# Patient Record
Sex: Female | Born: 1965 | Race: White | Hispanic: No | Marital: Married | Smoking: Never smoker
Health system: Southern US, Community
[De-identification: ages and names within clinical notes are randomized; demographics above are authoritative.]

## PROBLEM LIST (undated history)

## (undated) DIAGNOSIS — T7840XA Allergy, unspecified, initial encounter: Secondary | ICD-10-CM

## (undated) DIAGNOSIS — R519 Headache, unspecified: Secondary | ICD-10-CM

## (undated) DIAGNOSIS — K219 Gastro-esophageal reflux disease without esophagitis: Secondary | ICD-10-CM

## (undated) DIAGNOSIS — R51 Headache: Secondary | ICD-10-CM

## (undated) DIAGNOSIS — I10 Essential (primary) hypertension: Secondary | ICD-10-CM

## (undated) DIAGNOSIS — Z87442 Personal history of urinary calculi: Secondary | ICD-10-CM

## (undated) DIAGNOSIS — R7303 Prediabetes: Secondary | ICD-10-CM

## (undated) DIAGNOSIS — F419 Anxiety disorder, unspecified: Secondary | ICD-10-CM

## (undated) DIAGNOSIS — F32A Depression, unspecified: Secondary | ICD-10-CM

## (undated) DIAGNOSIS — Z800 Family history of malignant neoplasm of digestive organs: Secondary | ICD-10-CM

## (undated) DIAGNOSIS — N200 Calculus of kidney: Secondary | ICD-10-CM

## (undated) DIAGNOSIS — F329 Major depressive disorder, single episode, unspecified: Secondary | ICD-10-CM

## (undated) DIAGNOSIS — E669 Obesity, unspecified: Secondary | ICD-10-CM

## (undated) DIAGNOSIS — Z8042 Family history of malignant neoplasm of prostate: Secondary | ICD-10-CM

## (undated) DIAGNOSIS — R635 Abnormal weight gain: Secondary | ICD-10-CM

## (undated) DIAGNOSIS — G43909 Migraine, unspecified, not intractable, without status migrainosus: Secondary | ICD-10-CM

## (undated) DIAGNOSIS — C50919 Malignant neoplasm of unspecified site of unspecified female breast: Secondary | ICD-10-CM

## (undated) DIAGNOSIS — M1990 Unspecified osteoarthritis, unspecified site: Secondary | ICD-10-CM

## (undated) HISTORY — DX: Calculus of kidney: N20.0

## (undated) HISTORY — DX: Major depressive disorder, single episode, unspecified: F32.9

## (undated) HISTORY — DX: Family history of malignant neoplasm of prostate: Z80.42

## (undated) HISTORY — PX: PILONIDAL CYST EXCISION: SHX744

## (undated) HISTORY — PX: INCONTINENCE SURGERY: SHX676

## (undated) HISTORY — DX: Family history of malignant neoplasm of digestive organs: Z80.0

## (undated) HISTORY — PX: UPPER GI ENDOSCOPY: SHX6162

## (undated) HISTORY — DX: Essential (primary) hypertension: I10

## (undated) HISTORY — DX: Depression, unspecified: F32.A

## (undated) HISTORY — DX: Malignant neoplasm of unspecified site of unspecified female breast: C50.919

## (undated) HISTORY — DX: Migraine, unspecified, not intractable, without status migrainosus: G43.909

## (undated) HISTORY — DX: Anxiety disorder, unspecified: F41.9

## (undated) HISTORY — DX: Allergy, unspecified, initial encounter: T78.40XA

## (undated) HISTORY — DX: Unspecified osteoarthritis, unspecified site: M19.90

## (undated) HISTORY — DX: Headache: R51

## (undated) HISTORY — DX: Headache, unspecified: R51.9

## (undated) HISTORY — DX: Abnormal weight gain: R63.5

---

## 2003-08-16 ENCOUNTER — Encounter: Payer: Self-pay | Admitting: Internal Medicine

## 2004-03-13 ENCOUNTER — Ambulatory Visit: Payer: Self-pay | Admitting: Family Medicine

## 2004-04-02 ENCOUNTER — Ambulatory Visit: Payer: Self-pay | Admitting: Family Medicine

## 2004-09-21 ENCOUNTER — Ambulatory Visit: Payer: Self-pay | Admitting: Internal Medicine

## 2005-11-16 ENCOUNTER — Ambulatory Visit: Payer: Self-pay | Admitting: Obstetrics and Gynecology

## 2005-12-29 ENCOUNTER — Ambulatory Visit: Payer: Self-pay | Admitting: Family Medicine

## 2006-01-21 ENCOUNTER — Encounter: Payer: Self-pay | Admitting: Internal Medicine

## 2006-03-28 ENCOUNTER — Ambulatory Visit: Payer: Self-pay | Admitting: Internal Medicine

## 2006-03-28 LAB — CONVERTED CEMR LAB
ALT: 12 units/L (ref 0–40)
AST: 13 units/L (ref 0–37)
Albumin: 3.7 g/dL (ref 3.5–5.2)
Alkaline Phosphatase: 55 units/L (ref 39–117)
Amylase: 66 units/L (ref 27–131)
BUN: 11 mg/dL (ref 6–23)
Basophils Absolute: 0.1 10*3/uL (ref 0.0–0.1)
Basophils Relative: 0.7 % (ref 0.0–1.0)
Bilirubin, Direct: 0.1 mg/dL (ref 0.0–0.3)
CO2: 27 meq/L (ref 19–32)
Calcium: 9.2 mg/dL (ref 8.4–10.5)
Chloride: 105 meq/L (ref 96–112)
Creatinine, Ser: 0.8 mg/dL (ref 0.4–1.2)
Eosinophils Absolute: 0.1 10*3/uL (ref 0.0–0.6)
Eosinophils Relative: 1 % (ref 0.0–5.0)
GFR calc Af Amer: 102 mL/min
GFR calc non Af Amer: 84 mL/min
Glucose, Bld: 117 mg/dL — ABNORMAL HIGH (ref 70–99)
HCT: 37.2 % (ref 36.0–46.0)
Hemoglobin: 13.1 g/dL (ref 12.0–15.0)
Lipase: 29 units/L (ref 11.0–59.0)
Lymphocytes Relative: 18.8 % (ref 12.0–46.0)
MCHC: 35.3 g/dL (ref 30.0–36.0)
MCV: 87.3 fL (ref 78.0–100.0)
Monocytes Absolute: 1.3 10*3/uL — ABNORMAL HIGH (ref 0.2–0.7)
Monocytes Relative: 13 % — ABNORMAL HIGH (ref 3.0–11.0)
Neutro Abs: 6.5 10*3/uL (ref 1.4–7.7)
Neutrophils Relative %: 66.5 % (ref 43.0–77.0)
Platelets: 180 10*3/uL (ref 150–400)
Potassium: 4.3 meq/L (ref 3.5–5.1)
RBC: 4.27 M/uL (ref 3.87–5.11)
RDW: 12.6 % (ref 11.5–14.6)
Sodium: 138 meq/L (ref 135–145)
Total Bilirubin: 0.7 mg/dL (ref 0.3–1.2)
Total Protein: 6.8 g/dL (ref 6.0–8.3)
WBC: 9.8 10*3/uL (ref 4.5–10.5)

## 2006-03-29 ENCOUNTER — Encounter: Payer: Self-pay | Admitting: Internal Medicine

## 2006-03-29 ENCOUNTER — Ambulatory Visit: Payer: Self-pay | Admitting: Cardiology

## 2006-03-31 ENCOUNTER — Ambulatory Visit: Payer: Self-pay | Admitting: Internal Medicine

## 2006-03-31 LAB — CONVERTED CEMR LAB
Amylase: 77 units/L (ref 27–131)
Basophils Absolute: 0.1 10*3/uL (ref 0.0–0.1)
Basophils Relative: 0.7 % (ref 0.0–1.0)
Eosinophils Absolute: 0.2 10*3/uL (ref 0.0–0.6)
Eosinophils Relative: 1.9 % (ref 0.0–5.0)
HCT: 38.6 % (ref 36.0–46.0)
Hemoglobin: 12.9 g/dL (ref 12.0–15.0)
Lipase: 24 units/L (ref 11.0–59.0)
Lymphocytes Relative: 28.3 % (ref 12.0–46.0)
MCHC: 33.5 g/dL (ref 30.0–36.0)
MCV: 88.8 fL (ref 78.0–100.0)
Monocytes Absolute: 0.8 10*3/uL — ABNORMAL HIGH (ref 0.2–0.7)
Monocytes Relative: 9.9 % (ref 3.0–11.0)
Neutro Abs: 4.6 10*3/uL (ref 1.4–7.7)
Neutrophils Relative %: 59.2 % (ref 43.0–77.0)
Platelets: 259 10*3/uL (ref 150–400)
RBC: 4.35 M/uL (ref 3.87–5.11)
RDW: 12.2 % (ref 11.5–14.6)
Sed Rate: 86 mm/hr — ABNORMAL HIGH (ref 0–25)
WBC: 7.9 10*3/uL (ref 4.5–10.5)

## 2006-04-01 ENCOUNTER — Ambulatory Visit: Payer: Self-pay | Admitting: Internal Medicine

## 2006-04-06 ENCOUNTER — Ambulatory Visit: Payer: Self-pay | Admitting: Internal Medicine

## 2006-04-06 LAB — CONVERTED CEMR LAB: Sed Rate: 55 mm/hr — ABNORMAL HIGH (ref 0–25)

## 2006-04-07 ENCOUNTER — Encounter: Payer: Self-pay | Admitting: Internal Medicine

## 2006-04-07 ENCOUNTER — Ambulatory Visit: Payer: Self-pay | Admitting: *Deleted

## 2006-06-07 ENCOUNTER — Ambulatory Visit: Payer: Self-pay | Admitting: Internal Medicine

## 2006-06-07 DIAGNOSIS — J019 Acute sinusitis, unspecified: Secondary | ICD-10-CM | POA: Insufficient documentation

## 2006-06-07 LAB — CONVERTED CEMR LAB
Glucose, Urine, Semiquant: NEGATIVE
Ketones, urine, test strip: NEGATIVE
Nitrite: POSITIVE
Protein, U semiquant: NEGATIVE
Specific Gravity, Urine: <1.005
Urobilinogen, UA: 4
pH: 8

## 2006-12-07 ENCOUNTER — Ambulatory Visit: Payer: Self-pay | Admitting: Obstetrics and Gynecology

## 2007-01-31 ENCOUNTER — Ambulatory Visit: Payer: Self-pay | Admitting: Obstetrics and Gynecology

## 2007-03-07 ENCOUNTER — Encounter: Payer: Self-pay | Admitting: Internal Medicine

## 2007-04-28 ENCOUNTER — Ambulatory Visit: Payer: Self-pay | Admitting: Urology

## 2007-05-24 ENCOUNTER — Ambulatory Visit: Payer: Self-pay | Admitting: Urology

## 2007-07-25 ENCOUNTER — Ambulatory Visit: Payer: Self-pay | Admitting: Gastroenterology

## 2007-07-25 ENCOUNTER — Encounter: Payer: Self-pay | Admitting: Internal Medicine

## 2007-08-07 ENCOUNTER — Encounter: Payer: Self-pay | Admitting: Internal Medicine

## 2007-08-07 ENCOUNTER — Ambulatory Visit: Payer: Self-pay | Admitting: Urology

## 2008-07-23 ENCOUNTER — Ambulatory Visit: Payer: Self-pay | Admitting: Obstetrics and Gynecology

## 2008-07-23 ENCOUNTER — Ambulatory Visit: Payer: Self-pay | Admitting: Urology

## 2008-07-23 ENCOUNTER — Encounter: Payer: Self-pay | Admitting: Internal Medicine

## 2008-08-23 ENCOUNTER — Ambulatory Visit: Payer: Self-pay | Admitting: Obstetrics and Gynecology

## 2008-10-07 ENCOUNTER — Telehealth (INDEPENDENT_AMBULATORY_CARE_PROVIDER_SITE_OTHER): Payer: Self-pay | Admitting: *Deleted

## 2009-01-29 ENCOUNTER — Emergency Department: Payer: Self-pay | Admitting: Emergency Medicine

## 2009-05-23 ENCOUNTER — Encounter: Payer: Self-pay | Admitting: Internal Medicine

## 2009-06-02 ENCOUNTER — Ambulatory Visit: Payer: Self-pay | Admitting: Urology

## 2009-06-17 ENCOUNTER — Ambulatory Visit: Payer: Self-pay | Admitting: Urology

## 2009-06-30 ENCOUNTER — Ambulatory Visit: Payer: Self-pay | Admitting: Urology

## 2009-07-03 ENCOUNTER — Ambulatory Visit: Payer: Self-pay | Admitting: Urology

## 2009-07-18 ENCOUNTER — Ambulatory Visit: Payer: Self-pay | Admitting: Urology

## 2009-07-18 ENCOUNTER — Encounter: Payer: Self-pay | Admitting: Internal Medicine

## 2010-03-01 ENCOUNTER — Encounter: Payer: Self-pay | Admitting: Internal Medicine

## 2010-03-10 NOTE — Letter (Signed)
Summary: [facility]   Imported By: Lanelle Bal 06/03/2009 10:57:22  _____________________________________________________________________  External Attachment:    Type:   Image     Comment:   External Document  Appended Document: [facility] doing KUB to see about stone position still on preventative antibiotic monthly at end of cycle

## 2010-03-10 NOTE — Letter (Signed)
Summary: [facility]   Imported By: Lanelle Bal 07/24/2009 14:01:53  _____________________________________________________________________  External Attachment:    Type:   Image     Comment:   External Document  Appended Document: [facility] follow up from lithotripsy 5/26 stone seems to be gone

## 2010-03-10 NOTE — Letter (Signed)
Summary: [facility]   Imported By: Lanelle Bal 07/26/2008 11:34:34  _____________________________________________________________________  External Attachment:    Type:   Image     Comment:   External Document  Appended Document: [facility] rx for cystitis discussed lithotripsy for enlarging stone--she is considering

## 2010-03-12 ENCOUNTER — Other Ambulatory Visit: Payer: Self-pay | Admitting: Internal Medicine

## 2010-03-12 ENCOUNTER — Encounter: Payer: Self-pay | Admitting: Internal Medicine

## 2010-03-12 ENCOUNTER — Ambulatory Visit (INDEPENDENT_AMBULATORY_CARE_PROVIDER_SITE_OTHER): Payer: BC Managed Care – PPO | Admitting: Internal Medicine

## 2010-03-12 DIAGNOSIS — R030 Elevated blood-pressure reading, without diagnosis of hypertension: Secondary | ICD-10-CM

## 2010-03-12 DIAGNOSIS — I10 Essential (primary) hypertension: Secondary | ICD-10-CM | POA: Insufficient documentation

## 2010-03-13 LAB — HEPATIC FUNCTION PANEL
ALT: 17 U/L (ref 0–35)
AST: 23 U/L (ref 0–37)
Albumin: 3.7 g/dL (ref 3.5–5.2)
Alkaline Phosphatase: 63 U/L (ref 39–117)
Bilirubin, Direct: 0.1 mg/dL (ref 0.0–0.3)
Total Bilirubin: 0.2 mg/dL — ABNORMAL LOW (ref 0.3–1.2)
Total Protein: 6.7 g/dL (ref 6.0–8.3)

## 2010-03-13 LAB — CBC WITH DIFFERENTIAL/PLATELET
Basophils Absolute: 0.1 10*3/uL (ref 0.0–0.1)
Basophils Relative: 0.6 % (ref 0.0–3.0)
Eosinophils Absolute: 0.2 10*3/uL (ref 0.0–0.7)
Eosinophils Relative: 1.8 % (ref 0.0–5.0)
HCT: 35.6 % — ABNORMAL LOW (ref 36.0–46.0)
Hemoglobin: 12.2 g/dL (ref 12.0–15.0)
Lymphocytes Relative: 22.6 % (ref 12.0–46.0)
Lymphs Abs: 2.5 10*3/uL (ref 0.7–4.0)
MCHC: 34.2 g/dL (ref 30.0–36.0)
MCV: 90.8 fl (ref 78.0–100.0)
Monocytes Absolute: 0.7 10*3/uL (ref 0.1–1.0)
Monocytes Relative: 6.7 % (ref 3.0–12.0)
Neutro Abs: 7.5 10*3/uL (ref 1.4–7.7)
Neutrophils Relative %: 68.3 % (ref 43.0–77.0)
Platelets: 201 10*3/uL (ref 150.0–400.0)
RBC: 3.93 Mil/uL (ref 3.87–5.11)
RDW: 13.7 % (ref 11.5–14.6)
WBC: 11 10*3/uL — ABNORMAL HIGH (ref 4.5–10.5)

## 2010-03-13 LAB — RENAL FUNCTION PANEL
Albumin: 3.7 g/dL (ref 3.5–5.2)
BUN: 11 mg/dL (ref 6–23)
CO2: 27 mEq/L (ref 19–32)
Calcium: 9.1 mg/dL (ref 8.4–10.5)
Chloride: 106 mEq/L (ref 96–112)
Creatinine, Ser: 0.7 mg/dL (ref 0.4–1.2)
GFR: 104.62 mL/min (ref >60.00)
Glucose, Bld: 88 mg/dL (ref 70–99)
Phosphorus: 3.3 mg/dL (ref 2.3–4.6)
Potassium: 4.1 mEq/L (ref 3.5–5.1)
Sodium: 140 mEq/L (ref 135–145)

## 2010-03-13 LAB — MICROALBUMIN / CREATININE URINE RATIO
Creatinine,U: 122.2 mg/dL
Microalb Creat Ratio: 2.3 mg/g (ref 0.0–30.0)
Microalb, Ur: 2.8 mg/dL — ABNORMAL HIGH (ref 0.0–1.9)

## 2010-03-13 LAB — TSH: TSH: 0.57 u[IU]/mL (ref 0.35–5.50)

## 2010-03-18 NOTE — Assessment & Plan Note (Signed)
Summary: CHECK BP/CLE  BCBS   Vital Signs:  Patient profile:   45 year old female Height:      67 inches Weight:      147 pounds BMI:     23.11 Temp:     98.9 degrees F oral Pulse rate:   94 / minute Pulse rhythm:   regular BP sitting:   148 / 74  (left arm) Cuff size:   regular  Vitals Entered By: Mervin Hack CMA Duncan Dull) (March 12, 2010 4:29 PM) CC: check blood pressure   History of Present Illness: Here with niece Elease Hashimoto who is a Media planner off imipramine and propranolol (migraine prophylaxis) they weren't working great but kept her from daily headaches had headache for 5 days straight Tried accupuncture at [facility] BP then 153/93---thought it might have been pain related Headache relieved after the treatment and none since that visit over 2 weeks ago Going today for her 4th visit  Has been checking at chiropractor and she has been checking also 138/89--- 153/96 No headaches!! Feels "foggy" but not dizzy Thinks she looks tired and eyes look funny  Not sleeping well Lots of pressure now Husband is 14 years younger Trying to conceive but had tubes tied OB in [place] wasn't excited about reversal Trying in vitro instead has started lupron injections and then last week estrogen injections Hopes to do transfer next week  Feels quivery inside--not sure if it is the BP or nerves  Has had some aching in left forearm--has kept her up some in past 5 nights occ pain in elbow--like tendonitis Noted it during the day but then occ worsened at night  Preventive Screening-Counseling & Management  Alcohol-Tobacco     Smoking Status: never  Allergies: 1)  * Sulfa (Sulfonamides) Group 2)  * Sulfa (Sulfonamides) Group 3)  Topamax Sprinkle (Topiramate)  Past History:  Past Medical History: Migraines  Past Surgical History: Cesearean section x 2 Bladder suspension Kidney stone--basket removal  Family History: Dad has CAD, HTN Mom had stroke,  colon cancer  1 brother 2 sisters--- 1 with CAD DM in mat GM No breast cancer  Social History: Divorced then remarried 3 daughters  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  Occ alcohol  Review of Systems       The patient complains of dyspnea on exertion.  The patient denies chest pain, syncope, peripheral edema, abdominal pain, severe indigestion/heartburn, hematuria, muscle weakness, difficulty walking, and depression.         No sig fitness efforts weight is up 20# Has noted DOE going up 2 flights of steps at school  Physical Exam  General:  alert and normal appearance.   Eyes:  pupils equal, pupils round, pupils reactive to light, and no optic disk abnormalities.   Neck:  supple, no masses, no thyromegaly, no carotid bruits, and no cervical lymphadenopathy.   Lungs:  normal respiratory effort, no intercostal retractions, no accessory muscle use, and normal breath sounds.   Heart:  normal rate, regular rhythm, no murmur, and no gallop.   Abdomen:  soft and non-tender.   No bruits Msk:  no joint tenderness and no joint swelling.   No tenderness or restriction to left arm motion Extremities:  no edema Psych:  normally interactive, good eye contact, not depressed appearing, and slightly anxious.     Impression & Recommendations:  Problem # 1:  ELEVATED BLOOD PRESSURE WITHOUT DIAGNOSIS OF HYPERTENSION (ICD-796.2) Assessment New  Repeat measurement in  right arm is 132/84  Has multiple complicating factors---withdrawl of migraine meds (including inderal), hormonal Rx BP okay by me would be good to check routine labs--- plus urine microal no Rx now if persistent elevations of BP--Rx would be different is she pregnant Not clear that meds are needed at all  Orders: Venipuncture (62229) TLB-Renal Function Panel (80069-RENAL) TLB-CBC Platelet - w/Differential (85025-CBCD) TLB-Hepatic/Liver Function Pnl (80076-HEPATIC) TLB-TSH (Thyroid Stimulating  Hormone) (84443-TSH) TLB-A1C / Hgb A1C (Glycohemoglobin) (83036-A1C)  Patient Instructions: 1)  Please schedule a follow-up appointment as needed .    Orders Added: 1)  New Patient Level IV [99204] 2)  Venipuncture [36415] 3)  TLB-Renal Function Panel [80069-RENAL] 4)  TLB-CBC Platelet - w/Differential [85025-CBCD] 5)  TLB-Hepatic/Liver Function Pnl [80076-HEPATIC] 6)  TLB-TSH (Thyroid Stimulating Hormone) [84443-TSH] 7)  TLB-A1C / Hgb A1C (Glycohemoglobin) [83036-A1C]    Prior Medications: Current Allergies (reviewed today): * SULFA (SULFONAMIDES) GROUP * SULFA (SULFONAMIDES) GROUP TOPAMAX SPRINKLE (TOPIRAMATE)  Colonoscopy  Procedure date:  08/08/2009  Findings:      Results: Normal.    Appended Document: CHECK BP/CLE  BCBS

## 2010-03-26 NOTE — Op Note (Signed)
[facility]   Imported By: Beau Fanny 03/17/2010 09:23:17  _____________________________________________________________________  External Attachment:    Type:   Image     Comment:   External Document

## 2010-03-26 NOTE — Letter (Signed)
Summary: Dr[facility]Marshall Jenita [facility],Note  Dr[facility]Marshall Jenita [facility],Note   Imported By: Beau Fanny 03/17/2010 09:47:36  _____________________________________________________________________  External Attachment:    Type:   Image     Comment:   External Document

## 2011-01-15 ENCOUNTER — Observation Stay: Payer: Self-pay

## 2011-01-16 ENCOUNTER — Observation Stay: Payer: Self-pay

## 2011-01-17 ENCOUNTER — Observation Stay: Payer: Self-pay

## 2011-01-26 ENCOUNTER — Observation Stay: Payer: Self-pay | Admitting: Obstetrics and Gynecology

## 2011-02-08 ENCOUNTER — Observation Stay: Payer: Self-pay

## 2011-02-08 ENCOUNTER — Ambulatory Visit: Payer: Self-pay | Admitting: Obstetrics and Gynecology

## 2011-02-10 ENCOUNTER — Inpatient Hospital Stay: Payer: Self-pay | Admitting: Obstetrics and Gynecology

## 2011-02-11 LAB — HEMATOCRIT: HCT: 28 % — ABNORMAL LOW (ref 35.0–47.0)

## 2011-03-18 ENCOUNTER — Ambulatory Visit: Payer: Self-pay | Admitting: Neurology

## 2011-11-09 ENCOUNTER — Ambulatory Visit: Payer: Self-pay | Admitting: Obstetrics and Gynecology

## 2013-02-08 HISTORY — PX: COLONOSCOPY: SHX174

## 2013-02-23 ENCOUNTER — Ambulatory Visit: Payer: BC Managed Care – PPO | Admitting: Internal Medicine

## 2013-03-09 ENCOUNTER — Encounter: Payer: Self-pay | Admitting: Internal Medicine

## 2013-03-09 ENCOUNTER — Ambulatory Visit (INDEPENDENT_AMBULATORY_CARE_PROVIDER_SITE_OTHER): Payer: BC Managed Care – PPO | Admitting: Internal Medicine

## 2013-03-09 VITALS — BP 112/74 | HR 78 | Temp 98.2°F | Resp 18 | Ht 64.5 in | Wt 152.0 lb

## 2013-03-09 DIAGNOSIS — Z8669 Personal history of other diseases of the nervous system and sense organs: Secondary | ICD-10-CM

## 2013-03-09 DIAGNOSIS — R030 Elevated blood-pressure reading, without diagnosis of hypertension: Secondary | ICD-10-CM

## 2013-03-09 DIAGNOSIS — F411 Generalized anxiety disorder: Secondary | ICD-10-CM

## 2013-03-09 MED ORDER — ESCITALOPRAM OXALATE 20 MG PO TABS: 20.0000 mg | ORAL_TABLET | Freq: Every day | ORAL | Status: DC

## 2013-03-09 NOTE — Progress Notes (Signed)
Patient ID: Tiffany Acevedo, female   DOB: 03-28-1965, 48 y.o.   MRN: [id number]  Patient Active Problem List   Diagnosis Date Noted  . History of migraine headaches 03/11/2013  . Generalized anxiety disorder 03/11/2013  . Hypertension 03/12/2010    Subjective:  CC:   Chief Complaint  Patient presents with  . Establish Care    HPI:   Tiffany Acevedo is a 48 y.o. female who presents as a new patient to establish primary care with the chief complaint of   Hypertension  Since pregnancy with 2nd child in vitro fertilization, 2nd attempt with purchased eggs  after tubal ligation at age 89!!  Had a rough period   Med changed 6 months ago  Dr.  Amalia Hailey moving to [place] and Silvio Pate is at [facility].  Annual exam wants to change to me.,  Last exam 2014 sept. jhad her annual mammogram    Had A UTI last week  Uses imipramine for migraine prevention,    Worries a lot  Mother had colon CA,  Scheduled Feb 12   Anxiety,  Generalized,  And chronic.  NO prior trials of SSRIs.  Constantly worrying that something is wrong with her,  Chronic left sided flank and back pain aggravated by sleeping on left side.   Evans thinks it is scar tissue from c section   Wants to lose 26 lbs  Up at 6,  Out of house  by 7:30 ,  Works till 5:00 picks up baby by 5:15 and dtr,  Home by 6:00.  48 yr old has homework.  dtr in bed by 9:30 grades papers ,  In bed by  10:30 or 11:00   No past medical history on file.  No past surgical history on file.  No family history on file.  History   Social History  . Marital Status: Married    Spouse Name: N/A    Number of Children: N/A  . Years of Education: N/A   Occupational History  . Not on file.   Social History Main Topics  . Smoking status: Never Smoker   . Smokeless tobacco: Never Used  . Alcohol Use: 0.0 oz/week     Comment: glass of wine occassionally  . Drug Use: No  . Sexual Activity: Yes    Partners: Male    Birth Control/ Protection: Surgical    Other Topics Concern  . Not on file   Social History Narrative  . No narrative on file   Allergies  Allergen Reactions  . Sulfonamide Derivatives     REACTION: unspecified      Review of Systems:   The remainder of the review of systems was negative except those addressed in the HPI.       Objective:  BP 112/74  Pulse 78  Temp(Src) 98.2 F (36.8 C) (Oral)  Resp 18  Ht 5' 4.5" (1.638 m)  Wt 152 lb (68.947 kg)  BMI 25.70 kg/m2  SpO2 99%  LMP 02/12/2013  General appearance: alert, cooperative and appears stated age Ears: normal TM's and external ear canals both ears Throat: lips, mucosa, and tongue normal; teeth and gums normal Neck: no adenopathy, no carotid bruit, supple, symmetrical, trachea midline and thyroid not enlarged, symmetric, no tenderness/mass/nodules Back: symmetric, no curvature. ROM normal. No CVA tenderness. Lungs: clear to auscultation bilaterally Heart: regular rate and rhythm, S1, S2 normal, no murmur, click, rub or gallop Abdomen: soft, non-tender; bowel sounds normal; no masses,  no organomegaly Pulses: 2+  and symmetric Skin: Skin color, texture, turgor normal. No rashes or lesions Lymph nodes: Cervical, supraclavicular, and axillary nodes normal.  Assessment and Plan:  Hypertension Well-controlled on current Medications. Return for fasting lipids and renal function.  Lab Results  Component Value Date   CREATININE 0.7 03/12/2010      History of migraine headaches Managed prophylactically with imipramine at bedtime. We discussed the possible interaction of tricyclic antidepressants with SSRIs  Which we are starting today for generalized anxiety. We started her on a very low dose of Lexapro and follow her symptoms.  Generalized anxiety disorder No prior trial of SSRIs. Discussed trial of Lexapro today; we will start with 5 mg daily. He is no history of manic behavior or depression.   Updated Medication List Outpatient Encounter  Prescriptions as of 03/09/2013  Medication Sig  . bisoprolol-hydrochlorothiazide (ZIAC) 5-6.25 MG per tablet Take 1 tablet by mouth daily.   Marland Kitchen etodolac (LODINE) 500 MG tablet Take 500 mg by mouth 2 (two) times daily as needed.  Marland Kitchen imipramine (TOFRANIL) 50 MG tablet Take 50 mg by mouth at bedtime.   . polyethylene glycol powder (GLYCOLAX/MIRALAX) powder   . rizatriptan (MAXALT) 10 MG tablet Take 10 mg by mouth as needed for migraine.   Marland Kitchen escitalopram (LEXAPRO) 20 MG tablet Take 1 tablet (20 mg total) by mouth daily.     No orders of the defined types were placed in this encounter.    No Follow-up on file.

## 2013-03-09 NOTE — Patient Instructions (Addendum)
Trial of lexapro(daily) for generalized anxiety. Start with 1/2 tablet daily for the first week, then increase to whole tablet    This is  One version of a  "Low GI"  Diet:  It will still lower your blood sugars and allow you to lose 4 to 8  lbs  per month if you follow it carefully.  Your goal with exercise is a minimum of 30 minutes of aerobic exercise 5 days per week (Walking does not count once it becomes easy!)    All of the foods can be found at grocery stores and in bulk at [employer].  The Atkins protein bars and shakes are available in more varieties at Target, [employer] and [place].     7 AM Breakfast:  Choose from the following:  Low carbohydrate Protein  Shakes (I recommend the EAS AdvantEdge "Carb Control" shakes  Or the low carb shakes by Atkins.    2.5 carbs   Arnold's "Sandwhich Thin"toasted  w/ peanut butter (no jelly: about 20 net carbs  "Bagel Thin" with cream cheese and salmon: about 20 carbs   a scrambled egg/bacon/cheese burrito made with Mission's "carb balance" whole wheat tortilla  (about 10 net carbs )   Avoid cereal and bananas, oatmeal and cream of wheat and grits. They are loaded with carbohydrates!   10 AM: high protein snack  Protein bar by Atkins (the snack size, under 200 cal, usually < 6 net carbs).    A stick of cheese:  Around 1 carb,  100 cal     Dannon Light n Fit [place] Yogurt  (80 cal, 8 carbs)  Other so called "protein bars" and Greek yogurts tend to be loaded with carbohydrates.  Remember, in food advertising, the word "energy" is synonymous for " carbohydrate."  Lunch:   A Sandwich using the bread choices listed, Can use any  Eggs,  lunchmeat, grilled meat or canned tuna), avocado, regular mayo/mustard  and cheese.  A Salad using blue cheese, ranch,  Goddess or vinagrette,  No croutons or "confetti" and no "candied nuts" but regular nuts OK.   No pretzels or chips.  Pickles and miniature sweet peppers are a good low carb alternative that provide a  "crunch"  The bread is the only source of carbohydrate in a sandwich and  can be decreased by trying some of these alternatives to traditional loaf bread  Joseph's makes a pita bread and a flat bread that are 50 cal and 4 net carbs available at [place] and [place].  This can be toasted to use with hummous as well  Toufayan makes a low carb flatbread that's 100 cal and 9 net carbs available at [employer] and [employer]'s makes 2 sizes of  Low carb whole wheat tortilla  (The large one is 210 cal and 6 net carbs) Avoid "Low fat dressings, as well as Barry Brunner and Forada dressings They are loaded with sugar!   3 PM/ Mid day  Snack:  Consider  1 ounce of  almonds, walnuts, pistachios, pecans, peanuts,  Macadamia nuts or a nut medley.  Avoid "granola"; the dried cranberries and raisins are loaded with carbohydrates. Mixed nuts as long as there are no raisins,  cranberries or dried fruit.     6 PM  Dinner:     Meat/fowl/fish with a green salad, and either broccoli, cauliflower, green beans, spinach, brussel sprouts or  Lima beans. DO NOT BREAD THE PROTEIN!!      There is  a low carb pasta by Dreamfield's that is acceptable and tastes great: only 5 digestible carbs/serving.( All grocery stores but BJs carry it )  Try Hurley Cisco Angelo's chicken piccata or chicken or eggplant parm over low carb pasta.(Lowes and BJs)   Marjory Lies Sanchez's "Carnitas" (pulled pork, no sauce,  0 carbs) or his beef pot roast to make a dinner burrito (at [employer]J's)  Pesto over low carb pasta (bj's sells a good quality pesto in the center refrigerated section of the deli   Whole wheat pasta is still full of digestible carbs and  Not as low in glycemic index as Dreamfield's.   Brown rice is still rice,  So skip the rice and noodles if you eat [place] or [place] (or at least limit to 1/2 cup)  9 PM snack :   Breyer's "low carb" fudgsicle or  ice cream bar (Carb Smart line), or  Weight Watcher's ice cream bar , or another "no sugar added"  ice cream;  a serving of fresh berries/cherries with whipped cream   Cheese or DANNON'S LlGHT N FIT G[place] YOGURT  Avoid bananas, pineapple, grapes  and watermelon on a regular basis because they are high in sugar.  THINK OF THEM AS DESSERT  Remember that snack Substitutions should be less than 10 NET carbs per serving and meals < 20 carbs. Remember to subtract fiber grams to get the "net carbs."

## 2013-03-09 NOTE — Progress Notes (Signed)
Pre-visit discussion using our clinic review tool. No additional management support is needed unless otherwise documented below in the visit note.  

## 2013-03-11 DIAGNOSIS — F411 Generalized anxiety disorder: Secondary | ICD-10-CM | POA: Insufficient documentation

## 2013-03-11 DIAGNOSIS — G43909 Migraine, unspecified, not intractable, without status migrainosus: Secondary | ICD-10-CM | POA: Insufficient documentation

## 2013-03-11 NOTE — Assessment & Plan Note (Signed)
Managed prophylactically with imipramine at bedtime. We discussed the possible interaction of tricyclic antidepressants with SSRIs  Which we are starting today for generalized anxiety. We started her on a very low dose of Lexapro and follow her symptoms.

## 2013-03-11 NOTE — Assessment & Plan Note (Signed)
No prior trial of SSRIs. Discussed trial of Lexapro today; we will start with 5 mg daily. He is no history of manic behavior or depression.

## 2013-03-11 NOTE — Assessment & Plan Note (Signed)
Well-controlled on current Medications. Return for fasting lipids and renal function.  Lab Results  Component Value Date   CREATININE 0.7 03/12/2010

## 2013-03-22 LAB — HM COLONOSCOPY

## 2013-06-08 ENCOUNTER — Ambulatory Visit: Payer: BC Managed Care – PPO | Admitting: Internal Medicine

## 2013-06-22 ENCOUNTER — Ambulatory Visit (INDEPENDENT_AMBULATORY_CARE_PROVIDER_SITE_OTHER): Payer: BC Managed Care – PPO | Admitting: Internal Medicine

## 2013-06-22 ENCOUNTER — Encounter: Payer: Self-pay | Admitting: Internal Medicine

## 2013-06-22 VITALS — BP 118/78 | HR 62 | Temp 98.9°F | Resp 16 | Wt 145.5 lb

## 2013-06-22 DIAGNOSIS — R6882 Decreased libido: Secondary | ICD-10-CM

## 2013-06-22 DIAGNOSIS — F411 Generalized anxiety disorder: Secondary | ICD-10-CM

## 2013-06-22 DIAGNOSIS — R5383 Other fatigue: Secondary | ICD-10-CM

## 2013-06-22 DIAGNOSIS — D235 Other benign neoplasm of skin of trunk: Secondary | ICD-10-CM

## 2013-06-22 DIAGNOSIS — I10 Essential (primary) hypertension: Secondary | ICD-10-CM

## 2013-06-22 DIAGNOSIS — D225 Melanocytic nevi of trunk: Secondary | ICD-10-CM

## 2013-06-22 DIAGNOSIS — E785 Hyperlipidemia, unspecified: Secondary | ICD-10-CM

## 2013-06-22 DIAGNOSIS — D72829 Elevated white blood cell count, unspecified: Secondary | ICD-10-CM

## 2013-06-22 DIAGNOSIS — R5381 Other malaise: Secondary | ICD-10-CM

## 2013-06-22 DIAGNOSIS — F520 Hypoactive sexual desire disorder: Secondary | ICD-10-CM

## 2013-06-22 LAB — CBC WITH DIFFERENTIAL/PLATELET
Basophils Absolute: 0 10*3/uL (ref 0.0–0.1)
Basophils Relative: 0 % (ref 0–1)
Eosinophils Absolute: 0.2 10*3/uL (ref 0.0–0.7)
Eosinophils Relative: 2 % (ref 0–5)
HCT: 35.4 % — ABNORMAL LOW (ref 36.0–46.0)
Hemoglobin: 12.2 g/dL (ref 12.0–15.0)
Lymphocytes Relative: 28 % (ref 12–46)
Lymphs Abs: 3.5 10*3/uL (ref 0.7–4.0)
MCH: 28.4 pg (ref 26.0–34.0)
MCHC: 34.5 g/dL (ref 30.0–36.0)
MCV: 82.3 fL (ref 78.0–100.0)
Monocytes Absolute: 1 10*3/uL (ref 0.1–1.0)
Monocytes Relative: 8 % (ref 3–12)
Neutro Abs: 7.7 10*3/uL (ref 1.7–7.7)
Neutrophils Relative %: 62 % (ref 43–77)
Platelets: 252 10*3/uL (ref 150–400)
RBC: 4.3 MIL/uL (ref 3.87–5.11)
RDW: 13.6 % (ref 11.5–15.5)
WBC: 12.4 10*3/uL — ABNORMAL HIGH (ref 4.0–10.5)

## 2013-06-22 NOTE — Progress Notes (Signed)
Patient ID: Tiffany Acevedo, female   DOB: 18-Nov-1965, 48 y.o.   MRN: [id number]  Patient Active Problem List   Diagnosis Date Noted  . Lack of libido 06/24/2013  . History of migraine headaches 03/11/2013  . Generalized anxiety disorder 03/11/2013  . Hypertension 03/12/2010    Subjective:  CC:   Chief Complaint  Patient presents with  . Follow-up    3 month    HPI:   Tiffany Acevedo is a 48 y.o. female who presents for Follow up on chronic condition including hypertension and GAD.  She has not been taking lexapro on a regular basis because she forgot about it.  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  No interest in sex ,  Reports persistent vaginal dryness.  Not menopausal yet     No past medical history on file.  No past surgical history on file.     The following portions of the patient's history were reviewed and updated as appropriate: Allergies, current medications, and problem list.    Review of Systems:   Patient denies headache, fevers, malaise, unintentional weight loss, skin rash, eye pain, sinus congestion and sinus pain, sore throat, dysphagia,  hemoptysis , cough, dyspnea, wheezing, chest pain, palpitations, orthopnea, edema, abdominal pain, nausea, melena, diarrhea, constipation, flank pain, dysuria, hematuria, urinary  Frequency, nocturia, numbness, tingling, seizures,  Focal weakness, Loss of consciousness,  Tremor, insomnia, depression, anxiety, and suicidal ideation.     History   Social History  . Marital Status: Married    Spouse Name: N/A    Number of Children: N/A  . Years of Education: N/A   Occupational History  . Not on file.   Social History Main Topics  . Smoking status: Never Smoker   . Smokeless  tobacco: Never Used  . Alcohol Use: 0.0 oz/week     Comment: glass of wine occassionally  . Drug Use: No  . Sexual Activity: Yes    Partners: Male    Birth Control/ Protection: Surgical   Other Topics Concern  . Not on file   Social History Narrative  . No narrative on file    Objective:  Filed Vitals:   06/22/13 1611  BP: 118/78  Pulse: 62  Temp: 98.9 F (37.2 C)  Resp: 16     General appearance: alert, cooperative and appears stated age Ears: normal TM's and external ear canals both ears Throat: lips, mucosa, and tongue normal; teeth and gums normal Neck: no adenopathy, no carotid bruit, supple, symmetrical, trachea midline and thyroid not enlarged, symmetric, no tenderness/mass/nodules Back: symmetric, no curvature. ROM normal. No CVA tenderness. Lungs: clear to auscultation bilaterally Heart: regular rate and rhythm, S1, S2 normal, no murmur, click, rub or gallop Abdomen: soft, non-tender; bowel sounds normal; no masses,  no organomegaly Pulses: 2+ and symmetric Skin: Skin color, texture, turgor normal. No rashes or lesions Lymph nodes: Cervical, supraclavicular, and axillary nodes normal.  Assessment and Plan:  Generalized anxiety disorder Managed without lexapro due to patient nonadherence.  Since the aggravating factor is about to be removed, (school year is ending), discussed forgoing medication at this time and consider resuming it a few weeks before school starts  Hypertension Well controlled on current regimen. Renal function stable,  no changes today.  Lab Results  Component Value Date   CREATININE 0.88 06/22/2013   Lab Results  Component Value Date   NA 138 06/22/2013   K 3.7 06/22/2013   CL 100 06/22/2013   CO2 23 06/22/2013     Lack of libido Long discussion about physical and emotional contributors.  Recommended trial of non hormonal lubricants.  Thyroid function normal.  Patient currently mesntruating regularly so not perimenopausal.  Lab  Results  Component Value Date   TSH 0.547 06/22/2013      Updated Medication List Outpatient Encounter Prescriptions as of 06/22/2013  Medication Sig  . bisoprolol-hydrochlorothiazide (ZIAC) 5-6.25 MG per tablet Take 1 tablet by mouth daily.   Marland Kitchen etodolac (LODINE) 500 MG tablet Take 500 mg by mouth 2 (two) times daily as needed.  Marland Kitchen imipramine (TOFRANIL) 50 MG tablet Take 50 mg by mouth at bedtime.   . rizatriptan (MAXALT) 10 MG tablet Take 10 mg by mouth as needed for migraine.   . polyethylene glycol powder (GLYCOLAX/MIRALAX) powder   . [DISCONTINUED] escitalopram (LEXAPRO) 20 MG tablet Take 1 tablet (20 mg total) by mouth daily.     Orders Placed This Encounter  Procedures  . Comprehensive metabolic panel  . TSH  . CBC with Differential  . Ambulatory referral to Dermatology    No Follow-up on file.

## 2013-06-22 NOTE — Progress Notes (Signed)
Pre-visit discussion using our clinic review tool. No additional management support is needed unless otherwise documented below in the visit note.  

## 2013-06-22 NOTE — Patient Instructions (Addendum)
I recommend trying non hormonal vaginal lubricants to help with your vaginal dryness  Astroglide KY vaginal suppository (lasts 3 days)  Premarin cream can be used if the non hormonal lubricants are not helpful  Call me if you decide to start it

## 2013-06-23 LAB — COMPREHENSIVE METABOLIC PANEL
ALT: 11 U/L (ref 0–35)
AST: 12 U/L (ref 0–37)
Albumin: 4.2 g/dL (ref 3.5–5.2)
Alkaline Phosphatase: 68 U/L (ref 39–117)
BUN: 17 mg/dL (ref 6–23)
CO2: 23 mEq/L (ref 19–32)
Calcium: 9.7 mg/dL (ref 8.4–10.5)
Chloride: 100 mEq/L (ref 96–112)
Creat: 0.88 mg/dL (ref 0.50–1.10)
Glucose, Bld: 71 mg/dL (ref 70–99)
Potassium: 3.7 mEq/L (ref 3.5–5.3)
Sodium: 138 mEq/L (ref 135–145)
Total Bilirubin: 0.3 mg/dL (ref 0.2–1.2)
Total Protein: 6.9 g/dL (ref 6.0–8.3)

## 2013-06-23 LAB — TSH: TSH: 0.547 u[IU]/mL (ref 0.350–4.500)

## 2013-06-24 DIAGNOSIS — F520 Hypoactive sexual desire disorder: Secondary | ICD-10-CM | POA: Insufficient documentation

## 2013-06-24 NOTE — Addendum Note (Signed)
Addended by: Crecencio Mc on: 06/24/2013 06:54 PM   Modules accepted: Orders

## 2013-06-24 NOTE — Assessment & Plan Note (Signed)
Managed without lexapro due to patient nonadherence.  Since the aggravating factor is about to be removed, (school year is ending), discussed forgoing medication at this time and consider resuming it a few weeks before school starts

## 2013-06-24 NOTE — Assessment & Plan Note (Signed)
Well controlled on current regimen. Renal function stable, no changes today.  Lab Results  Component Value Date   CREATININE 0.88 06/22/2013   Lab Results  Component Value Date   NA 138 06/22/2013   K 3.7 06/22/2013   CL 100 06/22/2013   CO2 23 06/22/2013

## 2013-06-24 NOTE — Assessment & Plan Note (Signed)
Long discussion about physical and emotional contributors.  Recommended trial of non hormonal lubricants.  Thyroid function normal.  Patient currently mesntruating regularly so not perimenopausal.  Lab Results  Component Value Date   TSH 0.547 06/22/2013

## 2013-06-25 ENCOUNTER — Encounter: Payer: Self-pay | Admitting: *Deleted

## 2013-10-29 DIAGNOSIS — G43119 Migraine with aura, intractable, without status migrainosus: Secondary | ICD-10-CM | POA: Insufficient documentation

## 2013-11-01 ENCOUNTER — Ambulatory Visit (INDEPENDENT_AMBULATORY_CARE_PROVIDER_SITE_OTHER): Payer: BC Managed Care – PPO | Admitting: Internal Medicine

## 2013-11-01 ENCOUNTER — Encounter: Payer: Self-pay | Admitting: Internal Medicine

## 2013-11-01 VITALS — BP 110/70 | HR 102 | Temp 98.9°F | Wt 148.0 lb

## 2013-11-01 DIAGNOSIS — J029 Acute pharyngitis, unspecified: Secondary | ICD-10-CM

## 2013-11-01 DIAGNOSIS — T3695XA Adverse effect of unspecified systemic antibiotic, initial encounter: Secondary | ICD-10-CM

## 2013-11-01 DIAGNOSIS — J020 Streptococcal pharyngitis: Secondary | ICD-10-CM

## 2013-11-01 DIAGNOSIS — B379 Candidiasis, unspecified: Secondary | ICD-10-CM

## 2013-11-01 LAB — POCT RAPID STREP A (OFFICE): Rapid Strep A Screen: NEGATIVE

## 2013-11-01 MED ORDER — FLUCONAZOLE 150 MG PO TABS: ORAL_TABLET | ORAL | Status: DC

## 2013-11-01 MED ORDER — AMOXICILLIN-POT CLAVULANATE 875-125 MG PO TABS: 1.0000 | ORAL_TABLET | Freq: Two times a day (BID) | ORAL | Status: DC

## 2013-11-01 NOTE — Patient Instructions (Signed)

## 2013-11-01 NOTE — Progress Notes (Signed)
Pre visit review using our clinic review tool, if applicable. No additional management support is needed unless otherwise documented below in the visit note. 

## 2013-11-01 NOTE — Progress Notes (Signed)
Subjective:    Patient ID: Tiffany Acevedo, female    DOB: [birth year], 48 y.o.   MRN: [id number]  HPI  Pt presents to the clinic today with c/o sore throat. She reports this started 2 days ago. She has noticed that her glands are swollen. She has had chills and sweats. 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 She has tried taking Tylenol with some relief.  Review of Systems      No past medical history on file.  Current Outpatient Prescriptions  Medication Sig Dispense Refill  . bisoprolol-hydrochlorothiazide (ZIAC) 5-6.25 MG per tablet Take 1 tablet by mouth daily.       Marland Kitchen ibuprofen (ADVIL,MOTRIN) 800 MG tablet Take 800 mg by mouth as needed.      Marland Kitchen imipramine (TOFRANIL) 50 MG tablet Take 50 mg by mouth at bedtime.       . rizatriptan (MAXALT) 10 MG tablet Take 10 mg by mouth as needed for migraine.        No current facility-administered medications for this visit.    Allergies  Allergen Reactions  . Sulfonamide Derivatives     REACTION: unspecified    No family history on file.  History   Social History  . Marital Status: Married    Spouse Name: N/A    Number of Children: N/A  . Years of Education: N/A   Occupational History  . Not on file.   Social History Main Topics  . Smoking status: Never Smoker   . Smokeless tobacco: Never Used  . Alcohol Use: 0.0 oz/week     Comment: glass of wine occassionally  . Drug Use: No  . Sexual Activity: Yes    Partners: Male    Birth Control/ Protection: Surgical   Other Topics Concern  . Not on file   Social History Narrative  . No narrative on file     Constitutional: Denies fever, malaise, fatigue, headache or abrupt weight changes.  HEENT: Pt reports sore throat. Denies eye pain, eye redness, ear pain, ringing in the ears, wax buildup, runny nose, nasal congestion, bloody nose. Respiratory: Denies difficulty breathing, shortness of breath, cough or sputum production.      No  other specific complaints in a complete review of systems (except as listed in HPI above).  Objective:   Physical Exam  BP 110/70  Pulse 102  Temp(Src) 98.9 F (37.2 C) (Oral)  Wt 148 lb (67.132 kg)  SpO2 99% Wt Readings from Last 3 Encounters:  11/01/13 148 lb (67.132 kg)  06/22/13 145 lb 8 oz (65.998 kg)  03/09/13 152 lb (68.947 kg)    General: Appears her stated age, well developed, well nourished in NAD. HEENT:Ears: Tm's gray and intact, normal light reflex; Nose: mucosa pink and moist, septum midline; Throat/Mouth: Teeth present, mucosa erythematous and moist, exudate noted on left tonsillar pillar, no lesions or ulcerations noted. Cervical adenopathy noted. Cardiovascular: Tachycardic with normal rhythm. S1,S2 noted.  No murmur, rubs or gallops noted.  Pulmonary/Chest: Normal effort and positive vesicular breath sounds. No respiratory distress. No wheezes, rales or ronchi noted.    BMET    Component Value Date/Time   NA 138 06/22/2013 1656   K 3.7 06/22/2013 1656   CL 100 06/22/2013 1656   CO2 23 06/22/2013 1656   GLUCOSE 71 06/22/2013 1656   BUN 17 06/22/2013 1656   CREATININE 0.88 06/22/2013 1656   CREATININE 0.7 03/12/2010 1709   CALCIUM 9.7 06/22/2013  [place] 03/28/2006 1249   GFRAA 102 03/28/2006 1249    Lipid Panel  No results found for this basename: chol, trig, hdl, cholhdl, vldl, ldlcalc    CBC    Component Value Date/Time   WBC 12.4* 06/22/2013 1656   RBC 4.30 06/22/2013 1656   HGB 12.2 06/22/2013 1656   HCT 35.4* 06/22/2013 1656   PLT 252 06/22/2013 1656   MCV 82.3 06/22/2013 1656   MCH 28.4 06/22/2013 1656   MCHC 34.5 06/22/2013 1656   RDW 13.6 06/22/2013 1656   LYMPHSABS 3.5 06/22/2013 1656   MONOABS 1.0 06/22/2013 1656   EOSABS 0.2 06/22/2013 1656   BASOSABS 0.0 06/22/2013 1656    Hgb A1C No results found for this basename: HGBA1C         Assessment & Plan:   Viral sore throat:  RST: negative Will send throat culture Fever, exudate,  enlarged tonsils- will treat using Centor criteria Amoxil BID x 10 days Diflucan for antibiotic induced yeast infection Rest, push fluids, ibuprofen and salt water gargles  RTC as needed or if symptoms persist or worsen

## 2013-11-01 NOTE — Addendum Note (Signed)
Addended by: Lurlean Nanny on: 11/01/2013 09:36 AM   Modules accepted: Orders

## 2013-11-01 NOTE — Progress Notes (Signed)
   Subjective:    Patient ID: Tiffany Acevedo, female    DOB: May 24, 1965, 48 y.o.   MRN: [id number]  HPI Patient presents with sore throat, swollen glands and difficulty swallowing for the past two days.  She has taken ibuprofen with little relief. 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  Current Outpatient Prescriptions  Medication Sig Dispense Refill  . bisoprolol-hydrochlorothiazide (ZIAC) 5-6.25 MG per tablet Take 1 tablet by mouth daily.       Marland Kitchen ibuprofen (ADVIL,MOTRIN) 800 MG tablet Take 800 mg by mouth as needed.      Marland Kitchen imipramine (TOFRANIL) 50 MG tablet Take 50 mg by mouth at bedtime.       . rizatriptan (MAXALT) 10 MG tablet Take 10 mg by mouth as needed for migraine.        No current facility-administered medications for this visit.    Allergies  Allergen Reactions  . Sulfonamide Derivatives     REACTION: unspecified    No family history on file.  History   Social History  . Marital Status: Married    Spouse Name: N/A    Number of Children: N/A  . Years of Education: N/A   Occupational History  . Not on file.   Social History Main Topics  . Smoking status: Never Smoker   . Smokeless tobacco: Never Used  . Alcohol Use: 0.0 oz/week     Comment: glass of wine occassionally  . Drug Use: No  . Sexual Activity: Yes    Partners: Male    Birth Control/ Protection: Surgical   Other Topics Concern  . Not on file   Social History Narrative  . No narrative on file     Constitutional: Denies fever, malaise, fatigue, headache or abrupt weight changes.  HEENT: Denies eye pain, eye redness, ear pain, ringing in the ears, wax buildup, runny nose, nasal congestion, bloody nose, or sore throat. Respiratory: Denies difficulty breathing, shortness of breath, cough or sputum production.   Cardiovascular: Denies chest  pain, chest tightness, palpitations or swelling in the hands or feet.  Gastrointestinal: Denies abdominal pain, bloating, constipation, diarrhea or blood in the stool.  GU: Denies urgency, frequency, pain with urination, burning sensation, blood in urine, odor or discharge. Musculoskeletal: Denies decrease in range of motion, difficulty with gait, muscle pain or joint pain and swelling.  Skin: Denies redness, rashes, lesions or ulcercations.  Neurological: Denies dizziness, difficulty with memory, difficulty with speech or problems with balance and coordination.   No other specific complaints in a complete review of systems (except as listed in HPI above).     Objective:   Physical Exam        Assessment & Plan:

## 2013-11-03 LAB — CULTURE, GROUP A STREP: Organism ID, Bacteria: NORMAL

## 2013-11-26 ENCOUNTER — Telehealth: Payer: Self-pay | Admitting: Internal Medicine

## 2013-11-26 ENCOUNTER — Ambulatory Visit (INDEPENDENT_AMBULATORY_CARE_PROVIDER_SITE_OTHER): Payer: BC Managed Care – PPO | Admitting: Internal Medicine

## 2013-11-26 ENCOUNTER — Encounter: Payer: Self-pay | Admitting: Internal Medicine

## 2013-11-26 VITALS — BP 126/78 | HR 89 | Temp 98.9°F | Resp 16 | Ht 64.5 in | Wt 149.5 lb

## 2013-11-26 DIAGNOSIS — R300 Dysuria: Secondary | ICD-10-CM

## 2013-11-26 DIAGNOSIS — T3695XA Adverse effect of unspecified systemic antibiotic, initial encounter: Secondary | ICD-10-CM

## 2013-11-26 DIAGNOSIS — N3090 Cystitis, unspecified without hematuria: Secondary | ICD-10-CM

## 2013-11-26 DIAGNOSIS — B379 Candidiasis, unspecified: Secondary | ICD-10-CM

## 2013-11-26 LAB — POCT URINALYSIS DIPSTICK
Bilirubin, UA: NEGATIVE
Glucose, UA: NEGATIVE
Ketones, UA: NEGATIVE
Nitrite, UA: NEGATIVE
Protein, UA: NEGATIVE
Spec Grav, UA: 1.02
Urobilinogen, UA: 0.2
pH, UA: 5.5

## 2013-11-26 MED ORDER — CIPROFLOXACIN HCL 250 MG PO TABS: 250.0000 mg | ORAL_TABLET | Freq: Two times a day (BID) | ORAL | Status: DC

## 2013-11-26 MED ORDER — FLUCONAZOLE 150 MG PO TABS: 150.0000 mg | ORAL_TABLET | Freq: Every day | ORAL | Status: DC

## 2013-11-26 NOTE — Telephone Encounter (Signed)
Tiffany Acevedo called saying she thinks she has a UTI. She's wondering if she can stop by to give a Urine Analysis without having an appt. She's been to [facility] before due to not being able to get in here. She's experiencing pain. Please call the patient.  Pt ph# [phone] Thank you.

## 2013-11-26 NOTE — Progress Notes (Signed)
Pre-visit discussion using our clinic review tool. No additional management support is needed unless otherwise documented below in the visit note.  

## 2013-11-26 NOTE — Patient Instructions (Signed)
.  I am treating your with Ciprofloxacin for 5 days for your UTI  We will call you with he results of the urine culture  Your "yeast infection" may be recurrent due to inadequate treatment,  So I have called in fluconazole to take for 2 days  If the symptoms persist, we should do a pelvic exam   Please take a probiotic ( Align, Floraque or Culturelle) for 2 weeks if you start the antibiotic to prevent a serious antibiotic associated diarrhea  Called" clostridium dificile colitis" ( should also help prevent   vaginal yeast infection)

## 2013-11-26 NOTE — Telephone Encounter (Signed)
Pt referred to scheduling for appoint

## 2013-11-26 NOTE — Progress Notes (Signed)
Patient ID: Tiffany Acevedo, female   DOB: 1965-08-05, 48 y.o.   MRN: [id number]   Patient Active Problem List   Diagnosis Date Noted  . Cystitis 11/27/2013  . Lack of libido 06/24/2013  . History of migraine headaches 03/11/2013  . Generalized anxiety disorder 03/11/2013  . Hypertension 03/12/2010    Subjective:  CC:   Chief Complaint  Patient presents with  . Acute Visit  . Urinary Tract Infection    HPI:   Tiffany Acevedo is a 48 y.o. female who presents for  Dysuria started yesterday ,  Intermittent,  More severe today,  Had episode of left inguinal  crampy pain, earlier today, followed by passage of a  large clot of blood.  No history of renal calculi,  Not pregnant,  Periods have been irregular.    No back pain or nausea.    No past medical history on file.  No past surgical history on file.     The following portions of the patient's history were reviewed and updated as appropriate: Allergies, current medications, and problem list.    Review of Systems:   Patient denies headache, fevers, malaise, unintentional weight loss, skin rash, eye pain, sinus congestion and sinus pain, sore throat, dysphagia,  hemoptysis , cough, dyspnea, wheezing, chest pain, palpitations, orthopnea, edema, abdominal pain, nausea, melena, diarrhea, constipation, flank pain, dysuria, hematuria, urinary  Frequency, nocturia, numbness, tingling, seizures,  Focal weakness, Loss of consciousness,  Tremor, insomnia, depression, anxiety, and suicidal ideation.     History   Social History  . Marital Status: Married    Spouse Name: N/A    Number of Children: N/A  . Years of Education: N/A   Occupational History  . Not on file.   Social History Main Topics  . Smoking status: Never Smoker   . Smokeless tobacco: Never Used  . Alcohol Use: 0.0 oz/week     Comment: glass of wine occassionally  . Drug Use: No  . Sexual Activity: Yes    Partners: Male    Birth Control/ Protection: Surgical    Other Topics Concern  . Not on file   Social History Narrative  . No narrative on file    Objective:  Filed Vitals:   11/26/13 1615  BP: 126/78  Pulse: 89  Temp: 98.9 F (37.2 C)  Resp: 16     General appearance: alert, cooperative and appears stated age Back: symmetric, no curvature. ROM normal. No CVA tenderness. Lungs: clear to auscultation bilaterally Heart: regular rate and rhythm, S1, S2 normal, no murmur, click, rub or gallop Abdomen: soft, non-tender; bowel sounds normal; no masses,  no organomegaly Pulses: 2+ and symmetric Skin: Skin color, texture, turgor normal. No rashes or lesions Lymph nodes: Cervical, supraclavicular, and axillary nodes normal.  Assessment and Plan:  Cystitis Empiric antibioitics prescribed,  Culture is pending. Antibiotics will be reviewed once sensitivities of bacterial organism are finalized and patient will be updated.     Updated Medication List Outpatient Encounter Prescriptions as of 11/26/2013  Medication Sig  . bisoprolol-hydrochlorothiazide (ZIAC) 5-6.25 MG per tablet Take 1 tablet by mouth daily.   Marland Kitchen ibuprofen (ADVIL,MOTRIN) 800 MG tablet Take 800 mg by mouth as needed.  Marland Kitchen imipramine (TOFRANIL) 50 MG tablet Take 50 mg by mouth at bedtime.   . rizatriptan (MAXALT) 10 MG tablet Take 10 mg by mouth as needed for migraine.   Marland Kitchen amoxicillin-clavulanate (AUGMENTIN) 875-125 MG per tablet Take 1 tablet by mouth 2 (two) times daily.  Marland Kitchen  ciprofloxacin (CIPRO) 250 MG tablet Take 1 tablet (250 mg total) by mouth 2 (two) times daily.  . fluconazole (DIFLUCAN) 150 MG tablet Take 1 tablet (150 mg total) by mouth daily. FOR 2 DAYS  . [DISCONTINUED] fluconazole (DIFLUCAN) 150 MG tablet May repeat treatment in 3 days if needed     Orders Placed This Encounter  Procedures  . Urine Culture  . Urinalysis, Routine w reflex microscopic  . POCT Urinalysis Dipstick    No Follow-up on file.

## 2013-11-27 DIAGNOSIS — N3090 Cystitis, unspecified without hematuria: Secondary | ICD-10-CM | POA: Insufficient documentation

## 2013-11-27 LAB — URINALYSIS, ROUTINE W REFLEX MICROSCOPIC
Bilirubin Urine: NEGATIVE
Ketones, ur: NEGATIVE
Nitrite: POSITIVE — AB
Specific Gravity, Urine: 1.025 (ref 1.000–1.030)
Total Protein, Urine: NEGATIVE
Urine Glucose: NEGATIVE
Urobilinogen, UA: 0.2 (ref 0.0–1.0)
pH: 6 (ref 5.0–8.0)

## 2013-11-27 NOTE — Assessment & Plan Note (Signed)
Empiric antibioitics prescribed,  Culture is pending. Antibiotics will be reviewed once sensitivities of bacterial organism are finalized and patient will be updated.

## 2013-11-29 LAB — URINE CULTURE: Colony Count: >=100000

## 2014-01-26 ENCOUNTER — Other Ambulatory Visit: Payer: Self-pay | Admitting: Internal Medicine

## 2014-04-27 ENCOUNTER — Other Ambulatory Visit: Payer: Self-pay | Admitting: Internal Medicine

## 2014-06-02 NOTE — H&P (Signed)
PATIENT NAME:  Tiffany, Acevedo MR#:  [id number] DATE OF BIRTH:  Jun 19, 1965  DATE OF ADMISSION:  02/10/2011  ADMISSION DIAGNOSES:  1. Term. 2. Advanced maternal age. 3. History of Cesarean section. 4. Chronic hypertension exacerbated by elevated late third trimester pressures.  MEDICATIONS: Aldomet 250 t.i.d.   ILLNESSES: None.   PAST SURGICAL HISTORY: Cesarean section x2.   REVIEW OF SYSTEMS: Normal except for migraine headaches.   PHYSICAL EXAMINATION:   VITAL SIGNS: Afebrile. Vitals stable. Nonstress test reactive.   LUNGS: Clear.   HEART: Regular.   ABDOMEN: Gravid, nontender, appropriate gestational age.   LOWER EXTREMITIES: Nontender.   PELVIC: Cervix is closed.   IMPRESSION: Term gestation for repeat Cesarean section. Gestation was complicated by AMA and hypertension.     PLAN: Repeat low transverse Cesarean section.   ____________________________ Rockey Situ. Amalia Hailey, MD rle:drc D: 02/20/2011 15:53:52 ET T: 02/20/2011 16:11:24 ET JOB#: [id number]  cc: Audry Pili L. Amalia Hailey, MD, <Dictator> Selmer Dominion MD ELECTRONICALLY SIGNED 02/20/2011 16:36

## 2014-06-02 NOTE — Op Note (Signed)
PATIENT NAME:  Tiffany Acevedo, Tiffany Acevedo MR#:  [id number] DATE OF BIRTH:  Jul 19, 1965  DATE OF PROCEDURE:  02/10/2011  PREOPERATIVE DIAGNOSES: Term gestation, chronic hypertension, and most likely exacerbated pregnancy induced hypertension with previous cesarean section.   POSTOPERATIVE DIAGNOSES: Term gestation, chronic hypertension, and most likely exacerbated pregnancy induced hypertension with previous cesarean section.   PROCEDURE: Repeat low transverse cesarean section.   SURGEON: Ricky L. Amalia Hailey, MD  ASSISTANT: Flo Shanks, CNM  ANESTHESIA: Spinal - Lucilla Edin, MD  FINDINGS: Postsurgical abdomen. Vigorous female infant. Apgars 9 and 9. Weight 7 pounds, 5 ounces.   ESTIMATED BLOOD LOSS: 700 mL.   COMPLICATIONS: None.   SPECIMENS: None.   DRAINS: Foley catheter.   POSTOPERATIVE PAIN CONTROL: On-Q pain pump was placed.   PROCEDURE IN DETAIL: The patient is 37 weeks and 2 days and has been followed for history of chronic hypertension with recent exacerbation while on Aldomet 250 mg twice a day and increased to three times daily last week and elevated uric acid, otherwise normal preeclampsia labs. Fluid level at last check was 9.5. Platelets have been in the 1-teens. We elected to proceed with delivery. Consent was signed.   The patient was taken to the operating room. She was placed in the sitting position where a spinal was placed and then placed in the supine position and prepped and draped in the usual sterile fashion. A Foley catheter was inserted. The abdomen was prepped and draped in the usual sterile fashion. After assuring adequate anesthesia, a #10 blade was used to create a Pfannenstiel incision between two previous scars and repeat low transverse cesarean section was carried out in the usual fashion with chromic interlocking on the uterus and Interceed was placed in the inverted T fashion. An On-Q pain pump was placed subfascially and the fascia was closed left to right with 0  Vicryl. The subcutaneous was made hemostatic with cautery. There was some pinching at the lower incision and this area was undermined at the left and right apices and observed for hemostasis. It seemed to be good. The incision was closed with clips. A pressure dressing was applied. An On-Q pump was flushed and affixed in the usual fashion. The patient tolerated the procedure well. Urine was clear throughout. It was noted that she had had a tubal ligation previously (this was an IVF pregnancy). I anticipate a routine postoperative course.  ____________________________ Rockey Situ. Amalia Hailey, MD rle:slb D: 02/10/2011 08:29:01 ET T: 02/10/2011 11:43:26 ET JOB#: [id number]  cc: Ricky L. Amalia Hailey, MD, <Dictator> Selmer Dominion MD ELECTRONICALLY SIGNED 02/11/2011 9:24

## 2014-07-24 ENCOUNTER — Other Ambulatory Visit: Payer: Self-pay | Admitting: Obstetrics and Gynecology

## 2014-07-24 DIAGNOSIS — N63 Unspecified lump in unspecified breast: Secondary | ICD-10-CM

## 2014-07-31 ENCOUNTER — Ambulatory Visit
Admission: RE | Admit: 2014-07-31 | Discharge: 2014-07-31 | Disposition: A | Discharge status: Home / Self Care | Payer: BC Managed Care – PPO | Source: Ambulatory Visit | Attending: Obstetrics and Gynecology | Admitting: Obstetrics and Gynecology

## 2014-07-31 DIAGNOSIS — N63 Unspecified lump in unspecified breast: Secondary | ICD-10-CM

## 2014-11-07 ENCOUNTER — Ambulatory Visit: Payer: BC Managed Care – PPO | Admitting: Family Medicine

## 2015-03-04 DIAGNOSIS — N200 Calculus of kidney: Secondary | ICD-10-CM | POA: Insufficient documentation

## 2015-04-07 ENCOUNTER — Encounter: Payer: Self-pay | Admitting: *Deleted

## 2015-04-07 DIAGNOSIS — I10 Essential (primary) hypertension: Secondary | ICD-10-CM | POA: Insufficient documentation

## 2015-04-07 DIAGNOSIS — Z3202 Encounter for pregnancy test, result negative: Secondary | ICD-10-CM | POA: Insufficient documentation

## 2015-04-07 DIAGNOSIS — N200 Calculus of kidney: Secondary | ICD-10-CM | POA: Diagnosis not present

## 2015-04-07 DIAGNOSIS — Z792 Long term (current) use of antibiotics: Secondary | ICD-10-CM | POA: Insufficient documentation

## 2015-04-07 DIAGNOSIS — R109 Unspecified abdominal pain: Secondary | ICD-10-CM | POA: Diagnosis present

## 2015-04-07 DIAGNOSIS — Z79899 Other long term (current) drug therapy: Secondary | ICD-10-CM | POA: Diagnosis not present

## 2015-04-07 LAB — BASIC METABOLIC PANEL
Anion gap: 9 (ref 5–15)
BUN: 20 mg/dL (ref 6–20)
CO2: 24 mmol/L (ref 22–32)
Calcium: 9.2 mg/dL (ref 8.9–10.3)
Chloride: 105 mmol/L (ref 101–111)
Creatinine, Ser: 0.97 mg/dL (ref 0.44–1.00)
GFR calc Af Amer: >60 mL/min (ref >60)
GFR calc non Af Amer: >60 mL/min (ref >60)
Glucose, Bld: 118 mg/dL — ABNORMAL HIGH (ref 65–99)
Potassium: 3.6 mmol/L (ref 3.5–5.1)
Sodium: 138 mmol/L (ref 135–145)

## 2015-04-07 LAB — CBC
HCT: 39.1 % (ref 35.0–47.0)
Hemoglobin: 13 g/dL (ref 12.0–16.0)
MCH: 28.2 pg (ref 26.0–34.0)
MCHC: 33.4 g/dL (ref 32.0–36.0)
MCV: 84.5 fL (ref 80.0–100.0)
Platelets: 233 10*3/uL (ref 150–440)
RBC: 4.62 MIL/uL (ref 3.80–5.20)
RDW: 14.3 % (ref 11.5–14.5)
WBC: 13.8 10*3/uL — ABNORMAL HIGH (ref 3.6–11.0)

## 2015-04-07 LAB — URINALYSIS COMPLETE WITH MICROSCOPIC (ARMC ONLY)
Bilirubin Urine: NEGATIVE
Glucose, UA: NEGATIVE mg/dL
Ketones, ur: NEGATIVE mg/dL
Nitrite: NEGATIVE
Protein, ur: 30 mg/dL — AB
Specific Gravity, Urine: 1.012 (ref 1.005–1.030)
pH: 6 (ref 5.0–8.0)

## 2015-04-07 LAB — POCT PREGNANCY, URINE: Preg Test, Ur: NEGATIVE

## 2015-04-07 NOTE — ED Notes (Signed)
Pt has right flank pain.  Hx of kidney stones.  Pt has nausea.

## 2015-04-08 ENCOUNTER — Emergency Department
Admission: EM | Admit: 2015-04-08 | Discharge: 2015-04-08 | Disposition: A | Discharge status: Home / Self Care | Payer: BC Managed Care – PPO | Attending: Emergency Medicine | Admitting: Emergency Medicine

## 2015-04-08 DIAGNOSIS — N200 Calculus of kidney: Secondary | ICD-10-CM

## 2015-04-08 MED ORDER — TAMSULOSIN HCL 0.4 MG PO CAPS: 0.4000 mg | ORAL_CAPSULE | Freq: Every day | ORAL | Status: DC

## 2015-04-08 MED ORDER — SODIUM CHLORIDE 0.9 % IV BOLUS (SEPSIS)
1000.0000 mL | Freq: Once | INTRAVENOUS | Status: AC
  Administered 2015-04-08: 1000 mL via INTRAVENOUS

## 2015-04-08 MED ORDER — ONDANSETRON 4 MG PO TBDP: 4.0000 mg | ORAL_TABLET | Freq: Three times a day (TID) | ORAL | Status: DC | PRN

## 2015-04-08 MED ORDER — KETOROLAC TROMETHAMINE 30 MG/ML IJ SOLN
30.0000 mg | Freq: Once | INTRAMUSCULAR | Status: AC
  Administered 2015-04-08: 30 mg via INTRAVENOUS
  Filled 2015-04-08: qty 1

## 2015-04-08 NOTE — ED Provider Notes (Addendum)
[facility] Emergency Department Provider Note  ____________________________________________  Time seen: Approximately 124 AM  I have reviewed the triage vital signs and the nursing notes.   HISTORY  Chief Complaint Flank Pain    HPI Tiffany Acevedo is a 50 y.o. female who comes into the hospital today with a kidney stone attack. The patient reports that she has a history of kidney stones and started having some right flank pain around 7 PM. She reports that the pain became extremely around 8 and she took 2 hydrocodone at 9. The patient has this medication prescribed by Dr. Noland Fordyce off. She came and 11 PM because the pain was excruciating. The patient also reports that she was vomiting with some nausea. While waiting in the waiting room she reports that the symptoms improved and her pain is currently a 4 out of 10 in intensity. She reports that the pain was wrapping around her abdomen and going into her hip. The patient reports that she saw Dr. Noland Fordyce off 3 weeks ago and has been told she has multiple stones in her bilateral kidneys. The patient is here for treatment of her symptoms. She said she plan to follow-up with Dr. Priscella Mann off in the morning but could not tolerate the pain tonight.   Past Medical History  Diagnosis Date  . Breast mass     Physician found RT Breast Lump 10:00 & LT Breast Lump 7:00    Patient Active Problem List   Diagnosis Date Noted  . Cystitis 11/27/2013  . Lack of libido 06/24/2013  . History of migraine headaches 03/11/2013  . Generalized anxiety disorder 03/11/2013  . Hypertension 03/12/2010    No past surgical history on file.  Current Outpatient Rx  Name  Route  Sig  Dispense  Refill  . amoxicillin-clavulanate (AUGMENTIN) 875-125 MG per tablet   Oral   Take 1 tablet by mouth 2 (two) times daily.   20 tablet   0   . bisoprolol-hydrochlorothiazide (ZIAC) 5-6.25 MG per tablet      TAKE ONE TABLET BY MOUTH EVERY DAY   90  tablet   1     PROFILE FOR NEXT MONTH   . ciprofloxacin (CIPRO) 250 MG tablet   Oral   Take 1 tablet (250 mg total) by mouth 2 (two) times daily.   10 tablet   0   . fluconazole (DIFLUCAN) 150 MG tablet   Oral   Take 1 tablet (150 mg total) by mouth daily. FOR 2 DAYS   2 tablet   0     Dispense as written.   Marland Kitchen ibuprofen (ADVIL,MOTRIN) 800 MG tablet   Oral   Take 800 mg by mouth as needed.         Marland Kitchen imipramine (TOFRANIL) 50 MG tablet   Oral   Take 50 mg by mouth at bedtime.          . rizatriptan (MAXALT) 10 MG tablet   Oral   Take 10 mg by mouth as needed for migraine.            Allergies Percocet and Sulfonamide derivatives  No family history on file.  Social History Social History  Substance Use Topics  . Smoking status: Never Smoker   . Smokeless tobacco: Never Used  . Alcohol Use: 0.0 oz/week     Comment: glass of wine occassionally    Review of Systems Constitutional: No fever/chills Eyes: No visual changes. ENT: No sore throat. Cardiovascular: Denies chest pain.  Respiratory: Denies shortness of breath. Gastrointestinal: Right-sided abdominal pain, nausea and vomiting No diarrhea.  No constipation. Genitourinary: Negative for dysuria. Musculoskeletal: Right flank pain Skin: Negative for rash. Neurological: Negative for headaches, focal weakness or numbness.  10-point ROS otherwise negative.  ____________________________________________   PHYSICAL EXAM:  VITAL SIGNS: ED Triage Vitals  Enc Vitals Group     BP 04/07/15 2312 155/86 mmHg     Pulse Rate 04/07/15 2312 87     Resp 04/07/15 2312 20     Temp 04/07/15 2312 98.5 F (36.9 C)     Temp Source 04/07/15 2312 Oral     SpO2 04/07/15 2312 100 %     Weight 04/07/15 2312 140 lb (63.504 kg)     Height 04/07/15 2312 5\' 4"  (1.626 m)     Head Cir --      Peak Flow --      Pain Score 04/07/15 2313 8     Pain Loc --      Pain Edu? --      Excl. in [place]? --     Constitutional: Alert  and oriented. Well appearing and in mild distress. Eyes: Conjunctivae are normal. PERRL. EOMI. Head: Atraumatic. Nose: No congestion/rhinnorhea. Mouth/Throat: Mucous membranes are moist.  Oropharynx non-erythematous. Cardiovascular: Normal rate, regular rhythm. Grossly normal heart sounds.  Good peripheral circulation. Respiratory: Normal respiratory effort.  No retractions. Lungs CTAB. Gastrointestinal: Soft with right sided abdominal pain to palpation. No distention. Positive bowel sounds with right sided CVA tenderness to palpation Musculoskeletal: No lower extremity tenderness nor edema.  Neurologic:  Normal speech and language.  Skin:  Skin is warm, dry and intact.  Psychiatric: Mood and affect are normal.   ____________________________________________   LABS (all labs ordered are listed, but only abnormal results are displayed)  Labs Reviewed  BASIC METABOLIC PANEL - Abnormal; Notable for the following:    Glucose, Bld 118 (*)    All other components within normal limits  CBC - Abnormal; Notable for the following:    WBC 13.8 (*)    All other components within normal limits  URINALYSIS COMPLETEWITH MICROSCOPIC ([facility] ONLY) - Abnormal; Notable for the following:    Color, Urine YELLOW (*)    APPearance CLEAR (*)    Hgb urine dipstick 3+ (*)    Protein, ur 30 (*)    Leukocytes, UA TRACE (*)    Bacteria, UA RARE (*)    Squamous Epithelial / LPF 0-5 (*)    All other components within normal limits  POC URINE PREG, ED  POCT PREGNANCY, URINE   ____________________________________________  EKG  None ____________________________________________  RADIOLOGY  None ____________________________________________   PROCEDURES  Procedure(s) performed: None  Critical Care performed: No  ____________________________________________   INITIAL IMPRESSION / ASSESSMENT AND PLAN / ED COURSE  Pertinent labs & imaging results that were available during my care of the patient  were reviewed by me and considered in my medical decision making (see chart for details).  This is a 50 year old female who comes into the hospital with some right-sided flank pain and a history of kidney stones. I will give the patient dose of Toradol as well as a liter of normal saline to see if I can eradicate the remainder of her pain. I will reassess the patient once she's receive her medication. At this time the patient reports she's had recent imaging's I will hold off on repeating the imaging on the patient. She does not have a history of lithotripsy.  The  patient's pain is improved after the Toradol as well as normal saline. Patient does follow with Dr. Bernardo Heater. I will discharge the patient have her follow back up with Dr. Bernardo Heater. I did review Dr. Dene Gentry notes from when the patient saw him 3 weeks ago and there were reports that the patient had nephrolithiasis bilaterally with an 8 mm and a 9 mm stone. The patient will be discharged to follow-up with Dr. Bernardo Heater. ____________________________________________   FINAL CLINICAL IMPRESSION(S) / ED DIAGNOSES  Final diagnoses:  Kidney stone      Loney Hering, MD 04/08/15 0404  Loney Hering, MD 04/08/15 [id number]

## 2015-04-08 NOTE — ED Notes (Signed)
Pt reports that she currently pain free after medication received; Dr Dahlia Client notified

## 2015-04-08 NOTE — Discharge Instructions (Signed)
Kidney Stones °Kidney stones (urolithiasis) are deposits that form inside your kidneys. The intense pain is caused by the stone moving through the urinary tract. When the stone moves, the ureter goes into spasm around the stone. The stone is usually passed in the urine.  °CAUSES  °· A disorder that makes certain neck glands produce too much parathyroid hormone (primary hyperparathyroidism). °· A buildup of uric acid crystals, similar to gout in your joints. °· Narrowing (stricture) of the ureter. °· A kidney obstruction present at birth (congenital obstruction). °· Previous surgery on the kidney or ureters. °· Numerous kidney infections. °SYMPTOMS  °· Feeling sick to your stomach (nauseous). °· Throwing up (vomiting). °· Blood in the urine (hematuria). °· Pain that usually spreads (radiates) to the groin. °· Frequency or urgency of urination. °DIAGNOSIS  °· Taking a history and physical exam. °· Blood or urine tests. °· CT scan. °· Occasionally, an examination of the inside of the urinary bladder (cystoscopy) is performed. °TREATMENT  °· Observation. °· Increasing your fluid intake. °· Extracorporeal shock wave lithotripsy--This is a noninvasive procedure that uses shock waves to break up kidney stones. °· Surgery may be needed if you have severe pain or persistent obstruction. There are various surgical procedures. Most of the procedures are performed with the use of small instruments. Only small incisions are needed to accommodate these instruments, so recovery time is minimized. °The size, location, and chemical composition are all important variables that will determine the proper choice of action for you. Talk to your health care provider to better understand your situation so that you will minimize the risk of injury to yourself and your kidney.  °HOME CARE INSTRUCTIONS  °· Drink enough water and fluids to keep your urine clear or pale yellow. This will help you to pass the stone or stone fragments. °· Strain  all urine through the provided strainer. Keep all particulate matter and stones for your health care provider to see. The stone causing the pain may be as small as a grain of salt. It is very important to use the strainer each and every time you pass your urine. The collection of your stone will allow your health care provider to analyze it and verify that a stone has actually passed. The stone analysis will often identify what you can do to reduce the incidence of recurrences. °· Only take over-the-counter or prescription medicines for pain, discomfort, or fever as directed by your health care provider. °· Keep all follow-up visits as told by your health care provider. This is important. °· Get follow-up X-rays if required. The absence of pain does not always mean that the stone has passed. It may have only stopped moving. If the urine remains completely obstructed, it can cause loss of kidney function or even complete destruction of the kidney. It is your responsibility to make sure X-rays and follow-ups are completed. Ultrasounds of the kidney can show blockages and the status of the kidney. Ultrasounds are not associated with any radiation and can be performed easily in a matter of minutes. °· Make changes to your daily diet as told by your health care provider. You may be told to: °¨ Limit the amount of salt that you eat. °¨ Eat 5 or more servings of fruits and vegetables each day. °¨ Limit the amount of meat, poultry, fish, and eggs that you eat. °· Collect a 24-hour urine sample as told by your health care provider. You may need to collect another urine sample every 6-12   months. SEEK MEDICAL CARE IF:  You experience pain that is progressive and unresponsive to any pain medicine you have been prescribed. SEEK IMMEDIATE MEDICAL CARE IF:   Pain cannot be controlled with the prescribed medicine.  You have a fever or shaking chills.  The severity or intensity of pain increases over 18 hours and is not  relieved by pain medicine.  You develop a new onset of abdominal pain.  You feel faint or pass out.  You are unable to urinate.   This information is not intended to replace advice given to you by your health care provider. Make sure you discuss any questions you have with your health care provider.   Document Released: 01/25/2005 Document Revised: 10/16/2014 Document Reviewed: 06/28/2012 Elsevier Interactive Patient Education 2016 [employer].  Renal Colic Renal colic is pain that is caused by passing a kidney stone. The pain can be sharp and severe. It may be felt in the back, abdomen, side (flank), or groin. It can cause nausea. Renal colic can come and go. HOME CARE INSTRUCTIONS Watch your condition for any changes. The following actions may help to lessen any discomfort that you are feeling:  Take medicines only as directed by your health care provider.  Ask your health care provider if it is okay to take over-the-counter pain medicine.  Drink enough fluid to keep your urine clear or pale yellow. Drink 6-8 glasses of water each day.  Limit the amount of salt that you eat to less than 2 grams per day.  Reduce the amount of protein in your diet. Eat less meat, fish, nuts, and dairy.  Avoid foods such as spinach, rhubarb, nuts, or bran. These may make kidney stones more likely to form. SEEK MEDICAL CARE IF:  You have a fever or chills.  Your urine smells bad or looks cloudy.  You have pain or burning when you pass urine. SEEK IMMEDIATE MEDICAL CARE IF:  Your flank pain or groin pain suddenly worsens.  You become confused or disoriented or you lose consciousness.   This information is not intended to replace advice given to you by your health care provider. Make sure you discuss any questions you have with your health care provider.   Document Released: 11/04/2004 Document Revised: 02/15/2014 Document Reviewed: 12/05/2013 Elsevier Interactive Patient Education [employer]

## 2015-04-08 NOTE — ED Notes (Signed)
Pt uprite on stretcher in exam room with no distress noted; reports hx kidney stones and tonight began having right flank pain that radiating around into right hip/groin accomp by nausea; st pain has resolved somewhat since waiting in lobby and denies any nausea at present

## 2015-04-12 DIAGNOSIS — N201 Calculus of ureter: Secondary | ICD-10-CM | POA: Insufficient documentation

## 2015-04-12 DIAGNOSIS — N23 Unspecified renal colic: Secondary | ICD-10-CM | POA: Insufficient documentation

## 2015-09-08 ENCOUNTER — Other Ambulatory Visit: Payer: Self-pay | Admitting: Obstetrics and Gynecology

## 2015-09-23 ENCOUNTER — Other Ambulatory Visit: Payer: Self-pay | Admitting: Obstetrics and Gynecology

## 2015-09-23 DIAGNOSIS — Z1231 Encounter for screening mammogram for malignant neoplasm of breast: Secondary | ICD-10-CM

## 2015-10-02 ENCOUNTER — Ambulatory Visit: Payer: BC Managed Care – PPO

## 2015-10-20 ENCOUNTER — Other Ambulatory Visit: Payer: Self-pay | Admitting: Obstetrics and Gynecology

## 2015-10-20 ENCOUNTER — Ambulatory Visit
Admission: RE | Admit: 2015-10-20 | Discharge: 2015-10-20 | Disposition: A | Discharge status: Home / Self Care | Payer: BC Managed Care – PPO | Source: Ambulatory Visit | Attending: Obstetrics and Gynecology | Admitting: Obstetrics and Gynecology

## 2015-10-20 DIAGNOSIS — Z1231 Encounter for screening mammogram for malignant neoplasm of breast: Secondary | ICD-10-CM | POA: Insufficient documentation

## 2015-10-24 ENCOUNTER — Other Ambulatory Visit: Payer: Self-pay | Admitting: Obstetrics and Gynecology

## 2015-10-24 DIAGNOSIS — N63 Unspecified lump in unspecified breast: Secondary | ICD-10-CM

## 2015-11-06 ENCOUNTER — Ambulatory Visit
Admission: RE | Admit: 2015-11-06 | Discharge: 2015-11-06 | Disposition: A | Discharge status: Home / Self Care | Payer: BC Managed Care – PPO | Source: Ambulatory Visit | Attending: Obstetrics and Gynecology | Admitting: Obstetrics and Gynecology

## 2015-11-06 DIAGNOSIS — N63 Unspecified lump in unspecified breast: Secondary | ICD-10-CM

## 2015-11-06 DIAGNOSIS — N6002 Solitary cyst of left breast: Secondary | ICD-10-CM | POA: Diagnosis not present

## 2015-11-06 DIAGNOSIS — N6001 Solitary cyst of right breast: Secondary | ICD-10-CM | POA: Insufficient documentation

## 2015-12-23 ENCOUNTER — Encounter: Payer: Self-pay | Admitting: Family

## 2015-12-23 ENCOUNTER — Ambulatory Visit (INDEPENDENT_AMBULATORY_CARE_PROVIDER_SITE_OTHER): Payer: BC Managed Care – PPO | Admitting: Family

## 2015-12-23 VITALS — BP 126/70 | HR 82 | Temp 98.4°F | Ht 64.0 in | Wt 159.2 lb

## 2015-12-23 DIAGNOSIS — N3001 Acute cystitis with hematuria: Secondary | ICD-10-CM

## 2015-12-23 LAB — POCT URINALYSIS DIPSTICK
Bilirubin, UA: NEGATIVE
Glucose, UA: NEGATIVE
Ketones, UA: NEGATIVE
Nitrite, UA: NEGATIVE
Protein, UA: 30
Spec Grav, UA: 1.015
Urobilinogen, UA: 0.2
pH, UA: 6.5

## 2015-12-23 MED ORDER — CEFDINIR 300 MG PO CAPS: 300.0000 mg | ORAL_CAPSULE | Freq: Two times a day (BID) | ORAL | 0 refills | Status: AC

## 2015-12-23 NOTE — Progress Notes (Signed)
Pre visit review using our clinic review tool, if applicable. No additional management support is needed unless otherwise documented below in the visit note. 

## 2015-12-23 NOTE — Patient Instructions (Addendum)
Drink plenty of water and take antibiotic as prescribed.   Follow up with urologist.   We are pending the urine culture to know the organism causing the infection and our office will call you with results. If the particular organism requires a different antibiotic than the on prescribed, we will place an order for a new prescription at that time.   If you symptoms worsen or you have new symptoms, please contact our office, or return to clinic for re evaluation.  Urinary Tract Infection Urinary tract infections (UTIs) can develop anywhere along your urinary tract. Your urinary tract is your body's drainage system for removing wastes and extra water. Your urinary tract includes two kidneys, two ureters, a bladder, and a urethra. Your kidneys are a pair of bean-shaped organs. Each kidney is about the size of your fist. They are located below your ribs, one on each side of your spine. CAUSES Infections are caused by microbes, which are microscopic organisms, including fungi, viruses, and bacteria. These organisms are so small that they can only be seen through a microscope. Bacteria are the microbes that most commonly cause UTIs. SYMPTOMS  Symptoms of UTIs may vary by age and gender of the patient and by the location of the infection. Symptoms in young women typically include a frequent and intense urge to urinate and a painful, burning feeling in the bladder or urethra during urination. Older women and men are more likely to be tired, shaky, and weak and have muscle aches and abdominal pain. A fever may mean the infection is in your kidneys. Other symptoms of a kidney infection include pain in your back or sides below the ribs, nausea, and vomiting. DIAGNOSIS To diagnose a UTI, your caregiver will ask you about your symptoms. Your caregiver will also ask you to provide a urine sample. The urine sample will be tested for bacteria and white blood cells. White blood cells are made by your body to help fight  infection. TREATMENT  Typically, UTIs can be treated with medication. Because most UTIs are caused by a bacterial infection, they usually can be treated with the use of antibiotics. The choice of antibiotic and length of treatment depend on your symptoms and the type of bacteria causing your infection. HOME CARE INSTRUCTIONS  If you were prescribed antibiotics, take them exactly as your caregiver instructs you. Finish the medication even if you feel better after you have only taken some of the medication.  Drink enough water and fluids to keep your urine clear or pale yellow.  Avoid caffeine, tea, and carbonated beverages. They tend to irritate your bladder.  Empty your bladder often. Avoid holding urine for long periods of time.  Empty your bladder before and after sexual intercourse.  After a bowel movement, women should cleanse from front to back. Use each tissue only once. SEEK MEDICAL CARE IF:   You have back pain.  You develop a fever.  Your symptoms do not begin to resolve within 3 days. SEEK IMMEDIATE MEDICAL CARE IF:   You have severe back pain or lower abdominal pain.  You develop chills.  You have nausea or vomiting.  You have continued burning or discomfort with urination. MAKE SURE YOU:   Understand these instructions.  Will watch your condition.  Will get help right away if you are not doing well or get worse.   This information is not intended to replace advice given to you by your health care provider. Make sure you discuss any questions you  have with your health care provider.   Document Released: 11/04/2004 Document Revised: 10/16/2014 Document Reviewed: 03/05/2011 Elsevier Interactive Patient Education [employer].

## 2015-12-23 NOTE — Progress Notes (Signed)
Subjective:    Patient ID: Tiffany Acevedo, female    DOB: Jan 12, 1966, 50 y.o.   MRN: [id number]  CC: Tiffany Acevedo is a 50 y.o. female who presents today for an acute visit.    HPI: Patient here for acute visit with chief complaint of dysuria which started 5 days ago, slightly improved. Endores urinary frequency and urine odor. No hematuria, flank pain, fever, changes in vaginal discharge. H/o of recurrent UTIs. Many years ago had to be on prophylactic macrobid. Had 3 UTI 3 weeks ago. Reports 4 UTIs in 2017.     HISTORY:  History reviewed. No pertinent past medical history. History reviewed. No pertinent surgical history. Family History  Problem Relation Age of Onset  . Breast cancer Neg Hx     Allergies: Percocet [oxycodone-acetaminophen] and Sulfonamide derivatives Current Outpatient Prescriptions on File Prior to Visit  Medication Sig Dispense Refill  . bisoprolol-hydrochlorothiazide (ZIAC) 5-6.25 MG per tablet TAKE ONE TABLET BY MOUTH EVERY DAY 90 tablet 1  . fluconazole (DIFLUCAN) 150 MG tablet Take 1 tablet (150 mg total) by mouth daily. FOR 2 DAYS 2 tablet 0  . ibuprofen (ADVIL,MOTRIN) 800 MG tablet Take 800 mg by mouth as needed.    Marland Kitchen imipramine (TOFRANIL) 50 MG tablet Take 50 mg by mouth at bedtime.     . ondansetron (ZOFRAN ODT) 4 MG disintegrating tablet Take 1 tablet (4 mg total) by mouth every 8 (eight) hours as needed for nausea or vomiting. 20 tablet 0  . rizatriptan (MAXALT) 10 MG tablet Take 10 mg by mouth as needed for migraine.     . tamsulosin (FLOMAX) 0.4 MG CAPS capsule Take 1 capsule (0.4 mg total) by mouth daily. 7 capsule 0   No current facility-administered medications on file prior to visit.     Social History  Substance Use Topics  . Smoking status: Never Smoker  . Smokeless tobacco: Never Used  . Alcohol use 0.0 oz/week     Comment: glass of wine occassionally    Review of Systems  Constitutional: Negative for chills and fever.  Respiratory:  Negative for cough.   Cardiovascular: Negative for chest pain and palpitations.  Gastrointestinal: Negative for nausea and vomiting.  Genitourinary: Positive for dysuria and frequency. Negative for flank pain and hematuria.      Objective:    BP 126/70   Pulse 82   Temp 98.4 F (36.9 C) (Oral)   Ht 5\' 4"  (1.626 m)   Wt 159 lb 3.2 oz (72.2 kg)   LMP 12/13/2015 (Exact Date)   SpO2 98%   BMI 27.33 kg/m    Physical Exam  Constitutional: She appears well-developed and well-nourished.  Cardiovascular: Normal rate, regular rhythm, normal heart sounds and normal pulses.   Pulmonary/Chest: Effort normal and breath sounds normal. She has no wheezes. She has no rhonchi. She has no rales.  Abdominal: There is no CVA tenderness.  Neurological: She is alert.  Skin: Skin is warm and dry.  Psychiatric: She has a normal mood and affect. Her speech is normal and behavior is normal. Thought content normal.  Vitals reviewed.      Assessment & Plan:   1. Acute cystitis with hematuria UA shows leukocytes and blood. Patient and I decided to treat empirically. Pending urine culture. Patient will follow-up and discuss prophylaxis for frequent UTIs with urologist or PCP.  - cefdinir (OMNICEF) 300 MG capsule; Take 1 capsule (300 mg total) by mouth 2 (two) times daily.  Dispense: 14 capsule;  Refill: 0 - POCT Urinalysis Dipstick - Urine Culture    I have discontinued Ms. Obara's amoxicillin-clavulanate and ciprofloxacin. I am also having her start on cefdinir. Additionally, I am having her maintain her imipramine, rizatriptan, ibuprofen, fluconazole, bisoprolol-hydrochlorothiazide, ondansetron, and tamsulosin.   Meds ordered this encounter  Medications  . cefdinir (OMNICEF) 300 MG capsule    Sig: Take 1 capsule (300 mg total) by mouth 2 (two) times daily.    Dispense:  14 capsule    Refill:  0    Order Specific Question:   Supervising Provider    Answer:   Crecencio Mc [[id number]]    Return  precautions given.   Risks, benefits, and alternatives of the medications and treatment plan prescribed today were discussed, and patient expressed understanding.   Education regarding symptom management and diagnosis given to patient on AVS.  Continue to follow with TULLO, Aris Everts, MD for routine health maintenance.   Lafe Garin and I agreed with plan.   Mable Paris, FNP

## 2015-12-25 LAB — URINE CULTURE

## 2015-12-26 ENCOUNTER — Telehealth: Payer: Self-pay | Admitting: Internal Medicine

## 2015-12-26 NOTE — Telephone Encounter (Signed)
Pt called and stated that she saw M.Arnett on Tuesday and stated that she has been taking the antibiotic as directed. Pt states that it seems to have gotten worse and also developed a yeast infection. Please advise, thank you!  Call pt @ [phone]

## 2015-12-26 NOTE — Telephone Encounter (Signed)
Called patient back states she is still having dysuria and pressure as well as she thinks she is developing yeast infection.  I advised patient we were closed and she should go be evaluated at urgent care clinic.

## 2015-12-28 NOTE — Telephone Encounter (Signed)
Thank you for that advice to patient  Noted.

## 2016-05-11 ENCOUNTER — Ambulatory Visit (INDEPENDENT_AMBULATORY_CARE_PROVIDER_SITE_OTHER): Payer: BC Managed Care – PPO | Admitting: Family

## 2016-05-11 ENCOUNTER — Encounter: Payer: Self-pay | Admitting: Family

## 2016-05-11 VITALS — BP 122/78 | HR 78 | Temp 98.7°F | Ht 64.0 in | Wt 163.0 lb

## 2016-05-11 DIAGNOSIS — R300 Dysuria: Secondary | ICD-10-CM

## 2016-05-11 LAB — POCT URINALYSIS DIPSTICK
Bilirubin, UA: NEGATIVE
Glucose, UA: NEGATIVE
Ketones, UA: NEGATIVE
Nitrite, UA: NEGATIVE
Protein, UA: NEGATIVE
Spec Grav, UA: 1.01 (ref 1.030–1.035)
Urobilinogen, UA: 0.2 (ref ?–2.0)
pH, UA: 5 (ref 5.0–8.0)

## 2016-05-11 NOTE — Progress Notes (Signed)
Pre visit review using our clinic review tool, if applicable. No additional management support is needed unless otherwise documented below in the visit note. 

## 2016-05-11 NOTE — Patient Instructions (Signed)
Drink plenty of water   We are pending the urine culture to know the organism causing the infection and our office will call you with results.   If you symptoms worsen or you have new symptoms, please contact our office, or return to clinic for re evaluation.

## 2016-05-11 NOTE — Progress Notes (Signed)
Subjective:    Patient ID: Tiffany Acevedo, female    DOB: October 21, 1965, 51 y.o.   MRN: [id number]  CC: Tiffany Acevedo is a 51 y.o. female who presents today for an acute visit.    HPI: CC: dysuria 3 days , unchanged.  NO fever, chills, V, N, flank pain.   Last UTI 12/2015.   Does follow with urology and had been on macrobid for UTI ppx about 15 years ago .     HISTORY:  Past Medical History:  Diagnosis Date  . Allergy   . Arthritis   . Depression   . Frequent headaches   . Hypertension   . Migraines    Past Surgical History:  Procedure Laterality Date  . FRACTURE SURGERY     right ankle  . PILONIDAL CYST EXCISION    . TONSILLECTOMY AND ADENOIDECTOMY     Family History  Problem Relation Age of Onset  . Hyperlipidemia Mother   . Hypertension Mother   . Depression Mother   . Alcohol abuse Father   . Hyperlipidemia Father   . Stroke Father   . Hypertension Father   . Depression Father   . Arthritis Maternal Grandmother   . Hypertension Maternal Grandmother   . Depression Maternal Grandmother   . Arthritis Maternal Grandfather   . Stroke Maternal Grandfather   . Hypertension Maternal Grandfather   . Depression Maternal Grandfather   . Breast cancer Paternal Grandmother   . Hypertension Paternal Grandmother   . Depression Paternal Grandmother   . Hypertension Paternal Grandfather   . Depression Paternal Grandfather     Allergies: Percocet [oxycodone-acetaminophen] and Sulfonamide derivatives Current Outpatient Prescriptions on File Prior to Visit  Medication Sig Dispense Refill  . bisoprolol-hydrochlorothiazide (ZIAC) 5-6.25 MG per tablet TAKE ONE TABLET BY MOUTH EVERY DAY 90 tablet 1  . fluconazole (DIFLUCAN) 150 MG tablet Take 1 tablet (150 mg total) by mouth daily. FOR 2 DAYS 2 tablet 0  . ibuprofen (ADVIL,MOTRIN) 800 MG tablet Take 800 mg by mouth as needed.    Marland Kitchen imipramine (TOFRANIL) 50 MG tablet Take 50 mg by mouth at bedtime.     . ondansetron (ZOFRAN  ODT) 4 MG disintegrating tablet Take 1 tablet (4 mg total) by mouth every 8 (eight) hours as needed for nausea or vomiting. 20 tablet 0  . rizatriptan (MAXALT) 10 MG tablet Take 10 mg by mouth as needed for migraine.     . tamsulosin (FLOMAX) 0.4 MG CAPS capsule Take 1 capsule (0.4 mg total) by mouth daily. 7 capsule 0   No current facility-administered medications on file prior to visit.     Social History  Substance Use Topics  . Smoking status: Never Smoker  . Smokeless tobacco: Never Used  . Alcohol use 0.0 oz/week     Comment: glass of wine occassionally    Review of Systems  Constitutional: Negative for chills and fever.  Respiratory: Negative for cough.   Cardiovascular: Negative for chest pain and palpitations.  Gastrointestinal: Negative for nausea and vomiting.  Genitourinary: Positive for dysuria. Negative for flank pain, frequency, hematuria and vaginal discharge.      Objective:    BP 122/78   Pulse 78   Temp 98.7 F (37.1 C) (Oral)   Ht 5\' 4"  (1.626 m)   Wt 163 lb (73.9 kg)   SpO2 98%   BMI 27.98 kg/m    Physical Exam  Constitutional: She appears well-developed and well-nourished.  Cardiovascular: Normal rate, regular rhythm,  normal heart sounds and normal pulses.   Pulmonary/Chest: Effort normal and breath sounds normal. She has no wheezes. She has no rhonchi. She has no rales.  Abdominal: There is no CVA tenderness.  Neurological: She is alert.  Skin: Skin is warm and dry.  Psychiatric: She has a normal mood and affect. Her speech is normal and behavior is normal. Thought content normal.  Vitals reviewed.      Assessment & Plan:   1. Dysuria UA shows moderate blood and leukocytes. Patient afebrile and well appearing. We agreed to wait on urine culture prior to treatment. - POCT Urinalysis Dipstick - Urine Culture    I am having Ms. Megill maintain her imipramine, rizatriptan, ibuprofen, fluconazole, bisoprolol-hydrochlorothiazide, ondansetron,  and tamsulosin.   No orders of the defined types were placed in this encounter.   Return precautions given.   Risks, benefits, and alternatives of the medications and treatment plan prescribed today were discussed, and patient expressed understanding.   Education regarding symptom management and diagnosis given to patient on AVS.  Continue to follow with TULLO, Aris Everts, MD for routine health maintenance.   Lafe Garin and I agreed with plan.   Mable Paris, FNP

## 2016-05-13 ENCOUNTER — Other Ambulatory Visit: Payer: Self-pay | Admitting: Family

## 2016-05-13 DIAGNOSIS — N3000 Acute cystitis without hematuria: Secondary | ICD-10-CM

## 2016-05-13 LAB — URINE CULTURE

## 2016-05-13 MED ORDER — AMOXICILLIN-POT CLAVULANATE 875-125 MG PO TABS: 1.0000 | ORAL_TABLET | Freq: Two times a day (BID) | ORAL | 0 refills | Status: AC

## 2016-06-29 ENCOUNTER — Ambulatory Visit (INDEPENDENT_AMBULATORY_CARE_PROVIDER_SITE_OTHER): Payer: BC Managed Care – PPO | Admitting: Internal Medicine

## 2016-06-29 ENCOUNTER — Encounter: Payer: Self-pay | Admitting: Internal Medicine

## 2016-06-29 VITALS — BP 120/84 | HR 84 | Temp 99.6°F | Resp 14 | Ht 64.0 in | Wt 164.2 lb

## 2016-06-29 DIAGNOSIS — E559 Vitamin D deficiency, unspecified: Secondary | ICD-10-CM | POA: Diagnosis not present

## 2016-06-29 DIAGNOSIS — K5900 Constipation, unspecified: Secondary | ICD-10-CM

## 2016-06-29 DIAGNOSIS — E7800 Pure hypercholesterolemia, unspecified: Secondary | ICD-10-CM | POA: Diagnosis not present

## 2016-06-29 DIAGNOSIS — Z0000 Encounter for general adult medical examination without abnormal findings: Secondary | ICD-10-CM

## 2016-06-29 DIAGNOSIS — Z0001 Encounter for general adult medical examination with abnormal findings: Secondary | ICD-10-CM

## 2016-06-29 DIAGNOSIS — Z23 Encounter for immunization: Secondary | ICD-10-CM

## 2016-06-29 DIAGNOSIS — N3090 Cystitis, unspecified without hematuria: Secondary | ICD-10-CM

## 2016-06-29 DIAGNOSIS — F411 Generalized anxiety disorder: Secondary | ICD-10-CM | POA: Diagnosis not present

## 2016-06-29 DIAGNOSIS — R635 Abnormal weight gain: Secondary | ICD-10-CM

## 2016-06-29 DIAGNOSIS — R5383 Other fatigue: Secondary | ICD-10-CM | POA: Diagnosis not present

## 2016-06-29 MED ORDER — VENLAFAXINE HCL ER 37.5 MG PO CP24: 37.5000 mg | ORAL_CAPSULE | Freq: Every day | ORAL | 1 refills | Status: DC

## 2016-06-29 NOTE — Progress Notes (Signed)
Patient ID: Tiffany Acevedo, female    DOB: 06/21/1965  Age: 51 y.o. MRN: [id number]  The patient is here for annual non gyn  examination and management of other chronic and acute problems.   The risk factors are reflected in the social history.  The roster of all physicians providing medical care to patient - is listed in the Snapshot section of the chart.  Activities of daily living:  The patient is 100% independent in all ADLs: dressing, toileting, feeding as well as independent mobility  Home safety : The patient has smoke detectors in the home. They wear seatbelts.  There are no firearms at home. There is no violence in the home.   There is no risks for hepatitis, STDs or HIV. There is no   history of blood transfusion. They have no travel history to infectious disease endemic areas of the world.  The patient has seen their dentist in the last six month. They have seen their eye doctor in the last year. They admit to slight hearing difficulty with regard to whispered voices and some television programs.  They have deferred audiologic testing in the last year.  They do not  have excessive sun exposure. Discussed the need for sun protection: hats, long sleeves and use of sunscreen if there is significant sun exposure.   Diet: the importance of a healthy diet is discussed. They do have a healthy diet.  The benefits of regular aerobic exercise were discussed. She walks 4 times per week ,  20 minutes.   Depression screen: there are no signs or vegative symptoms of depression- irritability, change in appetite, anhedonia, sadness/tearfullness.  Cognitive assessment: the patient manages all their financial and personal affairs and is actively engaged. They could relate day,date,year and events; recalled 2/3 objects at 3 minutes; performed clock-face test normally.  The following portions of the patient's history were reviewed and updated as appropriate: allergies, current medications, past family  history, past medical history,  past surgical history, past social history  and problem list.  Visual acuity was not assessed per patient preference since she has regular follow up with her ophthalmologist. Hearing and body mass index were assessed and reviewed.   During the course of the visit the patient was educated and counseled about appropriate screening and preventive services including : fall prevention , diabetes screening, nutrition counseling, colorectal cancer screening, and recommended immunizations.    CC: The primary encounter diagnosis was Fatigue, unspecified type. Diagnoses of Pure hypercholesterolemia, Vitamin D deficiency, Need for Tdap vaccination, Generalized anxiety disorder, Excessive weight gain, Cystitis, and Constipation, unspecified constipation type were also pertinent to this visit.  Last seen Oct 2015 . [facility] [place]  2 acute visits in last 6 months for UTI ,  Both E Coli . Used to be on daily prophylaxis  Years ago by Urology for post coital cystitis. Periods are still occurring regularly, but starting to have night sweats but none in a few months .  Weight gain, abnormal.  Was 125 lbs  5 years ago  Prior to her last pregnancy.   Not exercising regularly,  walks 2 days per week  .  Does not snack or eat after dinnertime.  2) change in bowel habits for the past month.  New onset periodic constipation lasted  5 days increased her water intake and stool regimen transiently relieved.   Last colonoscopy was 2015 one polyp,  Follow up 2020 Vira Agar)  3) intermittent left sided pain in the LLQ  For the past 4 months,   will last for a day or so,  Then resolve for weeks at a time .occasionally sharp.  Told in the past she had scar tissue from C sections .  Has pevlic exam appt this summer discussed getting an ultrasound repeated      4) memory concerns:  Teaches Math in high school,  Has a  52 yr old. No history of snoring,  Feels tired all the time , wakes up  feeling refreshed.   .    History Victorya has a past medical history of Allergy; Arthritis; Depression; Frequent headaches; Hypertension; and Migraines.   She has a past surgical history that includes Fracture surgery; Pilonidal cyst excision; and Tonsillectomy and adenoidectomy.   Her family history includes Alcohol abuse in her father; Arthritis in her maternal grandfather and maternal grandmother; Breast cancer in her paternal grandmother; Depression in her father, maternal grandfather, maternal grandmother, mother, paternal grandfather, and paternal grandmother; Hyperlipidemia in her father and mother; Hypertension in her father, maternal grandfather, maternal grandmother, mother, paternal grandfather, and paternal grandmother; Stroke in her father and maternal grandfather.She reports that she has never smoked. She has never used smokeless tobacco. She reports that she drinks alcohol. She reports that she does not use drugs.  Outpatient Medications Prior to Visit  Medication Sig Dispense Refill  . ibuprofen (ADVIL,MOTRIN) 800 MG tablet Take 800 mg by mouth as needed.    Marland Kitchen imipramine (TOFRANIL) 50 MG tablet Take 50 mg by mouth at bedtime.     . ondansetron (ZOFRAN ODT) 4 MG disintegrating tablet Take 1 tablet (4 mg total) by mouth every 8 (eight) hours as needed for nausea or vomiting. 20 tablet 0  . rizatriptan (MAXALT) 10 MG tablet Take 10 mg by mouth as needed for migraine.     . bisoprolol-hydrochlorothiazide (ZIAC) 5-6.25 MG per tablet TAKE ONE TABLET BY MOUTH EVERY DAY (Patient not taking: Reported on 06/29/2016) 90 tablet 1  . fluconazole (DIFLUCAN) 150 MG tablet Take 1 tablet (150 mg total) by mouth daily. FOR 2 DAYS (Patient not taking: Reported on 06/29/2016) 2 tablet 0  . tamsulosin (FLOMAX) 0.4 MG CAPS capsule Take 1 capsule (0.4 mg total) by mouth daily. (Patient not taking: Reported on 06/29/2016) 7 capsule 0   No facility-administered medications prior to visit.     Review of  Systems   Patient denies headache, fevers, malaise, unintentional weight loss, skin rash, eye pain, sinus congestion and sinus pain, sore throat, dysphagia,  hemoptysis , cough, dyspnea, wheezing, chest pain, palpitations, orthopnea, edema, abdominal pain, nausea, melena, diarrhea, constipation, flank pain, dysuria, hematuria, urinary  Frequency, nocturia, numbness, tingling, seizures,  Focal weakness, Loss of consciousness,  Tremor, insomnia, depression, anxiety, and suicidal ideation.      Objective:  BP 120/84 (BP Location: Left Arm, Patient Position: Sitting, Cuff Size: Normal)   Pulse 84   Temp 99.6 F (37.6 C) (Oral)   Resp 14   Ht 5\' 4"  (1.626 m)   Wt 164 lb 3.2 oz (74.5 kg)   SpO2 98%   BMI 28.18 kg/m   Physical Exam   General Appearance:    Alert, cooperative, no distress, appears stated age  Head:    Normocephalic, without obvious abnormality, atraumatic  Eyes:    PERRL, conjunctiva/corneas clear, EOM's intact, fundi    benign, both eyes  Ears:    Normal TM's and external ear canals, both ears  Nose:   Nares normal, septum midline, mucosa normal, no drainage  or sinus tenderness  Throat:   Lips, mucosa, and tongue normal; teeth and gums normal  Neck:   Supple, symmetrical, trachea midline, no adenopathy;    thyroid:  no enlargement/tenderness/nodules; no carotid   bruit or JVD  Back:     Symmetric, no curvature, ROM normal, no CVA tenderness  Lungs:     Clear to auscultation bilaterally, respirations unlabored  Chest Wall:    No tenderness or deformity   Heart:    Regular rate and rhythm, S1 and S2 normal, no murmur, rub   or gallop  Breast Exam:    No tenderness, masses, or nipple abnormality  Abdomen:     Soft, non-tender, bowel sounds active all four quadrants,    no masses, no organomegaly  Genitalia:    Pelvic: cervix normal in appearance, external genitalia normal, no adnexal masses or tenderness, no cervical motion tenderness, rectovaginal septum normal,  uterus normal size, shape, and consistency and vagina normal without discharge  Extremities:   Extremities normal, atraumatic, no cyanosis or edema  Pulses:   2+ and symmetric all extremities  Skin:   Skin color, texture, turgor normal, no rashes or lesions  Lymph nodes:   Cervical, supraclavicular, and axillary nodes normal  Neurologic:   CNII-XII intact, normal strength, sensation and reflexes    throughout      Assessment & Plan:   Problem List Items Addressed This Visit    Generalized anxiety disorder    LIKELY THE CAUSE OF HER  decreased concentration.  Trial of effexor offered       Excessive weight gain    . I have addressed  smaller more frequent meals to increase metabolism.  I have also recommended that patient start exercising with a goal of 30 minutes of aerobic exercise a minimum of 5 days per week. Screening for lipid disorders, thyroid and diabetes to be done today.  Lab Results  Component Value Date   CHOL 196 06/29/2016   HDL 48.70 06/29/2016   LDLCALC 115 (H) 06/29/2016   TRIG 165.0 (H) 06/29/2016   CHOLHDL 4 06/29/2016     Lab Results  Component Value Date   TSH 0.56 06/29/2016         Cystitis    Post coital ,  2 episodes in last  Months,  No menopausal.  Trial of cranberry tablets daily , amy add single dose of  abx the evening of intercourse       Constipation    Recommended an increase in fiber intake to 25 to 35 grams daily.  Made several dietary suggestions of high fiber content including [place] low carb whole wheat tortillas which have 26 g fiber/serving, [place] flatbread which has around 10 g fiber,   Daily serving of any type of nuts,  and Atkins protein bars which have 8 to 10 g fiber.   Dispelled the popularly held notion that [employer] oatmeal, whole grain bread and most commericially made cereals have adequate fiber.  Finally I recommended daily use of Miralax., metamucil, citrucel, benefiber  or fibercon.        Other Visit  Diagnoses    Fatigue, unspecified type    -  Primary   Relevant Orders   TSH (Completed)   Comprehensive metabolic panel (Completed)   Vitamin B12 (Completed)   CBC with Differential/Platelet (Completed)   Pure hypercholesterolemia       Relevant Medications   metoprolol succinate (TOPROL-XL) 100 MG 24 hr tablet   Other Relevant Orders  Lipid panel (Completed)   Vitamin D deficiency       Relevant Orders   VITAMIN D 25 Hydroxy (Vit-D Deficiency, Fractures) (Completed)   Need for Tdap vaccination       Relevant Orders   Tdap vaccine greater than or equal to 7yo IM (Completed)      I have discontinued Ms. Trieu's fluconazole, bisoprolol-hydrochlorothiazide, and tamsulosin. I am also having her start on venlafaxine XR. Additionally, I am having her maintain her imipramine, rizatriptan, ibuprofen, ondansetron, ketorolac, and metoprolol succinate.  Meds ordered this encounter  Medications  . ketorolac (TORADOL) 10 MG tablet  . metoprolol succinate (TOPROL-XL) 100 MG 24 hr tablet    Sig: Take by mouth.  . venlafaxine XR (EFFEXOR-XR) 37.5 MG 24 hr capsule    Sig: Take 1 capsule (37.5 mg total) by mouth daily with breakfast.    Dispense:  90 capsule    Refill:  1    Medications Discontinued During This Encounter  Medication Reason  . bisoprolol-hydrochlorothiazide (ZIAC) 5-6.25 MG per tablet Patient has not taken in last 30 days  . fluconazole (DIFLUCAN) 150 MG tablet Patient has not taken in last 30 days  . tamsulosin (FLOMAX) 0.4 MG CAPS capsule Patient has not taken in last 30 days    Follow-up: Return in about 2 months (around 08/29/2016).   Crecencio Mc, MD

## 2016-06-29 NOTE — Patient Instructions (Addendum)
I recommend a trial of generic Effexor to help your general mood. Start with 1 tablet daily for the first 2 weeks.  You may increase to 2 tablets daily if you feel no improvement after 2 weeks of the lower dose  I'll see you back in 2 months   Health Maintenance, Female Adopting a healthy lifestyle and getting preventive care can go a long way to promote health and wellness. Talk with your health care provider about what schedule of regular examinations is right for you. This is a good chance for you to check in with your provider about disease prevention and staying healthy. In between checkups, there are plenty of things you can do on your own. Experts have done a lot of research about which lifestyle changes and preventive measures are most likely to keep you healthy. Ask your health care provider for more information. Weight and diet Eat a healthy diet  Be sure to include plenty of vegetables, fruits, low-fat dairy products, and lean protein.  Do not eat a lot of foods high in solid fats, added sugars, or salt.  Get regular exercise. This is one of the most important things you can do for your health.  Most adults should exercise for at least 150 minutes each week. The exercise should increase your heart rate and make you sweat (moderate-intensity exercise).  Most adults should also do strengthening exercises at least twice a week. This is in addition to the moderate-intensity exercise. Maintain a healthy weight  Body mass index (BMI) is a measurement that can be used to identify possible weight problems. It estimates body fat based on height and weight. Your health care provider can help determine your BMI and help you achieve or maintain a healthy weight.  For females 35 years of age and older:  A BMI below 18.5 is considered underweight.  A BMI of 18.5 to 24.9 is normal.  A BMI of 25 to 29.9 is considered overweight.  A BMI of 30 and above is considered obese. Watch levels of  cholesterol and blood lipids  You should start having your blood tested for lipids and cholesterol at 52 years of age, then have this test every 5 years.  You may need to have your cholesterol levels checked more often if:  Your lipid or cholesterol levels are high.  You are older than 52 years of age.  You are at high risk for heart disease. Cancer screening Lung Cancer  Lung cancer screening is recommended for adults 75-58 years old who are at high risk for lung cancer because of a history of smoking.  A yearly low-dose CT scan of the lungs is recommended for people who:  Currently smoke.  Have quit within the past 15 years.  Have at least a 30-pack-year history of smoking. A pack year is smoking an average of one pack of cigarettes a day for 1 year.  Yearly screening should continue until it has been 15 years since you quit.  Yearly screening should stop if you develop a health problem that would prevent you from having lung cancer treatment. Breast Cancer  Practice breast self-awareness. This means understanding how your breasts normally appear and feel.  It also means doing regular breast self-exams. Let your health care provider know about any changes, no matter how small.  If you are in your 20s or 30s, you should have a clinical breast exam (CBE) by a health care provider every 1-3 years as part of a regular health  exam.  If you are 40 or older, have a CBE every year. Also consider having a breast X-ray (mammogram) every year.  If you have a family history of breast cancer, talk to your health care provider about genetic screening.  If you are at high risk for breast cancer, talk to your health care provider about having an MRI and a mammogram every year.  Breast cancer gene (BRCA) assessment is recommended for women who have family members with BRCA-related cancers. BRCA-related cancers include:  Breast.  Ovarian.  Tubal.  Peritoneal cancers.  Results of  the assessment will determine the need for genetic counseling and BRCA1 and BRCA2 testing. Cervical Cancer  Your health care provider may recommend that you be screened regularly for cancer of the pelvic organs (ovaries, uterus, and vagina). This screening involves a pelvic examination, including checking for microscopic changes to the surface of your cervix (Pap test). You may be encouraged to have this screening done every 3 years, beginning at age 26.  For women ages 29-65, health care providers may recommend pelvic exams and Pap testing every 3 years, or they may recommend the Pap and pelvic exam, combined with testing for human papilloma virus (HPV), every 5 years. Some types of HPV increase your risk of cervical cancer. Testing for HPV may also be done on women of any age with unclear Pap test results.  Other health care providers may not recommend any screening for nonpregnant women who are considered low risk for pelvic cancer and who do not have symptoms. Ask your health care provider if a screening pelvic exam is right for you.  If you have had past treatment for cervical cancer or a condition that could lead to cancer, you need Pap tests and screening for cancer for at least 20 years after your treatment. If Pap tests have been discontinued, your risk factors (such as having a new sexual partner) need to be reassessed to determine if screening should resume. Some women have medical problems that increase the chance of getting cervical cancer. In these cases, your health care provider may recommend more frequent screening and Pap tests. Colorectal Cancer  This type of cancer can be detected and often prevented.  Routine colorectal cancer screening usually begins at 52 years of age and continues through 52 years of age.  Your health care provider may recommend screening at an earlier age if you have risk factors for colon cancer.  Your health care provider may also recommend using home test  kits to check for hidden blood in the stool.  A small camera at the end of a tube can be used to examine your colon directly (sigmoidoscopy or colonoscopy). This is done to check for the earliest forms of colorectal cancer.  Routine screening usually begins at age 65.  Direct examination of the colon should be repeated every 5-10 years through 52 years of age. However, you may need to be screened more often if early forms of precancerous polyps or small growths are found. Skin Cancer  Check your skin from head to toe regularly.  Tell your health care provider about any new moles or changes in moles, especially if there is a change in a mole's shape or color.  Also tell your health care provider if you have a mole that is larger than the size of a pencil eraser.  Always use sunscreen. Apply sunscreen liberally and repeatedly throughout the day.  Protect yourself by wearing long sleeves, pants, a wide-brimmed hat, and  sunglasses whenever you are outside. Heart disease, diabetes, and high blood pressure  High blood pressure causes heart disease and increases the risk of stroke. High blood pressure is more likely to develop in:  People who have blood pressure in the high end of the normal range (130-139/85-89 mm Hg).  People who are overweight or obese.  People who are African American.  If you are 5-46 years of age, have your blood pressure checked every 3-5 years. If you are 65 years of age or older, have your blood pressure checked every year. You should have your blood pressure measured twice-once when you are at a hospital or clinic, and once when you are not at a hospital or clinic. Record the average of the two measurements. To check your blood pressure when you are not at a hospital or clinic, you can use:  An automated blood pressure machine at a pharmacy.  A home blood pressure monitor.  If you are between 57 years and 75 years old, ask your health care provider if you should  take aspirin to prevent strokes.  Have regular diabetes screenings. This involves taking a blood sample to check your fasting blood sugar level.  If you are at a normal weight and have a low risk for diabetes, have this test once every three years after 52 years of age.  If you are overweight and have a high risk for diabetes, consider being tested at a younger age or more often. Preventing infection Hepatitis B  If you have a higher risk for hepatitis B, you should be screened for this virus. You are considered at high risk for hepatitis B if:  You were born in a country where hepatitis B is common. Ask your health care provider which countries are considered high risk.  Your parents were born in a high-risk country, and you have not been immunized against hepatitis B (hepatitis B vaccine).  You have HIV or AIDS.  You use needles to inject street drugs.  You live with someone who has hepatitis B.  You have had sex with someone who has hepatitis B.  You get hemodialysis treatment.  You take certain medicines for conditions, including cancer, organ transplantation, and autoimmune conditions. Hepatitis C  Blood testing is recommended for:  Everyone born from [birth year] through 1966.  Anyone with known risk factors for hepatitis C. Sexually transmitted infections (STIs)  You should be screened for sexually transmitted infections (STIs) including gonorrhea and chlamydia if:  You are sexually active and are younger than 52 years of age.  You are older than 52 years of age and your health care provider tells you that you are at risk for this type of infection.  Your sexual activity has changed since you were last screened and you are at an increased risk for chlamydia or gonorrhea. Ask your health care provider if you are at risk.  If you do not have HIV, but are at risk, it may be recommended that you take a prescription medicine daily to prevent HIV infection. This is called  pre-exposure prophylaxis (PrEP). You are considered at risk if:  You are sexually active and do not regularly use condoms or know the HIV status of your partner(s).  You take drugs by injection.  You are sexually active with a partner who has HIV. Talk with your health care provider about whether you are at high risk of being infected with HIV. If you choose to begin PrEP, you should first be tested  for HIV. You should then be tested every 3 months for as long as you are taking PrEP. Pregnancy  If you are premenopausal and you may become pregnant, ask your health care provider about preconception counseling.  If you may become pregnant, take 400 to 800 micrograms (mcg) of folic acid every day.  If you want to prevent pregnancy, talk to your health care provider about birth control (contraception). Osteoporosis and menopause  Osteoporosis is a disease in which the bones lose minerals and strength with aging. This can result in serious bone fractures. Your risk for osteoporosis can be identified using a bone density scan.  If you are 16 years of age or older, or if you are at risk for osteoporosis and fractures, ask your health care provider if you should be screened.  Ask your health care provider whether you should take a calcium or vitamin D supplement to lower your risk for osteoporosis.  Menopause may have certain physical symptoms and risks.  Hormone replacement therapy may reduce some of these symptoms and risks. Talk to your health care provider about whether hormone replacement therapy is right for you. Follow these instructions at home:  Schedule regular health, dental, and eye exams.  Stay current with your immunizations.  Do not use any tobacco products including cigarettes, chewing tobacco, or electronic cigarettes.  If you are pregnant, do not drink alcohol.  If you are breastfeeding, limit how much and how often you drink alcohol.  Limit alcohol intake to no more  than 1 drink per day for nonpregnant women. One drink equals 12 ounces of beer, 5 ounces of wine, or 1 ounces of hard liquor.  Do not use street drugs.  Do not share needles.  Ask your health care provider for help if you need support or information about quitting drugs.  Tell your health care provider if you often feel depressed.  Tell your health care provider if you have ever been abused or do not feel safe at home. This information is not intended to replace advice given to you by your health care provider. Make sure you discuss any questions you have with your health care provider. Document Released: 08/10/2010 Document Revised: 07/03/2015 Document Reviewed: 10/29/2014 Elsevier Interactive Patient Education  2017 [employer].

## 2016-06-30 DIAGNOSIS — R635 Abnormal weight gain: Secondary | ICD-10-CM

## 2016-06-30 DIAGNOSIS — Z0000 Encounter for general adult medical examination without abnormal findings: Secondary | ICD-10-CM | POA: Insufficient documentation

## 2016-06-30 DIAGNOSIS — K5900 Constipation, unspecified: Secondary | ICD-10-CM | POA: Insufficient documentation

## 2016-06-30 HISTORY — DX: Abnormal weight gain: R63.5

## 2016-06-30 LAB — LIPID PANEL
Cholesterol: 196 mg/dL (ref 0–200)
HDL: 48.7 mg/dL (ref >39.00)
LDL Cholesterol: 115 mg/dL — ABNORMAL HIGH (ref 0–99)
NonHDL: 147.63
Total CHOL/HDL Ratio: 4
Triglycerides: 165 mg/dL — ABNORMAL HIGH (ref 0.0–149.0)
VLDL: 33 mg/dL (ref 0.0–40.0)

## 2016-06-30 LAB — CBC WITH DIFFERENTIAL/PLATELET
Basophils Absolute: 0.1 10*3/uL (ref 0.0–0.1)
Basophils Relative: 1.2 % (ref 0.0–3.0)
Eosinophils Absolute: 0.3 10*3/uL (ref 0.0–0.7)
Eosinophils Relative: 2.4 % (ref 0.0–5.0)
HCT: 38.1 % (ref 36.0–46.0)
Hemoglobin: 12.6 g/dL (ref 12.0–15.0)
Lymphocytes Relative: 28.6 % (ref 12.0–46.0)
Lymphs Abs: 3.4 10*3/uL (ref 0.7–4.0)
MCHC: 33.2 g/dL (ref 30.0–36.0)
MCV: 87.3 fl (ref 78.0–100.0)
Monocytes Absolute: 1 10*3/uL (ref 0.1–1.0)
Monocytes Relative: 8 % (ref 3.0–12.0)
Neutro Abs: 7.2 10*3/uL (ref 1.4–7.7)
Neutrophils Relative %: 59.8 % (ref 43.0–77.0)
Platelets: 242 10*3/uL (ref 150.0–400.0)
RBC: 4.36 Mil/uL (ref 3.87–5.11)
RDW: 13.8 % (ref 11.5–15.5)
WBC: 12.1 10*3/uL — ABNORMAL HIGH (ref 4.0–10.5)

## 2016-06-30 LAB — TSH: TSH: 0.56 u[IU]/mL (ref 0.35–4.50)

## 2016-06-30 LAB — COMPREHENSIVE METABOLIC PANEL
ALT: 15 U/L (ref 0–35)
AST: 14 U/L (ref 0–37)
Albumin: 4.3 g/dL (ref 3.5–5.2)
Alkaline Phosphatase: 74 U/L (ref 39–117)
BUN: 17 mg/dL (ref 6–23)
CO2: 27 mEq/L (ref 19–32)
Calcium: 9.7 mg/dL (ref 8.4–10.5)
Chloride: 104 mEq/L (ref 96–112)
Creatinine, Ser: 0.82 mg/dL (ref 0.40–1.20)
GFR: 77.93 mL/min (ref >60.00)
Glucose, Bld: 102 mg/dL — ABNORMAL HIGH (ref 70–99)
Potassium: 4.3 mEq/L (ref 3.5–5.1)
Sodium: 138 mEq/L (ref 135–145)
Total Bilirubin: 0.3 mg/dL (ref 0.2–1.2)
Total Protein: 6.8 g/dL (ref 6.0–8.3)

## 2016-06-30 LAB — VITAMIN D 25 HYDROXY (VIT D DEFICIENCY, FRACTURES): VITD: 18.22 ng/mL — ABNORMAL LOW (ref 30.00–100.00)

## 2016-06-30 LAB — VITAMIN B12: Vitamin B-12: 591 pg/mL (ref 211–911)

## 2016-06-30 NOTE — Assessment & Plan Note (Signed)
Annual comprehensive preventive exam was done as well as an evaluation and management of chronic conditions .  During the course of the visit the patient was educated and counseled about appropriate screening and preventive services including :  diabetes screening, lipid analysis with projected  10 year  risk for CAD , nutrition counseling, breast, cervical and colorectal cancer screening, and recommended immunizations.  Printed recommendations for health maintenance screenings was give 

## 2016-06-30 NOTE — Assessment & Plan Note (Signed)
LIKELY THE CAUSE OF HER  decreased concentration.  Trial of effexor offered

## 2016-06-30 NOTE — Assessment & Plan Note (Signed)
Post coital ,  2 episodes in last  Months,  No menopausal.  Trial of cranberry tablets daily , amy add single dose of  abx the evening of intercourse

## 2016-06-30 NOTE — Assessment & Plan Note (Signed)
Recommended an increase in fiber intake to 25 to 35 grams daily.  Made several dietary suggestions of high fiber content including Mission low carb whole wheat tortillas which have 26 g fiber/serving, Touf[place] flatbread which has around 10 g fiber,   Daily serving of any type of nuts,  and Atkins protein bars which have 8 to 10 g fiber.   Dispelled the popularly held notion that Quaker oats oatmeal, whole grain bread and most commericially made cereals have adequate fiber.  Finally I recommended daily use of Miralax., metamucil, citrucel, benefiber  or fibercon.  

## 2016-06-30 NOTE — Assessment & Plan Note (Signed)
.   I have addressed  smaller more frequent meals to increase metabolism.  I have also recommended that patient start exercising with a goal of 30 minutes of aerobic exercise a minimum of 5 days per week. Screening for lipid disorders, thyroid and diabetes to be done today.  Lab Results  Component Value Date   CHOL 196 06/29/2016   HDL 48.70 06/29/2016   LDLCALC 115 (H) 06/29/2016   TRIG 165.0 (H) 06/29/2016   CHOLHDL 4 06/29/2016     Lab Results  Component Value Date   TSH 0.56 06/29/2016

## 2016-07-01 ENCOUNTER — Other Ambulatory Visit: Payer: Self-pay | Admitting: Internal Medicine

## 2016-07-01 DIAGNOSIS — D72829 Elevated white blood cell count, unspecified: Secondary | ICD-10-CM

## 2016-07-04 ENCOUNTER — Encounter: Payer: Self-pay | Admitting: Internal Medicine

## 2016-07-21 ENCOUNTER — Encounter: Payer: Self-pay | Admitting: Internal Medicine

## 2016-07-21 ENCOUNTER — Other Ambulatory Visit: Payer: Self-pay | Admitting: Internal Medicine

## 2016-07-21 MED ORDER — ERGOCALCIFEROL 1.25 MG (50000 UT) PO CAPS: 50000.0000 [IU] | ORAL_CAPSULE | ORAL | 2 refills | Status: DC

## 2016-07-23 ENCOUNTER — Ambulatory Visit: Payer: BC Managed Care – PPO | Admitting: Hematology and Oncology

## 2016-08-19 ENCOUNTER — Inpatient Hospital Stay: Payer: BC Managed Care – PPO | Attending: Hematology and Oncology | Admitting: Hematology and Oncology

## 2016-08-19 ENCOUNTER — Inpatient Hospital Stay: Payer: BC Managed Care – PPO

## 2016-08-19 ENCOUNTER — Encounter: Payer: Self-pay | Admitting: Hematology and Oncology

## 2016-08-19 DIAGNOSIS — Z79899 Other long term (current) drug therapy: Secondary | ICD-10-CM | POA: Insufficient documentation

## 2016-08-19 DIAGNOSIS — M129 Arthropathy, unspecified: Secondary | ICD-10-CM

## 2016-08-19 DIAGNOSIS — Z806 Family history of leukemia: Secondary | ICD-10-CM | POA: Insufficient documentation

## 2016-08-19 DIAGNOSIS — Z87442 Personal history of urinary calculi: Secondary | ICD-10-CM | POA: Diagnosis not present

## 2016-08-19 DIAGNOSIS — R1032 Left lower quadrant pain: Secondary | ICD-10-CM

## 2016-08-19 DIAGNOSIS — F329 Major depressive disorder, single episode, unspecified: Secondary | ICD-10-CM | POA: Insufficient documentation

## 2016-08-19 DIAGNOSIS — D72829 Elevated white blood cell count, unspecified: Secondary | ICD-10-CM | POA: Insufficient documentation

## 2016-08-19 DIAGNOSIS — Z8042 Family history of malignant neoplasm of prostate: Secondary | ICD-10-CM | POA: Insufficient documentation

## 2016-08-19 DIAGNOSIS — Z87440 Personal history of urinary (tract) infections: Secondary | ICD-10-CM | POA: Diagnosis not present

## 2016-08-19 DIAGNOSIS — J00 Acute nasopharyngitis [common cold]: Secondary | ICD-10-CM | POA: Insufficient documentation

## 2016-08-19 DIAGNOSIS — I10 Essential (primary) hypertension: Secondary | ICD-10-CM | POA: Diagnosis not present

## 2016-08-19 DIAGNOSIS — F419 Anxiety disorder, unspecified: Secondary | ICD-10-CM

## 2016-08-19 DIAGNOSIS — Z800 Family history of malignant neoplasm of digestive organs: Secondary | ICD-10-CM | POA: Diagnosis not present

## 2016-08-19 LAB — CBC WITH DIFFERENTIAL/PLATELET
Basophils Absolute: 0.1 10*3/uL (ref 0–0.1)
Basophils Relative: 1 %
Eosinophils Absolute: 0.2 10*3/uL (ref 0–0.7)
Eosinophils Relative: 2 %
HCT: 37.4 % (ref 35.0–47.0)
Hemoglobin: 12.8 g/dL (ref 12.0–16.0)
Lymphocytes Relative: 23 %
Lymphs Abs: 2.4 10*3/uL (ref 1.0–3.6)
MCH: 29.6 pg (ref 26.0–34.0)
MCHC: 34.3 g/dL (ref 32.0–36.0)
MCV: 86.4 fL (ref 80.0–100.0)
Monocytes Absolute: 0.8 10*3/uL (ref 0.2–0.9)
Monocytes Relative: 7 %
Neutro Abs: 7.1 10*3/uL — ABNORMAL HIGH (ref 1.4–6.5)
Neutrophils Relative %: 67 %
Platelets: 231 10*3/uL (ref 150–440)
RBC: 4.33 MIL/uL (ref 3.80–5.20)
RDW: 13.5 % (ref 11.5–14.5)
WBC: 10.6 10*3/uL (ref 3.6–11.0)

## 2016-08-19 LAB — C-REACTIVE PROTEIN: CRP: 1.1 mg/dL — ABNORMAL HIGH (ref <1.0)

## 2016-08-19 LAB — SEDIMENTATION RATE: Sed Rate: 37 mm/hr — ABNORMAL HIGH (ref 0–30)

## 2016-08-19 NOTE — Progress Notes (Signed)
[facility] day:  08/19/2016  Chief Complaint: Tiffany Acevedo is a 51 y.o. female with leukocytosis who is referred by Dr. Deborra Medina in consultation for assessment and management.  HPI: The patient notes an elevated white blood cell count for an unknown length of time. She states it's been "that way for a while". Last time labs were drawn when she had a physical exam.  She denies any fevers, sweats or weight loss.  She has gained weight in the past year.  She denies any exposure to radiation or toxins.    She notes steroids prescribed by Dr. Manuella Ghazi approximately 2 times a year.  She denies any infections except for UTIs about 2-4 times a year.  Patient has available CBCs dating back to 03/28/2006. White count has been mildly elevated ranging between 11,000 and 13,800 between 03/12/2010 - 04/07/2015.  Differential has been predominantly neutrophils.    CBC on 06/29/2016 revealed a hematocrit 38.1, hemoglobin 12.6, MCV 87.3, platelets 242,000, white count 12,100 with an ANC of 7200.  Differential was unremarkable.  Comprehensive metabolic panel was normal.  B12 was 591.  TSH was 0.56.  Symptomatically, she notes occasional LLQ pain.  She has had cold symptoms for the past week.  Her mother had colon cancer at age 74.  She has colonoscopies every 5 years.  Last colonoscopy on 03/22/2013 revealed a polyp in the descending colon.  Her father had prostate cancer.  Her maternal aunt had leukemia.   Past Medical History:  Diagnosis Date  . Allergy   . Anxiety   . Arthritis   . Depression   . Frequent headaches   . Hypertension   . Migraines   . Renal calculi     Past Surgical History:  Procedure Laterality Date  . [place], 2004, 2013  . COLONOSCOPY  2015  . FRACTURE SURGERY     right ankle  . INCONTINENCE SURGERY    . PILONIDAL CYST EXCISION    . TONSILLECTOMY AND ADENOIDECTOMY      Family History  Problem Relation Age of  Onset  . Hyperlipidemia Mother   . Hypertension Mother   . Depression Mother   . Cancer Mother   . Alcohol abuse Father   . Hyperlipidemia Father   . Stroke Father   . Hypertension Father   . Depression Father   . Cancer Father   . Arthritis Maternal Grandmother   . Hypertension Maternal Grandmother   . Depression Maternal Grandmother   . Arthritis Maternal Grandfather   . Stroke Maternal Grandfather   . Hypertension Maternal Grandfather   . Depression Maternal Grandfather   . Breast cancer Paternal Grandmother   . Hypertension Paternal Grandmother   . Depression Paternal Grandmother   . Hypertension Paternal Grandfather   . Depression Paternal Grandfather     Social History:  reports that she has never smoked. She has never used smokeless tobacco. She reports that she drinks alcohol. She reports that she does not use drugs.  She denies any exposure to radiation or toxins.  She lives in [place].  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  She has children (age 23-24).  The patient is accompanied by her husband, Gaspar Bidding, today.  Allergies:  Allergies  Allergen Reactions  . Percocet [Oxycodone-Acetaminophen] Other (See Comments)  . Sulfa Antibiotics Other (See Comments)  . Sulfonamide Derivatives     REACTION: unspecified    Current Medications: Current  Outpatient Prescriptions  Medication Sig Dispense Refill  . AZO-CRANBERRY PO Take 1 tablet by mouth daily.    Marland Kitchen ibuprofen (ADVIL,MOTRIN) 800 MG tablet Take 800 mg by mouth as needed.    Marland Kitchen imipramine (TOFRANIL) 50 MG tablet Take 50 mg by mouth at bedtime.     . metoprolol succinate (TOPROL-XL) 100 MG 24 hr tablet Take by mouth.    . ondansetron (ZOFRAN ODT) 4 MG disintegrating tablet Take 1 tablet (4 mg total) by mouth every 8 (eight) hours as needed for nausea or vomiting. 20 tablet 0  . rizatriptan (MAXALT) 10 MG tablet Take 10 mg by mouth as needed for migraine.     . venlafaxine XR (EFFEXOR-XR) 37.5 MG  24 hr capsule Take 1 capsule (37.5 mg total) by mouth daily with breakfast. 90 capsule 1  . ergocalciferol (DRISDOL) 50000 units capsule Take 1 capsule (50,000 Units total) by mouth once a week. (Patient not taking: Reported on 08/19/2016) 4 capsule 2  . ketorolac (TORADOL) 10 MG tablet      No current facility-administered medications for this visit.     Review of Systems:  GENERAL:  Feels good.  Active.  No fevers.  Sweats sometimes.  Weight gain in the past year. PERFORMANCE STATUS (ECOG):  0 HEENT:  Sore throat today.  No visual changes, runny nose, sore throat, mouth sores or tenderness. Lungs: No shortness of breath.  Cough x 1 week (cold).  No hemoptysis. Cardiac:  No chest pain, palpitations, orthopnea, or PND. GI:  Occasional LLQ pain.  Stools "off x 2 months".  Constipation then takes stool softener.  No nausea, vomiting, diarrhea, constipation, melena or hematochezia.  Colonoscopy 2015 (every 5 years) GU:  No urgency, frequency, dysuria, or hematuria.  Musculoskeletal:  No back pain.  No joint pain.  No muscle tenderness. Extremities:  No pain or swelling. Skin:  Precancerous lesion on chest.  No rashes or skin changes. Neuro:  Migraine headaches.  Nonumbness or weakness, balance or coordination issues. Endocrine:  No diabetes, thyroid issues, hot flashes or night sweats.  Perimenopausal. Psych:  No mood changes, depression or anxiety.   Pain:  No focal pain. Review of systems:  All other systems reviewed and found to be negative.  Physical Exam: Blood pressure 124/82, pulse 89, temperature 98.6 F (37 C), temperature source Tympanic, resp. rate 18, height 5\' 4"  (1.626 m), weight 163 lb (73.9 kg). GENERAL:  Well developed, well nourished, woman sitting comfortably in the exam room in no acute distress. MENTAL STATUS:  Alert and oriented to person, place and time. HEAD:  Shoulder length blonde hair.  Normocephalic, atraumatic, face symmetric, no Cushingoid features. EYES:   Brown eyes.  Pupils equal round and reactive to light and accomodation.  No conjunctivitis or scleral icterus. ENT:  Oropharynx clear without lesion.  Tongue normal. Mucous membranes moist.  RESPIRATORY:  Clear to auscultation without rales, wheezes or rhonchi. CARDIOVASCULAR:  Regular rate and rhythm without murmur, rub or gallop. ABDOMEN:  Soft, non-tender, with active bowel sounds, and no hepatosplenomegaly.  No masses. SKIN:  Tan.  No rashes, ulcers or lesions. EXTREMITIES: No edema, no skin discoloration or tenderness.  No palpable cords. LYMPH NODES: No palpable cervical, supraclavicular, axillary or inguinal adenopathy  NEUROLOGICAL: Unremarkable. PSYCH:  Appropriate.   No visits with results within 3 Day(s) from this visit.  Latest known visit with results is:  Office Visit on 06/29/2016  Component Date Value Ref Range Status  . TSH 06/29/2016 0.56  0.35 -  4.50 uIU/mL Final  . Sodium 06/29/2016 138  135 - 145 mEq/L Final  . Potassium 06/29/2016 4.3  3.5 - 5.1 mEq/L Final  . Chloride 06/29/2016 104  96 - 112 mEq/L Final  . CO2 06/29/2016 27  19 - 32 mEq/L Final  . Glucose, Bld 06/29/2016 102* 70 - 99 mg/dL Final  . BUN 06/29/2016 17  6 - 23 mg/dL Final  . Creatinine, Ser 06/29/2016 0.82  0.40 - 1.20 mg/dL Final  . Total Bilirubin 06/29/2016 0.3  0.2 - 1.2 mg/dL Final  . Alkaline Phosphatase 06/29/2016 74  39 - 117 U/L Final  . AST 06/29/2016 14  0 - 37 U/L Final  . ALT 06/29/2016 15  0 - 35 U/L Final  . Total Protein 06/29/2016 6.8  6.0 - 8.3 g/dL Final  . Albumin 06/29/2016 4.3  3.5 - 5.2 g/dL Final  . Calcium 06/29/2016 9.7  8.4 - 10.5 mg/dL Final  . GFR 06/29/2016 77.93  >60.00 mL/min Final  . Cholesterol 06/29/2016 196  0 - 200 mg/dL Final   ATP III Classification       Desirable:  < 200 mg/dL               Borderline High:  200 - 239 mg/dL          High:  > = 240 mg/dL  . Triglycerides 06/29/2016 165.0* 0.0 - 149.0 mg/dL Final   Normal:  <150 mg/dLBorderline High:  150  - 199 mg/dL  . HDL 06/29/2016 48.70  >39.00 mg/dL Final  . VLDL 06/29/2016 33.0  0.0 - 40.0 mg/dL Final  . LDL Cholesterol 06/29/2016 115* 0 - 99 mg/dL Final  . Total CHOL/HDL Ratio 06/29/2016 4   Final                  Men          Women1/2 Average Risk     3.4          3.3Average Risk          5.0          4.42X Average Risk          9.6          7.13X Average Risk          15.0          11.0                      . NonHDL 06/29/2016 147.63   Final   NOTE:  Non-HDL goal should be 30 mg/dL higher than patient's LDL goal (i.e. LDL goal of < 70 mg/dL, would have non-HDL goal of < 100 mg/dL)  . Vitamin B-12 06/29/2016 591  211 - 911 pg/mL Final  . WBC 06/29/2016 12.1* 4.0 - 10.5 K/uL Final  . RBC 06/29/2016 4.36  3.87 - 5.11 Mil/uL Final  . Hemoglobin 06/29/2016 12.6  12.0 - 15.0 g/dL Final  . HCT 06/29/2016 38.1  36.0 - 46.0 % Final  . MCV 06/29/2016 87.3  78.0 - 100.0 fl Final  . MCHC 06/29/2016 33.2  30.0 - 36.0 g/dL Final  . RDW 06/29/2016 13.8  11.5 - 15.5 % Final  . Platelets 06/29/2016 242.0  150.0 - 400.0 K/uL Final  . Neutrophils Relative % 06/29/2016 59.8  43.0 - 77.0 % Final  . Lymphocytes Relative 06/29/2016 28.6  12.0 - 46.0 % Final  . Monocytes Relative 06/29/2016 8.0  3.0 - 12.0 %  Final  . Eosinophils Relative 06/29/2016 2.4  0.0 - 5.0 % Final  . Basophils Relative 06/29/2016 1.2  0.0 - 3.0 % Final  . Neutro Abs 06/29/2016 7.2  1.4 - 7.7 K/uL Final  . Lymphs Abs 06/29/2016 3.4  0.7 - 4.0 K/uL Final  . Monocytes Absolute 06/29/2016 1.0  0.1 - 1.0 K/uL Final  . Eosinophils Absolute 06/29/2016 0.3  0.0 - 0.7 K/uL Final  . Basophils Absolute 06/29/2016 0.1  0.0 - 0.1 K/uL Final  . VITD 06/29/2016 18.22* 30.00 - 100.00 ng/mL Final    Assessment:  Tiffany Acevedo is a 51 y.o. female with probable mild reactive leukocytosis.  She notes prednisone pulses 2 times a year.  She has had UTIs about 2-4 times a year.  She denies any B symptoms.  She does not smoke.  She has a family  history of malignancy (mother- colon cancer at age 87; father- prostate cancer; maternal aunt- leukemia).  She has colonoscopies every 5 years.  Last colonoscopy on 03/22/2013 revealed a polyp in the descending colon.    Symptomatically, she has had a URI x 1 week.  She notes intermittent LLQ pain.  Exam reveals no adenopathy or hepatosplenomegaly.  Plan: 1.  Discuss mild leukocytosis, likely reactive in nature.  Discuss demargination of WBCs from vascular endothelial lining with steroids.  Discuss slightly elevated WBC with infections.  Discuss normal differential.  Discuss screening labs.  Patient reassured. 2.  Labs today:  CBC with diff, CRP, sed rate. 3.  Peripheral smear for pathologic review. 4.  Call patient with results.  Addendum:  Peripheral smear reveals mild absolute neutrophilia with normal overall white blood cell count.  WBC, RBC and platelet morphology is normal.   Lequita Asal, MD  08/19/2016, 4:06 PM

## 2016-08-19 NOTE — Progress Notes (Signed)
Patient here today as new evaluation regarding leukocytosis. Referred by Dr. Derrel Nip.

## 2016-08-20 LAB — PATHOLOGIST SMEAR REVIEW

## 2016-09-06 ENCOUNTER — Ambulatory Visit: Payer: BC Managed Care – PPO | Admitting: Internal Medicine

## 2016-09-06 DIAGNOSIS — Z0289 Encounter for other administrative examinations: Secondary | ICD-10-CM

## 2016-09-07 ENCOUNTER — Encounter: Payer: Self-pay | Admitting: Family

## 2016-09-07 ENCOUNTER — Ambulatory Visit (INDEPENDENT_AMBULATORY_CARE_PROVIDER_SITE_OTHER): Payer: BC Managed Care – PPO | Admitting: Family

## 2016-09-07 VITALS — BP 122/78 | HR 73 | Temp 99.3°F | Ht 64.0 in | Wt 162.2 lb

## 2016-09-07 DIAGNOSIS — J029 Acute pharyngitis, unspecified: Secondary | ICD-10-CM

## 2016-09-07 LAB — POCT RAPID STREP A

## 2016-09-07 MED ORDER — MAGIC MOUTHWASH: 5.0000 mL | Freq: Three times a day (TID) | ORAL | 0 refills | Status: DC

## 2016-09-07 NOTE — Patient Instructions (Signed)
Will treat empirically for yeast infection  Also try zantac 150mg  twice per day, an hour before a meal.   Let me know if sore throat not better

## 2016-09-07 NOTE — Progress Notes (Signed)
Subjective:    Patient ID: Tiffany Acevedo, female    DOB: [birth year], 51 y.o.   MRN: [id number]  CC: Tiffany Acevedo is a 51 y.o. female who presents today for an acute visit.    HPI: CC: sore throat x 2 weeks , waxing and waning  Worse later in the day  Dry cough , comes and goes.   NO fever, sinus congestion, ear pain, wheezing, sob, ha.    Has epigastric burning occasionally.    Non smoker  NO one sick in household.   5 year son.       HISTORY:  Past Medical History:  Diagnosis Date  . Allergy   . Anxiety   . Arthritis   . Depression   . Frequent headaches   . Hypertension   . Migraines   . Renal calculi    Past Surgical History:  Procedure Laterality Date  . [place], 2004, 2013  . COLONOSCOPY  2015  . FRACTURE SURGERY     right ankle  . INCONTINENCE SURGERY    . PILONIDAL CYST EXCISION    . TONSILLECTOMY AND ADENOIDECTOMY     Family History  Problem Relation Age of Onset  . Hyperlipidemia Mother   . Hypertension Mother   . Depression Mother   . Cancer Mother   . Alcohol abuse Father   . Hyperlipidemia Father   . Stroke Father   . Hypertension Father   . Depression Father   . Cancer Father   . Arthritis Maternal Grandmother   . Hypertension Maternal Grandmother   . Depression Maternal Grandmother   . Arthritis Maternal Grandfather   . Stroke Maternal Grandfather   . Hypertension Maternal Grandfather   . Depression Maternal Grandfather   . Breast cancer Paternal Grandmother   . Hypertension Paternal Grandmother   . Depression Paternal Grandmother   . Hypertension Paternal Grandfather   . Depression Paternal Grandfather     Allergies: Percocet [oxycodone-acetaminophen]; Sulfa antibiotics; and Sulfonamide derivatives Current Outpatient Prescriptions on File Prior to Visit  Medication Sig Dispense Refill  . AZO-CRANBERRY PO Take 1 tablet by mouth daily.    . ergocalciferol (DRISDOL) 50000 units capsule Take 1 capsule  (50,000 Units total) by mouth once a week. 4 capsule 2  . ibuprofen (ADVIL,MOTRIN) 800 MG tablet Take 800 mg by mouth as needed.    Marland Kitchen imipramine (TOFRANIL) 50 MG tablet Take 50 mg by mouth at bedtime.     Marland Kitchen ketorolac (TORADOL) 10 MG tablet     . metoprolol succinate (TOPROL-XL) 100 MG 24 hr tablet Take by mouth.    . ondansetron (ZOFRAN ODT) 4 MG disintegrating tablet Take 1 tablet (4 mg total) by mouth every 8 (eight) hours as needed for nausea or vomiting. 20 tablet 0  . rizatriptan (MAXALT) 10 MG tablet Take 10 mg by mouth as needed for migraine.     . venlafaxine XR (EFFEXOR-XR) 37.5 MG 24 hr capsule Take 1 capsule (37.5 mg total) by mouth daily with breakfast. 90 capsule 1   No current facility-administered medications on file prior to visit.     Social History  Substance Use Topics  . Smoking status: Never Smoker  . Smokeless tobacco: Never Used  . Alcohol use 0.0 oz/week     Comment: glass of wine occassionally    Review of Systems  Constitutional: Negative for chills and fever.  HENT: Positive for sore throat. Negative for congestion, ear pain, trouble swallowing and voice change.  Eyes: Negative for visual disturbance.  Respiratory: Negative for cough and shortness of breath.   Cardiovascular: Negative for chest pain and palpitations.  Gastrointestinal: Negative for nausea and vomiting.  Neurological: Negative for headaches.      Objective:    BP 122/78   Pulse 73   Temp 99.3 F (37.4 C) (Oral)   Ht 5\' 4"  (1.626 m)   Wt 162 lb 3.2 oz (73.6 kg)   SpO2 96%   BMI 27.84 kg/m    Physical Exam  Constitutional: She appears well-developed and well-nourished.  HENT:  Head: Normocephalic and atraumatic.  Right Ear: Hearing, tympanic membrane, external ear and ear canal normal. No drainage, swelling or tenderness. No foreign bodies. Tympanic membrane is not erythematous and not bulging. No middle ear effusion. No decreased hearing is noted.  Left Ear: Hearing, tympanic  membrane, external ear and ear canal normal. No drainage, swelling or tenderness. No foreign bodies. Tympanic membrane is not erythematous and not bulging.  No middle ear effusion. No decreased hearing is noted.  Nose: Nose normal. No rhinorrhea. Right sinus exhibits no maxillary sinus tenderness and no frontal sinus tenderness. Left sinus exhibits no maxillary sinus tenderness and no frontal sinus tenderness.  Mouth/Throat: Uvula is midline and mucous membranes are normal. Posterior oropharyngeal erythema present. No oropharyngeal exudate, posterior oropharyngeal edema or tonsillar abscesses.  Eyes: Conjunctivae are normal.  Cardiovascular: Regular rhythm, normal heart sounds and normal pulses.   Pulmonary/Chest: Effort normal and breath sounds normal. She has no wheezes. She has no rhonchi. She has no rales.  Lymphadenopathy:       Head (right side): No submental, no submandibular, no tonsillar, no preauricular, no posterior auricular and no occipital adenopathy present.       Head (left side): No submental, no submandibular, no tonsillar, no preauricular, no posterior auricular and no occipital adenopathy present.    She has no cervical adenopathy.  Neurological: She is alert.  Skin: Skin is warm and dry.  Psychiatric: She has a normal mood and affect. Her speech is normal and behavior is normal. Thought content normal.  Vitals reviewed.      Assessment & Plan:    1. Sore throat Working diagnoses include GERD, oral candidiasis. Rapid strep negative We'll treat empirically with Magic mouthwash and advised patient to start trial of Zantac over-the-counter as well. Return precautions given.  - Upper Respiratory Culture - POCT Rapid Strep A - magic mouthwash SOLN; Take 5 mLs by mouth 3 (three) times daily.  Dispense: 105 mL; Refill: 0   I am having Ms. Tiffany Acevedo start on magic mouthwash. I am also having her maintain her imipramine, rizatriptan, ibuprofen, ondansetron, ketorolac, metoprolol  succinate, venlafaxine XR, ergocalciferol, and AZO-CRANBERRY PO.   Meds ordered this encounter  Medications  . magic mouthwash SOLN    Sig: Take 5 mLs by mouth 3 (three) times daily.    Dispense:  105 mL    Refill:  0    Order Specific Question:   Supervising Provider    Answer:   Crecencio Mc [[id number]]    Return precautions given.   Risks, benefits, and alternatives of the medications and treatment plan prescribed today were discussed, and patient expressed understanding.   Education regarding symptom management and diagnosis given to patient on AVS.  Continue to follow with Crecencio Mc, MD for routine health maintenance.   Lafe Garin and I agreed with plan.   Mable Paris, FNP

## 2016-09-10 LAB — CULTURE, UPPER RESPIRATORY: Organism ID, Bacteria: NORMAL

## 2016-09-24 ENCOUNTER — Other Ambulatory Visit: Payer: Self-pay | Admitting: Obstetrics and Gynecology

## 2016-09-24 DIAGNOSIS — Z1231 Encounter for screening mammogram for malignant neoplasm of breast: Secondary | ICD-10-CM

## 2016-10-18 ENCOUNTER — Other Ambulatory Visit: Payer: Self-pay | Admitting: Internal Medicine

## 2016-10-19 ENCOUNTER — Ambulatory Visit (INDEPENDENT_AMBULATORY_CARE_PROVIDER_SITE_OTHER): Payer: BC Managed Care – PPO | Admitting: Internal Medicine

## 2016-10-19 ENCOUNTER — Encounter: Payer: Self-pay | Admitting: Internal Medicine

## 2016-10-19 DIAGNOSIS — R635 Abnormal weight gain: Secondary | ICD-10-CM

## 2016-10-19 DIAGNOSIS — F411 Generalized anxiety disorder: Secondary | ICD-10-CM | POA: Diagnosis not present

## 2016-10-19 DIAGNOSIS — G43909 Migraine, unspecified, not intractable, without status migrainosus: Secondary | ICD-10-CM | POA: Diagnosis not present

## 2016-10-19 DIAGNOSIS — D72829 Elevated white blood cell count, unspecified: Secondary | ICD-10-CM | POA: Diagnosis not present

## 2016-10-19 DIAGNOSIS — Z23 Encounter for immunization: Secondary | ICD-10-CM | POA: Diagnosis not present

## 2016-10-19 NOTE — Patient Instructions (Signed)
You can increase the effexor to 3 tablets daily either in the morning or after dinner  If not tolerating,  Reduce  Dose back to 75 mg (2 tablet and I will refill at 75 mg dose)   We can then consider adding wellbutrin for concentration and energy    Try the Ciroc vokdka made form snap frost grapes ; it may not AGGRAVATE your headaches.   You might want to try a premixed protein drink called Premier Protein shake in the morning.  It is less $$$ and very low sugar.    160 cal  30 g protein  1 g sugar 50% calcium needs

## 2016-10-19 NOTE — Progress Notes (Signed)
Subjective:  Patient ID: Tiffany Acevedo, female    DOB: 19-Feb-1965  Age: 51 y.o. MRN: [id number]  CC: Diagnoses of Need for immunization against influenza, Leukocytosis, unspecified type, Generalized anxiety disorder, Migraine syndrome, and Excessive weight gain were pertinent to this visit.  HPI Tiffany Acevedo presents for follow up on chronic complaints of fatigue, decreased concentration,  Weight gain, migraines.     Last seen  in May.  Trial of effexor tolerated n the am.  Has has improvement in anxiety and night sweats.  Taking 75 mg daily for the last month or so. No period in 2 months.   Migraines: seeing shah,  Started on almovig; has had headache for several days   Workup for leukocytosis  By corcoran,  nomal variant   Sore throat x 2 weeks July 31 saw Joycelyn Schmid  Had a negative strep,  Culture,   Given magic mouthwash    Outpatient Medications Prior to Visit  Medication Sig Dispense Refill  . AZO-CRANBERRY PO Take 1 tablet by mouth daily.    Marland Kitchen ibuprofen (ADVIL,MOTRIN) 800 MG tablet Take 800 mg by mouth as needed.    Marland Kitchen imipramine (TOFRANIL) 50 MG tablet Take 50 mg by mouth at bedtime.     Marland Kitchen ketorolac (TORADOL) 10 MG tablet     . metoprolol succinate (TOPROL-XL) 100 MG 24 hr tablet Take by mouth.    . ondansetron (ZOFRAN ODT) 4 MG disintegrating tablet Take 1 tablet (4 mg total) by mouth every 8 (eight) hours as needed for nausea or vomiting. 20 tablet 0  . rizatriptan (MAXALT) 10 MG tablet Take 10 mg by mouth as needed for migraine.     . venlafaxine XR (EFFEXOR-XR) 37.5 MG 24 hr capsule TAKE 1 CAPSULE BY MOUTH EVERY DAY WITH BREAKFAST 90 capsule 1  . ergocalciferol (DRISDOL) 50000 units capsule Take 1 capsule (50,000 Units total) by mouth once a week. (Patient not taking: Reported on 10/19/2016) 4 capsule 2  . magic mouthwash SOLN Take 5 mLs by mouth 3 (three) times daily. (Patient not taking: Reported on 10/19/2016) 105 mL 0   No facility-administered medications prior to  visit.     Review of Systems;  Patient denies headache, fevers, malaise, unintentional weight loss, skin rash, eye pain, sinus congestion and sinus pain, sore throat, dysphagia,  hemoptysis , cough, dyspnea, wheezing, chest pain, palpitations, orthopnea, edema, abdominal pain, nausea, melena, diarrhea, constipation, flank pain, dysuria, hematuria, urinary  Frequency, nocturia, numbness, tingling, seizures,  Focal weakness, Loss of consciousness,  Tremor, insomnia, depression, anxiety, and suicidal ideation.      Objective:  BP 130/82 (BP Location: Left Arm, Patient Position: Sitting, Cuff Size: Normal)   Pulse 89   Temp 98.6 F (37 C) (Oral)   Resp 15   Ht 5\' 4"  (1.626 m)   Wt 165 lb 3.2 oz (74.9 kg)   SpO2 99%   BMI 28.36 kg/m   BP Readings from Last 3 Encounters:  10/19/16 130/82  09/07/16 122/78  08/19/16 124/82    Wt Readings from Last 3 Encounters:  10/19/16 165 lb 3.2 oz (74.9 kg)  09/07/16 162 lb 3.2 oz (73.6 kg)  08/19/16 163 lb (73.9 kg)    General appearance: alert, cooperative and appears stated age Ears: normal TM's and external ear canals both ears Throat: lips, mucosa, and tongue normal; teeth and gums normal Neck: no adenopathy, no carotid bruit, supple, symmetrical, trachea midline and thyroid not enlarged, symmetric, no tenderness/mass/nodules Back: symmetric, no curvature. ROM  normal. No CVA tenderness. Lungs: clear to auscultation bilaterally Heart: regular rate and rhythm, S1, S2 normal, no murmur, click, rub or gallop Abdomen: soft, non-tender; bowel sounds normal; no masses,  no organomegaly Pulses: 2+ and symmetric Skin: Skin color, texture, turgor normal. No rashes or lesions Lymph nodes: Cervical, supraclavicular, and axillary nodes normal.  No results found for: HGBA1C  Lab Results  Component Value Date   CREATININE 0.82 06/29/2016   CREATININE 0.97 04/07/2015   CREATININE 0.88 06/22/2013    Lab Results  Component Value Date   WBC 10.6  08/19/2016   HGB 12.8 08/19/2016   HCT 37.4 08/19/2016   PLT 231 08/19/2016   GLUCOSE 102 (H) 06/29/2016   CHOL 196 06/29/2016   TRIG 165.0 (H) 06/29/2016   HDL 48.70 06/29/2016   LDLCALC 115 (H) 06/29/2016   ALT 15 06/29/2016   AST 14 06/29/2016   NA 138 06/29/2016   K 4.3 06/29/2016   CL 104 06/29/2016   CREATININE 0.82 06/29/2016   BUN 17 06/29/2016   CO2 27 06/29/2016   TSH 0.56 06/29/2016   MICROALBUR 2.8 (H) 03/12/2010    Assessment & Plan:   Problem List Items Addressed This Visit    Excessive weight gain    I have addressed  BMI and recommended wt loss of 10% of body weigh over the next 6 months using a low glycemic index diet and regular exercise a minimum of 5 days per week.        Generalized anxiety disorder    Improved with effexor, also helping the hot flashes.       Leukocytosis    Resolved, by hematology evaluation       Migraine syndrome    No improvement yet with new medication started by Dr Manuella Ghazi       Relevant Medications   Erenumab-aooe (AIMOVIG 140 DOSE) 34 MG/ML SOAJ    Other Visit Diagnoses    Need for immunization against influenza       Relevant Orders   Flu Vaccine QUAD 36+ mos IM (Completed)      I have discontinued Ms. Cardinal's ergocalciferol and magic mouthwash. I am also having her maintain her imipramine, rizatriptan, ibuprofen, ondansetron, ketorolac, metoprolol succinate, AZO-CRANBERRY PO, venlafaxine XR, and Erenumab-aooe.  Meds ordered this encounter  Medications  . Erenumab-aooe (AIMOVIG 140 DOSE) 70 MG/ML SOAJ    Sig: Inject into the skin every 30 (thirty) days.    Medications Discontinued During This Encounter  Medication Reason  . ergocalciferol (DRISDOL) 50000 units capsule Completed Course  . magic mouthwash SOLN Patient has not taken in last 30 days    Follow-up: Return in about 3 months (around 01/18/2017).   Crecencio Mc, MD

## 2016-10-21 NOTE — Assessment & Plan Note (Signed)
No improvement yet with new medication started by Dr Manuella Ghazi

## 2016-10-21 NOTE — Assessment & Plan Note (Signed)
I have addressed  BMI and recommended wt loss of 10% of body weigh over the next 6 months using a low glycemic index diet and regular exercise a minimum of 5 days per week.   

## 2016-10-21 NOTE — Assessment & Plan Note (Signed)
Improved with effexor, also helping the hot flashes.

## 2016-10-21 NOTE — Assessment & Plan Note (Signed)
Resolved, by hematology evaluation

## 2016-11-04 ENCOUNTER — Encounter: Payer: Self-pay | Admitting: Internal Medicine

## 2016-11-04 ENCOUNTER — Telehealth: Payer: Self-pay | Admitting: Internal Medicine

## 2016-11-04 NOTE — Telephone Encounter (Signed)
Spoke with pt and informed her that Dr. Derrel Nip is not in the office right now so she would not be able to come by and drop off a urine. I advised the pt that she would need to go to urgent care if she is not wanting to wait through the night. The pt gave a verbal understanding and stated that she would go to urgent care.

## 2016-11-04 NOTE — Telephone Encounter (Signed)
Pt called about possibly having a UTI, bladder infection or kidney stone. Pt does not want to go all night in pain. Pt wants to know if she can just pee in a cup? Please advise?  Call pt @ [phone]. Thank you!

## 2016-11-17 ENCOUNTER — Other Ambulatory Visit: Payer: Self-pay | Admitting: Internal Medicine

## 2016-11-18 NOTE — Telephone Encounter (Signed)
At patient last OV it is documented that she take 75mg  daily and that was working.  I spoke with the patient and she thought her pills were 25mg  caps and she remembers you talking to her about possibly increasing to 3 or 4 pills, so after her visit she increase to three, but again thought they were 25mg  tablets, so has been taking 112.5mg . She asked for a refill which was for the 37.5, so one is is okay taking 112.5mg  and if so she needs a rx to take the higher dose,. thanks

## 2016-11-18 NOTE — Telephone Encounter (Signed)
Pt called requesting clarification on this medication as she thought that Dr. Derrel Nip increased on her last visit. Please advise, thank you!  Call pt @ [phone]

## 2017-01-18 ENCOUNTER — Ambulatory Visit: Payer: BC Managed Care – PPO | Admitting: Internal Medicine

## 2017-01-20 ENCOUNTER — Ambulatory Visit: Payer: BC Managed Care – PPO | Admitting: Internal Medicine

## 2017-01-20 ENCOUNTER — Encounter: Payer: Self-pay | Admitting: Internal Medicine

## 2017-01-20 VITALS — BP 122/78 | HR 81 | Temp 98.5°F | Resp 15 | Ht 64.0 in | Wt 165.4 lb

## 2017-01-20 DIAGNOSIS — E663 Overweight: Secondary | ICD-10-CM | POA: Diagnosis not present

## 2017-01-20 DIAGNOSIS — F411 Generalized anxiety disorder: Secondary | ICD-10-CM

## 2017-01-20 DIAGNOSIS — Z1231 Encounter for screening mammogram for malignant neoplasm of breast: Secondary | ICD-10-CM | POA: Diagnosis not present

## 2017-01-20 DIAGNOSIS — H8143 Vertigo of central origin, bilateral: Secondary | ICD-10-CM

## 2017-01-20 DIAGNOSIS — IMO0001 Reserved for inherently not codable concepts without codable children: Secondary | ICD-10-CM

## 2017-01-20 DIAGNOSIS — R42 Dizziness and giddiness: Secondary | ICD-10-CM

## 2017-01-20 DIAGNOSIS — Z1239 Encounter for other screening for malignant neoplasm of breast: Secondary | ICD-10-CM

## 2017-01-20 MED ORDER — HYDROCHLOROTHIAZIDE 12.5 MG PO CAPS: 12.5000 mg | ORAL_CAPSULE | Freq: Every day | ORAL | 2 refills | Status: AC

## 2017-01-20 MED ORDER — VENLAFAXINE HCL ER 150 MG PO CP24: 150.0000 mg | ORAL_CAPSULE | Freq: Every day | ORAL | 5 refills | Status: DC

## 2017-01-20 NOTE — Progress Notes (Signed)
Subjective:  Patient ID: Tiffany Acevedo, female    DOB: 02/25/1965  Age: 51 y.o. MRN: [id number]  CC: The primary encounter diagnosis was Vertigo. 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  HPI NANCIE BOCANEGRA presents for follow up on GAD, and perimenopause managed with Effexor   Feels less anxious and irritable,  Having fewer hot flashes.   New onset vertigo on Nov 30 was referred to ENT by Neurology but has not been seen yet.  Occurs with position change and turing of head. Not accompanied by headache,  Vision changes or nausea.   LLQ pain resolved,  Ultrasound was normal except for cysts.  Periods irregular       Outpatient Medications Prior to Visit  Medication Sig Dispense Refill  . AZO-CRANBERRY PO Take 1 tablet by mouth daily.    Eduard Roux (AIMOVIG 140 DOSE) 70 MG/ML SOAJ Inject into the skin every 30 (thirty) days.    Marland Kitchen ibuprofen (ADVIL,MOTRIN) 800 MG tablet Take 800 mg by mouth as needed.    Marland Kitchen imipramine (TOFRANIL) 50 MG tablet Take 50 mg by mouth at bedtime.     . metoprolol succinate (TOPROL-XL) 100 MG 24 hr tablet Take by mouth.    . ondansetron (ZOFRAN ODT) 4 MG disintegrating tablet Take 1 tablet (4 mg total) by mouth every 8 (eight) hours as needed for nausea or vomiting. 20 tablet 0  . rizatriptan (MAXALT) 10 MG tablet Take 10 mg by mouth as needed for migraine.     . venlafaxine XR (EFFEXOR-XR) 75 MG 24 hr capsule Take 1 capsule (75 mg total) by mouth daily with breakfast. May take additional dose of 75mg  daily if needed. thanks 135 capsule 1  . ketorolac (TORADOL) 10 MG tablet     . venlafaxine XR (EFFEXOR-XR) 37.5 MG 24 hr capsule TAKE 1 CAPSULE BY MOUTH EVERY DAY WITH BREAKFAST (Patient not taking: Reported on 01/20/2017) 90 capsule 1   No facility-administered medications prior to visit.     Review of Systems;  Patient  denies headache, fevers, malaise, unintentional weight loss, skin rash, eye pain, sinus congestion and sinus pain, sore throat, dysphagia,  hemoptysis , cough, dyspnea, wheezing, chest pain, palpitations, orthopnea, edema, abdominal pain, nausea, melena, diarrhea, constipation, flank pain, dysuria, hematuria, urinary  Frequency, nocturia, numbness, tingling, seizures,  Focal weakness, Loss of consciousness,  Tremor, insomnia, depression, anxiety, and suicidal ideation.      Objective:  BP 122/78 (BP Location: Left Arm, Patient Position: Sitting, Cuff Size: Normal)   Pulse 81   Temp 98.5 F (36.9 C) (Oral)   Resp 15   Ht 5\' 4"  (1.626 m)   Wt 165 lb 6.4 oz (75 kg)   SpO2 99%   BMI 28.39 kg/m   BP Readings from Last 3 Encounters:  01/20/17 122/78  10/19/16 130/82  09/07/16 122/78    Wt Readings from Last 3 Encounters:  01/20/17 165 lb 6.4 oz (75 kg)  10/19/16 165 lb 3.2 oz (74.9 kg)  09/07/16 162 lb 3.2 oz (73.6 kg)    General appearance: alert, cooperative and appears stated age Ears: normal TM's and external ear canals both ears Throat: lips, mucosa, and tongue normal; teeth and gums normal Neck: no adenopathy, no carotid bruit, supple, symmetrical, trachea midline and thyroid not enlarged, symmetric, no tenderness/mass/nodules Back: symmetric, no curvature. ROM normal. No CVA tenderness. Lungs: clear to auscultation bilaterally  Heart: regular rate and rhythm, S1, S2 normal, no murmur, click, rub or gallop Abdomen: soft, non-tender; bowel sounds normal; no masses,  no organomegaly Pulses: 2+ and symmetric Skin: Skin color, texture, turgor normal. No rashes or lesions Lymph nodes: Cervical, supraclavicular, and axillary nodes normal.  No results found for: HGBA1C  Lab Results  Component Value Date   CREATININE 0.82 06/29/2016   CREATININE 0.97 04/07/2015   CREATININE 0.88 06/22/2013    Lab Results  Component Value Date   WBC 10.6 08/19/2016   HGB 12.8 08/19/2016    HCT 37.4 08/19/2016   PLT 231 08/19/2016   GLUCOSE 102 (H) 06/29/2016   CHOL 196 06/29/2016   TRIG 165.0 (H) 06/29/2016   HDL 48.70 06/29/2016   LDLCALC 115 (H) 06/29/2016   ALT 15 06/29/2016   AST 14 06/29/2016   NA 138 06/29/2016   K 4.3 06/29/2016   CL 104 06/29/2016   CREATININE 0.82 06/29/2016   BUN 17 06/29/2016   CO2 27 06/29/2016   TSH 0.56 06/29/2016   MICROALBUR 2.8 (H) 03/12/2010     Assessment & Plan:   Problem List Items Addressed This Visit    Generalized anxiety disorder    Improved with effexor, also helping her hot flashes.       Relevant Medications   venlafaxine XR (EFFEXOR-XR) 150 MG 24 hr capsule   Overweight (BMI 25.0-29.9)    I have addressed  BMI and recommended wt loss of 10% of body weigh over the next 6 months using a low glycemic index diet and regular exercise a minimum of 5 days per week.        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  Trial of hctz       Other Visit Diagnoses    Vertigo    -  Primary   Relevant Orders   Ambulatory referral to ENT   Comprehensive metabolic panel   Breast cancer screening       Relevant Orders   MM SCREENING BREAST TOMO BILATERAL      I have discontinued Vena P. Cenci's ketorolac and venlafaxine XR. I have also changed her venlafaxine XR. Additionally, I am having her start on hydrochlorothiazide. Lastly, I am having her maintain her imipramine, rizatriptan, ibuprofen, ondansetron, metoprolol succinate, AZO-CRANBERRY PO, and Erenumab-aooe.  Meds ordered this encounter  Medications  . venlafaxine XR (EFFEXOR-XR) 150 MG 24 hr capsule    Sig: Take 1 capsule (150 mg total) by mouth daily with breakfast.    Dispense:  30 capsule    Refill:  5  . hydrochlorothiazide (MICROZIDE) 12.5 MG capsule    Sig: Take 1 capsule (12.5 mg total) by mouth daily.    Dispense:  30 capsule    Refill:  2    Medications Discontinued During This Encounter  Medication Reason  .  ketorolac (TORADOL) 10 MG tablet Patient has not taken in last 30 days  . venlafaxine XR (EFFEXOR-XR) 37.5 MG 24 hr capsule Patient has not taken in last 30 days  . venlafaxine XR (EFFEXOR-XR) 75 MG 24 hr capsule     Follow-up: No Follow-up on file.   Crecencio Mc, MD

## 2017-01-20 NOTE — Patient Instructions (Signed)
I have changed your Effexor to a 150 mg once daily tablet  I am adding 12.5 mg hctz to see if it helps your vertigo  ENT referral has been placed as well  Mammogram reordered  If the hctz helps ,  Continue it and return for a blood test (no fasting required) after a few weeks

## 2017-01-22 ENCOUNTER — Encounter: Payer: Self-pay | Admitting: Internal Medicine

## 2017-01-22 DIAGNOSIS — E663 Overweight: Secondary | ICD-10-CM | POA: Insufficient documentation

## 2017-01-22 DIAGNOSIS — IMO0001 Reserved for inherently not codable concepts without codable children: Secondary | ICD-10-CM | POA: Insufficient documentation

## 2017-01-22 NOTE — Assessment & Plan Note (Signed)
Improved with effexor, also helping her hot flashes.

## 2017-01-22 NOTE — Assessment & Plan Note (Signed)
I have addressed  BMI and recommended wt loss of 10% of body weigh over the next 6 months using a low glycemic index diet and regular exercise a minimum of 5 days per week.   

## 2017-01-22 NOTE — Assessment & Plan Note (Signed)
Neurologic exam is normal.  Trial of hctz

## 2017-02-11 ENCOUNTER — Encounter: Payer: Self-pay | Admitting: Internal Medicine

## 2017-03-21 ENCOUNTER — Ambulatory Visit
Admission: RE | Admit: 2017-03-21 | Discharge: 2017-03-21 | Disposition: A | Discharge status: Home / Self Care | Payer: BC Managed Care – PPO | Source: Ambulatory Visit | Attending: Internal Medicine | Admitting: Internal Medicine

## 2017-03-21 ENCOUNTER — Ambulatory Visit: Payer: BC Managed Care – PPO

## 2017-03-21 DIAGNOSIS — Z1239 Encounter for other screening for malignant neoplasm of breast: Secondary | ICD-10-CM

## 2017-03-21 DIAGNOSIS — Z1231 Encounter for screening mammogram for malignant neoplasm of breast: Secondary | ICD-10-CM | POA: Insufficient documentation

## 2017-05-06 ENCOUNTER — Ambulatory Visit: Payer: BC Managed Care – PPO | Admitting: Family

## 2017-05-06 ENCOUNTER — Telehealth: Payer: Self-pay | Admitting: Family

## 2017-05-06 DIAGNOSIS — R05 Cough: Secondary | ICD-10-CM

## 2017-05-06 DIAGNOSIS — R059 Cough, unspecified: Secondary | ICD-10-CM

## 2017-05-06 MED ORDER — HYDROCODONE-HOMATROPINE 5-1.5 MG/5ML PO SYRP: 5.0000 mL | ORAL_SOLUTION | Freq: Every evening | ORAL | 0 refills | Status: DC | PRN

## 2017-05-06 MED ORDER — AZITHROMYCIN 250 MG PO TABS: ORAL_TABLET | ORAL | 0 refills | Status: DC

## 2017-05-06 NOTE — Telephone Encounter (Signed)
Unable to see patient in clinic due to gas leak- We spoke in parking lot. She is well appearing and no acute respiratory distress. Not labored in speech.   Here today complaining of a cough for 3 weeks , waxing and waning.  No h.o lung diease She reports non productive cough. No wheezing, cp, sob. Worse when she lays down.  Tried nyquil with no relief Works as Forensic scientist on duration of symptoms, we jointly agreed that starting an antibiotic is next step. Explained to take hycodan syrup at bedtime only. Start plain mucinex and probiotics.   She will let me know via mychart if not any better.

## 2017-06-10 ENCOUNTER — Encounter: Payer: Self-pay | Admitting: Family

## 2017-06-10 NOTE — Telephone Encounter (Signed)
Spoke with patient and states she is still coughing. Headache improved today . Appointment scheduled for Monday.

## 2017-06-13 ENCOUNTER — Ambulatory Visit: Payer: BC Managed Care – PPO | Admitting: Family

## 2017-06-13 VITALS — BP 124/82 | HR 95 | Temp 98.9°F | Wt 163.5 lb

## 2017-06-13 DIAGNOSIS — K1370 Unspecified lesions of oral mucosa: Secondary | ICD-10-CM | POA: Diagnosis not present

## 2017-06-13 DIAGNOSIS — R05 Cough: Secondary | ICD-10-CM

## 2017-06-13 DIAGNOSIS — R059 Cough, unspecified: Secondary | ICD-10-CM

## 2017-06-13 LAB — POCT RAPID STREP A (OFFICE): Rapid Strep A Screen: NEGATIVE

## 2017-06-13 MED ORDER — ALBUTEROL SULFATE (2.5 MG/3ML) 0.083% IN NEBU
2.5000 mg | INHALATION_SOLUTION | Freq: Once | RESPIRATORY_TRACT | Status: AC
  Administered 2017-06-13: 2.5 mg via RESPIRATORY_TRACT

## 2017-06-13 MED ORDER — HYDROCODONE-HOMATROPINE 5-1.5 MG/5ML PO SYRP: 5.0000 mL | ORAL_SOLUTION | Freq: Every evening | ORAL | 0 refills | Status: DC | PRN

## 2017-06-13 MED ORDER — DOXYCYCLINE HYCLATE 100 MG PO TABS: 100.0000 mg | ORAL_TABLET | Freq: Two times a day (BID) | ORAL | 0 refills | Status: DC

## 2017-06-13 NOTE — Progress Notes (Signed)
Subjective:    Patient ID: Tiffany Acevedo, female    DOB: Oct 14, 1965, 52 y.o.   MRN: [id number]  CC: Tiffany Acevedo is a 52 y.o. female who presents today for an acute visit.    HPI: CC: dry cough, waxing and waning,  10 days.  Endorses thin drainage, which noticed 5 days ago.  Cough worse at night. NO sinus pressure, ear pain, sore throat, sob, cp. Thinks may hear a wheeze every one in a while, clears with cough. No h/o cold sores.   improvement with zpak; then new cough started.   Complains of acid reflux. Takes tums with relief.   HA improved. H/o of migraines. 5 days ago had to take ibuprofen 800 and maxalt , then had to take again to finally resolve HA.Marland Kitchen Stress and lack of sleep will trigger.   Follows with Dr Manuella Ghazi for migraines.     3/29 Given azithromycin HISTORY:  Past Medical History:  Diagnosis Date  . Allergy   . Anxiety   . Arthritis   . Depression   . Excessive weight gain 06/30/2016  . Frequent headaches   . Hypertension   . Migraines   . Renal calculi    Past Surgical History:  Procedure Laterality Date  . [place], 2004, 2013  . COLONOSCOPY  2015  . FRACTURE SURGERY     right ankle  . INCONTINENCE SURGERY    . PILONIDAL CYST EXCISION    . TONSILLECTOMY AND ADENOIDECTOMY     Family History  Problem Relation Age of Onset  . Hyperlipidemia Mother   . Hypertension Mother   . Depression Mother   . Cancer Mother   . Alcohol abuse Father   . Hyperlipidemia Father   . Stroke Father   . Hypertension Father   . Depression Father   . Cancer Father   . Arthritis Maternal Grandmother   . Hypertension Maternal Grandmother   . Depression Maternal Grandmother   . Arthritis Maternal Grandfather   . Stroke Maternal Grandfather   . Hypertension Maternal Grandfather   . Depression Maternal Grandfather   . Breast cancer Paternal Grandmother   . Hypertension Paternal Grandmother   . Depression Paternal Grandmother   . Hypertension Paternal  Grandfather   . Depression Paternal Grandfather     Allergies: Percocet [oxycodone-acetaminophen]; Sulfa antibiotics; and Sulfonamide derivatives Current Outpatient Medications on File Prior to Visit  Medication Sig Dispense Refill  . AZO-CRANBERRY PO Take 1 tablet by mouth daily.    Eduard Roux (AIMOVIG 140 DOSE) 70 MG/ML SOAJ Inject into the skin every 30 (thirty) days.    . hydrochlorothiazide (MICROZIDE) 12.5 MG capsule Take 1 capsule (12.5 mg total) by mouth daily. 30 capsule 2  . ibuprofen (ADVIL,MOTRIN) 800 MG tablet Take 800 mg by mouth as needed.    Marland Kitchen imipramine (TOFRANIL) 50 MG tablet Take 50 mg by mouth at bedtime.     . metoprolol succinate (TOPROL-XL) 100 MG 24 hr tablet Take by mouth.    . ondansetron (ZOFRAN ODT) 4 MG disintegrating tablet Take 1 tablet (4 mg total) by mouth every 8 (eight) hours as needed for nausea or vomiting. 20 tablet 0  . rizatriptan (MAXALT) 10 MG tablet Take 10 mg by mouth as needed for migraine.     . venlafaxine XR (EFFEXOR-XR) 150 MG 24 hr capsule Take 1 capsule (150 mg total) by mouth daily with breakfast. 30 capsule 5   No current facility-administered medications on file prior to  visit.     Social History   Tobacco Use  . Smoking status: Never Smoker  . Smokeless tobacco: Never Used  Substance Use Topics  . Alcohol use: Yes    Alcohol/week: 0.0 oz    Comment: glass of wine occassionally  . Drug use: No    Review of Systems  Constitutional: Negative for chills and fever.  HENT: Positive for postnasal drip. Negative for congestion, sinus pressure and trouble swallowing.   Respiratory: Positive for cough. Negative for shortness of breath and wheezing.   Cardiovascular: Negative for chest pain and palpitations.  Gastrointestinal: Negative for nausea and vomiting.  Neurological: Positive for headaches (chronic, no HA today).      Objective:    BP 124/82 (BP Location: Left Arm, Patient Position: Sitting, Cuff Size: Normal)    Pulse 95   Temp 98.9 F (37.2 C) (Oral)   Wt 163 lb 8 oz (74.2 kg)   SpO2 97%   BMI 28.06 kg/m    Physical Exam  Constitutional: She appears well-developed and well-nourished.  HENT:  Head: Normocephalic and atraumatic.  Right Ear: Hearing, tympanic membrane, external ear and ear canal normal. No drainage, swelling or tenderness. No foreign bodies. Tympanic membrane is not erythematous and not bulging. No middle ear effusion. No decreased hearing is noted.  Left Ear: Hearing, tympanic membrane, external ear and ear canal normal. No drainage, swelling or tenderness. No foreign bodies. Tympanic membrane is not erythematous and not bulging.  No middle ear effusion. No decreased hearing is noted.  Nose: Nose normal. No rhinorrhea. Right sinus exhibits no maxillary sinus tenderness and no frontal sinus tenderness. Left sinus exhibits no maxillary sinus tenderness and no frontal sinus tenderness.  Mouth/Throat: Uvula is midline, oropharynx is clear and moist and mucous membranes are normal. Oral lesions present. No oropharyngeal exudate, posterior oropharyngeal edema, posterior oropharyngeal erythema or tonsillar abscesses.    Papular rash noted posterior pharynx. 2-3 White papules noted . Non tender to patient.  No tonsillar exudate. NO vesicles. Tonsils 1/4.   Eyes: Conjunctivae are normal.  Cardiovascular: Regular rhythm, normal heart sounds and normal pulses.  Pulmonary/Chest: Effort normal and breath sounds normal. She has no wheezes. She has no rhonchi. She has no rales.  Lymphadenopathy:       Head (right side): No submental, no submandibular, no tonsillar, no preauricular, no posterior auricular and no occipital adenopathy present.       Head (left side): No submental, no submandibular, no tonsillar, no preauricular, no posterior auricular and no occipital adenopathy present.    She has no cervical adenopathy.  Neurological: She is alert.  Skin: Skin is warm and dry.  Psychiatric:  She has a normal mood and affect. Her speech is normal and behavior is normal. Thought content normal.  Vitals reviewed.  Patient felt no difference after albuterol treatment. Lung sounds did increase.     Assessment & Plan:   .1. Oral lesion Etiology nonspecific at this time. No sore throat or trouble swallowing. NO h/o cold sores and presentation didn't appear vesicular. Patient will monitor the next couple of days. If no improvement and culture unrevealing, agreed on ENT consult.  - POCT rapid strep A - Culture, Group A Strep  2. Cough No acute respiratory distress. Cough in office didn't improve with albuterol, therefore didn't prescribe inhaler. Patient is well appearing. Based on duration of symptoms, will treat with doxycycline. Discussed risk of resistance, diarrheal infections on antibiotics which she understood. She will also trial zantac as  concern GERD may be contributory. Patient will let me know if not better.   - HYDROcodone-homatropine (HYCODAN) 5-1.5 MG/5ML syrup; Take 5 mLs by mouth at bedtime as needed for cough.  Dispense: 45 mL; Refill: 0 - doxycycline (VIBRA-TABS) 100 MG tablet; Take 1 tablet (100 mg total) by mouth 2 (two) times daily.  Dispense: 10 tablet; Refill: 0 - albuterol (PROVENTIL) (2.5 MG/3ML) 0.083% nebulizer solution 2.5 mg    I have discontinued Cherrelle P. Beebe's azithromycin and HYDROcodone-homatropine. I am also having her start on HYDROcodone-homatropine and doxycycline. Additionally, I am having her maintain her imipramine, rizatriptan, ibuprofen, ondansetron, metoprolol succinate, AZO-CRANBERRY PO, Erenumab-aooe, venlafaxine XR, and hydrochlorothiazide. We administered albuterol.   Meds ordered this encounter  Medications  . HYDROcodone-homatropine (HYCODAN) 5-1.5 MG/5ML syrup    Sig: Take 5 mLs by mouth at bedtime as needed for cough.    Dispense:  45 mL    Refill:  0    Order Specific Question:   Supervising Provider    Answer:   Derrel Nip,  TERESA L [[id number]]  . doxycycline (VIBRA-TABS) 100 MG tablet    Sig: Take 1 tablet (100 mg total) by mouth 2 (two) times daily.    Dispense:  10 tablet    Refill:  0    Order Specific Question:   Supervising Provider    Answer:   Deborra Medina L [[id number]]  . albuterol (PROVENTIL) (2.5 MG/3ML) 0.083% nebulizer solution 2.5 mg    Return precautions given.   Risks, benefits, and alternatives of the medications and treatment plan prescribed today were discussed, and patient expressed understanding.   Education regarding symptom management and diagnosis given to patient on AVS.  Continue to follow with Crecencio Mc, MD for routine health maintenance.   Lafe Garin and I agreed with plan.   Mable Paris, FNP

## 2017-06-13 NOTE — Patient Instructions (Addendum)
Trial of zantac 150mg  twice per day  ? Acid reflux causing cough  Start doxcycline. ENSURE Probiotics and stay out of sun as we discussed  Please take cough medication at night only as needed. As we discussed, I do not recommend dosing throughout the day as coughing is a protective mechanism . It also helps to break up thick mucous.  Do not take cough suppressants with alcohol as can lead to trouble breathing. Advise caution if taking cough suppressant and operating machinery ( i.e driving a car) as you may feel very tired.   Watch oral lesions- if no better, let me  Know as I would consult ENT.

## 2017-06-15 ENCOUNTER — Encounter: Payer: Self-pay | Admitting: Family

## 2017-06-15 LAB — CULTURE, GROUP A STREP
MICRO NUMBER:: 90549434
SPECIMEN QUALITY:: ADEQUATE

## 2017-06-17 ENCOUNTER — Encounter: Payer: Self-pay | Admitting: Family

## 2017-06-17 ENCOUNTER — Other Ambulatory Visit: Payer: Self-pay | Admitting: Family

## 2017-06-17 DIAGNOSIS — J029 Acute pharyngitis, unspecified: Secondary | ICD-10-CM

## 2017-06-17 MED ORDER — MAGIC MOUTHWASH W/LIDOCAINE: 5.0000 mL | Freq: Three times a day (TID) | ORAL | 1 refills | Status: AC | PRN

## 2017-06-17 NOTE — Progress Notes (Signed)
close

## 2017-06-28 ENCOUNTER — Encounter: Payer: Self-pay | Admitting: Family

## 2017-06-29 ENCOUNTER — Telehealth: Payer: Self-pay

## 2017-06-29 NOTE — Telephone Encounter (Signed)
Copied from [place] [id number]. Topic: Quick Communication - See Telephone Encounter >> Jun 29, 2017  9:11 AM Antonieta Iba C wrote: CRM for notification. See Telephone encounter for: 06/29/17.  Pt would like to be advised by Joycelyn Schmid, she sent a My-Chart message. Pt has a (referral) to ENT, her apt is today at 10:30 pt would like to know if Kathrin Penner could review message as soon as possible before her apt this morning so that pt will know what to do   Please assist further.

## 2017-07-01 NOTE — Telephone Encounter (Signed)
Tiffany Acevedo responded via mychart see message dated 06/29/17

## 2018-01-10 ENCOUNTER — Other Ambulatory Visit: Payer: Self-pay | Admitting: Physician Assistant

## 2018-01-10 DIAGNOSIS — Z1231 Encounter for screening mammogram for malignant neoplasm of breast: Secondary | ICD-10-CM

## 2018-01-14 ENCOUNTER — Other Ambulatory Visit: Payer: Self-pay | Admitting: Internal Medicine

## 2018-03-15 ENCOUNTER — Ambulatory Visit: Payer: Self-pay | Admitting: Urology

## 2018-04-03 ENCOUNTER — Ambulatory Visit
Admission: RE | Admit: 2018-04-03 | Discharge: 2018-04-03 | Disposition: A | Discharge status: Home / Self Care | Payer: BC Managed Care – PPO | Source: Ambulatory Visit | Attending: Physician Assistant | Admitting: Physician Assistant

## 2018-04-03 DIAGNOSIS — Z1231 Encounter for screening mammogram for malignant neoplasm of breast: Secondary | ICD-10-CM | POA: Insufficient documentation

## 2018-04-20 ENCOUNTER — Ambulatory Visit
Admission: RE | Admit: 2018-04-20 | Discharge: 2018-04-20 | Disposition: A | Discharge status: Home / Self Care | Payer: BC Managed Care – PPO | Source: Ambulatory Visit | Attending: Urology | Admitting: Urology

## 2018-04-20 ENCOUNTER — Encounter: Payer: Self-pay | Admitting: Urology

## 2018-04-20 ENCOUNTER — Other Ambulatory Visit: Payer: Self-pay

## 2018-04-20 ENCOUNTER — Ambulatory Visit: Payer: BC Managed Care – PPO | Admitting: Urology

## 2018-04-20 VITALS — BP 107/69 | HR 76 | Ht 64.0 in | Wt 165.0 lb

## 2018-04-20 DIAGNOSIS — R3129 Other microscopic hematuria: Secondary | ICD-10-CM | POA: Diagnosis not present

## 2018-04-20 DIAGNOSIS — Z87442 Personal history of urinary calculi: Secondary | ICD-10-CM | POA: Diagnosis present

## 2018-04-20 DIAGNOSIS — R102 Pelvic and perineal pain: Secondary | ICD-10-CM | POA: Diagnosis not present

## 2018-04-20 LAB — BLADDER SCAN AMB NON-IMAGING

## 2018-04-20 LAB — URINALYSIS, COMPLETE
Bilirubin, UA: NEGATIVE
Glucose, UA: NEGATIVE
Ketones, UA: NEGATIVE
Nitrite, UA: NEGATIVE
Specific Gravity, UA: 1.025 (ref 1.005–1.030)
Urobilinogen, Ur: 0.2 mg/dL (ref 0.2–1.0)
pH, UA: 6.5 (ref 5.0–7.5)

## 2018-04-20 LAB — MICROSCOPIC EXAMINATION

## 2018-04-20 NOTE — H&P (View-Only) (Signed)
04/20/2018  11:04 AM   Tiffany Acevedo 01-12-1966 [id number]  Referring provider: Crecencio Mc, MD [facility] [place], [place] [address]  Chief Complaint  Patient presents with  . Urinary Incontinence    New Patient    HPI: Tiffany Acevedo is a 53 yo F who presents today for the evaluation and management of urinary incontinence.  -Referred to [place] by Sheron Nightingale, PA  -Initially referred for stress incontinence after childbirth however she canceled the appointment due to a conflict. -Stress incontinence is mild and not bothersome. -Pelvic pressure with gross hematuria since 04/13/2018  -2 day history of dark urine -Prior history of kidney stones with multiple procedures done in the past  -Denies pain or burning with urination   PMH: Past Medical History:  Diagnosis Date  . Allergy   . Anxiety   . Arthritis   . Depression   . Excessive weight gain 06/30/2016  . Frequent headaches   . Hypertension   . Migraines   . Renal calculi     Surgical History: Past Surgical History:  Procedure Laterality Date  . [place], 2004, 2013  . COLONOSCOPY  2015  . FRACTURE SURGERY     right ankle  . INCONTINENCE SURGERY    . PILONIDAL CYST EXCISION    . TONSILLECTOMY AND ADENOIDECTOMY      Home Medications:  Allergies as of 04/20/2018      Reactions   Percocet [oxycodone-acetaminophen] Other (See Comments)   Sulfa Antibiotics Other (See Comments)   Sulfonamide Derivatives    REACTION: unspecified      Medication List       Accurate as of April 20, 2018 11:04 AM. Always use your most recent med list.        Aimovig (140 MG Dose) 70 MG/ML Soaj Generic drug:  Erenumab-aooe Inject into the skin every 30 (thirty) days.   AZO-CRANBERRY PO Take 1 tablet by mouth daily.   hydrochlorothiazide 12.5 MG capsule Commonly known as:  Microzide Take 1 capsule (12.5 mg total) by mouth daily.   ibuprofen 800 MG tablet Commonly known as:   ADVIL,MOTRIN Take 800 mg by mouth as needed.   imipramine 50 MG tablet Commonly known as:  TOFRANIL Take 50 mg by mouth at bedtime.   metoprolol succinate 100 MG 24 hr tablet Commonly known as:  TOPROL-XL Take by mouth.   ondansetron 4 MG disintegrating tablet Commonly known as:  Zofran ODT Take 1 tablet (4 mg total) by mouth every 8 (eight) hours as needed for nausea or vomiting.   rizatriptan 10 MG tablet Commonly known as:  MAXALT Take 10 mg by mouth as needed for migraine.   venlafaxine XR 150 MG 24 hr capsule Commonly known as:  EFFEXOR-XR TAKE 1 CAPSULE BY MOUTH EVERY DAY WITH BREAKFAST       Allergies:  Allergies  Allergen Reactions  . Percocet [Oxycodone-Acetaminophen] Other (See Comments)  . Sulfa Antibiotics Other (See Comments)  . Sulfonamide Derivatives     REACTION: unspecified    Family History: Family History  Problem Relation Age of Onset  . Hyperlipidemia Mother   . Hypertension Mother   . Depression Mother   . Cancer Mother   . Alcohol abuse Father   . Hyperlipidemia Father   . Stroke Father   . Hypertension Father   . Depression Father   . Cancer Father   . Arthritis Maternal Grandmother   . Hypertension Maternal Grandmother   .  Depression Maternal Grandmother   . Arthritis Maternal Grandfather   . Stroke Maternal Grandfather   . Hypertension Maternal Grandfather   . Depression Maternal Grandfather   . Hypertension Paternal Grandmother   . Depression Paternal Grandmother   . Hypertension Paternal Grandfather   . Depression Paternal Grandfather     Social History:  reports that she has never smoked. She has never used smokeless tobacco. She reports current alcohol use. She reports that she does not use drugs.  ROS: 12 point review systems negative except as noted in the HPI  Physical Exam: BP 107/69   Pulse 76   Ht 5\' 4"  (1.626 m)   Wt 165 lb (74.8 kg)   LMP 04/03/2018 (Exact Date)   BMI 28.32 kg/m   Constitutional:  Alert and  oriented, No acute distress. HEENT: [place] AT, moist mucus membranes.  Trachea midline, no masses. Cardiovascular: No clubbing, cyanosis, or edema. Respiratory: Normal respiratory effort, no increased work of breathing. Skin: No rashes, bruises or suspicious lesions. Neurologic: Grossly intact, no focal deficits, moving all 4 extremities. Psychiatric: Normal mood and affect.  Laboratory Data:  Urinalysis: Microscopy >30 RBC  I have reviewed UA. See Epic.   Assessment & Plan:    1.  Personal history urinary calculi  -KUB today and if nothing obvious, will proceed with CT ccan -Will call with KUB results   2.  Stress urinary incontinence -Presently mild and not bothersome  Abbie Sons, MD  [facility] [address] [place], [place] [address] [phone]  I, Lucas Mallow, am acting as a scribe for Dr. Nicki Reaper C. ,  I, Abbie Sons, MD, have reviewed all documentation for this visit. The documentation on 04/20/18 for the exam, diagnosis, procedures, and orders are all accurate and complete.

## 2018-04-20 NOTE — Addendum Note (Signed)
Addended by: Tommy Rainwater on: 04/20/2018 01:05 PM   Modules accepted: Orders

## 2018-04-20 NOTE — Progress Notes (Signed)
04/20/2018  11:04 AM   Tiffany Acevedo 12/20/1965 [id number]  Referring provider: Crecencio Mc, MD [place], [place] [address]  Chief Complaint  Patient presents with  . Urinary Incontinence    New Patient    HPI: Tiffany Acevedo is a 53 yo F who presents today for the evaluation and management of urinary incontinence.  -Referred to [place] by Sheron Nightingale, PA  -Initially referred for stress incontinence after childbirth however she canceled the appointment due to a conflict. -Stress incontinence is mild and not bothersome. -Pelvic pressure with gross hematuria since 04/13/2018  -2 day history of dark urine -Prior history of kidney stones with multiple procedures done in the past  -Denies pain or burning with urination   PMH: Past Medical History:  Diagnosis Date  . Allergy   . Anxiety   . Arthritis   . Depression   . Excessive weight gain 06/30/2016  . Frequent headaches   . Hypertension   . Migraines   . Renal calculi     Surgical History: Past Surgical History:  Procedure Laterality Date  . [place], 2004, 2013  . COLONOSCOPY  2015  . FRACTURE SURGERY     right ankle  . INCONTINENCE SURGERY    . PILONIDAL CYST EXCISION    . TONSILLECTOMY AND ADENOIDECTOMY      Home Medications:  Allergies as of 04/20/2018      Reactions   Percocet [oxycodone-acetaminophen] Other (See Comments)   Sulfa Antibiotics Other (See Comments)   Sulfonamide Derivatives    REACTION: unspecified      Medication List       Accurate as of April 20, 2018 11:04 AM. Always use your most recent med list.        Aimovig (140 MG Dose) 70 MG/ML Soaj Generic drug:  Erenumab-aooe Inject into the skin every 30 (thirty) days.   AZO-CRANBERRY PO Take 1 tablet by mouth daily.   hydrochlorothiazide 12.5 MG capsule Commonly known as:  Microzide Take 1 capsule (12.5 mg total) by mouth daily.   ibuprofen 800 MG tablet Commonly known as:   ADVIL,MOTRIN Take 800 mg by mouth as needed.   imipramine 50 MG tablet Commonly known as:  TOFRANIL Take 50 mg by mouth at bedtime.   metoprolol succinate 100 MG 24 hr tablet Commonly known as:  TOPROL-XL Take by mouth.   ondansetron 4 MG disintegrating tablet Commonly known as:  Zofran ODT Take 1 tablet (4 mg total) by mouth every 8 (eight) hours as needed for nausea or vomiting.   rizatriptan 10 MG tablet Commonly known as:  MAXALT Take 10 mg by mouth as needed for migraine.   venlafaxine XR 150 MG 24 hr capsule Commonly known as:  EFFEXOR-XR TAKE 1 CAPSULE BY MOUTH EVERY DAY WITH BREAKFAST       Allergies:  Allergies  Allergen Reactions  . Percocet [Oxycodone-Acetaminophen] Other (See Comments)  . Sulfa Antibiotics Other (See Comments)  . Sulfonamide Derivatives     REACTION: unspecified    Family History: Family History  Problem Relation Age of Onset  . Hyperlipidemia Mother   . Hypertension Mother   . Depression Mother   . Cancer Mother   . Alcohol abuse Father   . Hyperlipidemia Father   . Stroke Father   . Hypertension Father   . Depression Father   . Cancer Father   . Arthritis Maternal Grandmother   . Hypertension Maternal Grandmother   .  Depression Maternal Grandmother   . Arthritis Maternal Grandfather   . Stroke Maternal Grandfather   . Hypertension Maternal Grandfather   . Depression Maternal Grandfather   . Hypertension Paternal Grandmother   . Depression Paternal Grandmother   . Hypertension Paternal Grandfather   . Depression Paternal Grandfather     Social History:  reports that she has never smoked. She has never used smokeless tobacco. She reports current alcohol use. She reports that she does not use drugs.  ROS: 12 point review systems negative except as noted in the HPI  Physical Exam: BP 107/69   Pulse 76   Ht 5\' 4"  (1.626 m)   Wt 165 lb (74.8 kg)   LMP 04/03/2018 (Exact Date)   BMI 28.32 kg/m   Constitutional:  Alert and  oriented, No acute distress. HEENT: [place] AT, moist mucus membranes.  Trachea midline, no masses. Cardiovascular: No clubbing, cyanosis, or edema. Respiratory: Normal respiratory effort, no increased work of breathing. Skin: No rashes, bruises or suspicious lesions. Neurologic: Grossly intact, no focal deficits, moving all 4 extremities. Psychiatric: Normal mood and affect.  Laboratory Data:  Urinalysis: Microscopy >30 RBC  I have reviewed UA. See Epic.   Assessment & Plan:    1.  Personal history urinary calculi  -KUB today and if nothing obvious, will proceed with CT ccan -Will call with KUB results   2.  Stress urinary incontinence -Presently mild and not bothersome  Abbie Sons, MD  [facility] [address], [place],  [address] [phone]  I, Lucas Mallow, am acting as a scribe for Dr. Nicki Reaper C. Rodnisha Blomgren,  I, Abbie Sons, MD, have reviewed all documentation for this visit. The documentation on 04/20/18 for the exam, diagnosis, procedures, and orders are all accurate and complete.

## 2018-04-21 ENCOUNTER — Telehealth: Payer: Self-pay

## 2018-04-21 ENCOUNTER — Other Ambulatory Visit: Payer: Self-pay | Admitting: Urology

## 2018-04-21 DIAGNOSIS — N200 Calculus of kidney: Secondary | ICD-10-CM

## 2018-04-21 NOTE — Telephone Encounter (Signed)
Called pt, no answer. Left detailed message informing pt of the information below. Advised pt to call back for questions or concerns.

## 2018-04-21 NOTE — Telephone Encounter (Signed)
-----   Message from Abbie Sons, MD sent at 04/21/2018  7:12 AM EDT ----- Urinalysis does show an abnormal amount of blood.  KUB does show multiple renal calculi.  She may have a stone in the upper ureter and lower ureter on the left.  Recommend CT for further evaluation.  I put in an order and will notify with results.

## 2018-04-23 LAB — CULTURE, URINE COMPREHENSIVE

## 2018-04-26 ENCOUNTER — Other Ambulatory Visit: Payer: Self-pay

## 2018-04-26 ENCOUNTER — Ambulatory Visit
Admission: RE | Admit: 2018-04-26 | Discharge: 2018-04-26 | Disposition: A | Discharge status: Home / Self Care | Payer: BC Managed Care – PPO | Source: Ambulatory Visit | Attending: Urology | Admitting: Urology

## 2018-04-26 DIAGNOSIS — N200 Calculus of kidney: Secondary | ICD-10-CM | POA: Diagnosis not present

## 2018-05-01 ENCOUNTER — Telehealth: Payer: Self-pay | Admitting: Urology

## 2018-05-01 NOTE — Telephone Encounter (Signed)
I contacted Ms. Jetter regarding her recent CT results.  She does have left hydronephrosis and 3 calculi in the ureter measuring 8 mm, 3 mm, 3 mm and 6 mm.  She also has bilateral nonobstructing renal calculi including a 13 mm right UPJ stone.  Stone density on the left side is 1800 HU.  Would not recommend ESWL and discussed left ureteroscopy.  Her right-sided stones can be treated in the future.  She would like to proceed with left ureteroscopy.  The indications and nature of the planned procedure were discussed as well as the potential  benefits and expected outcome.  Alternatives have been discussed in detail. The most common complications and side effects were discussed including but not limited to infection/sepsis; blood loss; damage to urethra, bladder, ureter, kidney; need for multiple surgeries; need for prolonged stent placement as well as general anesthesia risks. Although uncommon she was also informed of the possibility that the upper calculus may not be able to be treated due to inability to obtain access to the upper ureter. In that event she would require stent placement and a follow-up procedure after a period of stent dilation. All of her questions were answered and she desires to proceed.  At the time of our visit she did not appear to be distracted or in pain.

## 2018-05-02 ENCOUNTER — Other Ambulatory Visit: Payer: Self-pay | Admitting: Radiology

## 2018-05-02 DIAGNOSIS — N200 Calculus of kidney: Secondary | ICD-10-CM

## 2018-05-04 ENCOUNTER — Other Ambulatory Visit: Payer: Self-pay

## 2018-05-04 ENCOUNTER — Encounter
Admission: RE | Admit: 2018-05-04 | Discharge: 2018-05-04 | Disposition: A | Discharge status: Home / Self Care | Payer: BC Managed Care – PPO | Source: Ambulatory Visit | Attending: Urology | Admitting: Urology

## 2018-05-04 HISTORY — DX: Personal history of urinary calculi: Z87.442

## 2018-05-04 HISTORY — DX: Prediabetes: R73.03

## 2018-05-04 HISTORY — DX: Gastro-esophageal reflux disease without esophagitis: K21.9

## 2018-05-04 NOTE — Patient Instructions (Signed)
Your procedure is scheduled on: Tuesday, March 31 Report to Day Surgery on the 2nd floor of the [facility]. To find out your arrival time, please call [phone] between 1PM - 3PM on: Monday, March 30  REMEMBER: Instructions that are not followed completely may result in serious medical risk, up to and including death; or upon the discretion of your surgeon and anesthesiologist your surgery may need to be rescheduled.  Do not eat food after midnight the night before surgery.  No gum chewing, lozengers or hard candies.  You may however, drink CLEAR liquids up to 2 hours before you are scheduled to arrive for your surgery. Do not drink anything within 2 hours of the start of your surgery.  Clear liquids include: - water  - apple juice without pulp - gatorade - black coffee or tea (Do NOT add milk or creamers to the coffee or tea) Do NOT drink anything that is not on this list.  No Alcohol for 24 hours before or after surgery.  No Smoking including e-cigarettes for 24 hours prior to surgery.  No chewable tobacco products for at least 6 hours prior to surgery.  No nicotine patches on the day of surgery.  On the morning of surgery brush your teeth with toothpaste and water, you may rinse your mouth with mouthwash if you wish. Do not swallow any toothpaste or mouthwash.  Notify your doctor if there is any change in your medical condition (cold, fever, infection).  Do not wear jewelry, make-up, hairpins, clips or nail polish.  Do not wear lotions, powders, or perfumes.   Do not shave 48 hours prior to surgery.   Contacts and dentures may not be worn into surgery.  Do not bring valuables to the hospital, including drivers license, insurance or credit cards.  [name] is not responsible for any belongings or valuables.   TAKE THESE MEDICATIONS THE MORNING OF SURGERY:  1.  Omeprazole - (take one the night before and one on the morning of surgery - helps to prevent nausea  after surgery.) 2.  Venlafaxine  NOW!  Stop Anti-inflammatories (NSAIDS) such as Advil, Aleve, Ibuprofen, Motrin, Naproxen, Naprosyn and Aspirin based products such as Excedrin, Goodys Powder, BC Powder. (May take Tylenol or Acetaminophen if needed.)  NOW!  Stop ANY OVER THE COUNTER supplements until after surgery.  Wear comfortable clothing (specific to your surgery type) to the hospital.  If you are being discharged the day of surgery, you will not be allowed to drive home. You will need a responsible adult to drive you home and stay with you that night.   If you are taking public transportation, you will need to have a responsible adult with you. Please confirm with your physician that it is acceptable to use public transportation.   Please call [phone] if you have any questions about these instructions.

## 2018-05-08 MED ORDER — CEFAZOLIN SODIUM-DEXTROSE 2-4 GM/100ML-% IV SOLN
2.0000 g | INTRAVENOUS | Status: AC
  Administered 2018-05-09: 2 g via INTRAVENOUS

## 2018-05-09 ENCOUNTER — Encounter: Payer: Self-pay | Admitting: *Deleted

## 2018-05-09 ENCOUNTER — Ambulatory Visit
Admission: RE | Admit: 2018-05-09 | Discharge: 2018-05-09 | Disposition: A | Discharge status: Home / Self Care | Payer: BC Managed Care – PPO | Attending: Urology | Admitting: Urology

## 2018-05-09 ENCOUNTER — Encounter
Admission: RE | Disposition: A | Discharge status: Home / Self Care | Payer: Self-pay | Source: Home / Self Care | Attending: Urology

## 2018-05-09 ENCOUNTER — Ambulatory Visit: Payer: BC Managed Care – PPO | Admitting: Anesthesiology

## 2018-05-09 ENCOUNTER — Other Ambulatory Visit: Payer: Self-pay

## 2018-05-09 DIAGNOSIS — F329 Major depressive disorder, single episode, unspecified: Secondary | ICD-10-CM | POA: Insufficient documentation

## 2018-05-09 DIAGNOSIS — F419 Anxiety disorder, unspecified: Secondary | ICD-10-CM | POA: Insufficient documentation

## 2018-05-09 DIAGNOSIS — N201 Calculus of ureter: Secondary | ICD-10-CM

## 2018-05-09 DIAGNOSIS — Z87442 Personal history of urinary calculi: Secondary | ICD-10-CM | POA: Insufficient documentation

## 2018-05-09 DIAGNOSIS — N200 Calculus of kidney: Secondary | ICD-10-CM | POA: Diagnosis not present

## 2018-05-09 DIAGNOSIS — N393 Stress incontinence (female) (male): Secondary | ICD-10-CM | POA: Diagnosis not present

## 2018-05-09 DIAGNOSIS — M1990 Unspecified osteoarthritis, unspecified site: Secondary | ICD-10-CM | POA: Diagnosis not present

## 2018-05-09 DIAGNOSIS — I10 Essential (primary) hypertension: Secondary | ICD-10-CM | POA: Diagnosis not present

## 2018-05-09 DIAGNOSIS — Z79899 Other long term (current) drug therapy: Secondary | ICD-10-CM | POA: Insufficient documentation

## 2018-05-09 DIAGNOSIS — N132 Hydronephrosis with renal and ureteral calculous obstruction: Secondary | ICD-10-CM | POA: Diagnosis present

## 2018-05-09 DIAGNOSIS — G43909 Migraine, unspecified, not intractable, without status migrainosus: Secondary | ICD-10-CM | POA: Diagnosis not present

## 2018-05-09 DIAGNOSIS — Z882 Allergy status to sulfonamides status: Secondary | ICD-10-CM | POA: Insufficient documentation

## 2018-05-09 DIAGNOSIS — Z885 Allergy status to narcotic agent status: Secondary | ICD-10-CM | POA: Diagnosis not present

## 2018-05-09 HISTORY — PX: CYSTOSCOPY/URETEROSCOPY/HOLMIUM LASER/STENT PLACEMENT: SHX6546

## 2018-05-09 LAB — POCT PREGNANCY, URINE: Preg Test, Ur: NEGATIVE

## 2018-05-09 SURGERY — CYSTOSCOPY/URETEROSCOPY/HOLMIUM LASER/STENT PLACEMENT
Anesthesia: General | Laterality: Left

## 2018-05-09 MED ORDER — MIDAZOLAM HCL 2 MG/2ML IJ SOLN
INTRAMUSCULAR | Status: DC | PRN
  Administered 2018-05-09: 2 mg via INTRAVENOUS

## 2018-05-09 MED ORDER — PROPOFOL 10 MG/ML IV BOLUS
INTRAVENOUS | Status: AC
  Filled 2018-05-09: qty 20

## 2018-05-09 MED ORDER — ROCURONIUM BROMIDE 50 MG/5ML IV SOLN
INTRAVENOUS | Status: AC
  Filled 2018-05-09: qty 1

## 2018-05-09 MED ORDER — LACTATED RINGERS IV SOLN
INTRAVENOUS | Status: DC
  Administered 2018-05-09 (×2): via INTRAVENOUS

## 2018-05-09 MED ORDER — SUGAMMADEX SODIUM 200 MG/2ML IV SOLN
INTRAVENOUS | Status: AC
  Filled 2018-05-09: qty 2

## 2018-05-09 MED ORDER — HYDROCODONE-ACETAMINOPHEN 5-325 MG PO TABS: 1.0000 | ORAL_TABLET | ORAL | 0 refills | Status: DC | PRN

## 2018-05-09 MED ORDER — FENTANYL CITRATE (PF) 100 MCG/2ML IJ SOLN
INTRAMUSCULAR | Status: AC
  Filled 2018-05-09: qty 2

## 2018-05-09 MED ORDER — SUGAMMADEX SODIUM 200 MG/2ML IV SOLN
INTRAVENOUS | Status: DC | PRN
  Administered 2018-05-09: 150 mg via INTRAVENOUS

## 2018-05-09 MED ORDER — PROPOFOL 10 MG/ML IV BOLUS
INTRAVENOUS | Status: DC | PRN
  Administered 2018-05-09: 150 mg via INTRAVENOUS

## 2018-05-09 MED ORDER — CEFAZOLIN SODIUM-DEXTROSE 2-4 GM/100ML-% IV SOLN
INTRAVENOUS | Status: AC
  Filled 2018-05-09: qty 100

## 2018-05-09 MED ORDER — SUCCINYLCHOLINE CHLORIDE 20 MG/ML IJ SOLN
INTRAMUSCULAR | Status: AC
  Filled 2018-05-09: qty 1

## 2018-05-09 MED ORDER — FENTANYL CITRATE (PF) 100 MCG/2ML IJ SOLN: 25.0000 ug | INTRAMUSCULAR | Status: DC | PRN

## 2018-05-09 MED ORDER — DEXAMETHASONE SODIUM PHOSPHATE 10 MG/ML IJ SOLN
INTRAMUSCULAR | Status: AC
  Filled 2018-05-09: qty 1

## 2018-05-09 MED ORDER — ONDANSETRON HCL 4 MG/2ML IJ SOLN
INTRAMUSCULAR | Status: AC
  Filled 2018-05-09: qty 2

## 2018-05-09 MED ORDER — MIDAZOLAM HCL 2 MG/2ML IJ SOLN
INTRAMUSCULAR | Status: AC
  Filled 2018-05-09: qty 2

## 2018-05-09 MED ORDER — FENTANYL CITRATE (PF) 100 MCG/2ML IJ SOLN
INTRAMUSCULAR | Status: DC | PRN
  Administered 2018-05-09 (×2): 50 ug via INTRAVENOUS

## 2018-05-09 MED ORDER — DEXAMETHASONE SODIUM PHOSPHATE 10 MG/ML IJ SOLN
INTRAMUSCULAR | Status: DC | PRN
  Administered 2018-05-09: 10 mg via INTRAVENOUS

## 2018-05-09 MED ORDER — LIDOCAINE HCL (CARDIAC) PF 100 MG/5ML IV SOSY
PREFILLED_SYRINGE | INTRAVENOUS | Status: DC | PRN
  Administered 2018-05-09: 100 mg via INTRAVENOUS

## 2018-05-09 MED ORDER — LIDOCAINE HCL (PF) 2 % IJ SOLN
INTRAMUSCULAR | Status: AC
  Filled 2018-05-09: qty 10

## 2018-05-09 MED ORDER — ROCURONIUM BROMIDE 100 MG/10ML IV SOLN
INTRAVENOUS | Status: DC | PRN
  Administered 2018-05-09: 10 mg via INTRAVENOUS
  Administered 2018-05-09: 30 mg via INTRAVENOUS

## 2018-05-09 MED ORDER — ONDANSETRON HCL 4 MG/2ML IJ SOLN
INTRAMUSCULAR | Status: DC | PRN
  Administered 2018-05-09: 4 mg via INTRAVENOUS

## 2018-05-09 MED ORDER — OXYBUTYNIN CHLORIDE 5 MG PO TABS: ORAL_TABLET | ORAL | 0 refills | Status: DC

## 2018-05-09 SURGICAL SUPPLY — 30 items
BAG DRAIN CYSTO-URO LG1000N (MISCELLANEOUS) ×2 IMPLANT
BASKET ZERO TIP 1.9FR (BASKET) ×1 IMPLANT
BRUSH SCRUB EZ 1% IODOPHOR (MISCELLANEOUS) ×2 IMPLANT
BSKT STON RTRVL ZERO TP 1.9FR (BASKET) ×1
CATH URETL 5X70 OPEN END (CATHETERS) IMPLANT
CNTNR SPEC 2.5X3XGRAD LEK (MISCELLANEOUS)
CONT SPEC 4OZ STER OR WHT (MISCELLANEOUS)
CONT SPEC 4OZ STRL OR WHT (MISCELLANEOUS)
CONTAINER SPEC 2.5X3XGRAD LEK (MISCELLANEOUS) IMPLANT
DRAPE UTILITY 15X26 TOWEL STRL (DRAPES) ×2 IMPLANT
FIBER LASER LITHO 273 (Laser) ×1 IMPLANT
GLOVE BIO SURGEON STRL SZ8 (GLOVE) ×2 IMPLANT
GOWN STRL REUS W/ TWL LRG LVL3 (GOWN DISPOSABLE) ×1 IMPLANT
GOWN STRL REUS W/ TWL XL LVL3 (GOWN DISPOSABLE) ×1 IMPLANT
GOWN STRL REUS W/TWL LRG LVL3 (GOWN DISPOSABLE) ×2
GOWN STRL REUS W/TWL XL LVL3 (GOWN DISPOSABLE) ×2
GUIDEWIRE STR DUAL SENSOR (WIRE) ×2 IMPLANT
INFUSOR MANOMETER BAG 3000ML (MISCELLANEOUS) ×2 IMPLANT
INTRODUCER DILATOR DOUBLE (INTRODUCER) IMPLANT
KIT TURNOVER CYSTO (KITS) ×2 IMPLANT
PACK CYSTO AR (MISCELLANEOUS) ×2 IMPLANT
SET CYSTO W/LG BORE CLAMP LF (SET/KITS/TRAYS/PACK) ×2 IMPLANT
SHEATH URETERAL 12FRX35CM (MISCELLANEOUS) ×1 IMPLANT
SOL .9 NS 3000ML IRR  AL (IV SOLUTION) ×1
SOL .9 NS 3000ML IRR AL (IV SOLUTION) ×1
SOL .9 NS 3000ML IRR UROMATIC (IV SOLUTION) ×1 IMPLANT
STENT URET 6FRX24 CONTOUR (STENTS) ×1 IMPLANT
STENT URET 6FRX26 CONTOUR (STENTS) IMPLANT
SURGILUBE 2OZ TUBE FLIPTOP (MISCELLANEOUS) ×2 IMPLANT
WATER STERILE IRR 1000ML POUR (IV SOLUTION) ×2 IMPLANT

## 2018-05-09 NOTE — Interval H&P Note (Signed)
History and Physical Interval Note: Her CT did show multiple left ureteral calculi with hydronephrosis.  She also has multiple right renal calculi.  We will plan for a left ureteroscopy/laser lithotripsy/stone removal.  05/09/2018 7:25 AM  Tiffany Acevedo  has presented today for surgery, with the diagnosis of left ureteral calculi-*OBSTRUCTING*.  The various methods of treatment have been discussed with the patient and family. After consideration of risks, benefits and other options for treatment, the patient has consented to  Procedure(s): CYSTOSCOPY/URETEROSCOPY/HOLMIUM LASER/STENT PLACEMENT (Left) as a surgical intervention.  The patient's history has been reviewed, patient examined, no change in status, stable for surgery.  I have reviewed the patient's chart and labs.  Questions were answered to the patient's satisfaction.     [place]

## 2018-05-09 NOTE — Transfer of Care (Signed)
Immediate Anesthesia Transfer of Care Note  Patient: Tiffany Acevedo  Procedure(s) Performed: CYSTOSCOPY/URETEROSCOPY/HOLMIUM LASER/STENT PLACEMENT (Left )  Patient Location: PACU  Anesthesia Type:General  Level of Consciousness: sedated  Airway & Oxygen Therapy: Patient connected to face mask oxygen  Post-op Assessment: Report given to RN and Post -op Vital signs reviewed and stable  Post vital signs: stable  Last Vitals:  Vitals Value Taken Time  BP 124/78 05/09/2018  9:36 AM  Temp 36.1 C 05/09/2018  9:36 AM  Pulse 71 05/09/2018  9:36 AM  Resp 20 05/09/2018  9:36 AM  SpO2 100 % 05/09/2018  9:36 AM  Vitals shown include unvalidated device data.  Last Pain:  Vitals:   05/09/18 0936  TempSrc: Temporal  PainSc:          Complications: No apparent anesthesia complications

## 2018-05-09 NOTE — Anesthesia Post-op Follow-up Note (Signed)
Anesthesia QCDR form completed.        

## 2018-05-09 NOTE — Anesthesia Procedure Notes (Signed)
Procedure Name: Intubation Date/Time: 05/09/2018 7:44 AM Performed by: Aline Brochure, CRNA Pre-anesthesia Checklist: Patient identified, Emergency Drugs available, Suction available and Patient being monitored Patient Re-evaluated:Patient Re-evaluated prior to induction Oxygen Delivery Method: Circle system utilized Preoxygenation: Pre-oxygenation with 100% oxygen Induction Type: IV induction Ventilation: Mask ventilation without difficulty Laryngoscope Size: McGraph and 3 Grade View: Grade I Tube type: Oral Tube size: 7.0 mm Number of attempts: 1 Airway Equipment and Method: Stylet and Video-laryngoscopy Placement Confirmation: ETT inserted through vocal cords under direct vision,  positive ETCO2 and breath sounds checked- equal and bilateral Secured at: 21 cm Tube secured with: Tape Dental Injury: Teeth and Oropharynx as per pre-operative assessment

## 2018-05-09 NOTE — Discharge Instructions (Addendum)
DISCHARGE INSTRUCTIONS FOR KIDNEY STONE/URETERAL STENT   MEDICATIONS:  1. Resume all your other meds from home.  2.  AZO (over-the-counter) can help with the burning/stinging when you urinate. 3.  Hydrocodone is for moderate/severe pain, Rx was sent to your pharmacy. 4.  Oxybutynin is for stent irritation.  Rx was sent to your pharmacy  ACTIVITY:  1. May resume regular activities in 24 hours. 2. No driving while on narcotic pain medications  3. Drink plenty of water  4. Continue to walk at home - you can still get blood clots when you are at home, so keep active, but don't over do it.  5. May return to work/school tomorrow or when you feel ready   BATHING:  1. You can shower or bathe.  SIGNS/SYMPTOMS TO CALL:  Please call us if you have a fever greater than 101.5, uncontrolled nausea/vomiting, uncontrolled pain, dizziness, unable to urinate, excessively bloody urine, chest pain, shortness of breath, leg swelling, leg pain, or any other concerns or questions.   Urinary frequency, urgency, burning and blood in the urine is normal.  You can reach [place] at [phone].   FOLLOW-UP:  1. You will be contacted for a follow-up appointment for stent removal in approximately 2 weeks  AMBULATORY SURGERY  DISCHARGE INSTRUCTIONS   1) The drugs that you were given will stay in your system until tomorrow so for the next 24 hours you should not:  A) Drive an automobile B) Make any legal decisions C) Drink any alcoholic beverage   2) You may resume regular meals tomorrow.  Today it is better to start with liquids and gradually work up to solid foods.  You may eat anything you prefer, but it is better to start with liquids, then soup and crackers, and gradually work up to solid foods.   3) Please notify your doctor immediately if you have any unusual bleeding, trouble breathing, redness and pain at the surgery site, drainage, fever, or pain not relieved by medication.    4) Additional  Instructions: Drink plenty of water and decaffeinated drinks  Please contact your physician with any problems or Same Day Surgery at [phone], Monday through Friday 6 am to 4 pm, or [place] at [facility] number at [phone].

## 2018-05-09 NOTE — Anesthesia Preprocedure Evaluation (Signed)
Anesthesia Evaluation  Patient identified by MRN, date of birth, ID band Patient awake    Reviewed: Allergy & Precautions, H&P , NPO status , Patient's Chart, lab work & pertinent test results  History of Anesthesia Complications Negative for: history of anesthetic complications  Airway Mallampati: III  TM Distance: <3 FB Neck ROM: full    Dental  (+) Chipped   Pulmonary neg pulmonary ROS, neg shortness of breath,           Cardiovascular Exercise Tolerance: Good hypertension, (-) angina(-) Past MI and (-) DOE      Neuro/Psych  Headaches, PSYCHIATRIC DISORDERS negative psych ROS   GI/Hepatic Neg liver ROS, GERD  Medicated and Controlled,  Endo/Other  negative endocrine ROS  Renal/GU Renal disease     Musculoskeletal  (+) Arthritis ,   Abdominal   Peds  Hematology negative hematology ROS (+)   Anesthesia Other Findings Past Medical History: No date: Allergy No date: Anxiety No date: Arthritis No date: Depression 06/30/2016: Excessive weight gain No date: Frequent headaches No date: GERD (gastroesophageal reflux disease) No date: History of kidney stones No date: Hypertension No date: Migraines No date: Pre-diabetes No date: Renal calculi  Past Surgical History: 1994, 2004, 2013: [place] 2015: COLONOSCOPY No date: INCONTINENCE SURGERY     Comment:  BLADDER TUCK No date: PILONIDAL CYST EXCISION No date: UPPER GI ENDOSCOPY  BMI    Body Mass Index:  28.49 kg/m      Reproductive/Obstetrics negative OB ROS                             Anesthesia Physical Anesthesia Plan  ASA: III  Anesthesia Plan: General LMA   Post-op Pain Management:    Induction: Intravenous  PONV Risk Score and Plan: Dexamethasone, Ondansetron, Midazolam and Treatment may vary due to age or medical condition  Airway Management Planned: LMA  Additional Equipment:   Intra-op Plan:    Post-operative Plan: Extubation in OR  Informed Consent: I have reviewed the patients History and Physical, chart, labs and discussed the procedure including the risks, benefits and alternatives for the proposed anesthesia with the patient or authorized representative who has indicated his/her understanding and acceptance.     Dental Advisory Given  Plan Discussed with: Anesthesiologist, CRNA and Surgeon  Anesthesia Plan Comments: (Patient consented for risks of anesthesia including but not limited to:  - adverse reactions to medications - damage to teeth, lips or other oral mucosa - sore throat or hoarseness - Damage to heart, brain, lungs or loss of life  Patient voiced understanding.)        Anesthesia Quick Evaluation

## 2018-05-09 NOTE — Anesthesia Postprocedure Evaluation (Signed)
Anesthesia Post Note  Patient: Tiffany Acevedo  Procedure(s) Performed: CYSTOSCOPY/URETEROSCOPY/HOLMIUM LASER/STENT PLACEMENT (Left )  Patient location during evaluation: PACU Anesthesia Type: General Level of consciousness: awake and alert Pain management: pain level controlled Vital Signs Assessment: post-procedure vital signs reviewed and stable Respiratory status: spontaneous breathing, nonlabored ventilation, respiratory function stable and patient connected to nasal cannula oxygen Cardiovascular status: blood pressure returned to baseline and stable Postop Assessment: no apparent nausea or vomiting Anesthetic complications: no     Last Vitals:  Vitals:   05/09/18 1021 05/09/18 1023  BP:  130/73  Pulse:  72  Resp: 16   Temp: (!) 36.3 C   SpO2:  100%    Last Pain:  Vitals:   05/09/18 1023  TempSrc:   PainSc: 0-No pain                 Precious Haws Piscitello

## 2018-05-09 NOTE — Op Note (Signed)
Preoperative diagnosis:  1.  Left ureteral calculi 2.  Left nephrolithiasis  Postoperative diagnosis:  1.  Left ureteral calculi 2.  Left nephrolithiasis  Procedure:  1. Cystoscopy 2. Left ureteroscopy and stone removal 3. Ureteroscopic laser lithotripsy 4. Left ureteral stent placement (6FR) 24 cm 5. Left retrograde pyelography with interpretation  Surgeon: Nicki Reaper C. Nixon Sparr, M.D.  Anesthesia: General  Complications: None  Intraoperative findings:  1.  Left retrograde pyelography post procedure showed no filling defects, stone fragments or contrast extravasation  EBL: Minimal  Specimens: 1. Calculus fragments for analysis   Indication: Tiffany Acevedo is a 53 y.o. year old female initially seen on 04/20/2018 with a several week history of pelvic pressure and intermittent hematuria.  She has a history of recurrent stone disease.  A stone protocol CT of the abdomen pelvis showed moderate left hydronephrosis and for left ureteral calculi including a 6 mm calculus in the distal ureter, 8 mm proximal ureteral calculus and 2-3 mm mid ureteral calculi.  She also had several nonobstructing calculi in the left kidney.  In addition she had a 13 mm right renal pelvic calculus and additional right renal calculi.  After reviewing the management options for treatment, the patient elected to proceed with the above surgical procedure(s). We have discussed the potential benefits and risks of the procedure, side effects of the proposed treatment, the likelihood of the patient achieving the goals of the procedure, and any potential problems that might occur during the procedure or recuperation. Informed consent has been obtained.  Description of procedure:  The patient was taken to the operating room and general anesthesia was induced.  The patient was placed in the dorsal lithotomy position, prepped and draped in the usual sterile fashion, and preoperative antibiotics were administered. A  preoperative time-out was performed.   A 21 French cystoscope was lubricated and passed per urethra.  Panendoscopy was performed and the bladder mucosa showed no erythema, solid or papillary lesions.  Attention was directed to the left ureteral orifice and a 0.038 Sensor wire was then advanced up the ureter into the renal pelvis under fluoroscopic guidance.  A 4.5 Fr semirigid ureteroscope was then advanced into the ureter next to the guidewire and the 6 mm distal calculus was identified.  The stone was then fragmented with a 273 micron holmium laser fiber on a setting of 0.2 J and frequency of 10 hz.   All stone fragments were then removed from the ureter with a zero tip nitinol basket.  The 3 mm calculi were just proximal to the fragmented stone and removed with the basket.   The ureteroscope was then advanced proximally and the 8 mm calculus was identified in the proximal ureter.  There were moderate inflammatory changes in the ureteral mucosa just distal to the calculus.  The stone was dusted at a setting of 0.2/40 Hz.  After approximately 50% of the stone was dusted it migrated to the kidney.  A second guidewire was placed through the ureteroscope and the semirigid ureteroscope was removed.  A 12/14 French ureteral access sheath was placed over the working wire under fluoroscopic guidance without difficulty.  A 6 French single channel flexible ureteroscope was then placed to the access sheath and advanced into the renal pelvis.  The fragmented calculus was identified in the midpole calyx and further fragmented into 4 fragments which were removed with a nitinol basket.  Several smaller calculi in the lower pole calyx were removed with the nitinol basket.  A 5 mm  calculus was defined with an upper pole calyx which was basketed.  It would not advanced through the access sheath and was fragmented in situ in the basket to a size which allowed removal.  Retrograde pyelogram was performed with  findings as described above.  All calyces were examined and no significant size stone fragments were identified.  A 6 FR/24 CM double-J ureteral stent was was placed under fluoroscopic guidance.  The wire was then removed with an adequate stent curl noted in the renal pelvis as well as in the bladder.  The bladder was then emptied and the procedure ended.  The patient appeared to tolerate the procedure well and without complications.  After anesthetic reversal the patient was transported to the PACU in stable condition.   Plan: Office follow-up 2 weeks for cystoscopy with stent removal   John Giovanni, MD

## 2018-05-15 ENCOUNTER — Telehealth: Payer: Self-pay | Admitting: Urology

## 2018-05-15 DIAGNOSIS — R300 Dysuria: Secondary | ICD-10-CM

## 2018-05-15 NOTE — Telephone Encounter (Signed)
Please call pt and schedule her for a nurse visit tomorrow. Thanks

## 2018-05-15 NOTE — Telephone Encounter (Signed)
Pt states that her urine has a strong odor and cloudy and a lot of pressure. Please advise.

## 2018-05-15 NOTE — Addendum Note (Signed)
Addended by: Donalee Citrin on: 05/15/2018 03:52 PM   Modules accepted: Orders

## 2018-05-16 ENCOUNTER — Other Ambulatory Visit: Payer: Self-pay

## 2018-05-16 ENCOUNTER — Telehealth: Payer: Self-pay

## 2018-05-16 ENCOUNTER — Ambulatory Visit (INDEPENDENT_AMBULATORY_CARE_PROVIDER_SITE_OTHER): Payer: BC Managed Care – PPO

## 2018-05-16 DIAGNOSIS — R300 Dysuria: Secondary | ICD-10-CM | POA: Diagnosis not present

## 2018-05-16 LAB — URINALYSIS, COMPLETE
Bilirubin, UA: NEGATIVE
Glucose, UA: NEGATIVE
Ketones, UA: NEGATIVE
Nitrite, UA: NEGATIVE
Specific Gravity, UA: 1.02 (ref 1.005–1.030)
Urobilinogen, Ur: 0.2 mg/dL (ref 0.2–1.0)
pH, UA: 6 (ref 5.0–7.5)

## 2018-05-16 LAB — MICROSCOPIC EXAMINATION: Epithelial Cells (non renal): >10 /hpf — AB (ref 0–10)

## 2018-05-16 NOTE — Progress Notes (Signed)
Patient presents today complaining of burning/painful urination, occasional flank and lower abdominal pain, and urgency. Patient denies fever and chills. She had ureteroscopy/stent placement  05/09/18 and has felt pressure since then. Burning sensation started 2 nights ago. Urine collected for UA and culture. Advised patient we will call her with results and providers response. Patient verbalized understanding.  Per Dr. Bernardo Heater, patient can try samples of Myrbetriq 25mg  if Oxybutynin is not working since patient is scheduled for cysto stent removal next week. Symptoms are likely related to stent. Will wait on culture results.

## 2018-05-16 NOTE — Telephone Encounter (Signed)
Left message for patient to return call to office. 

## 2018-05-16 NOTE — Telephone Encounter (Signed)
Advised patient of Dr Dagoberto Reef response. Will wait on culture results. Continue Oxybutynin as prescribed and keep cysto appt as scheduled. Patient verbalized understanding.

## 2018-05-18 LAB — CULTURE, URINE COMPREHENSIVE

## 2018-05-19 LAB — CALCULI, WITH PHOTOGRAPH (CLINICAL LAB)
Calcium Oxalate Dihydrate: 10 %
Calcium Oxalate Monohydrate: 80 %
Hydroxyapatite: 10 %
Weight Calculi: 352 mg

## 2018-05-23 ENCOUNTER — Other Ambulatory Visit: Payer: Self-pay

## 2018-05-23 ENCOUNTER — Encounter: Payer: Self-pay | Admitting: Urology

## 2018-05-23 ENCOUNTER — Ambulatory Visit (INDEPENDENT_AMBULATORY_CARE_PROVIDER_SITE_OTHER): Payer: BC Managed Care – PPO | Admitting: Urology

## 2018-05-23 VITALS — BP 131/78 | HR 78 | Ht 64.0 in | Wt 165.0 lb

## 2018-05-23 DIAGNOSIS — R1032 Left lower quadrant pain: Secondary | ICD-10-CM | POA: Insufficient documentation

## 2018-05-23 DIAGNOSIS — N200 Calculus of kidney: Secondary | ICD-10-CM

## 2018-05-23 LAB — URINALYSIS, COMPLETE
Bilirubin, UA: NEGATIVE
Glucose, UA: NEGATIVE
Ketones, UA: NEGATIVE
Nitrite, UA: NEGATIVE
Specific Gravity, UA: 1.02 (ref 1.005–1.030)
Urobilinogen, Ur: 0.2 mg/dL (ref 0.2–1.0)
pH, UA: 7 (ref 5.0–7.5)

## 2018-05-23 LAB — MICROSCOPIC EXAMINATION: RBC, Urine: >30 /HPF — AB (ref 0–2)

## 2018-05-23 MED ORDER — CIPROFLOXACIN HCL 500 MG PO TABS
500.0000 mg | ORAL_TABLET | Freq: Once | ORAL | Status: AC
  Administered 2018-05-23: 500 mg via ORAL

## 2018-05-23 MED ORDER — LIDOCAINE HCL URETHRAL/MUCOSAL 2 % EX GEL
1.0000 "application " | Freq: Once | CUTANEOUS | Status: AC
  Administered 2018-05-23: 1 via URETHRAL

## 2018-05-23 NOTE — Progress Notes (Signed)
Indications: Patient is 53 y.o. who recently underwent ureteroscopic removal of multiple left ureteral calculi on 05/09/2018.  Other than moderate stent irritative symptoms she had no postoperative problems.  The patient is presenting today for stent removal.  Procedure:  Flexible Cystoscopy with stent removal (31540)  Timeout was performed and the correct patient, procedure and participants were identified.    Description:  The patient was prepped and draped in the usual sterile fashion. Flexible cystosopy was performed.  The stent was visualized, grasped, and removed intact without difficulty. The patient tolerated the procedure well.  A single dose of oral antibiotics was given.  Complications:  None  Plan: She has a 13 mm nonobstructing right renal pelvic calculus with Hounsfield units >1600.  Based on stone size and density would not recommend shockwave lithotripsy.  She will be scheduled for right ureteroscopy once we are scheduling elective cases after COVID-19 pandemic improves.  John Giovanni, MD

## 2018-07-08 ENCOUNTER — Other Ambulatory Visit: Payer: Self-pay | Admitting: Internal Medicine

## 2018-08-07 ENCOUNTER — Other Ambulatory Visit: Payer: BC Managed Care – PPO

## 2018-08-07 ENCOUNTER — Other Ambulatory Visit: Payer: Self-pay | Admitting: *Deleted

## 2018-08-07 DIAGNOSIS — Z20822 Contact with and (suspected) exposure to covid-19: Secondary | ICD-10-CM

## 2018-08-10 LAB — NOVEL CORONAVIRUS, NAA: SARS-CoV-2, NAA: DETECTED — AB

## 2018-08-13 ENCOUNTER — Other Ambulatory Visit: Payer: Self-pay | Admitting: Internal Medicine

## 2018-08-18 ENCOUNTER — Other Ambulatory Visit: Payer: Self-pay | Admitting: Internal Medicine

## 2018-09-04 ENCOUNTER — Other Ambulatory Visit: Payer: Self-pay | Admitting: Family Medicine

## 2018-09-04 DIAGNOSIS — N200 Calculus of kidney: Secondary | ICD-10-CM

## 2018-09-05 ENCOUNTER — Other Ambulatory Visit: Payer: BC Managed Care – PPO

## 2018-09-05 ENCOUNTER — Other Ambulatory Visit: Payer: Self-pay

## 2018-09-05 DIAGNOSIS — N200 Calculus of kidney: Secondary | ICD-10-CM

## 2018-09-05 LAB — URINALYSIS, COMPLETE
Bilirubin, UA: NEGATIVE
Glucose, UA: NEGATIVE
Ketones, UA: NEGATIVE
Nitrite, UA: NEGATIVE
Protein,UA: NEGATIVE
Specific Gravity, UA: 1.02 (ref 1.005–1.030)
Urobilinogen, Ur: 0.2 mg/dL (ref 0.2–1.0)
pH, UA: 6 (ref 5.0–7.5)

## 2018-09-05 LAB — MICROSCOPIC EXAMINATION

## 2018-09-06 ENCOUNTER — Inpatient Hospital Stay
Admission: RE | Admit: 2018-09-06 | Discharge: 2018-09-06 | Disposition: A | Discharge status: Home / Self Care | Payer: BC Managed Care – PPO | Source: Ambulatory Visit

## 2018-09-07 ENCOUNTER — Other Ambulatory Visit: Payer: Self-pay

## 2018-09-07 ENCOUNTER — Encounter
Admission: RE | Admit: 2018-09-07 | Discharge: 2018-09-07 | Disposition: A | Discharge status: Home / Self Care | Payer: BC Managed Care – PPO | Source: Ambulatory Visit | Attending: Urology | Admitting: Urology

## 2018-09-07 LAB — CULTURE, URINE COMPREHENSIVE

## 2018-09-07 NOTE — Pre-Procedure Instructions (Signed)
Messaged Dr. Bernardo Heater and Denny Peon that we need orders and H&P

## 2018-09-07 NOTE — Patient Instructions (Signed)
Your procedure is scheduled on: Wed. 09/13/18 Report to Day Surgery. To find out your arrival time please call [phone] between 1PM - 3PM on Tues. 8/4.  Remember: Instructions that are not followed completely may result in serious medical risk,  up to and including death, or upon the discretion of your surgeon and anesthesiologist your  surgery may need to be rescheduled.     _X__ 1. Do not eat food after midnight the night before your procedure.                 No gum chewing or hard candies. You may drink clear liquids up to 2 hours                 before you are scheduled to arrive for your surgery- DO not drink clear                 liquids within 2 hours of the start of your surgery.                 Clear Liquids include:  water, apple juice without pulp, clear carbohydrate                 drink such as Clearfast of Gatorade, Black Coffee or Tea (Do not add                 anything to coffee or tea).  __X__2.  On the morning of surgery brush your teeth with toothpaste and water, you                may rinse your mouth with mouthwash if you wish.  Do not swallow any toothpaste of mouthwash.     _X__ 3.  No Alcohol for 24 hours before or after surgery.   ___ 4.  Do Not Smoke or use e-cigarettes For 24 Hours Prior to Your Surgery.                 Do not use any chewable tobacco products for at least 6 hours prior to                 surgery.  ____  5.  Bring all medications with you on the day of surgery if instructed.   __x__  6.  Notify your doctor if there is any change in your medical condition      (cold, fever, infections).     Do not wear jewelry, make-up, hairpins, clips or nail polish. Do not wear lotions, powders, or perfumes. You may wear deodorant. Do not shave 48 hours prior to surgery. Men may shave face and neck. Do not bring valuables to the hospital.    [facility] is not responsible for any belongings or valuables.  Contacts, dentures  or bridgework may not be worn into surgery. Leave your suitcase in the car. After surgery it may be brought to your room. For patients admitted to the hospital, discharge time is determined by your treatment team.   Patients discharged the day of surgery will not be allowed to drive home.   Please read over the following fact sheets that you were given:    _x___ Take these medicines the morning of surgery with A SIP OF WATER:    1. omeprazole (PRILOSEC) 20 MG capsule dose the night before and morning of surgery  2. venlafaxine XR (EFFEXOR-XR) 150 MG 24 hr capsule  3.   4.  5.  6.  ____ [employer]  Enema (as directed)   ____ Use CHG Soap as directed  ____ Use inhalers on the day of surgery  ____ Stop metformin 2 days prior to surgery    ____ Take 1/2 of usual insulin dose the night before surgery. No insulin the morning          of surgery.   ____ Stop Coumadin/Plavix/aspirin on   __x__ Stop Anti-inflammatories ibuprofen (ADVIL,MOTRIN) 200 MG tablet, ALeve, or Aspirin today  May take tylenol   ____ Stop supplements until after surgery.    ____ Bring C-Pap to the hospital.

## 2018-09-08 ENCOUNTER — Other Ambulatory Visit
Admission: RE | Admit: 2018-09-08 | Discharge: 2018-09-08 | Disposition: A | Discharge status: Home / Self Care | Payer: BC Managed Care – PPO | Source: Ambulatory Visit | Attending: Urology | Admitting: Urology

## 2018-09-08 DIAGNOSIS — U071 COVID-19: Secondary | ICD-10-CM | POA: Insufficient documentation

## 2018-09-08 DIAGNOSIS — Z01812 Encounter for preprocedural laboratory examination: Secondary | ICD-10-CM | POA: Diagnosis present

## 2018-09-08 LAB — POTASSIUM: Potassium: 3.5 mmol/L (ref 3.5–5.1)

## 2018-09-09 ENCOUNTER — Other Ambulatory Visit: Payer: Self-pay | Admitting: Internal Medicine

## 2018-09-09 LAB — SARS CORONAVIRUS 2 (TAT 6-24 HRS): SARS Coronavirus 2: POSITIVE — AB

## 2018-09-13 ENCOUNTER — Ambulatory Visit: Payer: BC Managed Care – PPO | Admitting: Anesthesiology

## 2018-09-13 ENCOUNTER — Ambulatory Visit
Admission: RE | Admit: 2018-09-13 | Discharge: 2018-09-13 | Disposition: A | Discharge status: Home / Self Care | Payer: BC Managed Care – PPO | Attending: Urology | Admitting: Urology

## 2018-09-13 ENCOUNTER — Other Ambulatory Visit: Payer: Self-pay

## 2018-09-13 ENCOUNTER — Encounter: Payer: Self-pay | Admitting: *Deleted

## 2018-09-13 ENCOUNTER — Encounter
Admission: RE | Disposition: A | Discharge status: Home / Self Care | Payer: Self-pay | Source: Home / Self Care | Attending: Urology

## 2018-09-13 ENCOUNTER — Telehealth: Payer: Self-pay | Admitting: Urology

## 2018-09-13 DIAGNOSIS — Z79899 Other long term (current) drug therapy: Secondary | ICD-10-CM | POA: Insufficient documentation

## 2018-09-13 DIAGNOSIS — I10 Essential (primary) hypertension: Secondary | ICD-10-CM | POA: Insufficient documentation

## 2018-09-13 DIAGNOSIS — F419 Anxiety disorder, unspecified: Secondary | ICD-10-CM | POA: Diagnosis not present

## 2018-09-13 DIAGNOSIS — F329 Major depressive disorder, single episode, unspecified: Secondary | ICD-10-CM | POA: Insufficient documentation

## 2018-09-13 DIAGNOSIS — N200 Calculus of kidney: Secondary | ICD-10-CM | POA: Insufficient documentation

## 2018-09-13 DIAGNOSIS — G43909 Migraine, unspecified, not intractable, without status migrainosus: Secondary | ICD-10-CM | POA: Diagnosis not present

## 2018-09-13 HISTORY — PX: CYSTOSCOPY/URETEROSCOPY/HOLMIUM LASER/STENT PLACEMENT: SHX6546

## 2018-09-13 HISTORY — PX: CYSTOSCOPY W/ RETROGRADES: SHX1426

## 2018-09-13 HISTORY — PX: STONE EXTRACTION WITH BASKET: SHX5318

## 2018-09-13 SURGERY — CYSTOSCOPY/URETEROSCOPY/HOLMIUM LASER/STENT PLACEMENT
Anesthesia: General | Site: Ureter | Laterality: Right

## 2018-09-13 MED ORDER — CEFAZOLIN SODIUM-DEXTROSE 2-4 GM/100ML-% IV SOLN
INTRAVENOUS | Status: AC
  Filled 2018-09-13: qty 100

## 2018-09-13 MED ORDER — EPHEDRINE SULFATE 50 MG/ML IJ SOLN
INTRAMUSCULAR | Status: AC
  Filled 2018-09-13: qty 1

## 2018-09-13 MED ORDER — CEFAZOLIN SODIUM-DEXTROSE 2-4 GM/100ML-% IV SOLN: 2.0000 g | Freq: Once | INTRAVENOUS | Status: DC

## 2018-09-13 MED ORDER — ONDANSETRON HCL 4 MG/2ML IJ SOLN
INTRAMUSCULAR | Status: DC | PRN
  Administered 2018-09-13: 4 mg via INTRAVENOUS

## 2018-09-13 MED ORDER — LIDOCAINE HCL (PF) 2 % IJ SOLN
INTRAMUSCULAR | Status: AC
  Filled 2018-09-13: qty 10

## 2018-09-13 MED ORDER — SODIUM CHLORIDE FLUSH 0.9 % IV SOLN
INTRAVENOUS | Status: AC
  Filled 2018-09-13: qty 10

## 2018-09-13 MED ORDER — PROPOFOL 10 MG/ML IV BOLUS
INTRAVENOUS | Status: DC | PRN
  Administered 2018-09-13: 30 mg via INTRAVENOUS
  Administered 2018-09-13: 130 mg via INTRAVENOUS

## 2018-09-13 MED ORDER — PROPOFOL 10 MG/ML IV BOLUS
INTRAVENOUS | Status: AC
  Filled 2018-09-13: qty 20

## 2018-09-13 MED ORDER — PROMETHAZINE HCL 25 MG/ML IJ SOLN
INTRAMUSCULAR | Status: AC
  Administered 2018-09-13: 6.25 mg via INTRAVENOUS
  Filled 2018-09-13: qty 1

## 2018-09-13 MED ORDER — LIDOCAINE HCL (CARDIAC) PF 100 MG/5ML IV SOSY
PREFILLED_SYRINGE | INTRAVENOUS | Status: DC | PRN
  Administered 2018-09-13: 70 mg via INTRAVENOUS

## 2018-09-13 MED ORDER — FENTANYL CITRATE (PF) 100 MCG/2ML IJ SOLN
INTRAMUSCULAR | Status: AC
  Filled 2018-09-13: qty 2

## 2018-09-13 MED ORDER — OXYBUTYNIN CHLORIDE 5 MG PO TABS: ORAL_TABLET | ORAL | 0 refills | Status: DC

## 2018-09-13 MED ORDER — MIDAZOLAM HCL 2 MG/2ML IJ SOLN
INTRAMUSCULAR | Status: DC | PRN
  Administered 2018-09-13: 2 mg via INTRAVENOUS

## 2018-09-13 MED ORDER — LACTATED RINGERS IV SOLN: INTRAVENOUS | Status: DC

## 2018-09-13 MED ORDER — FENTANYL CITRATE (PF) 100 MCG/2ML IJ SOLN: 25.0000 ug | INTRAMUSCULAR | Status: DC | PRN

## 2018-09-13 MED ORDER — PROMETHAZINE HCL 25 MG/ML IJ SOLN
6.2500 mg | Freq: Once | INTRAMUSCULAR | Status: AC
  Administered 2018-09-13: 12:00:00 6.25 mg via INTRAVENOUS

## 2018-09-13 MED ORDER — DEXAMETHASONE SODIUM PHOSPHATE 10 MG/ML IJ SOLN
INTRAMUSCULAR | Status: DC | PRN
  Administered 2018-09-13: 5 mg via INTRAVENOUS

## 2018-09-13 MED ORDER — ROCURONIUM BROMIDE 100 MG/10ML IV SOLN
INTRAVENOUS | Status: DC | PRN
  Administered 2018-09-13: 50 mg via INTRAVENOUS

## 2018-09-13 MED ORDER — SEVOFLURANE IN SOLN
RESPIRATORY_TRACT | Status: AC
  Filled 2018-09-13: qty 250

## 2018-09-13 MED ORDER — LACTATED RINGERS IV SOLN
INTRAVENOUS | Status: DC | PRN
  Administered 2018-09-13: 09:00:00 via INTRAVENOUS

## 2018-09-13 MED ORDER — IOPAMIDOL (ISOVUE-200) INJECTION 41%
INTRAVENOUS | Status: DC | PRN
  Administered 2018-09-13: 20 mL

## 2018-09-13 MED ORDER — PHENYLEPHRINE HCL (PRESSORS) 10 MG/ML IV SOLN
INTRAVENOUS | Status: AC
  Filled 2018-09-13: qty 1

## 2018-09-13 MED ORDER — TAMSULOSIN HCL 0.4 MG PO CAPS: 0.4000 mg | ORAL_CAPSULE | Freq: Every day | ORAL | 0 refills | Status: DC

## 2018-09-13 MED ORDER — SUGAMMADEX SODIUM 500 MG/5ML IV SOLN
INTRAVENOUS | Status: DC | PRN
  Administered 2018-09-13: 200 mg via INTRAVENOUS

## 2018-09-13 MED ORDER — DEXAMETHASONE SODIUM PHOSPHATE 10 MG/ML IJ SOLN
INTRAMUSCULAR | Status: AC
  Filled 2018-09-13: qty 1

## 2018-09-13 MED ORDER — FENTANYL CITRATE (PF) 100 MCG/2ML IJ SOLN
INTRAMUSCULAR | Status: DC | PRN
  Administered 2018-09-13 (×2): 50 ug via INTRAVENOUS

## 2018-09-13 MED ORDER — ONDANSETRON HCL 4 MG/2ML IJ SOLN
INTRAMUSCULAR | Status: AC
  Filled 2018-09-13: qty 2

## 2018-09-13 MED ORDER — MIDAZOLAM HCL 2 MG/2ML IJ SOLN
INTRAMUSCULAR | Status: AC
  Filled 2018-09-13: qty 2

## 2018-09-13 SURGICAL SUPPLY — 31 items
BAG DRAIN CYSTO-URO LG1000N (MISCELLANEOUS) ×2 IMPLANT
BASKET ZERO TIP 1.9FR (BASKET) IMPLANT
BRUSH SCRUB EZ 1% IODOPHOR (MISCELLANEOUS) ×2 IMPLANT
BSKT STON RTRVL ZERO TP 1.9FR (BASKET)
CATH URETL 5X70 OPEN END (CATHETERS) IMPLANT
CNTNR SPEC 2.5X3XGRAD LEK (MISCELLANEOUS)
CONT SPEC 4OZ STER OR WHT (MISCELLANEOUS)
CONT SPEC 4OZ STRL OR WHT (MISCELLANEOUS)
CONTAINER SPEC 2.5X3XGRAD LEK (MISCELLANEOUS) IMPLANT
DRAPE UTILITY 15X26 TOWEL STRL (DRAPES) ×2 IMPLANT
FIBER LASER TRAC TIP (UROLOGICAL SUPPLIES) ×1 IMPLANT
GLOVE BIO SURGEON STRL SZ8 (GLOVE) ×2 IMPLANT
GOWN STRL REUS W/ TWL LRG LVL3 (GOWN DISPOSABLE) ×1 IMPLANT
GOWN STRL REUS W/ TWL XL LVL3 (GOWN DISPOSABLE) ×1 IMPLANT
GOWN STRL REUS W/TWL LRG LVL3 (GOWN DISPOSABLE) ×2
GOWN STRL REUS W/TWL XL LVL3 (GOWN DISPOSABLE) ×2
GUIDEWIRE STR DUAL SENSOR (WIRE) ×3 IMPLANT
INFUSOR MANOMETER BAG 3000ML (MISCELLANEOUS) ×2 IMPLANT
INTRODUCER DILATOR DOUBLE (INTRODUCER) ×1 IMPLANT
KIT TURNOVER CYSTO (KITS) ×2 IMPLANT
PACK CYSTO AR (MISCELLANEOUS) ×2 IMPLANT
SET CYSTO W/LG BORE CLAMP LF (SET/KITS/TRAYS/PACK) ×2 IMPLANT
SHEATH URETERAL 12FRX35CM (MISCELLANEOUS) ×1 IMPLANT
SOL .9 NS 3000ML IRR  AL (IV SOLUTION) ×1
SOL .9 NS 3000ML IRR AL (IV SOLUTION) ×1
SOL .9 NS 3000ML IRR UROMATIC (IV SOLUTION) ×1 IMPLANT
STENT URET 6FRX22 CONTOUR (STENTS) ×1 IMPLANT
STENT URET 6FRX24 CONTOUR (STENTS) IMPLANT
STENT URET 6FRX26 CONTOUR (STENTS) IMPLANT
SURGILUBE 2OZ TUBE FLIPTOP (MISCELLANEOUS) ×2 IMPLANT
WATER STERILE IRR 1000ML POUR (IV SOLUTION) ×2 IMPLANT

## 2018-09-13 NOTE — Telephone Encounter (Signed)
-----   Message from Abbie Sons, MD sent at 09/13/2018 11:42 AM EDT ----- Needs appointment for cystoscopy with stent removal 7-10 days.  KUB prior to appointment

## 2018-09-13 NOTE — H&P (Signed)
09/13/2018 8:05 AM   Tiffany Acevedo 1965/12/07 [id number]  Referring provider: No referring provider defined for this encounter.   HPI: 53 y.o. female with a history of recurrent stone disease.  She underwent treatment of left ureteral and left renal calculi April 2020. She has a nonobstructing 12 mm right renal pelvic calculus and presents today for definitive stone management.  The stone measured 1600 HU making shockwave lithotripsy less ideal and is scheduled for ureteroscopy with laser lithotripsy.  PMH: Past Medical History:  Diagnosis Date  . Allergy   . Anxiety   . Arthritis   . Depression   . Excessive weight gain 06/30/2016  . Frequent headaches   . GERD (gastroesophageal reflux disease)   . History of kidney stones   . Hypertension   . Migraines   . Pre-diabetes     Surgical History: Past Surgical History:  Procedure Laterality Date  . [place], 2004, 2013  . COLONOSCOPY  2015  . CYSTOSCOPY/URETEROSCOPY/HOLMIUM LASER/STENT PLACEMENT Left 05/09/2018   Procedure: CYSTOSCOPY/URETEROSCOPY/HOLMIUM LASER/STENT PLACEMENT;  Surgeon: Abbie Sons, MD;  Location: [facility] ORS;  Service: Urology;  Laterality: Left;  . INCONTINENCE SURGERY     BLADDER TUCK  . PILONIDAL CYST EXCISION    . UPPER GI ENDOSCOPY      Home Medications:  Aimovig (140 MG Dose) 70 MG/ML Soaj Generic drug:  Erenumab-aooe Inject into the skin every 30 (thirty) days.   AZO-CRANBERRY PO Take 1 tablet by mouth daily.   hydrochlorothiazide 12.5 MG capsule Commonly known as:  Microzide Take 1 capsule (12.5 mg total) by mouth daily.   ibuprofen 800 MG tablet Commonly known as:  ADVIL,MOTRIN Take 800 mg by mouth as needed.   imipramine 50 MG tablet Commonly known as:  TOFRANIL Take 50 mg by mouth at bedtime.   metoprolol succinate 100 MG 24 hr tablet Commonly known as:  TOPROL-XL Take by mouth.   ondansetron 4 MG disintegrating tablet Commonly known as:   Zofran ODT Take 1 tablet (4 mg total) by mouth every 8 (eight) hours as needed for nausea or vomiting.   rizatriptan 10 MG tablet Commonly known as:  MAXALT Take 10 mg by mouth as needed for migraine.   venlafaxine XR 150 MG 24 hr capsule Commonly known as:  EFFEXOR-XR TAKE 1 CAPSULE BY MOUTH EVERY DAY WITH BREAKFAST     Allergies:  Allergies  Allergen Reactions  . Percocet [Oxycodone-Acetaminophen] Other (See Comments)    Arms and legs go numb  . Sulfa Antibiotics Nausea And Vomiting    Family History: Family History  Problem Relation Age of Onset  . Hyperlipidemia Mother   . Hypertension Mother   . Depression Mother   . Cancer Mother   . Alcohol abuse Father   . Hyperlipidemia Father   . Stroke Father   . Hypertension Father   . Depression Father   . Cancer Father   . Heart disease Father   . Arthritis Maternal Grandmother   . Hypertension Maternal Grandmother   . Depression Maternal Grandmother   . Arthritis Maternal Grandfather   . Stroke Maternal Grandfather   . Hypertension Maternal Grandfather   . Depression Maternal Grandfather   . Hypertension Paternal Grandmother   . Depression Paternal Grandmother   . Hypertension Paternal Grandfather   . Depression Paternal Grandfather     Social History:  reports that she has never smoked. She has never used smokeless tobacco. She reports current alcohol use. She reports that she  does not use drugs.  ROS: Positive coronavirus late June 2020 but asymptomatic.  Denies cough, shortness of breath, fever, chills  Physical Exam: BP 110/70   Pulse 79   Temp 99 F (37.2 C) (Oral)   Resp 16   Ht 5\' 3"  (1.6 m)   Wt 72.6 kg   SpO2 100%   BMI 28.34 kg/m   Constitutional:  Alert and oriented, No acute distress. HEENT: [place] AT, moist mucus membranes.  Trachea midline, no masses. Cardiovascular: No clubbing, cyanosis, or edema.  RRR Respiratory: Normal respiratory effort, no increased work of breathing.  Clear GI:  Abdomen is soft, nontender, nondistended, no abdominal masses GU: No CVA tenderness Lymph: No cervical or inguinal lymphadenopathy. Skin: No rashes, bruises or suspicious lesions. Neurologic: Grossly intact, no focal deficits, moving all 4 extremities. Psychiatric: Normal mood and affect.   Assessment & Plan:   Right renal pelvic calculus for right ureteroscopy, laser lithotripsy and stone removal.  Procedure has been discussed in detail including potential risks of bleeding, infection, ureteral injury, renal injury.  It was also stressed there is no guarantee stone removal be successful in the possibility of stent placement for inability to access the upper tract was discussed.  She indicated all questions were answered and desires to proceed.   Abbie Sons, [place] [address] [place] [employer], [place] [address] [phone]

## 2018-09-13 NOTE — Anesthesia Post-op Follow-up Note (Signed)
Anesthesia QCDR form completed.        

## 2018-09-13 NOTE — Interval H&P Note (Signed)
History and Physical Interval Note:  09/13/2018 8:22 AM  Tiffany Acevedo  has presented today for surgery, with the diagnosis of RIGHT NEPHROLITHIASIS.  The various methods of treatment have been discussed with the patient and family. After consideration of risks, benefits and other options for treatment, the patient has consented to  Procedure(s): CYSTOSCOPY/URETEROSCOPY/HOLMIUM LASER/STENT PLACEMENT (Right) as a surgical intervention.  The patient's history has been reviewed, patient examined, no change in status, stable for surgery.  I have reviewed the patient's chart and labs.  Questions were answered to the patient's satisfaction.     [place]

## 2018-09-13 NOTE — Discharge Instructions (Signed)
AMBULATORY SURGERY  DISCHARGE INSTRUCTIONS   1) The drugs that you were given will stay in your system until tomorrow so for the next 24 hours you should not:  A) Drive an automobile B) Make any legal decisions C) Drink any alcoholic beverage   2) You may resume regular meals tomorrow.  Today it is better to start with liquids and gradually work up to solid foods.  You may eat anything you prefer, but it is better to start with liquids, then soup and crackers, and gradually work up to solid foods.   3) Please notify your doctor immediately if you have any unusual bleeding, trouble breathing, redness and pain at the surgery site, drainage, fever, or pain not relieved by medication.    4) Additional Instructions:                                                                                 DISCHARGE INSTRUCTIONS FOR KIDNEY STONE/URETERAL STENT   MEDICATIONS:  1. Resume all your other meds from home.  2.  AZO (over-the-counter) can help with the burning/stinging when you urinate. 3.  Oxybutynin and tamsulosin are for stent irritation, Rxs was sent to your pharmacy. 4.  Myrbetriq samples 1 daily is for stent irritation  ACTIVITY:  1. May resume regular activities in 24 hours. 2. No driving while on narcotic pain medications  3. Drink plenty of water  4. Continue to walk at home - you can still get blood clots when you are at home, so keep active, but don't over do it.  5. May return to work/school tomorrow or when you feel ready   BATHING:  1. You can shower or base.  SIGNS/SYMPTOMS TO CALL:  Please call us if you have a fever greater than 101.5, uncontrolled nausea/vomiting, uncontrolled pain, dizziness, unable to urinate, excessively bloody urine, chest pain, shortness of breath, leg swelling, leg pain, or any other concerns or questions.   Urinary frequency, urgency and bladder spasm are common postoperative symptoms.  You can reach [place] at [phone].   FOLLOW-UP:    1. You we will be contacted by our office for an appointment for stent removal in approximately 1 week.          Please contact your physician with any problems or Same Day Surgery at [phone], Monday through Friday 6 am to 4 pm, or [place] at [facility] number at [phone].

## 2018-09-13 NOTE — Telephone Encounter (Signed)
App made ° ° °Michelle  °

## 2018-09-13 NOTE — Anesthesia Postprocedure Evaluation (Signed)
Anesthesia Post Note  Patient: Tiffany Acevedo  Procedure(s) Performed: CYSTOSCOPY/URETEROSCOPY/HOLMIUM LASER/STENT PLACEMENT (Right Ureter) CYSTOSCOPY WITH RETROGRADE PYELOGRAM (Right Ureter) STONE EXTRACTION WITH BASKET (Right Ureter)  Patient location during evaluation: PACU Anesthesia Type: General Level of consciousness: awake and alert Pain management: pain level controlled Vital Signs Assessment: post-procedure vital signs reviewed and stable Respiratory status: spontaneous breathing, nonlabored ventilation and respiratory function stable Cardiovascular status: blood pressure returned to baseline and stable Postop Assessment: no apparent nausea or vomiting Anesthetic complications: yes Anesthetic complication details: PONVComments: PONV with Decadron and Zofran ppx.  Recommend adding scopolamine patch to next anesthetic.     Last Vitals:  Vitals:   09/13/18 1200 09/13/18 1214  BP:  (!) 124/108  Pulse: 82 79  Resp: 15 18  Temp: 36.7 C (!) 36.1 C  SpO2: 96% 99%    Last Pain:  Vitals:   09/13/18 1214  TempSrc: Temporal  PainSc: 0-No pain                 Durenda Hurt

## 2018-09-13 NOTE — Transfer of Care (Signed)
Immediate Anesthesia Transfer of Care Note  Patient: Tiffany Acevedo  Procedure(s) Performed: CYSTOSCOPY/URETEROSCOPY/HOLMIUM LASER/STENT PLACEMENT (Right Ureter) CYSTOSCOPY WITH RETROGRADE PYELOGRAM (Right Ureter) STONE EXTRACTION WITH BASKET (Right Ureter)  Patient Location: PACU  Anesthesia Type:General and Regional  Level of Consciousness: sedated  Airway & Oxygen Therapy: Patient Spontanous Breathing and Patient connected to face mask oxygen  Post-op Assessment: Report given to RN and Post -op Vital signs reviewed and stable  Post vital signs: Reviewed and stable  Last Vitals:  Vitals Value Taken Time  BP 140/66 09/13/18 1122  Temp 36.2 C 09/13/18 1121  Pulse 41 09/13/18 1126  Resp 11 09/13/18 1126  SpO2 93 % 09/13/18 1126  Vitals shown include unvalidated device data.  Last Pain:  Vitals:   09/13/18 1121  TempSrc:   PainSc: 0-No pain         Complications: No apparent anesthesia complications

## 2018-09-13 NOTE — Op Note (Signed)
Preoperative diagnosis: Right nephrolithiasis   Postoperative diagnosis: Right nephrolithiasis  Procedure:  1. Cystoscopy 2. Right ureteroscopy and stone removal 3. Ureteroscopic laser lithotripsy 4. Right ureteral stent placement (6FR) 22 cm 5. Right retrograde pyelography with interpretation  Surgeon: Nicki Reaper C. Stoioff, M.D.  Anesthesia: General  Complications: None  Intraoperative findings:  1.  Ureteroscopic-13 mm renal pelvic calculus, 10 mm middle calyceal calculus, cluster calcifications in an upper middle calyx largest measuring 8 mm.  Multiple Randall plaques noted  2.  Right retrograde pyelography post procedure showed no filling defects, stone fragments or contrast extravasation  EBL: Minimal  Specimens: 1. Calculus fragments for analysis   Indication: Tiffany Acevedo is a 53 y.o. year old patient with recurrent stone disease.  She underwent ureteroscopy for multiple left renal/ureteral calculi March 2020 and presents today for ureteroscopic treatment of right nephrolithiasis including a 13 mm renal pelvic calculus.  Stone density was >1600 HU.  After reviewing the management options for treatment, the patient elected to proceed with the above surgical procedure(s). We have discussed the potential benefits and risks of the procedure, side effects of the proposed treatment, the likelihood of the patient achieving the goals of the procedure, and any potential problems that might occur during the procedure or recuperation. Informed consent has been obtained.  Description of procedure:  The patient was taken to the operating room and general anesthesia was induced.  The patient was placed in the dorsal lithotomy position, prepped and draped in the usual sterile fashion, and preoperative antibiotics were administered. A preoperative time-out was performed.   A 21 French cystoscope was lubricated and passed per urethra.  Panendoscopy was performed and the bladder  mucosa showed no erythema, solid or papillary lesions.  Attention was directed to the right ureteral orifice and a 0.038 Sensor wire was then advanced up the ureter into the renal pelvis under fluoroscopic guidance.  The cystoscope was removed and a dual-lumen catheter was placed over the sensor wire and a second sensor wire was placed in a similar fashion.  A 12/14 French ureteral access sheath was placed over the working wire under fluoroscopic guidance without difficulty.  A digital flexible ureteroscope was placed through the access sheath and advanced into the renal pelvis without difficulty.  Contrast was injected through the ureteroscope and all calyces were examined with findings as described above.  A 200 micron holmium laser fiber was placed through the ureteroscope and the renal pelvic stone was dusted/fragmented at a setting of 0.2 J and frequency of 40 hz which was subsequently increased to 0.4/53.  Once this calculus was adequately dusted the 10 mm middle calyceal calculus was dusted in a similar fashion at settings of 0.2 J / 40 Hz.  The cluster of calcifications in the upper middle pole calyx were then basketed with the exception of the 8 mm calculus which was fragmented and removed with a 1.9 [place] nitinol basket.  Larger remaining stone fragments measuring 2 to 3 mm were then removed from the collecting system with a zero tip nitinol basket.  Retrograde pyelogram was performed and each calyx was sequentially examined under fluoroscopic guidance and no significant size fragments were identified.  The ureteral access sheath and ureteroscope were removed in tandem and the ureter showed no evidence of injury or perforation.  A 6 FR/22 CM stent was was placed under fluoroscopic guidance.  The wire was then removed with an adequate stent curl noted in the renal pelvis as well as in the bladder.  The bladder was then emptied and the procedure ended.  The patient appeared to tolerate the  procedure well and without complications.  After anesthetic reversal the patient was transported to the PACU in stable condition.   Plan: She will return to the office in approximately 1 week for cystoscopy with stent removal.   John Giovanni, MD

## 2018-09-13 NOTE — Anesthesia Procedure Notes (Signed)
Procedure Name: Intubation Date/Time: 09/13/2018 8:49 AM Performed by: Justus Memory, CRNA Pre-anesthesia Checklist: Patient identified, Patient being monitored, Timeout performed, Emergency Drugs available and Suction available Patient Re-evaluated:Patient Re-evaluated prior to induction Oxygen Delivery Method: Circle system utilized Preoxygenation: Pre-oxygenation with 100% oxygen Induction Type: IV induction Ventilation: Mask ventilation without difficulty Laryngoscope Size: Mac, 3 and McGraph Grade View: Grade I Tube type: Oral Tube size: 7.0 mm Number of attempts: 1 Airway Equipment and Method: Stylet Placement Confirmation: ETT inserted through vocal cords under direct vision,  positive ETCO2 and breath sounds checked- equal and bilateral Secured at: 20 cm Tube secured with: Tape Dental Injury: Teeth and Oropharynx as per pre-operative assessment

## 2018-09-13 NOTE — Anesthesia Preprocedure Evaluation (Addendum)
Anesthesia Evaluation  Patient identified by MRN, date of birth, ID band Patient awake    Reviewed: Allergy & Precautions, H&P , NPO status , Patient's Chart, lab work & pertinent test results  Airway Mallampati: II  TM Distance: <3 FB Neck ROM: full    Dental  (+) Teeth Intact   Pulmonary neg pulmonary ROS, neg shortness of breath, neg COPD, neg recent URI,           Cardiovascular hypertension, (-) angina(-) Past MI and (-) Cardiac Stents negative cardio ROS  (-) dysrhythmias      Neuro/Psych  Headaches, PSYCHIATRIC DISORDERS Anxiety Depression    GI/Hepatic Neg liver ROS, GERD  ,  Endo/Other  negative endocrine ROS  Renal/GU nephrolithiasis     Musculoskeletal  (+) Arthritis ,   Abdominal   Peds  Hematology negative hematology ROS (+)   Anesthesia Other Findings Past Medical History: No date: Allergy No date: Anxiety No date: Arthritis No date: Depression 06/30/2016: Excessive weight gain No date: Frequent headaches No date: GERD (gastroesophageal reflux disease) No date: History of kidney stones No date: Hypertension No date: Migraines No date: Pre-diabetes  Past Surgical History: 1994, 2004, 2013: CESAREAN SECTION 2015: COLONOSCOPY 05/09/2018: CYSTOSCOPY/URETEROSCOPY/HOLMIUM LASER/STENT PLACEMENT; Left     Comment:  Procedure: CYSTOSCOPY/URETEROSCOPY/HOLMIUM LASER/STENT               PLACEMENT;  Surgeon: Abbie Sons, MD;  Location:               [facility];  Service: Urology;  Laterality: Left; No date: INCONTINENCE SURGERY     Comment:  BLADDER TUCK No date: PILONIDAL CYST EXCISION No date: UPPER GI ENDOSCOPY  BMI    Body Mass Index: 28.34 kg/m      Reproductive/Obstetrics negative OB ROS                           Anesthesia Physical Anesthesia Plan  ASA: II  Anesthesia Plan: General ETT   Post-op Pain Management:    Induction:   PONV Risk Score and Plan:  Ondansetron, Dexamethasone, Midazolam and Treatment may vary due to age or medical condition  Airway Management Planned:   Additional Equipment:   Intra-op Plan:   Post-operative Plan:   Informed Consent: I have reviewed the patients History and Physical, chart, labs and discussed the procedure including the risks, benefits and alternatives for the proposed anesthesia with the patient or authorized representative who has indicated his/her understanding and acceptance.     Dental Advisory Given  Plan Discussed with: Anesthesiologist and CRNA  Anesthesia Plan Comments:         Anesthesia Quick Evaluation

## 2018-09-14 ENCOUNTER — Encounter: Payer: Self-pay | Admitting: Urology

## 2018-09-14 ENCOUNTER — Other Ambulatory Visit: Payer: Self-pay

## 2018-09-14 ENCOUNTER — Emergency Department: Payer: BC Managed Care – PPO

## 2018-09-14 ENCOUNTER — Emergency Department
Admission: EM | Admit: 2018-09-14 | Discharge: 2018-09-14 | Disposition: A | Discharge status: Home / Self Care | Payer: BC Managed Care – PPO | Attending: Emergency Medicine | Admitting: Emergency Medicine

## 2018-09-14 ENCOUNTER — Telehealth: Payer: Self-pay | Admitting: Radiology

## 2018-09-14 ENCOUNTER — Other Ambulatory Visit: Payer: Self-pay | Admitting: Internal Medicine

## 2018-09-14 DIAGNOSIS — T8384XA Pain from genitourinary prosthetic devices, implants and grafts, initial encounter: Secondary | ICD-10-CM | POA: Diagnosis not present

## 2018-09-14 DIAGNOSIS — I10 Essential (primary) hypertension: Secondary | ICD-10-CM | POA: Diagnosis not present

## 2018-09-14 DIAGNOSIS — Z79899 Other long term (current) drug therapy: Secondary | ICD-10-CM | POA: Insufficient documentation

## 2018-09-14 DIAGNOSIS — Y69 Unspecified misadventure during surgical and medical care: Secondary | ICD-10-CM | POA: Diagnosis not present

## 2018-09-14 DIAGNOSIS — R109 Unspecified abdominal pain: Secondary | ICD-10-CM | POA: Diagnosis present

## 2018-09-14 DIAGNOSIS — R319 Hematuria, unspecified: Secondary | ICD-10-CM | POA: Diagnosis not present

## 2018-09-14 LAB — COMPREHENSIVE METABOLIC PANEL
ALT: 36 U/L (ref 0–44)
AST: 26 U/L (ref 15–41)
Albumin: 4.2 g/dL (ref 3.5–5.0)
Alkaline Phosphatase: 75 U/L (ref 38–126)
Anion gap: 10 (ref 5–15)
BUN: 20 mg/dL (ref 6–20)
CO2: 25 mmol/L (ref 22–32)
Calcium: 9.5 mg/dL (ref 8.9–10.3)
Chloride: 106 mmol/L (ref 98–111)
Creatinine, Ser: 0.98 mg/dL (ref 0.44–1.00)
GFR calc Af Amer: >60 mL/min (ref >60)
GFR calc non Af Amer: >60 mL/min (ref >60)
Glucose, Bld: 124 mg/dL — ABNORMAL HIGH (ref 70–99)
Potassium: 3.7 mmol/L (ref 3.5–5.1)
Sodium: 141 mmol/L (ref 135–145)
Total Bilirubin: 0.5 mg/dL (ref 0.3–1.2)
Total Protein: 7.6 g/dL (ref 6.5–8.1)

## 2018-09-14 LAB — CBC
HCT: 38.9 % (ref 36.0–46.0)
Hemoglobin: 12.8 g/dL (ref 12.0–15.0)
MCH: 28.1 pg (ref 26.0–34.0)
MCHC: 32.9 g/dL (ref 30.0–36.0)
MCV: 85.5 fL (ref 80.0–100.0)
Platelets: 257 10*3/uL (ref 150–400)
RBC: 4.55 MIL/uL (ref 3.87–5.11)
RDW: 12.9 % (ref 11.5–15.5)
WBC: 17.7 10*3/uL — ABNORMAL HIGH (ref 4.0–10.5)
nRBC: 0 % (ref 0.0–0.2)

## 2018-09-14 LAB — URINALYSIS, COMPLETE (UACMP) WITH MICROSCOPIC
Bacteria, UA: NONE SEEN
Bilirubin Urine: NEGATIVE
Glucose, UA: NEGATIVE mg/dL
Ketones, ur: NEGATIVE mg/dL
Nitrite: NEGATIVE
Protein, ur: 30 mg/dL — AB
RBC / HPF: >50 RBC/hpf — ABNORMAL HIGH (ref 0–5)
Specific Gravity, Urine: 1.011 (ref 1.005–1.030)
pH: 7 (ref 5.0–8.0)

## 2018-09-14 LAB — LIPASE, BLOOD: Lipase: 37 U/L (ref 11–51)

## 2018-09-14 LAB — POCT PREGNANCY, URINE: Preg Test, Ur: NEGATIVE

## 2018-09-14 MED ORDER — HYDROMORPHONE HCL 1 MG/ML IJ SOLN
1.0000 mg | Freq: Once | INTRAMUSCULAR | Status: AC
  Administered 2018-09-14: 1 mg via INTRAVENOUS
  Filled 2018-09-14: qty 1

## 2018-09-14 MED ORDER — HYDROCODONE-ACETAMINOPHEN 5-325 MG PO TABS: 2.0000 | ORAL_TABLET | Freq: Once | ORAL | Status: DC

## 2018-09-14 MED ORDER — SODIUM CHLORIDE 0.9 % IV BOLUS
500.0000 mL | Freq: Once | INTRAVENOUS | Status: AC
  Administered 2018-09-14: 500 mL via INTRAVENOUS

## 2018-09-14 MED ORDER — KETOROLAC TROMETHAMINE 30 MG/ML IJ SOLN
15.0000 mg | Freq: Once | INTRAMUSCULAR | Status: AC
  Administered 2018-09-14: 15 mg via INTRAVENOUS
  Filled 2018-09-14: qty 1

## 2018-09-14 MED ORDER — ONDANSETRON HCL 4 MG/2ML IJ SOLN
4.0000 mg | Freq: Once | INTRAMUSCULAR | Status: AC
  Administered 2018-09-14: 4 mg via INTRAVENOUS
  Filled 2018-09-14: qty 2

## 2018-09-14 MED ORDER — FENTANYL CITRATE (PF) 100 MCG/2ML IJ SOLN
50.0000 ug | Freq: Once | INTRAMUSCULAR | Status: AC
  Administered 2018-09-14: 50 ug via INTRAVENOUS
  Filled 2018-09-14: qty 2

## 2018-09-14 MED ORDER — ONDANSETRON HCL 4 MG PO TABS: 4.0000 mg | ORAL_TABLET | Freq: Three times a day (TID) | ORAL | 0 refills | Status: DC | PRN

## 2018-09-14 MED ORDER — SODIUM CHLORIDE 0.9 % IV SOLN
1.0000 g | Freq: Once | INTRAVENOUS | Status: AC
  Administered 2018-09-14: 1 g via INTRAVENOUS
  Filled 2018-09-14: qty 10

## 2018-09-14 MED ORDER — HYDROCODONE-ACETAMINOPHEN 5-300 MG PO TABS: 1.0000 | ORAL_TABLET | Freq: Four times a day (QID) | ORAL | 0 refills | Status: DC | PRN

## 2018-09-14 MED ORDER — CEPHALEXIN 500 MG PO CAPS: 500.0000 mg | ORAL_CAPSULE | Freq: Three times a day (TID) | ORAL | 0 refills | Status: AC

## 2018-09-14 NOTE — Telephone Encounter (Signed)
Advised patient to go to the ER to get pain under control per Dr Bernardo Heater. Patient expresses understanding.

## 2018-09-14 NOTE — ED Provider Notes (Addendum)
[facility] Emergency Department Provider Note  ____________________________________________   I have reviewed the triage vital signs and the nursing notes. Where available I have reviewed prior notes and, if possible and indicated, outside hospital notes.   Patient seen and evaluated during the coronavirus epidemic during a time with low staffing  Patient seen for the symptoms described in the history of present illness. She was evaluated in the context of the global COVID-19 pandemic, which necessitated consideration that the patient might be at risk for infection with the SARS-CoV-2 virus that causes COVID-19. Institutional protocols and algorithms that pertain to the evaluation of patients at risk for COVID-19 are in a state of rapid change based on information released by regulatory bodies including the CDC and federal and state organizations. These policies and algorithms were followed during the patient's care in the ED.    HISTORY  Chief Complaint Abdominal Pain    HPI Tiffany Acevedo is a 53 y.o. female  With a history of kidney stones, and migraines, that has had a procedure yesterday done for multiple kidney stones, had stents placed on the right.  Had significant discomfort.  Despite taking home antispasmodics.  Was sent here by her urologist.  Nausea but no vomiting, also no fever.  Pain is on the right side, sharp, denies significant dysuria.  Has had some hematuria since the procedure.   Seve Dilaudid in the waiting room and feels better at this time.   Past Medical History:  Diagnosis Date  . Allergy   . Anxiety   . Arthritis   . Depression   . Excessive weight gain 06/30/2016  . Frequent headaches   . GERD (gastroesophageal reflux disease)   . History of kidney stones   . Hypertension   . Migraines   . Pre-diabetes     Patient Active Problem List   Diagnosis Date Noted  . Left lower quadrant pain 05/23/2018  . Personal history  of kidney stones 04/20/2018  . Microscopic hematuria 04/20/2018  . 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 Overweight (BMI 25.0-29.9) 01/22/2017  . Encounter for preventive health examination 06/30/2016  . Renal colic 02/54/2706  . Ureteral calculus 04/12/2015  . Nephrolithiasis 03/04/2015  . Intractable migraine with aura without status migrainosus 10/29/2013  . Lack of libido 06/24/2013  . Migraine syndrome 03/11/2013  . Generalized anxiety disorder 03/11/2013    Past Surgical History:  Procedure Laterality Date  . [place], 2004, 2013  . COLONOSCOPY  2015  . CYSTOSCOPY W/ RETROGRADES Right 09/13/2018   Procedure: CYSTOSCOPY WITH RETROGRADE PYELOGRAM;  Surgeon: Abbie Sons, MD;  Location: [facility];  Service: Urology;  Laterality: Right;  . CYSTOSCOPY/URETEROSCOPY/HOLMIUM LASER/STENT PLACEMENT Left 05/09/2018   Procedure: CYSTOSCOPY/URETEROSCOPY/HOLMIUM LASER/STENT PLACEMENT;  Surgeon: Abbie Sons, MD;  Location: [facility] OR[facility];  Service: Urology;  Laterality: Left;  . CYSTOSCOPY/URETEROSCOPY/HOLMIUM LASER/STENT PLACEMENT Right 09/13/2018   Procedure: CYSTOSCOPY/URETEROSCOPY/HOLMIUM LASER/STENT PLACEMENT;  Surgeon: Abbie Sons, MD;  Location: [facility];  Service: Urology;  Laterality: Right;  . INCONTINENCE SURGERY     BLADDER TUCK  . PILONIDAL CYST EXCISION    . STONE EXTRACTION WITH BASKET Right 09/13/2018   Procedure: STONE EXTRACTION WITH BASKET;  Surgeon: Abbie Sons, MD;  Location: [facility];  Service: Urology;  Laterality: Right;  . UPPER GI ENDOSCOPY      Prior to Admission medications   Medication Sig Start Date End Date Taking? Authorizing Provider  EMGALITY 120 MG/ML SOAJ Inject  120 mg into the skin every 28 (twenty-eight) days. 04/24/18   [provider]  hydrochlorothiazide (MICROZIDE) 12.5 MG capsule Take 1 capsule (12.5 mg total) by mouth daily. 01/20/17   Crecencio Mc, MD  ibuprofen (ADVIL,MOTRIN) 200 MG tablet Take 400  mg by mouth every 6 (six) hours as needed for headache or moderate pain.     [provider]  imipramine (TOFRANIL) 50 MG tablet Take 50 mg by mouth at bedtime.  02/28/13   [provider]  metoprolol succinate (TOPROL-XL) 100 MG 24 hr tablet Take 100 mg by mouth at bedtime.     [provider]  Multiple Vitamin (MULTIVITAMIN PO) Take 1 tablet by mouth daily.    [provider]  omeprazole (PRILOSEC) 20 MG capsule Take 20 mg by mouth daily.  04/07/18   [provider]  oxybutynin (DITROPAN) 5 MG tablet 1 tab tid prn frequency,urgency, bladder spasm 09/13/18   Stoioff, Ronda Fairly, MD  rizatriptan (MAXALT) 10 MG tablet Take 10 mg by mouth as needed for migraine.  02/02/13   [provider]  tamsulosin (FLOMAX) 0.4 MG CAPS capsule Take 1 capsule (0.4 mg total) by mouth daily. 09/13/18   Stoioff, Ronda Fairly, MD  venlafaxine XR (EFFEXOR-XR) 150 MG 24 hr capsule TAKE 1 CAPSULE BY MOUTH EVERY DAY WITH BREAKFAST Patient taking differently: Take 150 mg by mouth daily with breakfast.  01/16/18   Crecencio Mc, MD    Allergies Percocet [oxycodone-acetaminophen] and Sulfa antibiotics  Family History  Problem Relation Age of Onset  . Hyperlipidemia Mother   . Hypertension Mother   . Depression Mother   . Cancer Mother   . Alcohol abuse Father   . Hyperlipidemia Father   . Stroke Father   . Hypertension Father   . Depression Father   . Cancer Father   . Heart disease Father   . Arthritis Maternal Grandmother   . Hypertension Maternal Grandmother   . Depression Maternal Grandmother   . Arthritis Maternal Grandfather   . Stroke Maternal Grandfather   . Hypertension Maternal Grandfather   . Depression Maternal Grandfather   . Hypertension Paternal Grandmother   . Depression Paternal Grandmother   . Hypertension Paternal Grandfather   . Depression Paternal Grandfather     Social History Social History   Tobacco Use  . Smoking status: Never Smoker   . Smokeless tobacco: Never Used  Substance Use Topics  . Alcohol use: Yes    Comment: glass of wine occassionally  . Drug use: No    Review of Systems Constitutional: No fever/chills Eyes: No visual changes. ENT: No sore throat. No stiff neck no neck pain Cardiovascular: Denies chest pain. Respiratory: Denies shortness of breath. Gastrointestinal:   no vomiting.  No diarrhea.  No constipation. Genitourinary: Negative for dysuria. Musculoskeletal: Negative lower extremity swelling Skin: Negative for rash. Neurological: Negative for severe headaches, focal weakness or numbness.   ____________________________________________   PHYSICAL EXAM:  VITAL SIGNS: ED Triage Vitals  Enc Vitals Group     BP 09/14/18 1203 (!) 154/96     Pulse Rate 09/14/18 1203 68     Resp 09/14/18 1203 20     Temp 09/14/18 1203 98.3 F (36.8 C)     Temp Source 09/14/18 1203 Oral     SpO2 09/14/18 1203 99 %     Weight --      Height --      Head Circumference --      Peak Flow --  Pain Score 09/14/18 1327 4     Pain Loc --      Pain Edu? --      Excl. in [place]? --     Constitutional: Alert and oriented. Well appearing and in no acute distress. Eyes: Conjunctivae are normal Head: Atraumatic HEENT: No congestion/rhinnorhea. Mucous membranes are moist.  Oropharynx non-erythematous Neck:   Nontender with no meningismus, no masses, no stridor Cardiovascular: Normal rate, regular rhythm. Grossly normal heart sounds.  Good peripheral circulation. Respiratory: Normal respiratory effort.  No retractions. Lungs CTAB. Abdominal: Soft and nontender. No distention. No guarding no rebound Back:  There is no focal tenderness or step off.  there is no midline tenderness there are no lesions noted. there is right-sided CVA tenderness Musculoskeletal: No lower extremity tenderness, no upper extremity tenderness. No joint effusions, no DVT signs strong distal pulses no edema Neurologic:  Normal speech and  language. No gross focal neurologic deficits are appreciated.  Skin:  Skin is warm, dry and intact. No rash noted. Psychiatric: Mood and affect are normal. Speech and behavior are normal.  ____________________________________________   LABS (all labs ordered are listed, but only abnormal results are displayed)  Labs Reviewed  COMPREHENSIVE METABOLIC PANEL - Abnormal; Notable for the following components:      Result Value   Glucose, Bld 124 (*)    All other components within normal limits  CBC - Abnormal; Notable for the following components:   WBC 17.7 (*)    All other components within normal limits  URINALYSIS, COMPLETE (UACMP) WITH MICROSCOPIC - Abnormal; Notable for the following components:   Color, Urine RED (*)    APPearance HAZY (*)    Hgb urine dipstick LARGE (*)    Protein, ur 30 (*)    Leukocytes,Ua LARGE (*)    RBC / HPF >50 (*)    All other components within normal limits  LIPASE, BLOOD  POC URINE PREG, ED  POC URINE PREG, ED  POCT PREGNANCY, URINE    Pertinent labs  results that were available during my care of the patient were reviewed by me and considered in my medical decision making (see chart for details). ____________________________________________  EKG  I personally interpreted any EKGs ordered by me or triage  ____________________________________________  RADIOLOGY  Pertinent labs & imaging results that were available during my care of the patient were reviewed by me and considered in my medical decision making (see chart for details). If possible, patient and/or family made aware of any abnormal findings.  Ct Renal Stone Study  Result Date: 09/14/2018 CLINICAL DATA:  Abdominal pain. Double-J stent on right. Multiple renal calculi EXAM: CT ABDOMEN AND PELVIS WITHOUT CONTRAST TECHNIQUE: Multidetector CT imaging of the abdomen and pelvis was performed following the standard protocol without oral or IV contrast. COMPARISON:  April 26, 2018 FINDINGS:  Lower chest: Lung bases are clear.  There is a small hiatal hernia. Hepatobiliary: There is diffuse hepatic steatosis. No focal liver lesions are evident on this noncontrast enhanced study. Gallbladder wall is not appreciably thickened. There is no biliary duct dilatation. Pancreas: No pancreatic mass or inflammatory focus evident. Spleen: No splenic lesions are evident. Adrenals/Urinary Tract: Adrenals bilaterally appear normal. There is a cyst arising from the posterior mid to lower portion of the left kidney measuring 2.9 x 2.1 cm. There is a 1.5 x 1.5 cm cyst in the mid left kidney. There is moderate hydronephrosis on the right. Double-J stent is present proximally at the right ureteropelvic junction level,  extending to the level of the urinary bladder. There are multiple calculi within calices on both sides, ranging in size from as small as 1 mm to as large as 1 cm. A previous calculus noted in the right renal pelvis has apparently migrated to the level of the right ureter in the mid ureteral region, immediately adjacent to the double-J stent. There is no other ureteral calculus evident on either side. Urinary bladder is midline with wall thickness within normal limits. A small amount of air in the urinary bladder potentially may be iatrogenic but also could indicate gas-forming organism within the urinary bladder. Stomach/Bowel: There is no appreciable bowel wall or mesenteric thickening. There is moderate stool in the colon. No bowel obstruction evident. Terminal ileum appears unremarkable. There is no evident free air or portal venous air. Vascular/Lymphatic: There is no abdominal aortic aneurysm. No vascular lesions are evident on this noncontrast enhanced study. There is no evident adenopathy in the abdomen or pelvis. Reproductive: Uterus is anteverted.  No evident pelvic mass. Other: Appendix appears unremarkable. No abscess or ascites is evident in the abdomen or pelvis. Musculoskeletal: No blastic or  lytic bone lesions. No intramuscular or abdominal wall lesions are evident. IMPRESSION: 1. Moderate hydronephrosis on the right. Double-J stent extends currently from the right ureteropelvic junction to the bladder. 2. Previously noted calculus measuring slightly over 1 cm in size has apparently migrated from the right renal pelvis to the right ureter at the mid sacral level. Immediately adjacent double-J stent noted. 3. Multiple intrarenal calculi noted throughout multiple calices ranging in size from as small as 1 mm to as large as 1 cm. 4. Air within the urinary bladder may be iatrogenic in etiology but also could be indicative of gas-forming organism. Advise correlation with urinalysis in this regard. 5. No bowel obstruction. No abscess in the abdomen or pelvis. Appendix appears normal. 6.  Diffuse hepatic steatosis. 7.  Small hiatal hernia. Electronically Signed   By: Lowella Grip III M.D.   On: 09/14/2018 14:01   ____________________________________________    PROCEDURES  Procedure(s) performed: None  Procedures  Critical Care performed: None  ____________________________________________   INITIAL IMPRESSION / ASSESSMENT AND PLAN / ED COURSE  Pertinent labs & imaging results that were available during my care of the patient were reviewed by me and considered in my medical decision making (see chart for details).   Patient with significant discomfort helped by pain medication, in the context of recent stent.  CT scan and blood work and urinalysis noted.  Will talk to Dr. Bernardo Heater for further guidance.  ----------------------------------------- 6:16 PM on 09/14/2018 -----------------------------------------  Is able to talk to Dr. Windy Carina much appreciate consult, we discussed the patient's spine including physical findings history, CT findings in detail, as well as urinalysis white count etc.  After discussion of all of her findings, urology does agree with an empiric dose of  antibiotics although he feels most likely given her procedure this is simply reactive and not infective urinalysis.  He does agree however starting on antibiotics pending culture.  He does not feel she needs to be admitted if we can good pain control here.  He does agree with Toradol which we have ordered.  We will reassess.  ----------------------------------------- 7:29 PM on 09/14/2018 -----------------------------------------  And is in no acute distress I did offer her admission for further pain control and evaluation of her urine etc. and she declines.  She understands the risk benefits and alternatives to admission and she would  like to go home.  Her significant other is in the room and agrees with this plan.  She does want Vicodin.  She states Percocet makes her body feel numb but she has had Vicodin with no problem in the past.  Urine culture is pending.  Unclear if she actually has a urinary tract infection given her immediate post procedure urine according to urology but we will keep her on Keflex pending culture which has been sent that is in process.  Given that she declines admission, and the urology feels she is safe for discharge we will discharge her with return precautions given and understood patient is very comfortable this plan advised not to drive on the pain medication she is received here in the future.     ____________________________________________   FINAL CLINICAL IMPRESSION(S) / ED DIAGNOSES  Final diagnoses:  None      This chart was dictated using voice recognition software.  Despite best efforts to proofread,  errors can occur which can change meaning.      Schuyler Amor, MD 09/14/18 1800    Schuyler Amor, MD 09/14/18 1816    Schuyler Amor, MD 09/14/18 [phone]

## 2018-09-14 NOTE — Telephone Encounter (Signed)
Patient reports severe pain in right lower back radiating around to abdomen. Asks for "a shot or something". Please advise.

## 2018-09-14 NOTE — ED Triage Notes (Signed)
Patient arrived to ED lobby tearful, restless, c/o right abdominal pain that radiates to the back. Patient had a cystoscopy yesterday.

## 2018-09-14 NOTE — Discharge Instructions (Signed)
We have offered you admission but you would prefer to go home. This is of course your choice, however does limit our ability to monitor you as we indicated.  If you have therefore increased pain, fever, vomiting, or you feel worse in any way please return to the emergency department.  Please follow-up with Dr. Bernardo Heater first thing tomorrow morning.  Do not drink or drive on the pain medications we have given you here.

## 2018-09-15 ENCOUNTER — Telehealth: Payer: Self-pay

## 2018-09-15 LAB — URINE CULTURE: Culture: NO GROWTH

## 2018-09-15 NOTE — Telephone Encounter (Signed)
I reviewed her CT.  There are multiple small fragments alongside the stent which will need to pass before the stent can be removed.  There are also multiple small fragments in the kidneys which I think can be observed.  Her stent is in good position and her pain is going to be stent related.  Unfortunately at this point the stent will need to stay and we can get a follow-up x-ray early next week.

## 2018-09-15 NOTE — Telephone Encounter (Signed)
Pt was seen in the ED last night and told to follow up with you today. I called pt pain is better today, but still significant. I advised pt on motrin to help with inflammation. Pt is very concerned about blood in the urine, which started out pink and has since turned dark red. She also questions if there is a fragment of stone that still needs to be removed (she was told this last night in the ED) Please advise. Thanks

## 2018-09-15 NOTE — Telephone Encounter (Signed)
Called pt informed her of the information below. Pt gave verbal understanding. Pt states that she is in "quite a bit of pain" currently and had to take her pain meds before the next scheduled dose. Reiterated the need to NSAIDS to help with breakthrough pain. Pt voiced understanding.

## 2018-09-18 ENCOUNTER — Telehealth: Payer: Self-pay | Admitting: Urology

## 2018-09-18 DIAGNOSIS — N200 Calculus of kidney: Secondary | ICD-10-CM

## 2018-09-18 NOTE — Telephone Encounter (Signed)
I need an order for a kub please  thanks

## 2018-09-21 ENCOUNTER — Ambulatory Visit
Admission: RE | Admit: 2018-09-21 | Discharge: 2018-09-21 | Disposition: A | Discharge status: Home / Self Care | Payer: BC Managed Care – PPO | Source: Ambulatory Visit | Attending: Urology | Admitting: Urology

## 2018-09-21 DIAGNOSIS — N200 Calculus of kidney: Secondary | ICD-10-CM | POA: Diagnosis present

## 2018-09-22 ENCOUNTER — Other Ambulatory Visit: Payer: Self-pay | Admitting: Urology

## 2018-09-22 ENCOUNTER — Other Ambulatory Visit: Payer: BC Managed Care – PPO | Admitting: Urology

## 2018-09-22 MED ORDER — OXYBUTYNIN CHLORIDE 5 MG PO TABS: ORAL_TABLET | ORAL | 0 refills | Status: DC

## 2018-09-22 MED ORDER — TAMSULOSIN HCL 0.4 MG PO CAPS: 0.4000 mg | ORAL_CAPSULE | Freq: Every day | ORAL | 0 refills | Status: DC

## 2018-09-26 ENCOUNTER — Telehealth: Payer: Self-pay | Admitting: Urology

## 2018-09-26 ENCOUNTER — Other Ambulatory Visit: Payer: BC Managed Care – PPO

## 2018-09-26 DIAGNOSIS — N200 Calculus of kidney: Secondary | ICD-10-CM

## 2018-09-26 LAB — CALCULI, WITH PHOTOGRAPH (CLINICAL LAB)
Calcium Oxalate Dihydrate: 20 %
Calcium Oxalate Monohydrate: 75 %
Hydroxyapatite: 5 %
Weight Calculi: 163 mg

## 2018-09-26 NOTE — Telephone Encounter (Signed)
Pt. States she passed multiple fragments last night.  She wants to know if she should just have an x-ray done and stent removal in office instead of going to surgery on Friday? 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 She said it is ok to leave a detailed message on [phone]. She will not be available until after 2:30.

## 2018-09-27 ENCOUNTER — Ambulatory Visit
Admission: RE | Admit: 2018-09-27 | Discharge: 2018-09-27 | Disposition: A | Discharge status: Home / Self Care | Payer: BC Managed Care – PPO | Source: Ambulatory Visit | Attending: Urology | Admitting: Urology

## 2018-09-27 DIAGNOSIS — N200 Calculus of kidney: Secondary | ICD-10-CM | POA: Diagnosis present

## 2018-09-28 ENCOUNTER — Telehealth: Payer: Self-pay | Admitting: Family Medicine

## 2018-09-28 NOTE — Telephone Encounter (Signed)
-----   Message from Abbie Sons, MD sent at 09/28/2018  1:42 PM EDT ----- Her x-ray looks much better and do not see the stone fragments that were present last week.  Will cancel her procedure tomorrow.

## 2018-09-28 NOTE — Telephone Encounter (Signed)
Patient notified

## 2018-09-29 ENCOUNTER — Encounter: Admission: RE | Payer: Self-pay | Source: Home / Self Care

## 2018-09-29 ENCOUNTER — Ambulatory Visit: Admission: RE | Admit: 2018-09-29 | Payer: BC Managed Care – PPO | Source: Home / Self Care | Admitting: Urology

## 2018-09-29 SURGERY — CYSTOSCOPY/URETEROSCOPY/HOLMIUM LASER/STENT PLACEMENT
Anesthesia: Choice | Laterality: Right

## 2018-10-03 ENCOUNTER — Other Ambulatory Visit: Payer: Self-pay

## 2018-10-03 ENCOUNTER — Ambulatory Visit (INDEPENDENT_AMBULATORY_CARE_PROVIDER_SITE_OTHER): Payer: BC Managed Care – PPO | Admitting: Urology

## 2018-10-03 VITALS — BP 113/77 | HR 81 | Ht 64.0 in | Wt 165.0 lb

## 2018-10-03 DIAGNOSIS — N200 Calculus of kidney: Secondary | ICD-10-CM | POA: Diagnosis not present

## 2018-10-03 LAB — URINALYSIS, COMPLETE
Bilirubin, UA: NEGATIVE
Glucose, UA: NEGATIVE
Ketones, UA: NEGATIVE
Nitrite, UA: NEGATIVE
Specific Gravity, UA: 1.015 (ref 1.005–1.030)
Urobilinogen, Ur: 0.2 mg/dL (ref 0.2–1.0)
pH, UA: 7 (ref 5.0–7.5)

## 2018-10-03 LAB — MICROSCOPIC EXAMINATION: Bacteria, UA: NONE SEEN

## 2018-10-03 MED ORDER — LIDOCAINE HCL URETHRAL/MUCOSAL 2 % EX GEL: 1.0000 "application " | Freq: Once | CUTANEOUS | Status: DC

## 2018-10-03 MED ORDER — CIPROFLOXACIN HCL 500 MG PO TABS: 500.0000 mg | ORAL_TABLET | Freq: Once | ORAL | Status: DC

## 2018-10-04 NOTE — Progress Notes (Signed)
Indications: Patient is 53 y.o., status post right ureteroscopic stone removal on 09/13/2018.  She did have pain postop and was seen in the ED on 8/6.  CT was performed which showed the stent in good position.  There were multiple stone fragments in the mid ureter alongside the stent.  A follow-up KUB demonstrated persistent fragments and she was scheduled for follow-up ureteroscopy however several days later she passed multiple small fragments and follow-up KUB showed no remaining calculi.  The patient is presenting today for stent removal.  Procedure:  Flexible Cystoscopy with stent removal OK:[id number])  Timeout was performed and the correct patient, procedure and participants were identified.    Description:  The patient was prepped and draped in the usual sterile fashion. Flexible cystosopy was performed.  The stent was visualized, grasped, and removed intact without difficulty. The patient tolerated the procedure well.  A single dose of oral antibiotics was given.  Complications:  None  Plan: Schedule follow-up renal ultrasound/KUB 6 weeks

## 2018-10-09 ENCOUNTER — Encounter: Payer: Self-pay | Admitting: Urology

## 2019-05-24 ENCOUNTER — Other Ambulatory Visit: Payer: Self-pay | Admitting: Physician Assistant

## 2019-05-24 DIAGNOSIS — Z1231 Encounter for screening mammogram for malignant neoplasm of breast: Secondary | ICD-10-CM

## 2019-06-18 ENCOUNTER — Ambulatory Visit
Admission: RE | Admit: 2019-06-18 | Discharge: 2019-06-18 | Disposition: A | Discharge status: Home / Self Care | Payer: BC Managed Care – PPO | Source: Ambulatory Visit | Attending: Physician Assistant | Admitting: Physician Assistant

## 2019-06-18 DIAGNOSIS — Z1231 Encounter for screening mammogram for malignant neoplasm of breast: Secondary | ICD-10-CM

## 2019-06-21 ENCOUNTER — Other Ambulatory Visit: Payer: Self-pay | Admitting: Physician Assistant

## 2019-06-21 DIAGNOSIS — R928 Other abnormal and inconclusive findings on diagnostic imaging of breast: Secondary | ICD-10-CM

## 2019-06-21 DIAGNOSIS — R921 Mammographic calcification found on diagnostic imaging of breast: Secondary | ICD-10-CM

## 2019-07-05 ENCOUNTER — Ambulatory Visit
Admission: RE | Admit: 2019-07-05 | Discharge: 2019-07-05 | Disposition: A | Discharge status: Home / Self Care | Payer: BC Managed Care – PPO | Source: Ambulatory Visit | Attending: Physician Assistant | Admitting: Physician Assistant

## 2019-07-05 DIAGNOSIS — R928 Other abnormal and inconclusive findings on diagnostic imaging of breast: Secondary | ICD-10-CM | POA: Diagnosis present

## 2019-07-05 DIAGNOSIS — R921 Mammographic calcification found on diagnostic imaging of breast: Secondary | ICD-10-CM | POA: Diagnosis present

## 2019-07-10 ENCOUNTER — Other Ambulatory Visit: Payer: Self-pay | Admitting: Physician Assistant

## 2019-07-10 DIAGNOSIS — R928 Other abnormal and inconclusive findings on diagnostic imaging of breast: Secondary | ICD-10-CM

## 2019-07-10 DIAGNOSIS — R921 Mammographic calcification found on diagnostic imaging of breast: Secondary | ICD-10-CM

## 2019-07-13 ENCOUNTER — Ambulatory Visit
Admission: RE | Admit: 2019-07-13 | Discharge: 2019-07-13 | Disposition: A | Discharge status: Home / Self Care | Payer: BC Managed Care – PPO | Source: Ambulatory Visit | Attending: Physician Assistant | Admitting: Physician Assistant

## 2019-07-13 DIAGNOSIS — R928 Other abnormal and inconclusive findings on diagnostic imaging of breast: Secondary | ICD-10-CM

## 2019-07-13 DIAGNOSIS — R921 Mammographic calcification found on diagnostic imaging of breast: Secondary | ICD-10-CM

## 2019-07-13 HISTORY — PX: BREAST BIOPSY: SHX20

## 2019-07-19 ENCOUNTER — Other Ambulatory Visit: Payer: Self-pay

## 2019-07-20 NOTE — Progress Notes (Signed)
Navigation initiated .  Patient has discussed her biopsy results with Boykin Reaper, her provider.  Referrals made with Dr. Windell Moment , and Dr. Janese Banks.  Reviewed options for second opinion per patient wishes.

## 2019-07-23 ENCOUNTER — Other Ambulatory Visit: Payer: Self-pay

## 2019-07-23 ENCOUNTER — Ambulatory Visit: Payer: BC Managed Care – PPO | Admitting: Plastic Surgery

## 2019-07-23 ENCOUNTER — Encounter: Payer: Self-pay | Admitting: Plastic Surgery

## 2019-07-23 ENCOUNTER — Other Ambulatory Visit: Payer: Self-pay | Admitting: General Surgery

## 2019-07-23 VITALS — BP 119/74 | HR 79 | Temp 98.2°F | Ht 64.0 in | Wt 147.4 lb

## 2019-07-23 DIAGNOSIS — G43909 Migraine, unspecified, not intractable, without status migrainosus: Secondary | ICD-10-CM | POA: Diagnosis not present

## 2019-07-23 DIAGNOSIS — F411 Generalized anxiety disorder: Secondary | ICD-10-CM

## 2019-07-23 DIAGNOSIS — C50211 Malignant neoplasm of upper-inner quadrant of right female breast: Secondary | ICD-10-CM

## 2019-07-23 DIAGNOSIS — C50919 Malignant neoplasm of unspecified site of unspecified female breast: Secondary | ICD-10-CM | POA: Diagnosis not present

## 2019-07-23 NOTE — Progress Notes (Addendum)
Patient ID: Tiffany Acevedo, female    DOB: [birth year], 54 y.o.   MRN: [id number]   Chief Complaint  Patient presents with  . Advice Only  . Breast Cancer    The patient is a 54 year old female here with her sister and her husband for consultation for breast reconstruction.  Up on her screening mammogram calcifications were noted in the right breast which led to diagnostic mammogram and biopsies.  She is planning on having an MRI this week.  The biopsy was negative for invasive mammary carcinoma.   She is 5 feet 4 inches.  She weighs 147 pounds.  Her preoperative bra is a 34C.  her last mammogram was 07/09/18 and the biopsy of the right breast was 07/13/19.  She does not use tobacco.  She has had a bladder tack, C-section and kidney stones.  She does not have a family history of breast cancer.  Radiation is not planned provided the lymph nodes are negative.  She has 4 kids ranging from 8-27 yrs.  She is leaning towards a right mastectomy.  She is thinking about whether or not to do anything on the left side.   Review of Systems  Constitutional: Negative.  Negative for activity change.  HENT: Negative.   Eyes: Negative.   Respiratory: Negative.  Negative for chest tightness and shortness of breath.   Cardiovascular: Negative.   Gastrointestinal: Negative.   Endocrine: Negative.   Genitourinary: Negative.   Musculoskeletal: Negative.   Skin: Positive for color change. Negative for wound.  Hematological: Negative.   Psychiatric/Behavioral: Negative.     Past Medical History:  Diagnosis Date  . Allergy   . Anxiety   . Arthritis   . Depression   . Excessive weight gain 06/30/2016  . Frequent headaches   . GERD (gastroesophageal reflux disease)   . History of kidney stones   . Hypertension   . Migraines   . Pre-diabetes     Past Surgical History:  Procedure Laterality Date  . BREAST BIOPSY Right 07/13/2019   stereo biopsy/ x clip/path pending  . BREAST BIOPSY Right  07/13/2019   stereo biopsy/coil clip/ path pending  . [place], 2004, 2013  . COLONOSCOPY  2015  . CYSTOSCOPY W/ RETROGRADES Right 09/13/2018   Procedure: CYSTOSCOPY WITH RETROGRADE PYELOGRAM;  Surgeon: Abbie Sons, MD;  Location: [facility] ORS;  Service: Urology;  Laterality: Right;  . CYSTOSCOPY/URETEROSCOPY/HOLMIUM LASER/STENT PLACEMENT Left 05/09/2018   Procedure: CYSTOSCOPY/URETEROSCOPY/HOLMIUM LASER/STENT PLACEMENT;  Surgeon: Abbie Sons, MD;  Location: [facility] OR[facility];  Service: Urology;  Laterality: Left;  . CYSTOSCOPY/URETEROSCOPY/HOLMIUM LASER/STENT PLACEMENT Right 09/13/2018   Procedure: CYSTOSCOPY/URETEROSCOPY/HOLMIUM LASER/STENT PLACEMENT;  Surgeon: Abbie Sons, MD;  Location: [facility] OR[facility];  Service: Urology;  Laterality: Right;  . INCONTINENCE SURGERY     BLADDER TUCK  . PILONIDAL CYST EXCISION    . STONE EXTRACTION WITH BASKET Right 09/13/2018   Procedure: STONE EXTRACTION WITH BASKET;  Surgeon: Abbie Sons, MD;  Location: [facility] OR[facility];  Service: Urology;  Laterality: Right;  . UPPER GI ENDOSCOPY        Current Outpatient Medications:  .  EMGALITY 120 MG/ML SOAJ, Inject 120 mg into the skin every 28 (twenty-eight) days., Disp: , Rfl:  .  hydrochlorothiazide (MICROZIDE) 12.5 MG capsule, Take 1 capsule (12.5 mg total) by mouth daily., Disp: 30 capsule, Rfl: 2 .  ibuprofen (ADVIL,MOTRIN) 200 MG tablet, Take 400 mg by mouth every 6 (six) hours as needed for headache or moderate  pain. , Disp: , Rfl:  .  imipramine (TOFRANIL) 50 MG tablet, Take 50 mg by mouth at bedtime. , Disp: , Rfl:  .  metoprolol succinate (TOPROL-XL) 100 MG 24 hr tablet, Take 100 mg by mouth at bedtime. , Disp: , Rfl:  .  Multiple Vitamin (MULTIVITAMIN PO), Take 1 tablet by mouth daily., Disp: , Rfl:  .  omeprazole (PRILOSEC) 20 MG capsule, Take 20 mg by mouth daily. , Disp: , Rfl:  .  ondansetron (ZOFRAN) 4 MG tablet, Take 1 tablet (4 mg total) by mouth every 8 (eight) hours as needed for nausea or  vomiting., Disp: 8 tablet, Rfl: 0 .  rizatriptan (MAXALT) 10 MG tablet, Take 10 mg by mouth as needed for migraine. , Disp: , Rfl:  .  venlafaxine XR (EFFEXOR-XR) 150 MG 24 hr capsule, TAKE 1 CAPSULE BY MOUTH EVERY DAY WITH BREAKFAST (Patient taking differently: Take 150 mg by mouth daily with breakfast. ), Disp: 30 capsule, Rfl: 5  Current Facility-Administered Medications:  .  ciprofloxacin (CIPRO) tablet 500 mg, 500 mg, Oral, Once, Stoioff, Scott C, MD .  lidocaine (XYLOCAINE) 2 % jelly 1 application, 1 application, Urethral, Once, Stoioff, Scott C, MD   Objective:   Vitals:   07/23/19 1528  BP: 119/74  Pulse: 79  Temp: 98.2 F (36.8 C)  SpO2: 100%    Physical Exam Vitals and nursing note reviewed.  Constitutional:      Appearance: Normal appearance.  HENT:     Head: Normocephalic and atraumatic.  Cardiovascular:     Rate and Rhythm: Normal rate.     Pulses: Normal pulses.  Pulmonary:     Effort: Pulmonary effort is normal.  Abdominal:     General: Abdomen is flat. There is no distension.     Tenderness: There is no abdominal tenderness.  Skin:    General: Skin is warm.     Capillary Refill: Capillary refill takes less than 2 seconds.  Neurological:     General: No focal deficit present.     Mental Status: She is alert and oriented to person, place, and time.  Psychiatric:        Mood and Affect: Mood normal.        Behavior: Behavior normal.        Thought Content: Thought content normal.     Assessment & Plan:  Generalized anxiety disorder  Migraine syndrome  Malignant neoplasm of female breast, unspecified estrogen receptor status, unspecified laterality, unspecified site of breast ([place])  We had a detailed conversation about the patient's options for breast reconstruction. Several reconstruction options were explained to the patient.  It is important to remember that breast reconstruction is an optional procedure. Reconstruction often requires several  stages of surgery and this means more than one operation.  The surgeries are often done several months apart.  The entire process from start to finish can take a year or more. The major goal of breast reconstruction is to look normal in clothing. There will always be scars and a difference noticeable without clothes.  This is true for asymmetries where both breasts will not be identical.  Surgery may be needed or desired to the non-cancerous breast in order to achieve better symmetry and satisfactory results.  Regardless of the reconstructive method, there is always risks and the possibility that the procedure will fail or have complications.  This couls required additional surgeries.    We discussed the available methods of breast reconstruction and included:  1.  Tissue expander with Acellular dermal matrix followed by implant based reconstruction. This can be done as one surgery or multiple surgeries.  2. Autologous reconstruction can include using a muscle or tissue from another area of the body for the reconstruction.  3. Combined procedures like the latissismus dorsi flaps that often uses the muscle with an expander or implant.  For each of the method discussed the risks, benefits, scars and recovery time were discussed in detail. Specific risks included bleeding, infection, hematoma, seroma, scarring, pain, wound healing complications, flap loss, fat necrosis, capsular contracture, need for implant removal, donor site complications, bulge, hernia, umbilical necrosis, need for urgent reoperation, and need for dressing changes.   After the options were discussed we focused on the patient's desires and the procedure that was best for her based on all the information.  A total of 45 minutes of face-to-face time was spent in this encounter, of which >70% was spent in counseling.    The patient is most like a lady going to go with a right mastectomy with immediate reconstruction with expander and Flex HD  placement.  Then at the time of her exchange a left breast mastopexy.  Pending the MRI of the negative on the left side.  Pictures were obtained of the patient and placed in the chart with the patient's or guardian's permission.  I have personally reviewed the notes from Dr. Deniece Ree office and spoke to him on the phone about this patient.  I will send the patient to [facility] for postoperative supplies.  She will need sports bras and postmastectomy Cami's and garments. [place], DO

## 2019-07-24 ENCOUNTER — Inpatient Hospital Stay: Payer: BC Managed Care – PPO

## 2019-07-24 ENCOUNTER — Inpatient Hospital Stay: Payer: BC Managed Care – PPO | Admitting: Oncology

## 2019-07-24 LAB — SURGICAL PATHOLOGY

## 2019-07-24 NOTE — Progress Notes (Signed)
Discussed appointments with surgeon/plastic surgeon.  Rescheduled Med/Onc visit to 07/27/19 since Molecular study results are not back.

## 2019-07-25 ENCOUNTER — Ambulatory Visit
Admission: RE | Admit: 2019-07-25 | Discharge: 2019-07-25 | Disposition: A | Discharge status: Home / Self Care | Payer: BC Managed Care – PPO | Source: Ambulatory Visit | Attending: General Surgery | Admitting: General Surgery

## 2019-07-25 ENCOUNTER — Other Ambulatory Visit: Payer: Self-pay

## 2019-07-25 DIAGNOSIS — C50919 Malignant neoplasm of unspecified site of unspecified female breast: Secondary | ICD-10-CM | POA: Insufficient documentation

## 2019-07-25 DIAGNOSIS — C50211 Malignant neoplasm of upper-inner quadrant of right female breast: Secondary | ICD-10-CM | POA: Insufficient documentation

## 2019-07-25 MED ORDER — GADOBUTROL 1 MMOL/ML IV SOLN
6.0000 mL | Freq: Once | INTRAVENOUS | Status: AC | PRN
  Administered 2019-07-25: 6 mL via INTRAVENOUS

## 2019-07-26 ENCOUNTER — Inpatient Hospital Stay: Payer: BC Managed Care – PPO

## 2019-07-26 ENCOUNTER — Ambulatory Visit: Payer: BC Managed Care – PPO | Admitting: Oncology

## 2019-07-26 ENCOUNTER — Inpatient Hospital Stay: Payer: BC Managed Care – PPO | Attending: Oncology | Admitting: Oncology

## 2019-07-26 ENCOUNTER — Other Ambulatory Visit: Payer: BC Managed Care – PPO

## 2019-07-26 ENCOUNTER — Inpatient Hospital Stay (HOSPITAL_BASED_OUTPATIENT_CLINIC_OR_DEPARTMENT_OTHER): Payer: BC Managed Care – PPO | Admitting: Licensed Clinical Social Worker

## 2019-07-26 ENCOUNTER — Encounter: Payer: Self-pay | Admitting: Licensed Clinical Social Worker

## 2019-07-26 VITALS — BP 115/67 | HR 72 | Temp 97.5°F | Resp 20 | Wt 147.0 lb

## 2019-07-26 DIAGNOSIS — Z806 Family history of leukemia: Secondary | ICD-10-CM

## 2019-07-26 DIAGNOSIS — Z7189 Other specified counseling: Secondary | ICD-10-CM | POA: Diagnosis not present

## 2019-07-26 DIAGNOSIS — Z800 Family history of malignant neoplasm of digestive organs: Secondary | ICD-10-CM

## 2019-07-26 DIAGNOSIS — C50911 Malignant neoplasm of unspecified site of right female breast: Secondary | ICD-10-CM | POA: Diagnosis not present

## 2019-07-26 DIAGNOSIS — C50411 Malignant neoplasm of upper-outer quadrant of right female breast: Secondary | ICD-10-CM

## 2019-07-26 DIAGNOSIS — Z170 Estrogen receptor positive status [ER+]: Secondary | ICD-10-CM | POA: Diagnosis not present

## 2019-07-26 DIAGNOSIS — F419 Anxiety disorder, unspecified: Secondary | ICD-10-CM

## 2019-07-26 DIAGNOSIS — C50919 Malignant neoplasm of unspecified site of unspecified female breast: Secondary | ICD-10-CM

## 2019-07-26 DIAGNOSIS — Z8042 Family history of malignant neoplasm of prostate: Secondary | ICD-10-CM

## 2019-07-26 DIAGNOSIS — Z315 Encounter for genetic counseling: Secondary | ICD-10-CM

## 2019-07-26 DIAGNOSIS — Z809 Family history of malignant neoplasm, unspecified: Secondary | ICD-10-CM

## 2019-07-26 NOTE — Progress Notes (Addendum)
REFERRING PROVIDER: Sindy Guadeloupe, MD [place],  [place] [id number]  PRIMARY PROVIDER:  Marinda Elk, MD  PRIMARY REASON FOR VISIT:  1. Malignant neoplasm of female breast, unspecified estrogen receptor status, unspecified laterality, unspecified site of breast ([place])   2. Family history of prostate cancer   3. Family history of colon cancer      HISTORY OF PRESENT ILLNESS:   Tiffany Acevedo, a 54 y.o. female, was seen for a [place] cancer genetics consultation at the request of Dr. Janese Banks due to a personal and family history of cancer.  Tiffany Acevedo presents to clinic today to discuss the possibility of a hereditary predisposition to cancer, genetic testing, and to further clarify her future cancer risks, as well as potential cancer risks for family members.   In 2021, at the age of 60, Tiffany Acevedo was diagnosed with invasive mammary carcinoma of the right breast, ER/PR+, Her2-. She is leaning towards having a right mastectomy, results of genetic testing may impact her surgical decision.   CANCER HISTORY:  Oncology History   No history exists.     RISK FACTORS:  Menarche was at age 74.  First live birth at age 62. OCP use: yes Ovaries intact: yes.  Hysterectomy: no.  HRT use: 0 years. Colonoscopy: yes; normal. Mammogram within the last year: yes. Number of breast biopsies: 1. Up to date with pelvic exams: yes. Any excessive radiation exposure in the past: no  Past Medical History:  Diagnosis Date  . Allergy   . Anxiety   . Arthritis   . Depression   . Excessive weight gain 06/30/2016  . Family history of colon cancer   . Family history of colon cancer   . Family history of prostate cancer   . Frequent headaches   . GERD (gastroesophageal reflux disease)   . History of kidney stones   . Hypertension   . Migraines   . Pre-diabetes     Past Surgical History:  Procedure Laterality Date  . BREAST BIOPSY Right 07/13/2019   stereo biopsy/ x clip/path  pending  . BREAST BIOPSY Right 07/13/2019   stereo biopsy/coil clip/ path pending  . [place], 2004, 2013  . COLONOSCOPY  2015  . CYSTOSCOPY W/ RETROGRADES Right 09/13/2018   Procedure: CYSTOSCOPY WITH RETROGRADE PYELOGRAM;  Surgeon: Abbie Sons, MD;  Location: [facility] OR[facility];  Service: Urology;  Laterality: Right;  . CYSTOSCOPY/URETEROSCOPY/HOLMIUM LASER/STENT PLACEMENT Left 05/09/2018   Procedure: CYSTOSCOPY/URETEROSCOPY/HOLMIUM LASER/STENT PLACEMENT;  Surgeon: Abbie Sons, MD;  Location: [facility] OR[facility];  Service: Urology;  Laterality: Left;  . CYSTOSCOPY/URETEROSCOPY/HOLMIUM LASER/STENT PLACEMENT Right 09/13/2018   Procedure: CYSTOSCOPY/URETEROSCOPY/HOLMIUM LASER/STENT PLACEMENT;  Surgeon: Abbie Sons, MD;  Location: [facility] OR[facility];  Service: Urology;  Laterality: Right;  . INCONTINENCE SURGERY     BLADDER TUCK  . PILONIDAL CYST EXCISION    . STONE EXTRACTION WITH BASKET Right 09/13/2018   Procedure: STONE EXTRACTION WITH BASKET;  Surgeon: Abbie Sons, MD;  Location: [facility] OR[facility];  Service: Urology;  Laterality: Right;  . UPPER GI ENDOSCOPY      Social History   Socioeconomic History  . Marital status: Married    Spouse name: Not on file  . Number of children: Not on file  . Years of education: Not on file  . Highest education level: Not on file  Occupational History  . Not on file  Tobacco Use  . Smoking status: Never Smoker  . Smokeless tobacco: Never Used  Vaping  Use  . Vaping Use: Never used  Substance and Sexual Activity  . Alcohol use: Yes    Comment: glass of wine occassionally  . Drug use: No  . Sexual activity: Yes    Partners: Male  Other Topics Concern  . Not on file  Social History Narrative  . Not on file   Social Determinants of Health   Financial Resource Strain:   . Difficulty of Paying Living Expenses:   Food Insecurity:   . Worried About Charity fundraiser in the Last Year:   . Arboriculturist in the Last Year:   Transportation Needs:    . Film/video editor (Medical):   Marland Kitchen Lack of Transportation (Non-Medical):   Physical Activity:   . Days of Exercise per Week:   . Minutes of Exercise per Session:   Stress:   . Feeling of Stress :   Social Connections:   . Frequency of Communication with Friends and Family:   . Frequency of Social Gatherings with Friends and Family:   . Attends Religious Services:   . Active Member of Clubs or Organizations:   . Attends Archivist Meetings:   Marland Kitchen Marital Status:      FAMILY HISTORY:  We obtained a detailed, 4-generation family history.  Significant diagnoses are listed below: Family History  Problem Relation Age of Onset  . Hyperlipidemia Mother   . Hypertension Mother   . Depression Mother   . Colon cancer Mother 22  . Alcohol abuse Father   . Hyperlipidemia Father   . Stroke Father   . Hypertension Father   . Depression Father   . Heart disease Father   . Prostate cancer Father 60  . Arthritis Maternal Grandmother   . Hypertension Maternal Grandmother   . Depression Maternal Grandmother   . Arthritis Maternal Grandfather   . Stroke Maternal Grandfather   . Hypertension Maternal Grandfather   . Depression Maternal Grandfather   . Hypertension Paternal Grandmother   . Depression Paternal Grandmother   . Hypertension Paternal Grandfather   . Depression Paternal Grandfather   . Leukemia Maternal Aunt        d <50  . Cancer Paternal Aunt        unsure types   Tiffany Acevedo has 3 daughters and 1 son. She has 2 sisters and a brother, no cancers.  Tiffany Acevedo mother was diagnosed with colon cancer at 39. She has had skin cancers recently and is living at 95. Patient had 3 maternal aunts, 2 maternal uncles. One aunt had leukemia and died under the age of 14. No known cancers in maternal cousins but patient has limited information as they are out of state. Maternal grandmother died at 53, grandfather died at 73. Update 08/05/2019: Patient reports one maternal uncle  had cancer, unknown type.   Tiffany Acevedo father was diagnosed with prostate cancer at 15 and died at 60 due to heart issues. Patient had 1 paternal uncle, 3 paternal aunts. Two aunts did have cancer over the age of 32 but unsure the type. No known cancers in paternal cousins. Paternal grandmother and grandfather both died over the age of 25.  Update 2019-08-05: Patient reports one of her aunts had metastatic breast cancer to the pancreas, and one had leukemia.   Tiffany Acevedo is unaware of previous family history of genetic testing for hereditary cancer risks. Patient's maternal ancestors are of unknown descent, and paternal ancestors are of [place] descent. There is  no reported Ashkenazi Jewish ancestry. There is no known consanguinity.  GENETIC COUNSELING ASSESSMENT: Tiffany Acevedo is a 54 y.o. female with a personal and family history which is somewhat suggestive of a hereditary cancer syndrome and predisposition to cancer. We, therefore, discussed and recommended the following at today's visit.   DISCUSSION: We discussed that 5 - 10% of breast cancer is hereditary, with most cases associated with BRCA1/BRCA2 mutations.  There are other genes that can be associated with hereditary breast cancer syndromes.  We discussed that testing is beneficial for several reasons including surgical decision-making for breast cancer, knowing how to follow individuals after completing their treatment, and understand if other family members could be at risk for cancer and allow them to undergo genetic testing.   We reviewed the characteristics, features and inheritance patterns of hereditary cancer syndromes. We also discussed genetic testing, including the appropriate family members to test, the process of testing, insurance coverage and turn-around-time for results. We discussed the implications of a negative, positive and/or variant of uncertain significant result. In order to get genetic test results in a timely manner so  that Tiffany Acevedo can use these genetic test results for surgical decisions, we recommended Tiffany Acevedo pursue genetic testing for the [facility] Breast Cancer STAT Panel. Once complete, we recommend Tiffany Acevedo pursue reflex genetic testing to the Common Hereditary Cancers gene panel.   The STAT Breast cancer panel offered by [facility] includes sequencing and rearrangement analysis for the following 9 genes:  ATM, BRCA1, BRCA2, CDH1, CHEK2, PALB2, PTEN, STK11 and TP53.    The Common Hereditary Cancers Panel offered by [facility] includes sequencing and/or deletion duplication testing of the following 48 genes: APC, ATM, AXIN2, BARD1, BMPR1A, BRCA1, BRCA2, BRIP1, CDH1, CDKN2A (p14ARF), CDKN2A (p16INK4a), CKD4, CHEK2, CTNNA1, DICER1, EPCAM (Deletion/duplication testing only), GREM1 (promoter region deletion/duplication testing only), KIT, MEN1, MLH1, MSH2, MSH3, MSH6, MUTYH, NBN, NF1, NHTL1, PALB2, PDGFRA, PMS2, POLD1, POLE, PTEN, RAD50, RAD51C, RAD51D, RNF43, SDHB, SDHC, SDHD, SMAD4, SMARCA4. STK11, TP53, TSC1, TSC2, and VHL.  The following genes were evaluated for sequence changes only: SDHA and HOXB13 c.251G>A variant only.  Based on Tiffany Acevedo's personal and family history of cancer, she does not quite meet medical criteria for genetic testing. Despite that she meets criteria, she may still have an out of pocket cost.   PLAN: After considering the risks, benefits, and limitations, Tiffany Acevedo provided informed consent to pursue genetic testing and the blood sample was sent to [facility] for analysis of the Breast Cancer STAT Panel + Common Hereditary Cancers Panel. Results should be available within approximately 1 weeks' time, at which point they will be disclosed by telephone to Tiffany Acevedo, as will any additional recommendations warranted by these results. Tiffany Acevedo will receive a summary of her genetic counseling visit and a copy of her results once available. This information will also be available in Epic.    Tiffany Acevedo questions were answered to her satisfaction today. Our contact information was provided should additional questions or concerns arise. Thank you for the referral and allowing [place] to share in the care of your patient.   Faith Rogue, MS, [facility] Genetic Counselor [email or web address] Phone: [phone]  The patient was seen for a total of 30 minutes in face-to-face genetic counseling. Patient's husband was also present.  Drs. Magrinat, Lindi Adie and/or Burr Medico were available for discussion regarding this case.   _______________________________________________________________________ For Office Staff:  Number of people involved in session: 2 Was an Intern/ student involved with case: No

## 2019-07-26 NOTE — Progress Notes (Signed)
Patient here today for new evaluation regarding breast cancer.  

## 2019-07-27 ENCOUNTER — Inpatient Hospital Stay: Payer: BC Managed Care – PPO | Admitting: Oncology

## 2019-07-27 ENCOUNTER — Other Ambulatory Visit: Payer: Self-pay | Admitting: General Surgery

## 2019-07-27 ENCOUNTER — Inpatient Hospital Stay: Payer: BC Managed Care – PPO

## 2019-07-27 DIAGNOSIS — C50211 Malignant neoplasm of upper-inner quadrant of right female breast: Secondary | ICD-10-CM

## 2019-07-30 ENCOUNTER — Encounter: Payer: Self-pay | Admitting: Oncology

## 2019-07-30 DIAGNOSIS — Z7189 Other specified counseling: Secondary | ICD-10-CM | POA: Insufficient documentation

## 2019-07-30 DIAGNOSIS — C50411 Malignant neoplasm of upper-outer quadrant of right female breast: Secondary | ICD-10-CM | POA: Insufficient documentation

## 2019-07-30 DIAGNOSIS — Z170 Estrogen receptor positive status [ER+]: Secondary | ICD-10-CM | POA: Insufficient documentation

## 2019-07-30 NOTE — Progress Notes (Signed)
Hematology/Oncology Consult note [facility] Telephone:([phone] [phone]  Patient Care Team: Marinda Elk, MD as PCP - General (Physician Assistant) Theodore Demark, RN as Oncology Nurse Navigator   Name of the patient: Tiffany Acevedo  [id number]  July 19, 1965    Reason for referral-new diagnosis of breast cancer   Referring physician-Dr. Carrie Mew  Date of visit: 07/30/19   History of presenting illness- Patient is a 54 year old female with past medical history is significant for anxiety disorder who recently underwent a screening mammogram on 06/18/2019 which showed suspicious calcifications in the right breast.  This was followed by diagnostic mammogram which showed numerous groups of calcifications spanning at least 6.4 cm.  She had a biopsy of the medialmost and lateralmost calcification.  The medialmost calcification showed invasive mammary carcinoma grade 1 ER/PR positive and HER-2 negative.  The lateralmost calcification was negative for atypia and malignancy and showed fibrocystic changes.  Patient has met with Dr. Peyton Najjar and is leaning towards at least a unilateral mastectomy.  She also underwent bilateral MRI which did not show any enhancement even at the site of biopsy-proven malignancy or elsewhere.  No MRI evidence of malignancy in the left breast.  Menarche at the age of 8.  She is still premenopausal and gets her menstrual cycles irregularly.  No prior use of hormone replacement therapy.  She has used birth control remotely in the past.  Family history significant for mother with colorectal cancer and father with prostate cancer.  ECOG PS- 0  Pain scale- 0   Review of systems- Review of Systems  Constitutional: Negative for chills, fever, malaise/fatigue and weight loss.  HENT: Negative for congestion, ear discharge and nosebleeds.   Eyes: Negative for blurred vision.  Respiratory: Negative for cough, hemoptysis, sputum  production, shortness of breath and wheezing.   Cardiovascular: Negative for chest pain, palpitations, orthopnea and claudication.  Gastrointestinal: Negative for abdominal pain, blood in stool, constipation, diarrhea, heartburn, melena, nausea and vomiting.  Genitourinary: Negative for dysuria, flank pain, frequency, hematuria and urgency.  Musculoskeletal: Negative for back pain, joint pain and myalgias.  Skin: Negative for rash.  Neurological: Negative for dizziness, tingling, focal weakness, seizures, weakness and headaches.  Endo/Heme/Allergies: Does not bruise/bleed easily.  Psychiatric/Behavioral: Negative for depression and suicidal ideas. The patient does not have insomnia.     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 01/22/2017   Overweight (BMI 25.0-29.9) 01/22/2017   Encounter for preventive health examination 51/83/3582   Renal colic 51/89/8421   Ureteral calculus 04/12/2015   Nephrolithiasis 03/04/2015   Intractable migraine with aura without status migrainosus 10/29/2013   Lack of libido 06/24/2013   Migraine syndrome 03/11/2013   Generalized anxiety disorder 03/11/2013     Past Medical History:  Diagnosis Date   Allergy    Anxiety    Arthritis    Depression    Excessive weight gain 06/30/2016   Family history of colon cancer    Family history of colon cancer    Family history of  prostate cancer    Frequent headaches    GERD (gastroesophageal reflux disease)    History of kidney stones     Hypertension    Migraines    Pre-diabetes      Past Surgical History:  Procedure Laterality Date   BREAST BIOPSY Right 07/13/2019   stereo biopsy/ x clip/path pending   BREAST BIOPSY Right 07/13/2019   stereo biopsy/coil clip/ path pending   [place], 2004, 2013   COLONOSCOPY  2015   CYSTOSCOPY W/ RETROGRADES Right 09/13/2018   Procedure: CYSTOSCOPY WITH RETROGRADE PYELOGRAM;  Surgeon: Abbie Sons, MD;  Location: [facility];  Service: Urology;  Laterality: Right;   CYSTOSCOPY/URETEROSCOPY/HOLMIUM LASER/STENT PLACEMENT Left 05/09/2018   Procedure: CYSTOSCOPY/URETEROSCOPY/HOLMIUM LASER/STENT PLACEMENT;  Surgeon: Abbie Sons, MD;  Location: [facility];  Service: Urology;  Laterality: Left;   CYSTOSCOPY/URETEROSCOPY/HOLMIUM LASER/STENT PLACEMENT Right 09/13/2018   Procedure: CYSTOSCOPY/URETEROSCOPY/HOLMIUM LASER/STENT PLACEMENT;  Surgeon: Abbie Sons, MD;  Location: [facility];  Service: Urology;  Laterality: Right;   INCONTINENCE SURGERY     BLADDER TUCK   PILONIDAL CYST EXCISION     STONE EXTRACTION WITH BASKET Right 09/13/2018   Procedure: STONE EXTRACTION WITH BASKET;  Surgeon: Abbie Sons, MD;  Location: [facility];  Service: Urology;  Laterality: Right;   UPPER GI ENDOSCOPY      Social History   Socioeconomic History   Marital status: Married    Spouse name: Not on file   Number of children: Not on file   Years of education: Not on file   Highest education level: Not on file  Occupational History   Not on file  Tobacco Use   Smoking status: Never Smoker   Smokeless tobacco: Never Used  Vaping Use   Vaping Use: Never used  Substance and Sexual Activity   Alcohol use: Yes    Comment: glass of wine occassionally   Drug use: No   Sexual activity: Yes    Partners: Male  Other Topics Concern   Not on file  Social History Narrative   Not on file   Social Determinants of Health   Financial Resource Strain:    Difficulty  of Paying Living Expenses:   Food Insecurity:    Worried About Charity fundraiser in the Last Year:    Arboriculturist in the Last Year:   Transportation Needs:    Film/video editor (Medical):    Lack of Transportation (Non-Medical):   Physical Activity:    Days of Exercise per Week:    Minutes of Exercise per Session:   Stress:    Feeling of Stress :   Social Connections:    Frequency of Communication with Friends and Family:    Frequency of Social Gatherings with Friends and Family:    Attends Religious Services:    Active Member of Clubs or Organizations:    Attends Music therapist:    Marital Status:   Intimate Partner Violence:    Fear of Current or Ex-Partner:    Emotionally Abused:    Physically Abused:    Sexually Abused:      Family History  Problem Relation Age of Onset   Hyperlipidemia Mother    Hypertension Mother    Depression Mother    Colon cancer Mother 60   Alcohol abuse Father    Hyperlipidemia Father    Stroke Father    Hypertension Father    Depression Father    Heart disease Father  Prostate cancer Father 57   Arthritis Maternal Grandmother    Hypertension Maternal Grandmother    Depression Maternal Grandmother    Arthritis Maternal Grandfather    Stroke Maternal Grandfather    Hypertension Maternal Grandfather    Depression Maternal Grandfather    Hypertension Paternal Grandmother    Depression Paternal Grandmother    Hypertension Paternal Grandfather    Depression Paternal Grandfather    Leukemia Maternal Aunt        d <50   Cancer Paternal Aunt        unsure types     Current Outpatient Medications:    EMGALITY 120 MG/ML SOAJ, Inject 120 mg into the skin every 28 (twenty-eight) days., Disp: , Rfl:    hydrochlorothiazide (MICROZIDE) 12.5 MG capsule, Take 1 capsule (12.5 mg total) by mouth daily., Disp: 30 capsule, Rfl: 2   ibuprofen (ADVIL,MOTRIN) 200 MG tablet, Take  400 mg by mouth every 6 (six) hours as needed for headache or moderate pain. , Disp: , Rfl:    imipramine (TOFRANIL) 50 MG tablet, Take 50 mg by mouth at bedtime. , Disp: , Rfl:    metoprolol succinate (TOPROL-XL) 100 MG 24 hr tablet, Take 100 mg by mouth at bedtime. , Disp: , Rfl:    Multiple Vitamin (MULTIVITAMIN PO), Take 1 tablet by mouth daily., Disp: , Rfl:    omeprazole (PRILOSEC) 20 MG capsule, Take 20 mg by mouth daily. , Disp: , Rfl:    ondansetron (ZOFRAN) 4 MG tablet, Take 1 tablet (4 mg total) by mouth every 8 (eight) hours as needed for nausea or vomiting., Disp: 8 tablet, Rfl: 0   rizatriptan (MAXALT) 10 MG tablet, Take 10 mg by mouth as needed for migraine. , Disp: , Rfl:    venlafaxine XR (EFFEXOR-XR) 150 MG 24 hr capsule, TAKE 1 CAPSULE BY MOUTH EVERY DAY WITH BREAKFAST (Patient taking differently: Take 150 mg by mouth daily with breakfast. ), Disp: 30 capsule, Rfl: 5  Current Facility-Administered Medications:    ciprofloxacin (CIPRO) tablet 500 mg, 500 mg, Oral, Once, Stoioff, Scott C, MD   lidocaine (XYLOCAINE) 2 % jelly 1 application, 1 application, Urethral, Once, Stoioff, Scott C, MD   Physical exam:  Vitals:   07/26/19 1427  BP: 115/67  Pulse: 72  Resp: 20  Temp: (!) 97.5 F (36.4 C)  TempSrc: Tympanic  Weight: 147 lb (66.7 kg)   Physical Exam Constitutional:      General: She is not in acute distress. Cardiovascular:     Rate and Rhythm: Normal rate and regular rhythm.     Heart sounds: Normal heart sounds.  Pulmonary:     Effort: Pulmonary effort is normal.     Breath sounds: Normal breath sounds.  Abdominal:     General: Bowel sounds are normal.     Palpations: Abdomen is soft.  Musculoskeletal:     Cervical back: Tenderness:    Skin:    General: Skin is warm and dry.  Neurological:     Mental Status: She is alert and oriented to person, place, and time.     No palpable bilateral breast masses.  No palpable bilateral axillary  adenopathy.  No nipple changes or nipple discharge.   CMP Latest Ref Rng & Units 09/14/2018  Glucose 70 - 99 mg/dL 124(H)  BUN 6 - 20 mg/dL 20  Creatinine 0.44 - 1.00 mg/dL 0.98  Sodium 135 - 145 mmol/L 141  Potassium 3.5 - 5.1 mmol/L 3.7  Chloride 98 - 111  mmol/L 106  CO2 22 - 32 mmol/L 25  Calcium 8.9 - 10.3 mg/dL 9.5  Total Protein 6.5 - 8.1 g/dL 7.6  Total Bilirubin 0.3 - 1.2 mg/dL 0.5  Alkaline Phos 38 - 126 U/L 75  AST 15 - 41 U/L 26  ALT 0 - 44 U/L 36   CBC Latest Ref Rng & Units 09/14/2018  WBC 4.0 - 10.5 K/uL 17.7(H)  Hemoglobin 12.0 - 15.0 g/dL 12.8  Hematocrit 36 - 46 % 38.9  Platelets 150 - 400 K/uL 257    No images are attached to the encounter.  MR BREAST BILATERAL W WO CONTRAST INC CAD  Result Date: 07/26/2019 CLINICAL DATA:  54 year old female with recently diagnosed invasive ductal carcinoma of the right breast post stereotactic guided biopsy of calcifications in the medial right breast (although calcifications were not identified in the specimen radiographs). Patient also had stereotactic guided biopsy of calcifications in the lateral right breast with benign concordant pathology demonstrating fibroadenomatoid and fibrocystic changes with associated calcifications. LABS:  Not applicable. EXAM: BILATERAL BREAST MRI WITH AND WITHOUT CONTRAST TECHNIQUE: Multiplanar, multisequence MR images of both breasts were obtained prior to and following the intravenous administration of 6 ml of Gadavist Three-dimensional MR images were rendered by post-processing of the original MR data on an independent workstation. The three-dimensional MR images were interpreted, and findings are reported in the following complete MRI report for this study. Three dimensional images were evaluated at the independent DynaCad workstation COMPARISON:  Previous exams. FINDINGS: Breast composition: d.  Extreme fibroglandular tissue. Background parenchymal enhancement: There is marked background parenchymal  enhancement of the bilateral breasts which decreases the sensitivity for detection of malignancy. Right breast: Biopsied related changes with moderate sized hematoma in the upper-outer right breast at site of prior benign biopsy. There are also biopsy related changes with a small hematoma in the inner right breast at site of biopsy proven malignancy. There is no abnormal enhancement at the site of biopsy proven malignancy in the inner right breast or elsewhere in the right breast to suggest additional sites of disease. Left breast: No mass or abnormal enhancement. Lymph nodes: No morphologically abnormal axillary lymph nodes. No internal mammary lymphadenopathy. Ancillary findings:  None. IMPRESSION: 1. Small hematoma and biopsy related changes at site of biopsy proven malignancy in the inner right breast, however no abnormal enhancement or enhancing masses at the site of biopsy proven malignancy or elsewhere in the right breast. 2.  No MRI evidence of malignancy in the left breast. RECOMMENDATION: Treatment plan for known right breast malignancy. BI-RADS CATEGORY  6: Known biopsy-proven malignancy. Electronically Signed   By: Everlean Alstrom M.D.   On: 07/26/2019 09:32   MM Digital Diagnostic Unilat R  Result Date: 07/05/2019 CLINICAL DATA:  54 year old female presenting as a recall from screening for possible right breast calcifications. EXAM: DIGITAL DIAGNOSTIC RIGHT MAMMOGRAM COMPARISON:  Previous exam(s). ACR Breast Density Category d: The breast tissue is extremely dense, which lowers the sensitivity of mammography. FINDINGS: Spot 2D and full field mL views of the right breast were performed. There are numerous groups of punctate and amorphous calcifications with similar morphology throughout the upper central and upper outer quadrant of the right breast. There are a few scattered layering forms on lateral view, however the majority do not layer. The total span of calcifications is at least 6.4 cm.  IMPRESSION: Numerous groups of punctate and amorphous calcifications with similar morphology throughout the upper central and upper outer quadrant of the right breast,  spanning at least 6.4 cm, are indeterminate. RECOMMENDATION: Stereotactic core needle biopsy x2 of the most medial and most lateral groups of calcifications. The patient will be contacted by our scheduling department to arrange biopsy. BI-RADS CATEGORY  4: Suspicious. Electronically Signed   By: Audie Pinto M.D.   On: 07/05/2019 16:21   MM CLIP PLACEMENT RIGHT  Result Date: 07/13/2019 CLINICAL DATA:  Status post stereotactic biopsies of the right breast. EXAM: 3D DIAGNOSTIC RIGHT MAMMOGRAM POST STEREOTACTIC BIOPSY COMPARISON:  Previous exam(s). FINDINGS: 3D Mammographic images were obtained following stereotactic guided biopsies of the right breast. There is an X shaped clip in the lateral aspect of the right breast and a coil shaped clip in the medial aspect of the right breast. IMPRESSION: Status post stereotactic biopsies of the right breast. There is an X shaped clip in the lateral aspect of the breast and a coil shaped clip in the medial aspect of the breast. Calcifications were not seen on specimen radiographs of biopsy of the medial breast. Correlation with pathology is recommended. Repeat biopsy versus six-month follow-up recommended depending on pathology of the biopsy lateral breast. Final Assessment: Post Procedure Mammograms for Marker Placement Electronically Signed   By: Lillia Mountain M.D.   On: 07/13/2019 15:08   MM RT BREAST BX W LOC DEV 1ST LESION IMAGE BX SPEC STEREO GUIDE  Addendum Date: 07/18/2019   ADDENDUM REPORT: 07/18/2019 13:41 ADDENDUM: PATHOLOGY revealed: A. BREAST, RIGHT LATERAL; STEREOTACTIC BIOPSY: - FIBROADENOMATOID AND FIBROCYSTIC CHANGES WITH ASSOCIATED CALCIFICATIONS. - NEGATIVE FOR ATYPIA AND MALIGNANCY. B. BREAST, RIGHT MEDIAL; STEREOTACTIC BIOPSY: - INVASIVE MAMMARY CARCINOMA, NO SPECIAL TYPE. 2 mm in  this sample. Grade 1. Ductal carcinoma in situ: Not identified. Lymphovascular invasion: Not identified. Pathology results are CONCORDANT with imaging findings, per Dr. Lillia Mountain. Pathology results and recommendations below were discussed with patient by telephone on 07/18/2019. Patient reported biopsy site doing well with slight tenderness at the site. Post biopsy care instructions were reviewed and questions were answered. Patient was instructed to call [facility] if any concerns or questions arise related to the biopsy. Recommendation: Surgical referral. Request for surgical referral was relayed to [place] and Tanya Nones RN at [facility] by Electa Sniff RN on 07/18/2019. BREAST MRI AND ADDITIONAL STEREOTACTIC BIOPSY OF CENTRAL CALCIFICATIONS IS RECOMMENDED FOR EXTENT OF DISEASE. Addendum by Electa Sniff RN on 07/18/2019. Electronically Signed   By: Lillia Mountain M.D.   On: 07/18/2019 13:41   Result Date: 07/18/2019 CLINICAL DATA:  Indeterminate calcifications in the right breast. EXAM: RIGHT BREAST STEREOTACTIC CORE NEEDLE BIOPSIES COMPARISON:  Previous exams. FINDINGS: The patient and I discussed the procedure of stereotactic-guided biopsy including benefits and alternatives. We discussed the high likelihood of a successful procedure. We discussed the risks of the procedure including infection, bleeding, tissue injury, clip migration, and inadequate sampling. Informed written consent was given. The usual time out protocol was performed immediately prior to the procedure. Using sterile technique and 1% lidocaine and 1% lidocaine with epinephrine as local anesthetic, under stereotactic guidance, a 9 gauge vacuum assisted device was used to perform core needle biopsy of calcifications in the lateral aspect of the right breast using a superior to inferior approach. Specimen radiograph was performed showing calcifications are present in the tissue samples. Specimens with  calcifications are identified for pathology. Lesion quadrant: Lateral At the conclusion of the procedure, X shaped tissue marker clip was deployed into the biopsy cavity. Follow-up 2-view mammogram was performed and dictated  separately. The patient and I discussed the procedure of stereotactic-guided biopsy including benefits and alternatives. We discussed the high likelihood of a successful procedure. We discussed the risks of the procedure including infection, bleeding, tissue injury, clip migration, and inadequate sampling. Informed written consent was given. The usual time out protocol was performed immediately prior to the procedure. Using sterile technique and 1% lidocaine and 1% lidocaine with epinephrine as local anesthetic, under stereotactic guidance, a 9 gauge vacuum assisted device was used to perform core needle biopsy of calcifications in the medial aspect of the right breast using a superior to inferior approach. Specimen radiograph was performed showing NO CALCIFICATIONS ARE PRESENT IN THE TISSUE SAMPLES. Specimens with calcifications are identified for pathology. Lesion quadrant: Medial At the conclusion of the procedure, coil shaped tissue marker clip was deployed into the biopsy cavity. Follow-up 2-view mammogram was performed and dictated separately. IMPRESSION: Stereotactic-guided biopsies of the right breast. SPECIMEN RADIOGRAPH SHOWED CALCIFICATIONS IN THE LATERAL ASPECT OF THE BREAST AND NO CALCIFICATIONS IN THE MEDIAL ASPECT OF THE BREAST. No apparent complications. Electronically Signed: By: Lillia Mountain M.D. On: 07/13/2019 14:51   MM RT BREAST BX W LOC DEV EA AD LESION IMG BX SPEC STEREO GUIDE  Addendum Date: 07/18/2019   ADDENDUM REPORT: 07/18/2019 13:41 ADDENDUM: PATHOLOGY revealed: A. BREAST, RIGHT LATERAL; STEREOTACTIC BIOPSY: - FIBROADENOMATOID AND FIBROCYSTIC CHANGES WITH ASSOCIATED CALCIFICATIONS. - NEGATIVE FOR ATYPIA AND MALIGNANCY. B. BREAST, RIGHT MEDIAL; STEREOTACTIC BIOPSY:  - INVASIVE MAMMARY CARCINOMA, NO SPECIAL TYPE. 2 mm in this sample. Grade 1. Ductal carcinoma in situ: Not identified. Lymphovascular invasion: Not identified. Pathology results are CONCORDANT with imaging findings, per Dr. Lillia Mountain. Pathology results and recommendations below were discussed with patient by telephone on 07/18/2019. Patient reported biopsy site doing well with slight tenderness at the site. Post biopsy care instructions were reviewed and questions were answered. Patient was instructed to call [facility] if any concerns or questions arise related to the biopsy. Recommendation: Surgical referral. Request for surgical referral was relayed to [place] and Tanya Nones RN at [facility] by Electa Sniff RN on 07/18/2019. BREAST MRI AND ADDITIONAL STEREOTACTIC BIOPSY OF CENTRAL CALCIFICATIONS IS RECOMMENDED FOR EXTENT OF DISEASE. Addendum by Electa Sniff RN on 07/18/2019. Electronically Signed   By: Lillia Mountain M.D.   On: 07/18/2019 13:41   Result Date: 07/18/2019 CLINICAL DATA:  Indeterminate calcifications in the right breast. EXAM: RIGHT BREAST STEREOTACTIC CORE NEEDLE BIOPSIES COMPARISON:  Previous exams. FINDINGS: The patient and I discussed the procedure of stereotactic-guided biopsy including benefits and alternatives. We discussed the high likelihood of a successful procedure. We discussed the risks of the procedure including infection, bleeding, tissue injury, clip migration, and inadequate sampling. Informed written consent was given. The usual time out protocol was performed immediately prior to the procedure. Using sterile technique and 1% lidocaine and 1% lidocaine with epinephrine as local anesthetic, under stereotactic guidance, a 9 gauge vacuum assisted device was used to perform core needle biopsy of calcifications in the lateral aspect of the right breast using a superior to inferior approach. Specimen radiograph was performed showing calcifications are  present in the tissue samples. Specimens with calcifications are identified for pathology. Lesion quadrant: Lateral At the conclusion of the procedure, X shaped tissue marker clip was deployed into the biopsy cavity. Follow-up 2-view mammogram was performed and dictated separately. The patient and I discussed the procedure of stereotactic-guided biopsy including benefits and alternatives. We discussed the high likelihood of a  successful procedure. We discussed the risks of the procedure including infection, bleeding, tissue injury, clip migration, and inadequate sampling. Informed written consent was given. The usual time out protocol was performed immediately prior to the procedure. Using sterile technique and 1% lidocaine and 1% lidocaine with epinephrine as local anesthetic, under stereotactic guidance, a 9 gauge vacuum assisted device was used to perform core needle biopsy of calcifications in the medial aspect of the right breast using a superior to inferior approach. Specimen radiograph was performed showing NO CALCIFICATIONS ARE PRESENT IN THE TISSUE SAMPLES. Specimens with calcifications are identified for pathology. Lesion quadrant: Medial At the conclusion of the procedure, coil shaped tissue marker clip was deployed into the biopsy cavity. Follow-up 2-view mammogram was performed and dictated separately. IMPRESSION: Stereotactic-guided biopsies of the right breast. SPECIMEN RADIOGRAPH SHOWED CALCIFICATIONS IN THE LATERAL ASPECT OF THE BREAST AND NO CALCIFICATIONS IN THE MEDIAL ASPECT OF THE BREAST. No apparent complications. Electronically Signed: By: Lillia Mountain M.D. On: 07/13/2019 14:51    Assessment and plan- Patient is a 54 y.o. 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  One end of the calcification was  negative for malignancy and the medialmost one was positive for invasive mammary carcinoma.  I discussed with the patient that if breast conservation is desired she would need additional biopsies to a certain what is in between those areas.  However if the patient is leaning towards a mastectomy then I would recommend upfront surgery and decide about adjuvant treatment based on final pathology.  Given her family history of colon cancer in mother and prostate cancer in father I will be referring her to genetic testing and we can obtain a stat BRCA testing.  If BRCA testing is positive bilateral mastectomy with reconstruction would be recommended.  If genetic testing is negative patient could consider unilateral mastectomy with reconstruction.  From my standpoint patient does not require any neoadjuvant treatment for a grade 1 ER/PR positive HER-2 negative breast cancer and at this time I will await final pathology to decide further management.  Treatment will be given with a curative intent  For follow-up with me would be determined based on her surgery date.  Thank you for this kind referral and the opportunity to participate in the care of this patient   Visit Diagnosis 1. Malignant neoplasm of upper-outer quadrant of right breast in female, estrogen receptor positive ([place])   2. Goals of care, counseling/discussion     Dr. Randa Evens, MD, MPH [facility] [phone] 07/30/2019 4:31 PM

## 2019-07-31 ENCOUNTER — Ambulatory Visit: Payer: Self-pay | Admitting: General Surgery

## 2019-07-31 NOTE — H&P (Signed)
PATIENT PROFILE: Tiffany Acevedo is a 54 y.o. female who presents to the Clinic for consultation at the request of Dr. Carrie Acevedo for evaluation of right breast cancer.  PCP:  Tiffany Nightingale, PA  HISTORY OF PRESENT ILLNESS: Tiffany Acevedo reports how her usual screening mammogram. In the screening mammogram calcifications were seen. These led to diagnostic mammogram. Diagnostic mammogram shows excision of calcification from one side of the breast to the other. At least a span of 6 cm of the calcifications. Stereotactic core needle biopsy was done on the 2 extremes. The medial biopsy showed invasive mammary carcinoma. The lateral biopsy showed fibrocystic changes without atypia or malignancy. I personally evaluated the images and I discussed it personally with the radiologist regarding the calcifications in between the 2 biopsies.  Patient reports significant soreness on the lateral aspect with a large bruise. Otherwise she is doing well. She denies any previous palpable breast mass or skin changes. She denies any nipple discharge.  Family history of breast cancer: None Family history of other cancers: Mother colorectal cancer, father prostate Menarche: 71 years old Menopause: N/A Used OCP: Yes Used estrogen and progesterone therapy: None History of Radiation to the chest: No Previous breast biopsy: None   PROBLEM LIST:        Problem List  Date Reviewed: 12/22/2018       Noted   Left lower quadrant pain Unknown   Intractable migraine with aura without status migrainosus 10/29/2013   Hypertension Unknown      GENERAL REVIEW OF SYSTEMS:   General ROS: negative for - chills, fatigue, fever, weight gain or weight loss Allergy and Immunology ROS: negative for - hives  Hematological and Lymphatic ROS: negative for - bleeding problems or bruising, negative for palpable nodes Endocrine ROS: negative for - heat or cold intolerance, hair changes Respiratory ROS: negative for  - cough, shortness of breath or wheezing Cardiovascular ROS: no chest pain or palpitations GI ROS: negative for nausea, vomiting, abdominal pain, diarrhea, constipation Musculoskeletal ROS: negative for - joint swelling or muscle pain Neurological ROS: negative for - confusion, syncope Dermatological ROS: negative for pruritus and rash Psychiatric: negative for anxiety, depression, difficulty sleeping and memory loss  MEDICATIONS: Current Medications        Current Outpatient Medications  Medication Sig Dispense Refill  . butalbital-aspirin-caffeine (FIORINAL) 50-325-40 mg tablet Take 1 tablet by mouth every 4 (four) hours as needed for Pain for up to 10 doses. 10 tablet 3  . galcanezumab-gnlm (EMGALITY PEN) 120 mg/mL PnIj Inject subcutaneously    . hydroCHLOROthiazide (MICROZIDE) 12.5 mg capsule Take 1 capsule (12.5 mg total) by mouth once daily 90 capsule 1  . ibuprofen (ADVIL,MOTRIN) 800 MG tablet Take 800 mg by mouth every 6 (six) hours as needed for Pain.    Marland Kitchen imipramine (TOFRANIL) 50 MG tablet Take 1 tablet (50 mg total) by mouth nightly 30 tablet 2  . multivitamin tablet Take by mouth    . omeprazole (PRILOSEC) 40 MG DR capsule     . rizatriptan (MAXALT) 10 MG tablet TAKE 1 TABLET BY MOUTH ONCE AS NEEDED FOR MIGRAINE FOR UP TO 1 DOSE. MAY TAKE SECOND DOSE AFTER 2 HOURS IF NEEDED. 10 tablet 8  . venlafaxine (EFFEXOR-XR) 150 MG XR capsule Take 1 capsule (150 mg total) by mouth once daily 90 capsule 1  . cyclobenzaprine (FLEXERIL) 10 MG tablet Take 1 tablet (10 mg total) by mouth 2 (two) times daily as needed (pain). 60 tablet 3  . imipramine (  TOFRANIL) 50 MG tablet Take 1 tablet (50 mg total) by mouth nightly for 90 days 90 tablet 3  . metoprolol succinate (TOPROL-XL) 100 MG XL tablet Take 1 tablet (100 mg total) by mouth once daily for 90 days 90 tablet 3  . ondansetron (ZOFRAN) 4 MG tablet Take 1 tablet (4 mg total) by mouth once as needed for Nausea for up to 1 dose. 20  tablet 5   No current facility-administered medications for this visit.      ALLERGIES: Oxycodone-acetaminophen and Sulfa (sulfonamide antibiotics)  PAST MEDICAL HISTORY:     Past Medical History:  Diagnosis Date  . Hypertension   . Kidney stones   . Migraines     PAST SURGICAL HISTORY:      Past Surgical History:  Procedure Laterality Date  . CESAREAN SECTION, LOW TRANSVERSE  04/09/1992  . CESAREAN SECTION, LOW TRANSVERSE  01/08/2003  . CESAREAN SECTION, LOW TRANSVERSE  03/18/2010  . COLONOSCOPY  03/22/2013   Dr. Kurtis Acevedo @ [place], [facility](m), rpt 5 yrs per [facility]  . COLONOSCOPY  07/25/2007   Dr. Kurtis Acevedo @ [facility] - [facility](m)  . TENSION-FREE VAGINAL TAPE  08/16/2003  . TUBAL LIGATION  01/08/2003     FAMILY HISTORY:      Family History  Problem Relation Age of Onset  . Colon cancer Mother 78  . Heart disease Father   . High blood pressure (Hypertension) Father   . Stroke Father   . Prostate cancer Father   . High blood pressure (Hypertension) Sister   . Heart disease Sister   . Migraines Daughter   . No Known Problems Brother   . Diabetes type II Maternal Grandmother   . No Known Problems Maternal Grandfather   . No Known Problems Paternal Grandmother   . No Known Problems Paternal Grandfather   . No Known Problems Sister      SOCIAL HISTORY: Social History          Socioeconomic History  . Marital status: Married    Spouse name: Not on file  . Number of children: Not on file  . Years of education: Not on file  . Highest education level: Not on file  Occupational History  . Not on file  Tobacco Use  . Smoking status: Never Smoker  . Smokeless tobacco: Never Used  Substance and Sexual Activity  . Alcohol use: Yes    Alcohol/week: 0.0 standard drinks    Comment: Occasional  . Drug use: No  . Sexual activity: Yes    Partners: Male    Birth control/protection: Surgical    Comment: tubal   Other Topics Concern  . Not on file  Social History Narrative  . Not on file   Social Determinants of Health      Financial Resource Strain:   . Difficulty of Paying Living Expenses:   Food Insecurity:   . Worried About Charity fundraiser in the Last Year:   . Arboriculturist in the Last Year:   Transportation Needs:   . Film/video editor (Medical):   Marland Kitchen Lack of Transportation (Non-Medical):       PHYSICAL EXAM:    Vitals:   07/20/19 0843  BP: 101/65  Pulse: 72   Body mass index is 25.06 kg/m. Weight: 66.2 kg (146 lb)   GENERAL: Alert, active, oriented x3  HEENT: Pupils equal reactive to light. Extraocular movements are intact. Sclera clear. Palpebral conjunctiva normal red color.Pharynx clear.  NECK: Supple with no palpable mass and no adenopathy.  LUNGS: Sound clear with no rales rhonchi or wheezes.  HEART: Regular rhythm S1 and S2 without murmur.  BREAST: Right breast with a palpable hematoma on the lateral aspect. There is a large bruise. No palpable lymphadenopathy or any other masses in other areas of the breast. Left breast breasts appear normal, no suspicious masses, no skin or nipple changes or axillary nodes.  ABDOMEN: Soft and depressible, nontender with no palpable mass, no hepatomegaly.  EXTREMITIES: Well-developed well-nourished symmetrical with no dependent edema.  NEUROLOGICAL: Awake alert oriented, facial expression symmetrical, moving all extremities.  REVIEW OF DATA: I have reviewed the following data today:      Appointment on 05/25/2019  Component Date Value  . WBC (White Blood Cell Co* 05/25/2019 7.9   . RBC (Red Blood Cell Coun* 05/25/2019 4.54   . Hemoglobin 05/25/2019 13.4   . Hematocrit 05/25/2019 41.3   . MCV (Mean Corpuscular Vo* 05/25/2019 91.0   . MCH (Mean Corpuscular He* 05/25/2019 29.5   . MCHC (Mean Corpuscular H* 05/25/2019 32.4   . Platelet Count 05/25/2019 183   . RDW-CV (Red Cell Distrib*  05/25/2019 12.4   . MPV (Mean Platelet Volum* 05/25/2019 12.4   . Neutrophils 05/25/2019 4.79   . Lymphocytes 05/25/2019 2.17   . Monocytes 05/25/2019 0.63   . Eosinophils 05/25/2019 0.20   . Basophils 05/25/2019 0.07   . Neutrophil % 05/25/2019 60.9   . Lymphocyte % 05/25/2019 27.6   . Monocyte % 05/25/2019 8.0   . Eosinophil % 05/25/2019 2.5   . Basophil% 05/25/2019 0.9   . Immature Granulocyte % 05/25/2019 0.1   . Immature Granulocyte Cou* 05/25/2019 0.01   . Glucose 05/25/2019 105   . Sodium 05/25/2019 141   . Potassium 05/25/2019 4.4   . Chloride 05/25/2019 103   . Carbon Dioxide (CO2) 05/25/2019 29.0   . Urea Nitrogen (BUN) 05/25/2019 24   . Creatinine 05/25/2019 0.9   . Glomerular Filtration Ra* 05/25/2019 65   . Calcium 05/25/2019 9.6   . AST  05/25/2019 28   . ALT  05/25/2019 32   . Alk Phos (alkaline Phosp* 05/25/2019 87   . Albumin 05/25/2019 4.3   . Bilirubin, Total 05/25/2019 0.5   . Protein, Total 05/25/2019 7.0   . A/G Ratio 05/25/2019 1.6   . Cholesterol, Total 05/25/2019 187   . Triglyceride 05/25/2019 142   . HDL (High Density Lipopr* 05/25/2019 43.7   . LDL Calculated 05/25/2019 115   . VLDL Cholesterol 05/25/2019 28   . Cholesterol/HDL Ratio 05/25/2019 4.3   . Color 05/25/2019 Yellow   . Clarity 05/25/2019 Clear   . Specific Gravity 05/25/2019 1.020   . pH, Urine 05/25/2019 6.0   . Protein, Urinalysis 05/25/2019 Negative   . Glucose, Urinalysis 05/25/2019 Negative   . Ketones, Urinalysis 05/25/2019 Negative   . Blood, Urinalysis 05/25/2019 Negative   . Nitrite, Urinalysis 05/25/2019 Negative   . Leukocyte Esterase, Urin* 05/25/2019 Negative   . White Blood Cells, Urina* 05/25/2019 0-3   . Red Blood Cells, Urinaly* 05/25/2019 None Seen   . Bacteria, Urinalysis 05/25/2019 Rare*  . Squamous Epithelial Cell* 05/25/2019 Rare   . Hemoglobin A1C 05/25/2019 6.1*  . Average Blood Glucose (C* 05/25/2019 128      ASSESSMENT: Ms. Hascall is a 54 y.o.  female presenting for consultation for right breast cancer.    Patient was oriented again about the pathology results. Surgical alternatives were discussed with  patient including partial vs total mastectomy. Surgical technique and post operative care was discussed with patient. Risk of surgery was discussed with patient including but not limited to: wound infection, seroma, hematoma, brachial plexopathy, mondor's disease (thrombosis of small veins of breast), chronic wound pain, breast lymphedema, altered sensation to the nipple and cosmesis among others.   Due to the extension of the calcifications it is impossible to determine if the calcifications in between the 2 biopsies are completely benign or they may have a malignant component. I had a long discussion with the patient about the alternative of having a total mastectomy versus breast conserving therapy. If breast conserving therapy is considered the patient will need an MRI of the breast for further evaluation of the calcifications and possible multiple core needle biopsies of this calcifications for complete evaluation of the extension of the cancer. She has not completely decided what type of surgery she wants so she wants to proceed with the MRI.  Patient had MRI done and does not show any other breast abnormality. Patient oriented about results and wants to proceed with total mastectomy with immediate reconstruction.   I discussed the case with plastic and reconstructive surgeon for evaluation of the patient for discussion of reconstruction alternatives.  Malignant neoplasm of upper-inner quadrant of right female breast, unspecified estrogen receptor status (CMS-HCC) [C50.211]    PLAN: 1. Right total mastectomy with sentinel lymph node biopsy (19301, [id number]) 2. Immediate reconstruction by Plastic and Reconstructive Surgery 3. Contact us if has any question or concern.   Patient verbalized understanding, all questions were  answered, and were agreeable with the plan outlined above.    Herbert Pun, MD  Electronically signed by Herbert Pun, MD

## 2019-08-07 ENCOUNTER — Encounter: Payer: Self-pay | Admitting: Licensed Clinical Social Worker

## 2019-08-07 ENCOUNTER — Telehealth: Payer: Self-pay | Admitting: Licensed Clinical Social Worker

## 2019-08-07 ENCOUNTER — Telehealth: Payer: BC Managed Care – PPO | Admitting: Plastic Surgery

## 2019-08-07 DIAGNOSIS — Z1509 Genetic susceptibility to other malignant neoplasm: Secondary | ICD-10-CM | POA: Insufficient documentation

## 2019-08-07 DIAGNOSIS — Z1501 Genetic susceptibility to malignant neoplasm of breast: Secondary | ICD-10-CM | POA: Insufficient documentation

## 2019-08-07 NOTE — Progress Notes (Signed)
Patient ID: Tiffany Acevedo, female   DOB: Apr 22, 1965, 54 y.o.   MRN: [id number] Patient called with questions related to genetic testing results related to upcoming surgery, and scheduling colonoscopy.  Notified Dr. Janese Banks.  Virtual visit through [facility] scheduled for 08/08/19 at 1:00.  Patient aware.

## 2019-08-07 NOTE — Telephone Encounter (Signed)
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 Discussed this result in detail including cancers associated and management guidelines. Offered to have Tiffany Acevedo come back for an appointment, she would like to talk with her husband and sister and will call me back. I will call her once I have the remainder of the genetic test results back.

## 2019-08-08 ENCOUNTER — Inpatient Hospital Stay: Payer: BC Managed Care – PPO | Attending: Oncology | Admitting: Oncology

## 2019-08-08 DIAGNOSIS — Z1501 Genetic susceptibility to malignant neoplasm of breast: Secondary | ICD-10-CM | POA: Diagnosis not present

## 2019-08-08 DIAGNOSIS — C50411 Malignant neoplasm of upper-outer quadrant of right female breast: Secondary | ICD-10-CM

## 2019-08-08 DIAGNOSIS — Z7189 Other specified counseling: Secondary | ICD-10-CM | POA: Diagnosis not present

## 2019-08-08 DIAGNOSIS — Z1502 Genetic susceptibility to malignant neoplasm of ovary: Secondary | ICD-10-CM

## 2019-08-08 DIAGNOSIS — Z170 Estrogen receptor positive status [ER+]: Secondary | ICD-10-CM | POA: Diagnosis not present

## 2019-08-08 DIAGNOSIS — Z1509 Genetic susceptibility to other malignant neoplasm: Secondary | ICD-10-CM

## 2019-08-08 DIAGNOSIS — Z1589 Genetic susceptibility to other disease: Secondary | ICD-10-CM

## 2019-08-08 NOTE — Progress Notes (Addendum)
Patient ID: Tiffany Acevedo, female    DOB: [birth year], 54 y.o.   MRN: [id number]  Chief Complaint  Patient presents with  . Pre-op Exam      ICD-10-CM   1. Generalized anxiety disorder  F41.1   2. Malignant neoplasm of female breast, unspecified estrogen receptor status, unspecified laterality, unspecified site of breast ([place])  C50.919      History of Present Illness: Tiffany Acevedo is a 54 y.o.  female  with a history of right breast calcifications noted on screening mammogram on 06/18/19. Underwent bx and had invasive mammary carcinoma from medialmost bx site.  She presents for preoperative evaluation for upcoming procedure,immediate bilateral breast reconstruction with placement of tissue expanders and flex HD after bilateral mastectomies by general surgery, scheduled for 08/27/19 with Dr. Marla Roe.  Patient has decided on bilateral mastectomy due to genetic testing.  The patient has not had problems with anesthesia.  No personal or family history of DVT/PE.  No family or personal history of bleeding or clotting source.  Patient is not currently taking any blood thinners.  No history of MI/CVA/TIA.  No pulmonary diseases.  Summary of Previous Visit: Patient found to have calcifications on screening mammogram, right breast.  Diagnostic mammogram and biopsies showed invasive mammary carcinoma.  Preop bra size is  34 C.  No current plan for radiation provided lymph nodes are negative.  Job: Pharmacist, hospital, currently on certification  PMH: Hypertension, mostly well controlled but reports recently noticing some changes and inconsistency due to weight loss.  History of migraines, anxiety, kidney stones   Past Medical History: Allergies: Allergies  Allergen Reactions  . Percocet [Oxycodone-Acetaminophen] Other (See Comments)    Arms and legs go numb  . Sulfa Antibiotics Nausea And Vomiting    Current Medications:  Current Outpatient Medications:  .  EMGALITY 120  MG/ML SOAJ, Inject 120 mg into the skin every 28 (twenty-eight) days., Disp: , Rfl:  .  hydrochlorothiazide (MICROZIDE) 12.5 MG capsule, Take 1 capsule (12.5 mg total) by mouth daily., Disp: 30 capsule, Rfl: 2 .  ibuprofen (ADVIL,MOTRIN) 200 MG tablet, Take 400 mg by mouth every 6 (six) hours as needed for headache or moderate pain. , Disp: , Rfl:  .  imipramine (TOFRANIL) 50 MG tablet, Take 50 mg by mouth at bedtime. , Disp: , Rfl:  .  metoprolol succinate (TOPROL-XL) 100 MG 24 hr tablet, Take 100 mg by mouth at bedtime. , Disp: , Rfl:  .  Multiple Vitamin (MULTIVITAMIN PO), Take 1 tablet by mouth daily., Disp: , Rfl:  .  omeprazole (PRILOSEC) 20 MG capsule, Take 20 mg by mouth daily. , Disp: , Rfl:  .  ondansetron (ZOFRAN) 4 MG tablet, Take 1 tablet (4 mg total) by mouth every 8 (eight) hours as needed for nausea or vomiting., Disp: 8 tablet, Rfl: 0 .  rizatriptan (MAXALT) 10 MG tablet, Take 10 mg by mouth as needed for migraine. , Disp: , Rfl:  .  venlafaxine XR (EFFEXOR-XR) 150 MG 24 hr capsule, TAKE 1 CAPSULE BY MOUTH EVERY DAY WITH BREAKFAST (Patient taking differently: Take 150 mg by mouth daily with breakfast. ), Disp: 30 capsule, Rfl: 5  Current Facility-Administered Medications:  .  ciprofloxacin (CIPRO) tablet 500 mg, 500 mg, Oral, Once, Stoioff, Scott C, MD .  lidocaine (XYLOCAINE) 2 % jelly 1 application, 1 application, Urethral, Once, Stoioff, Ronda Fairly, MD  Past Medical Problems: Past Medical History:  Diagnosis Date  . Allergy   . Anxiety   .  Arthritis   . Depression   . Excessive weight gain 06/30/2016  . Family history of colon cancer   . Family history of colon cancer   . Family history of prostate cancer   . Frequent headaches   . GERD (gastroesophageal reflux disease)   . History of kidney stones   . Hypertension   . Migraines   . Pre-diabetes     Past Surgical History: Past Surgical History:  Procedure Laterality Date  . BREAST BIOPSY Right 07/13/2019   stereo  biopsy/ x clip/path pending  . BREAST BIOPSY Right 07/13/2019   stereo biopsy/coil clip/ path pending  . [place], 2004, 2013  . COLONOSCOPY  2015  . CYSTOSCOPY W/ RETROGRADES Right 09/13/2018   Procedure: CYSTOSCOPY WITH RETROGRADE PYELOGRAM;  Surgeon: Abbie Sons, MD;  Location: [facility];  Service: Urology;  Laterality: Right;  . CYSTOSCOPY/URETEROSCOPY/HOLMIUM LASER/STENT PLACEMENT Left 05/09/2018   Procedure: CYSTOSCOPY/URETEROSCOPY/HOLMIUM LASER/STENT PLACEMENT;  Surgeon: Abbie Sons, MD;  Location: [facility];  Service: Urology;  Laterality: Left;  . CYSTOSCOPY/URETEROSCOPY/HOLMIUM LASER/STENT PLACEMENT Right 09/13/2018   Procedure: CYSTOSCOPY/URETEROSCOPY/HOLMIUM LASER/STENT PLACEMENT;  Surgeon: Abbie Sons, MD;  Location: [facility];  Service: Urology;  Laterality: Right;  . INCONTINENCE SURGERY     BLADDER TUCK  . PILONIDAL CYST EXCISION    . STONE EXTRACTION WITH BASKET Right 09/13/2018   Procedure: STONE EXTRACTION WITH BASKET;  Surgeon: Abbie Sons, MD;  Location: [facility];  Service: Urology;  Laterality: Right;  . UPPER GI ENDOSCOPY      Social History: Social History   Socioeconomic History  . Marital status: Married    Spouse name: Not on file  . Number of children: Not on file  . Years of education: Not on file  . Highest education level: Not on file  Occupational History  . Not on file  Tobacco Use  . Smoking status: Never Smoker  . Smokeless tobacco: Never Used  Vaping Use  . Vaping Use: Never used  Substance and Sexual Activity  . Alcohol use: Yes    Comment: glass of wine occassionally  . Drug use: No  . Sexual activity: Yes    Partners: Male  Other Topics Concern  . Not on file  Social History Narrative  . Not on file   Social Determinants of Health   Financial Resource Strain:   . Difficulty of Paying Living Expenses:   Food Insecurity:   . Worried About Charity fundraiser in the Last Year:   . Arboriculturist in the  Last Year:   Transportation Needs:   . Film/video editor (Medical):   Marland Kitchen Lack of Transportation (Non-Medical):   Physical Activity:   . Days of Exercise per Week:   . Minutes of Exercise per Session:   Stress:   . Feeling of Stress :   Social Connections:   . Frequency of Communication with Friends and Family:   . Frequency of Social Gatherings with Friends and Family:   . Attends Religious Services:   . Active Member of Clubs or Organizations:   . Attends Archivist Meetings:   Marland Kitchen Marital Status:   Intimate Partner Violence:   . Fear of Current or Ex-Partner:   . Emotionally Abused:   Marland Kitchen Physically Abused:   . Sexually Abused:     Family History: Family History  Problem Relation Age of Onset  . Hyperlipidemia Mother   . Hypertension Mother   . Depression Mother   .  Colon cancer Mother 26  . Alcohol abuse Father   . Hyperlipidemia Father   . Stroke Father   . Hypertension Father   . Depression Father   . Heart disease Father   . Prostate cancer Father 72  . Arthritis Maternal Grandmother   . Hypertension Maternal Grandmother   . Depression Maternal Grandmother   . Arthritis Maternal Grandfather   . Stroke Maternal Grandfather   . Hypertension Maternal Grandfather   . Depression Maternal Grandfather   . Hypertension Paternal Grandmother   . Depression Paternal Grandmother   . Hypertension Paternal Grandfather   . Depression Paternal Grandfather   . Leukemia Maternal Aunt        d <50  . Cancer Paternal Aunt        unsure types    Review of Systems: Review of Systems  Constitutional: Negative.   Respiratory: Negative.   Cardiovascular: Negative.   Gastrointestinal: Negative.   Musculoskeletal: Negative.   Neurological: Positive for dizziness.    Physical Exam: Vital Signs BP (!) 114/55 (BP Location: Left Arm, Patient Position: Sitting, Cuff Size: Normal)   Pulse 79   Temp (!) 97.3 F (36.3 C) (Temporal)   Ht 5\' 4"  (1.626 m)   Wt 147 lb  3.2 oz (66.8 kg)   LMP 07/06/2019 (Approximate)   SpO2 100%   BMI 25.27 kg/m  Physical Exam Exam conducted with a chaperone present.  Constitutional:      General: She is not in acute distress.    Appearance: Normal appearance. She is not ill-appearing.  HENT:     Head: Normocephalic and atraumatic.  Eyes:     Pupils: Pupils are equal, round Neck:     Musculoskeletal: Normal range of motion.  Cardiovascular:     Rate and Rhythm: Normal rate and regular rhythm.     Pulses: Normal pulses.     Heart sounds: Normal heart sounds. No murmur.  Pulmonary:     Effort: Pulmonary effort is normal. No respiratory distress.     Breath sounds: Normal breath sounds. No wheezing.  Abdominal:     General: Abdomen is flat. There is no distension.     Palpations: Abdomen is soft.     Tenderness: There is no abdominal tenderness.  Musculoskeletal: Normal range of motion.  Skin:    General: Skin is warm and dry.     Findings: No erythema or rash.  Neurological:     General: No focal deficit present.     Mental Status: She is alert and oriented to person, place, and time. Mental status is at baseline.     Motor: No weakness.  Psychiatric:        Mood and Affect: Mood normal.        Behavior: Behavior normal.     Assessment/Plan: Patient is scheduled for bilateral breast reconstruction with placement of expanders and flex HD with Dr. Marla Roe after total mastectomies by general surgery on 08/27/19.  Risks, benefits, and alternatives of procedure discussed, questions answered and consent obtained.  Patient is aware that right NAC would need to be surgically excised due to the proximity of the cancer and has decided to undergo bilateral removal of NAC's.  Smoking Status: Non-smoker Last Mammogram: 06/18/2019; Results: Right breast calcifications noted  Caprini Score: 5; Risk Factors include: Age, current malignancy, and length of planned surgery. Recommendation for mechanical and  pharmacological prophylaxis. Encourage early ambulation.   Pictures obtained: 07/23/19  Post-op Rx sent to pharmacy: keflex, zofran, valium, norco.  Patient was provided with the breast reconstruction and General Surgical Risk consent document and Pain Medication Agreement prior to their appointment.  They had adequate time to read through the risk consent documents and Pain Medication Agreement. We also discussed them in person together during this preop appointment. All of their questions were answered to their satisfaction.  Recommended calling if they have any further questions.  Risk consent form and Pain Medication Agreement to be scanned into patient's chart.  The risks that can be encountered with and after placement of a breast expander placement were discussed and include the following but not limited to these: bleeding, infection, delayed healing, anesthesia risks, skin sensation changes, injury to structures including nerves, blood vessels, and muscles which may be temporary or permanent, allergies to tape, suture materials and glues, blood products, topical preparations or injected agents, skin contour irregularities, skin discoloration and swelling, deep vein thrombosis, cardiac and pulmonary complications, pain, which may persist, fluid accumulation, wrinkling of the skin over the expander, changes in nipple or breast sensation, expander leakage or rupture, faulty position of the expander, persistent pain, formation of tight scar tissue around the expander (capsular contracture), possible need for revisional surgery or staged procedures.   Electronically signed by: Carola Rhine Dasean Brow, PA-C 08/10/2019 10:59 AM

## 2019-08-08 NOTE — H&P (View-Only) (Signed)
Patient ID: Tiffany Acevedo, female    DOB: 1965-12-12, 54 y.o.   MRN: [id number]  Chief Complaint  Patient presents with  . Pre-op Exam      ICD-10-CM   1. Generalized anxiety disorder  F41.1   2. Malignant neoplasm of female breast, unspecified estrogen receptor status, unspecified laterality, unspecified site of breast ([place])  C50.919      History of Present Illness: Tiffany Acevedo is a 54 y.o.  female  with a history of right breast calcifications noted on screening mammogram on 06/18/19. Underwent bx and had invasive mammary carcinoma from medialmost bx site.  She presents for preoperative evaluation for upcoming procedure,immediate bilateral breast reconstruction with placement of tissue expanders and flex HD after bilateral mastectomies by general surgery, scheduled for 08/27/19 with Dr. Marla Roe.  Patient has decided on bilateral mastectomy due to genetic testing.  The patient has not had problems with anesthesia.  No personal or family history of DVT/PE.  No family or personal history of bleeding or clotting source.  Patient is not currently taking any blood thinners.  No history of MI/CVA/TIA.  No pulmonary diseases.  Summary of Previous Visit: Patient found to have calcifications on screening mammogram, right breast.  Diagnostic mammogram and biopsies showed invasive mammary carcinoma.  Preop bra size is  34 C.  No current plan for radiation provided lymph nodes are negative.  Job: Pharmacist, hospital, currently on certification  PMH: Hypertension, mostly well controlled but reports recently noticing some changes and inconsistency due to weight loss.  History of migraines, anxiety, kidney stones   Past Medical History: Allergies: Allergies  Allergen Reactions  . Percocet [Oxycodone-Acetaminophen] Other (See Comments)    Arms and legs go numb  . Sulfa Antibiotics Nausea And Vomiting    Current Medications:  Current Outpatient Medications:  .  EMGALITY 120  MG/ML SOAJ, Inject 120 mg into the skin every 28 (twenty-eight) days., Disp: , Rfl:  .  hydrochlorothiazide (MICROZIDE) 12.5 MG capsule, Take 1 capsule (12.5 mg total) by mouth daily., Disp: 30 capsule, Rfl: 2 .  ibuprofen (ADVIL,MOTRIN) 200 MG tablet, Take 400 mg by mouth every 6 (six) hours as needed for headache or moderate pain. , Disp: , Rfl:  .  imipramine (TOFRANIL) 50 MG tablet, Take 50 mg by mouth at bedtime. , Disp: , Rfl:  .  metoprolol succinate (TOPROL-XL) 100 MG 24 hr tablet, Take 100 mg by mouth at bedtime. , Disp: , Rfl:  .  Multiple Vitamin (MULTIVITAMIN PO), Take 1 tablet by mouth daily., Disp: , Rfl:  .  omeprazole (PRILOSEC) 20 MG capsule, Take 20 mg by mouth daily. , Disp: , Rfl:  .  ondansetron (ZOFRAN) 4 MG tablet, Take 1 tablet (4 mg total) by mouth every 8 (eight) hours as needed for nausea or vomiting., Disp: 8 tablet, Rfl: 0 .  rizatriptan (MAXALT) 10 MG tablet, Take 10 mg by mouth as needed for migraine. , Disp: , Rfl:  .  venlafaxine XR (EFFEXOR-XR) 150 MG 24 hr capsule, TAKE 1 CAPSULE BY MOUTH EVERY DAY WITH BREAKFAST (Patient taking differently: Take 150 mg by mouth daily with breakfast. ), Disp: 30 capsule, Rfl: 5  Current Facility-Administered Medications:  .  ciprofloxacin (CIPRO) tablet 500 mg, 500 mg, Oral, Once, Stoioff, Scott C, MD .  lidocaine (XYLOCAINE) 2 % jelly 1 application, 1 application, Urethral, Once, Stoioff, Ronda Fairly, MD  Past Medical Problems: Past Medical History:  Diagnosis Date  . Allergy   . Anxiety   .  Arthritis   . Depression   . Excessive weight gain 06/30/2016  . Family history of colon cancer   . Family history of colon cancer   . Family history of prostate cancer   . Frequent headaches   . GERD (gastroesophageal reflux disease)   . History of kidney stones   . Hypertension   . Migraines   . Pre-diabetes     Past Surgical History: Past Surgical History:  Procedure Laterality Date  . BREAST BIOPSY Right 07/13/2019   stereo  biopsy/ x clip/path pending  . BREAST BIOPSY Right 07/13/2019   stereo biopsy/coil clip/ path pending  . [place], 2004, 2013  . COLONOSCOPY  2015  . CYSTOSCOPY W/ RETROGRADES Right 09/13/2018   Procedure: CYSTOSCOPY WITH RETROGRADE PYELOGRAM;  Surgeon: Abbie Sons, MD;  Location: [facility];  Service: Urology;  Laterality: Right;  . CYSTOSCOPY/URETEROSCOPY/HOLMIUM LASER/STENT PLACEMENT Left 05/09/2018   Procedure: CYSTOSCOPY/URETEROSCOPY/HOLMIUM LASER/STENT PLACEMENT;  Surgeon: Abbie Sons, MD;  Location: [facility];  Service: Urology;  Laterality: Left;  . CYSTOSCOPY/URETEROSCOPY/HOLMIUM LASER/STENT PLACEMENT Right 09/13/2018   Procedure: CYSTOSCOPY/URETEROSCOPY/HOLMIUM LASER/STENT PLACEMENT;  Surgeon: Abbie Sons, MD;  Location: [facility];  Service: Urology;  Laterality: Right;  . INCONTINENCE SURGERY     BLADDER TUCK  . PILONIDAL CYST EXCISION    . STONE EXTRACTION WITH BASKET Right 09/13/2018   Procedure: STONE EXTRACTION WITH BASKET;  Surgeon: Abbie Sons, MD;  Location: [facility];  Service: Urology;  Laterality: Right;  . UPPER GI ENDOSCOPY      Social History: Social History   Socioeconomic History  . Marital status: Married    Spouse name: Not on file  . Number of children: Not on file  . Years of education: Not on file  . Highest education level: Not on file  Occupational History  . Not on file  Tobacco Use  . Smoking status: Never Smoker  . Smokeless tobacco: Never Used  Vaping Use  . Vaping Use: Never used  Substance and Sexual Activity  . Alcohol use: Yes    Comment: glass of wine occassionally  . Drug use: No  . Sexual activity: Yes    Partners: Male  Other Topics Concern  . Not on file  Social History Narrative  . Not on file   Social Determinants of Health   Financial Resource Strain:   . Difficulty of Paying Living Expenses:   Food Insecurity:   . Worried About Charity fundraiser in the Last Year:   . Arboriculturist in the  Last Year:   Transportation Needs:   . Film/video editor (Medical):   Tiffany Acevedo Kitchen Lack of Transportation (Non-Medical):   Physical Activity:   . Days of Exercise per Week:   . Minutes of Exercise per Session:   Stress:   . Feeling of Stress :   Social Connections:   . Frequency of Communication with Friends and Family:   . Frequency of Social Gatherings with Friends and Family:   . Attends Religious Services:   . Active Member of Clubs or Organizations:   . Attends Archivist Meetings:   Tiffany Acevedo Kitchen Marital Status:   Intimate Partner Violence:   . Fear of Current or Ex-Partner:   . Emotionally Abused:   Tiffany Acevedo Kitchen Physically Abused:   . Sexually Abused:     Family History: Family History  Problem Relation Age of Onset  . Hyperlipidemia Mother   . Hypertension Mother   . Depression Mother   .  Colon cancer Mother 40  . Alcohol abuse Father   . Hyperlipidemia Father   . Stroke Father   . Hypertension Father   . Depression Father   . Heart disease Father   . Prostate cancer Father 72  . Arthritis Maternal Grandmother   . Hypertension Maternal Grandmother   . Depression Maternal Grandmother   . Arthritis Maternal Grandfather   . Stroke Maternal Grandfather   . Hypertension Maternal Grandfather   . Depression Maternal Grandfather   . Hypertension Paternal Grandmother   . Depression Paternal Grandmother   . Hypertension Paternal Grandfather   . Depression Paternal Grandfather   . Leukemia Maternal Aunt        d <50  . Cancer Paternal Aunt        unsure types    Review of Systems: Review of Systems  Constitutional: Negative.   Respiratory: Negative.   Cardiovascular: Negative.   Gastrointestinal: Negative.   Musculoskeletal: Negative.   Neurological: Positive for dizziness.    Physical Exam: Vital Signs BP (!) 114/55 (BP Location: Left Arm, Patient Position: Sitting, Cuff Size: Normal)   Pulse 79   Temp (!) 97.3 F (36.3 C) (Temporal)   Ht 5\' 4"  (1.626 m)   Wt 147 lb  3.2 oz (66.8 kg)   LMP 07/06/2019 (Approximate)   SpO2 100%   BMI 25.27 kg/m  Physical Exam Exam conducted with a chaperone present.  Constitutional:      General: She is not in acute distress.    Appearance: Normal appearance. She is not ill-appearing.  HENT:     Head: Normocephalic and atraumatic.  Eyes:     Pupils: Pupils are equal, round Neck:     Musculoskeletal: Normal range of motion.  Cardiovascular:     Rate and Rhythm: Normal rate and regular rhythm.     Pulses: Normal pulses.     Heart sounds: Normal heart sounds. No murmur.  Pulmonary:     Effort: Pulmonary effort is normal. No respiratory distress.     Breath sounds: Normal breath sounds. No wheezing.  Abdominal:     General: Abdomen is flat. There is no distension.     Palpations: Abdomen is soft.     Tenderness: There is no abdominal tenderness.  Musculoskeletal: Normal range of motion.  Skin:    General: Skin is warm and dry.     Findings: No erythema or rash.  Neurological:     General: No focal deficit present.     Mental Status: She is alert and oriented to person, place, and time. Mental status is at baseline.     Motor: No weakness.  Psychiatric:        Mood and Affect: Mood normal.        Behavior: Behavior normal.     Assessment/Plan: Patient is scheduled for bilateral breast reconstruction with placement of expanders and flex HD with Dr. Marla Roe after total mastectomies by general surgery on 08/27/19.  Risks, benefits, and alternatives of procedure discussed, questions answered and consent obtained.  Patient is aware that right NAC would need to be surgically excised due to the proximity of the cancer and has decided to undergo bilateral removal of NAC's.  Smoking Status: Non-smoker Last Mammogram: 06/18/2019; Results: Right breast calcifications noted  Caprini Score: 5; Risk Factors include: Age, current malignancy, and length of planned surgery. Recommendation for mechanical and  pharmacological prophylaxis. Encourage early ambulation.   Pictures obtained: 07/23/19  Post-op Rx sent to pharmacy: keflex, zofran, valium, norco.  Patient was provided with the breast reconstruction and General Surgical Risk consent document and Pain Medication Agreement prior to their appointment.  They had adequate time to read through the risk consent documents and Pain Medication Agreement. We also discussed them in person together during this preop appointment. All of their questions were answered to their satisfaction.  Recommended calling if they have any further questions.  Risk consent form and Pain Medication Agreement to be scanned into patient's chart.  The risks that can be encountered with and after placement of a breast expander placement were discussed and include the following but not limited to these: bleeding, infection, delayed healing, anesthesia risks, skin sensation changes, injury to structures including nerves, blood vessels, and muscles which may be temporary or permanent, allergies to tape, suture materials and glues, blood products, topical preparations or injected agents, skin contour irregularities, skin discoloration and swelling, deep vein thrombosis, cardiac and pulmonary complications, pain, which may persist, fluid accumulation, wrinkling of the skin over the expander, changes in nipple or breast sensation, expander leakage or rupture, faulty position of the expander, persistent pain, formation of tight scar tissue around the expander (capsular contracture), possible need for revisional surgery or staged procedures.   Electronically signed by: Carola Rhine Klara Stjames, PA-C 08/10/2019 10:59 AM

## 2019-08-10 ENCOUNTER — Encounter: Payer: Self-pay | Admitting: Licensed Clinical Social Worker

## 2019-08-10 ENCOUNTER — Telehealth: Payer: Self-pay | Admitting: Licensed Clinical Social Worker

## 2019-08-10 ENCOUNTER — Ambulatory Visit: Payer: Self-pay | Admitting: Licensed Clinical Social Worker

## 2019-08-10 ENCOUNTER — Other Ambulatory Visit: Payer: Self-pay

## 2019-08-10 ENCOUNTER — Ambulatory Visit (INDEPENDENT_AMBULATORY_CARE_PROVIDER_SITE_OTHER): Payer: BC Managed Care – PPO | Admitting: Surgical

## 2019-08-10 ENCOUNTER — Encounter: Payer: Self-pay | Admitting: Surgical

## 2019-08-10 VITALS — BP 114/55 | HR 79 | Temp 97.3°F | Ht 64.0 in | Wt 147.2 lb

## 2019-08-10 DIAGNOSIS — Z800 Family history of malignant neoplasm of digestive organs: Secondary | ICD-10-CM

## 2019-08-10 DIAGNOSIS — Z8042 Family history of malignant neoplasm of prostate: Secondary | ICD-10-CM

## 2019-08-10 DIAGNOSIS — Z1379 Encounter for other screening for genetic and chromosomal anomalies: Secondary | ICD-10-CM

## 2019-08-10 DIAGNOSIS — C50919 Malignant neoplasm of unspecified site of unspecified female breast: Secondary | ICD-10-CM

## 2019-08-10 DIAGNOSIS — Z170 Estrogen receptor positive status [ER+]: Secondary | ICD-10-CM

## 2019-08-10 DIAGNOSIS — Z1501 Genetic susceptibility to malignant neoplasm of breast: Secondary | ICD-10-CM

## 2019-08-10 DIAGNOSIS — C50411 Malignant neoplasm of upper-outer quadrant of right female breast: Secondary | ICD-10-CM

## 2019-08-10 DIAGNOSIS — F411 Generalized anxiety disorder: Secondary | ICD-10-CM

## 2019-08-10 MED ORDER — HYDROCODONE-ACETAMINOPHEN 5-325 MG PO TABS: 1.0000 | ORAL_TABLET | Freq: Four times a day (QID) | ORAL | 0 refills | Status: AC | PRN

## 2019-08-10 MED ORDER — DIAZEPAM 2 MG PO TABS
2.0000 mg | ORAL_TABLET | Freq: Two times a day (BID) | ORAL | 0 refills | Status: DC | PRN
Start: 2019-08-10 — End: 2019-11-22

## 2019-08-10 MED ORDER — CEPHALEXIN 500 MG PO CAPS: 500.0000 mg | ORAL_CAPSULE | Freq: Four times a day (QID) | ORAL | 0 refills | Status: AC

## 2019-08-10 MED ORDER — ONDANSETRON HCL 4 MG PO TABS: 4.0000 mg | ORAL_TABLET | Freq: Three times a day (TID) | ORAL | 0 refills | Status: DC | PRN

## 2019-08-10 NOTE — Progress Notes (Signed)
Genetic Test Results  HPI:  Ms. Davidow was previously seen in the [facility] due to a personal and family history of cancer and concerns regarding a hereditary predisposition to cancer. Please refer to our prior cancer genetics clinic note for more information regarding our discussion, assessment and recommendations, at the time. Ms. Treese recent genetic test results were disclosed to her, as were recommendations warranted by these results. These results and recommendations are discussed in more detail below.  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 The report date is 08/10/2019.  The STAT Breast cancer panel offered by [facility] includes sequencing and rearrangement analysis for the following 9 genes:  ATM, BRCA1, BRCA2, CDH1, CHEK2, PALB2, PTEN, STK11 and TP53.    The Common Hereditary Cancers Panel + Leukemia panel offered by [facility] includes sequencing and/or deletion duplication testing of the following 54 genes: APC*, ATM*, AXIN2, BARD1, BLM, BMPR1A, BRCA1, BRCA2, BRIP1, CDH1, CDK4, CDKN2A (p14ARF), CDKN2A (p16INK4a), CEBPA, CHEK2, CTNNA1, DICER1*, EPCAM*, GATA2, GREM1*, HOXB13, HRAS, KIT, MEN1*, MLH1*, MSH2*, MSH3*, MSH6*, MUTYH, NBN, NF1*, NTHL1, PALB2, PDGFRA, PMS2*, POLD1*, POLE, PTEN*, RAD50, RAD51C, RAD51D, RUNX1, SDHA*, SDHB, SDHC*, SDHD, SMAD4, SMARCA4, STK11, TERC, TERT, TP53, TSC1*, TSC2, VHL.     FAMILY HISTORY:  We obtained a detailed, 4-generation family history.  Significant diagnoses are listed below: Family History  Problem Relation Age of Onset  .  Hyperlipidemia Mother   . Hypertension Mother   . Depression Mother   . Colon cancer Mother 71  . Alcohol abuse Father   . Hyperlipidemia Father   . Stroke Father   . Hypertension Father   . Depression Father   . Heart disease Father   . Prostate cancer Father 57  . Arthritis Maternal Grandmother   . Hypertension Maternal Grandmother   . Depression Maternal Grandmother   . Arthritis Maternal Grandfather   . Stroke Maternal Grandfather   . Hypertension Maternal Grandfather   . Depression Maternal Grandfather   . Hypertension Paternal Grandmother   . Depression Paternal Grandmother   . Hypertension Paternal Grandfather   . Depression Paternal Grandfather   . Leukemia Maternal Aunt        d <50  . Cancer Paternal Aunt        unsure types   Ms. Wauters has 3 daughters and 1 son. She has 2 sisters and a brother, no cancers.  Ms. Backs mother was diagnosed with colon cancer at 54. She has had skin cancers recently and is living at 95. Patient had 3 maternal aunts, 2 maternal uncles. One aunt had leukemia and died under the age of 7. No known cancers in maternal cousins but patient has limited information as they are out of state. Maternal grandmother died at 56, grandfather died at 35. Update 08/27/19: Patient reports one maternal uncle had cancer, unknown type.   Ms. Hair father was diagnosed with prostate cancer at 39 and died at 24 due to heart issues. Patient had 1 paternal uncle, 3 paternal aunts. Two aunts did have cancer over the age of 28 but unsure the type. No known cancers in paternal cousins. Paternal grandmother and  grandfather both died over the age of 54.  Update 07/30/2019: Patient reports one of her aunts had metastatic breast cancer to the pancreas, and one had leukemia.   Ms. Macmullen is unaware of previous family history of genetic testing for hereditary cancer risks. Patient's maternal ancestors are of unknown descent, and paternal ancestors are of [place]  descent. There is no reported Ashkenazi Jewish ancestry. There is no known consanguinity.   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 The remainder of testing was negative/normal.    The STAT Breast cancer panel offered by [facility] includes sequencing and rearrangement analysis for the following 9 genes:  ATM, BRCA1, BRCA2, CDH1, CHEK2, PALB2, PTEN, STK11 and TP53.    The Common Hereditary Cancers Panel + Leukemia panel offered by [facility] includes sequencing and/or deletion duplication testing of the following 54 genes: APC*, ATM*, AXIN2, BARD1, BLM, BMPR1A, BRCA1, BRCA2, BRIP1, CDH1, CDK4, CDKN2A (p14ARF), CDKN2A (p16INK4a), CEBPA, CHEK2, CTNNA1, DICER1*, EPCAM*, GATA2, GREM1*, HOXB13, HRAS, KIT, MEN1*, MLH1*, MSH2*, MSH3*, MSH6*, MUTYH, NBN, NF1*, NTHL1, PALB2, PDGFRA, PMS2*, POLD1*, POLE, PTEN*, RAD50, RAD51C, RAD51D, RUNX1, SDHA*, SDHB, SDHC*, SDHD, SMAD4, SMARCA4, STK11, TERC, TERT, TP53, TSC1*, TSC2, VHL.   The test report has been scanned into EPIC and is located under the Molecular Pathology section of the Results Review tab.  A portion of the result report is included below for reference.    DISCUSSION: CHEK2   We discussed the cancers, inheritance, management asscociated with CHEK2, and the importance of telling family members about this result.   Clinical condition The CHEK2 gene is associated with an increased risk for autosomal dominant adult-onset cancers, including breast, colon, thyroid, prostate, and possibly others (PMID: 24235361, 44315400, 86761950, 93267124, 58099833). The risks of these cancers, particularly breast, have been determined to be both variant- and family history-dependent (PMID: 82505397, 67341937).  Lifetime risks for female breast cancer related to frameshift variants, such as  1100delC, have been estimated to be 25-39% in heterozygotes (PMID: 90240973, 53299242). The risks for most missense variants are unclear, but risks for certain variants (such as p.Ile157Thr) are thought to be lower (PMID: 68341962, 22979892). Per NCCN, absolute risk for breast cancer is 15-40%.   Gene information The CHEK2 gene encodes the CHEK2 enzyme, which helps to ensure accurate DNA repair. In this role, CHEK2 acts as a tumor suppressor by promoting stability of the genome and by preventing tumor formation. The CHEK2 gene is associated with the DNA damage repair response involving the Fanconi anemia-BRCA pathway (PMID: 11941740). A pathogenic variant that disrupts the function of CHEK2 decreases the ability of the cell to protect the integrity of the DNA (PMID: 81448185).  Inheritance Hereditary predisposition to cancer due to pathogenic variants in the CHEK2 gene has autosomal dominant inheritance. This means that an individual with a pathogenic variant has a 50% chance of passing the condition on to his/her offspring. Most cases are inherited from a parent, but some cases may occur spontaneously (i.e., an individual with a pathogenic variant has parents who do not have it). Identification of a pathogenic variant allows for the recognition of at-risk relatives who can pursue testing for the familial variant.  Management:    Breast cancer -Annual mammogram with consideration of breast MRI starting at age 28 -Evidence is insufficient for RRM, manage based on family history  Colon Cancer -For individuals unaffected  by colorectal cancer with a first degree relative with colorectal cancer, begin colonoscopy at age 24 or 1 years younger than relative's age at diagnosis and repeat every 5 years -For individuals unaffected by colorectal cancer and no family history of colorectal cancer, begin colonoscopy at age 24 and repeat every 5 years  These guidelines are based on current NCCN guidelines (NCCN  v.2.2021).  These guidelines are subject to change and continually updated and should be directly referenced for future medical management.     Additional publications underscore the appropriateness of breast MRIs and consideration of chemoprevention (tamoxifen) due to the greater than 20% lifetime risk of breast cancer associated with the 1100delC variant (PMID: 14970263, 78588502).  It has been suggested that men with a CHEK2 pathogenic variant and a first-degree relative with prostate cancer have an annual prostate-specific antigen (PSA) analysis (PMID: 77412878). However, the benefits of screening for prostate cancer among men with a pathogenic variant in CHEK2 are uncertain (PMID: 67672094).  An individual's cancer risk and medical management are not determined by genetic test results alone. Overall cancer risk assessment incorporates additional factors, including personal medical history, family history, and any available genetic information that may result in a personalized plan for cancer prevention and surveillance.  Knowing if a pathogenic CHEK2 variant is present is advantageous. At-risk relatives can be identified, enabling pursuit of a diagnostic evaluation. Information regarding hereditary cancer susceptibility genes is constantly evolving, and more clinically relevant data regarding CHEK2 is likely to become available in the near future. Awareness of this cancer predisposition encourages patients and their providers to inform at-risk family members, to diligently follow condition-specific screening protocols, and to be vigilant in maintaining close and regular contact with their local genetics clinic in anticipation of new information.   FAMILY MEMBERS: It is important that all of Ms. Penagos's relatives (both men and women) know of the presence of this gene mutation. Site-specific genetic testing can sort out who in the family is at risk and who is not.   Ms. Kimery children and siblings  have a 50% chance to have inherited this mutation. We recommend those over 18 have genetic testing for this same mutation, as identifying the presence of this mutation would allow them to also take advantage of risk-reducing measures.   PLAN:   1. These results will be made available to Ms. Szymczak's care team, Dr. Janese Banks and Dr. Windell Moment.   2. Ms. Trenkamp plans to discuss these results with her family and will reach out to [place] if we can be of any assistance in coordinating genetic testing for any of her relatives.    SUPPORT AND RESOURCES: If Ms. Boutelle is interested in CHEK2-specific information and support, there are two groups, Facing Our Risk (www.facingourrisk.com) and Bright Pink (www.brightpink.org) which some people have found useful. They provide opportunities to speak with other individuals from high-risk families. To locate genetic counselors in other cities, visit the website of the [employer] (ArtistMovie.se) and Secretary/administrator for a Social worker by zip code.  We encouraged Ms. Solar to remain in contact with [place] on an annual basis so we can update her personal and family histories, and let her know of advances in cancer genetics that may benefit the family. Our contact number was provided. Ms. Salmela questions were answered to her satisfaction today, and she knows she is welcome to call anytime with additional questions.   Faith Rogue, MS, [facility] Genetic Counselor [email or web address] Phone: [phone]

## 2019-08-10 NOTE — Telephone Encounter (Signed)
Called Ms. Stech with the remainder of genetic test results. The rest of the results were normal. Discussed CHEK2 mutation in more detail and answered questions patient had about family members getting tested.

## 2019-08-14 ENCOUNTER — Ambulatory Visit: Payer: Self-pay | Admitting: General Surgery

## 2019-08-14 NOTE — H&P (Signed)
HISTORY OF PRESENT ILLNESS:    Tiffany Acevedo Acevedo patient who comes for preoperative evaluation and discussion of surgical management of her breast cancer.  TiffanySmithreports how her usual screening mammogram. In the screening mammogram calcifications were seen. These led to diagnostic mammogram. Diagnostic mammogram shows excision of calcification from one side of the breast to the other. At least a span of 6 cm of the calcifications. Stereotactic core needle biopsy was done on the 2 extremes. The medial biopsy showed invasive mammary carcinoma. The lateral biopsy showed fibrocystic changes without atypia or malignancy. I personally evaluated the images and I discussed it personally with the radiologist regarding the calcifications in between the 2 biopsies.  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  The remainder of testing was negative.  Breast cancer panel was negative.  Patient reports improved with pain on the lateral aspect with decreasing size hematoma. Otherwise she is doing well. She denies any previous palpable breast mass or skin changes. She denies any nipple discharge.  Family history of breast cancer:None Family history of other cancers:Mother colorectal cancer, father prostate Menarche:63 years old Menopause:N/A Used OCP:Yes Used estrogen and progesterone therapy:None History of Radiation to the chest:No Previous breast biopsy: None      PAST MEDICAL HISTORY:      Past Medical History:  Diagnosis Date  . History of cancer Now  . Hypertension   . Kidney stones   . Migraines         PAST SURGICAL HISTORY:        Past Surgical History:  Procedure Laterality Date  . breast bx (right) Right 07/13/2019  . CESAREAN SECTION, LOW TRANSVERSE  04/09/1992  . CESAREAN SECTION, LOW TRANSVERSE  01/08/2003  . CESAREAN SECTION, LOW TRANSVERSE  03/18/2010  . COLONOSCOPY  03/22/2013    Dr. Kurtis Bushman @ [place], [facility](m), rpt 5 yrs per [facility]  . COLONOSCOPY  07/25/2007   Dr. Kurtis Bushman @ [facility] - [facility](m)  . TENSION-FREE VAGINAL TAPE  08/16/2003  . TUBAL LIGATION  01/08/2003         MEDICATIONS:  Encounter Medications        Outpatient Encounter Medications as of 08/14/2019  Medication Sig Dispense Refill  . butalbital-aspirin-caffeine (FIORINAL) 50-325-40 mg tablet Take 1 tablet by mouth every 4 (four) hours as needed for Pain for up to 10 doses. 10 tablet 3  . galcanezumab-gnlm (EMGALITY PEN) 120 mg/mL PnIj Inject subcutaneously    . hydroCHLOROthiazide (MICROZIDE) 12.5 mg capsule Take 1 capsule (12.5 mg total) by mouth once daily 90 capsule 1  . ibuprofen (ADVIL,MOTRIN) 800 MG tablet Take 800 mg by mouth every 6 (six) hours as needed for Pain.    Marland Kitchen imipramine (TOFRANIL) 50 MG tablet Take 1 tablet (50 mg total) by mouth nightly 30 tablet 2  . metoprolol succinate (TOPROL-XL) 100 MG XL tablet Take 1 tablet (100 mg total) by mouth once daily for 90 days 90 tablet 3  . multivitamin tablet Take by mouth    . omeprazole (PRILOSEC) 40 MG DR capsule     . ondansetron (ZOFRAN) 4 MG tablet Take 1 tablet (4 mg total) by mouth once as needed for Nausea for up to 1 dose. 20 tablet 5  . rizatriptan (MAXALT) 10 MG tablet TAKE 1 TABLET BY MOUTH ONCE AS NEEDED FOR MIGRAINE FOR UP TO 1 DOSE. MAY TAKE SECOND DOSE AFTER 2 HOURS IF NEEDED. 10 tablet  8  . venlafaxine (EFFEXOR-XR) 150 MG XR capsule Take 1 capsule (150 mg total) by mouth once daily 90 capsule 1  . cephalexin (KEFLEX) 500 MG capsule as directed (Patient not taking: Reported on 08/14/2019  )    . cyclobenzaprine (FLEXERIL) 10 MG tablet Take 1 tablet (10 mg total) by mouth 2 (two) times daily as needed (pain). 60 tablet 3  . diazePAM (VALIUM) 2 MG tablet as directed (Patient not taking: Reported on 08/14/2019  )    . HYDROcodone-acetaminophen (NORCO) 5-325 mg tablet as directed (Patient not taking:  Reported on 08/14/2019  )    . [DISCONTINUED] imipramine (TOFRANIL) 50 MG tablet Take 1 tablet (50 mg total) by mouth nightly for 90 days (Patient not taking: Reported on 08/14/2019  ) 90 tablet 3   No facility-administered encounter medications on file as of 08/14/2019.       ALLERGIES:   Oxycodone-acetaminophen and Sulfa (sulfonamide antibiotics)   SOCIAL HISTORY:  Social History          Socioeconomic History  . Marital status: Married    Spouse name: Not on file  . Number of children: Not on file  . Years of education: Not on file  . Highest education level: Not on file  Occupational History  . Not on file  Tobacco Use  . Smoking status: Never Smoker  . Smokeless tobacco: Never Used  Vaping Use  . Vaping Use: Never used  Substance and Sexual Activity  . Alcohol use: Yes    Alcohol/week: 0.0 standard drinks    Comment: Occasional glass of wine  . Drug use: No  . Sexual activity: Yes    Partners: Male    Birth control/protection: Surgical    Comment: tubal  Other Topics Concern  . Not on file  Social History Narrative  . Not on file   Social Determinants of Health      Financial Resource Strain:   . Difficulty of Paying Living Expenses:   Food Insecurity:   . Worried About Charity fundraiser in the Last Year:   . Arboriculturist in the Last Year:   Transportation Needs:   . Film/video editor (Medical):   Marland Kitchen Lack of Transportation (Non-Medical):       FAMILY HISTORY:       Family History  Problem Relation Age of Onset  . Colon cancer Mother 41  . Heart disease Father   . High blood pressure (Hypertension) Father   . Stroke Father   . Prostate cancer Father   . High blood pressure (Hypertension) Sister   . Heart disease Sister   . Migraines Daughter   . No Known Problems Brother   . Diabetes type II Maternal Grandmother   . No Known Problems Maternal Grandfather   . No Known Problems Paternal Grandmother   . No  Known Problems Paternal Grandfather   . No Known Problems Sister      GENERAL REVIEW OF SYSTEMS:   General ROS: negative for - chills, fatigue, fever, weight gain or weight loss Allergy and Immunology ROS: negative for - hives  Hematological and Lymphatic ROS: negative for - bleeding problems or bruising, negative for palpable nodes Endocrine ROS: negative for - heat or cold intolerance, hair changes Respiratory ROS: negative for - cough, shortness of breath or wheezing Cardiovascular ROS: no chest pain or palpitations GI ROS: negative for nausea, vomiting, abdominal pain, diarrhea, constipation Musculoskeletal ROS: negative for - joint swelling or muscle pain  Neurological ROS: negative for - confusion, syncope Dermatological ROS: negative for pruritus and rash  PHYSICAL EXAM:     Vitals:   08/14/19 1047  BP: (!) 89/62  Pulse: 69  .  Ht:162.6 cm (5' 4") Wt:66.2 kg (146 lb) ZYS:AYTK surface area is 1.73 meters squared. Body mass index is 25.06 kg/m.Marland Kitchen   GENERAL: Alert, active, oriented x3  HEENT: Pupils equal reactive to light. Extraocular movements are intact. Sclera clear. Palpebral conjunctiva normal red color.Pharynx clear.  NECK: Supple with no palpable mass and no adenopathy.  LUNGS: Sound clear with no rales rhonchi or wheezes.  HEART: Regular rhythm S1 and S2 without murmur.  BREAST: Bilateral breast examined on the supine position.  Right breast with smaller hematoma on the lateral aspect.  There is no palpable hematoma on the medial aspect.  There is no palpable mass, skin change or nipple retraction on the left breast.  ABDOMEN: Soft and depressible, nontender with no palpable mass, no hepatomegaly.   EXTREMITIES: Well-developed well-nourished symmetrical with no dependent edema.  NEUROLOGICAL: Awake alert oriented, facial expression symmetrical, moving all extremities.      IMPRESSION:     Malignant neoplasm of upper-inner quadrant of right  female breast, unspecified estrogen receptor status (CMS-HCC) [C50.211]  Patient with invasive mammary carcinoma of the right breast, ER/PR positive, HER-2 negative.  Due to concerning calcifications of the breast that extended from 1 side of the breast to the other MRI of the breast was done without any further concerning areas.  Due to the positive genetic testing of the CHEK2 gene the patient has elected to proceed with bilateral mastectomies.  I personally evaluated the images of the MRI and the genetic panel results.  Patient has already been evaluated by plastic and reconstructive surgery for discussion of immediate reconstruction after bilateral total mastectomy.  The patient understand the risk of bilateral mastectomy.  I discussed with the patient the surgery implications and the risk including hematoma, seroma, infection, pain, numbness, contracture of the muscle, lymphedema, among others.  The patient and her husband report understood and agreed to proceed with surgery.          PLAN:  1.  We discussed about bilateral mastectomy with immediate reconstruction and sentinel node biopsy on the right side. (19301, 38525) 2. Avoid taking aspirin 5 days before surgery 3. Contact us if has any concern.   Patient verbalized understanding, all questions were answered, and were agreeable with the plan outlined above.   I spent a total of 50 minutes in both face-to-face and non-face-to-face activities for this visit on the date of this encounter.  Herbert Pun, MD  Electronically signed by Herbert Pun, MD

## 2019-08-14 NOTE — H&P (View-Only) (Signed)
HISTORY OF PRESENT ILLNESS:    Tiffany Acevedo is a 54 y.o.female patient who comes for preoperative evaluation and discussion of surgical management of her breast cancer.  TiffanySmithreports how her usual screening mammogram. In the screening mammogram calcifications were seen. These led to diagnostic mammogram. Diagnostic mammogram shows excision of calcification from one side of the breast to the other. At least a span of 6 cm of the calcifications. Stereotactic core needle biopsy was done on the 2 extremes. The medial biopsy showed invasive mammary carcinoma. The lateral biopsy showed fibrocystic changes without atypia or malignancy. I personally evaluated the images and I discussed it personally with the radiologist regarding the calcifications in between the 2 biopsies.  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  The remainder of testing was negative.  Breast cancer panel was negative.  Patient reports improved with pain on the lateral aspect with decreasing size hematoma. Otherwise she is doing well. She denies any previous palpable breast mass or skin changes. She denies any nipple discharge.  Family history of breast cancer:None Family history of other cancers:Mother colorectal cancer, father prostate Menarche:32 years old Menopause:N/A Used OCP:Yes Used estrogen and progesterone therapy:None History of Radiation to the chest:No Previous breast biopsy: None      PAST MEDICAL HISTORY:      Past Medical History:  Diagnosis Date  . History of cancer Now  . Hypertension   . Kidney stones   . Migraines         PAST SURGICAL HISTORY:        Past Surgical History:  Procedure Laterality Date  . breast bx (right) Right 07/13/2019  . CESAREAN SECTION, LOW TRANSVERSE  04/09/1992  . CESAREAN SECTION, LOW TRANSVERSE  01/08/2003  . CESAREAN SECTION, LOW TRANSVERSE  03/18/2010  . COLONOSCOPY  03/22/2013    Dr. Kurtis Bushman @ [place], [facility](m), rpt 5 yrs per [facility]  . COLONOSCOPY  07/25/2007   Dr. Kurtis Bushman @ [facility] - [facility](m)  . TENSION-FREE VAGINAL TAPE  08/16/2003  . TUBAL LIGATION  01/08/2003         MEDICATIONS:  Encounter Medications        Outpatient Encounter Medications as of 08/14/2019  Medication Sig Dispense Refill  . butalbital-aspirin-caffeine (FIORINAL) 50-325-40 mg tablet Take 1 tablet by mouth every 4 (four) hours as needed for Pain for up to 10 doses. 10 tablet 3  . galcanezumab-gnlm (EMGALITY PEN) 120 mg/mL PnIj Inject subcutaneously    . hydroCHLOROthiazide (MICROZIDE) 12.5 mg capsule Take 1 capsule (12.5 mg total) by mouth once daily 90 capsule 1  . ibuprofen (ADVIL,MOTRIN) 800 MG tablet Take 800 mg by mouth every 6 (six) hours as needed for Pain.    Marland Kitchen imipramine (TOFRANIL) 50 MG tablet Take 1 tablet (50 mg total) by mouth nightly 30 tablet 2  . metoprolol succinate (TOPROL-XL) 100 MG XL tablet Take 1 tablet (100 mg total) by mouth once daily for 90 days 90 tablet 3  . multivitamin tablet Take by mouth    . omeprazole (PRILOSEC) 40 MG DR capsule     . ondansetron (ZOFRAN) 4 MG tablet Take 1 tablet (4 mg total) by mouth once as needed for Nausea for up to 1 dose. 20 tablet 5  . rizatriptan (MAXALT) 10 MG tablet TAKE 1 TABLET BY MOUTH ONCE AS NEEDED FOR MIGRAINE FOR UP TO 1 DOSE. MAY TAKE SECOND DOSE AFTER 2 HOURS IF NEEDED. 10 tablet  8  . venlafaxine (EFFEXOR-XR) 150 MG XR capsule Take 1 capsule (150 mg total) by mouth once daily 90 capsule 1  . cephalexin (KEFLEX) 500 MG capsule as directed (Patient not taking: Reported on 08/14/2019  )    . cyclobenzaprine (FLEXERIL) 10 MG tablet Take 1 tablet (10 mg total) by mouth 2 (two) times daily as needed (pain). 60 tablet 3  . diazePAM (VALIUM) 2 MG tablet as directed (Patient not taking: Reported on 08/14/2019  )    . HYDROcodone-acetaminophen (NORCO) 5-325 mg tablet as directed (Patient not taking:  Reported on 08/14/2019  )    . [DISCONTINUED] imipramine (TOFRANIL) 50 MG tablet Take 1 tablet (50 mg total) by mouth nightly for 90 days (Patient not taking: Reported on 08/14/2019  ) 90 tablet 3   No facility-administered encounter medications on file as of 08/14/2019.       ALLERGIES:   Oxycodone-acetaminophen and Sulfa (sulfonamide antibiotics)   SOCIAL HISTORY:  Social History          Socioeconomic History  . Marital status: Married    Spouse name: Not on file  . Number of children: Not on file  . Years of education: Not on file  . Highest education level: Not on file  Occupational History  . Not on file  Tobacco Use  . Smoking status: Never Smoker  . Smokeless tobacco: Never Used  Vaping Use  . Vaping Use: Never used  Substance and Sexual Activity  . Alcohol use: Yes    Alcohol/week: 0.0 standard drinks    Comment: Occasional glass of wine  . Drug use: No  . Sexual activity: Yes    Partners: Male    Birth control/protection: Surgical    Comment: tubal  Other Topics Concern  . Not on file  Social History Narrative  . Not on file   Social Determinants of Health      Financial Resource Strain:   . Difficulty of Paying Living Expenses:   Food Insecurity:   . Worried About Charity fundraiser in the Last Year:   . Arboriculturist in the Last Year:   Transportation Needs:   . Film/video editor (Medical):   Marland Kitchen Lack of Transportation (Non-Medical):       FAMILY HISTORY:       Family History  Problem Relation Age of Onset  . Colon cancer Mother 73  . Heart disease Father   . High blood pressure (Hypertension) Father   . Stroke Father   . Prostate cancer Father   . High blood pressure (Hypertension) Sister   . Heart disease Sister   . Migraines Daughter   . No Known Problems Brother   . Diabetes type II Maternal Grandmother   . No Known Problems Maternal Grandfather   . No Known Problems Paternal Grandmother   . No  Known Problems Paternal Grandfather   . No Known Problems Sister      GENERAL REVIEW OF SYSTEMS:   General ROS: negative for - chills, fatigue, fever, weight gain or weight loss Allergy and Immunology ROS: negative for - hives  Hematological and Lymphatic ROS: negative for - bleeding problems or bruising, negative for palpable nodes Endocrine ROS: negative for - heat or cold intolerance, hair changes Respiratory ROS: negative for - cough, shortness of breath or wheezing Cardiovascular ROS: no chest pain or palpitations GI ROS: negative for nausea, vomiting, abdominal pain, diarrhea, constipation Musculoskeletal ROS: negative for - joint swelling or muscle pain  Neurological ROS: negative for - confusion, syncope Dermatological ROS: negative for pruritus and rash  PHYSICAL EXAM:     Vitals:   08/14/19 1047  BP: (!) 89/62  Pulse: 69  .  Ht:162.6 cm (5' 4") Wt:66.2 kg (146 lb) ZYS:AYTK surface area is 1.73 meters squared. Body mass index is 25.06 kg/m.Marland Kitchen   GENERAL: Alert, active, oriented x3  HEENT: Pupils equal reactive to light. Extraocular movements are intact. Sclera clear. Palpebral conjunctiva normal red color.Pharynx clear.  NECK: Supple with no palpable mass and no adenopathy.  LUNGS: Sound clear with no rales rhonchi or wheezes.  HEART: Regular rhythm S1 and S2 without murmur.  BREAST: Bilateral breast examined on the supine position.  Right breast with smaller hematoma on the lateral aspect.  There is no palpable hematoma on the medial aspect.  There is no palpable mass, skin change or nipple retraction on the left breast.  ABDOMEN: Soft and depressible, nontender with no palpable mass, no hepatomegaly.   EXTREMITIES: Well-developed well-nourished symmetrical with no dependent edema.  NEUROLOGICAL: Awake alert oriented, facial expression symmetrical, moving all extremities.      IMPRESSION:     Malignant neoplasm of upper-inner quadrant of right  female breast, unspecified estrogen receptor status (CMS-HCC) [C50.211]  Patient with invasive mammary carcinoma of the right breast, ER/PR positive, HER-2 negative.  Due to concerning calcifications of the breast that extended from 1 side of the breast to the other MRI of the breast was done without any further concerning areas.  Due to the positive genetic testing of the CHEK2 gene the patient has elected to proceed with bilateral mastectomies.  I personally evaluated the images of the MRI and the genetic panel results.  Patient has already been evaluated by plastic and reconstructive surgery for discussion of immediate reconstruction after bilateral total mastectomy.  The patient understand the risk of bilateral mastectomy.  I discussed with the patient the surgery implications and the risk including hematoma, seroma, infection, pain, numbness, contracture of the muscle, lymphedema, among others.  The patient and her husband report understood and agreed to proceed with surgery.          PLAN:  1.  We discussed about bilateral mastectomy with immediate reconstruction and sentinel node biopsy on the right side. (19301, [id number]) 2. Avoid taking aspirin 5 days before surgery 3. Contact us if has any concern.   Patient verbalized understanding, all questions were answered, and were agreeable with the plan outlined above.   I spent a total of 50 minutes in both face-to-face and non-face-to-face activities for this visit on the date of this encounter.  Herbert Pun, MD  Electronically signed by Herbert Pun, MD

## 2019-08-19 NOTE — Progress Notes (Signed)
I connected with Tiffany Acevedo on 08/19/19 at  1:00 PM EDT by video enabled telemedicine visit and verified that I am speaking with the correct person using two identifiers.   I discussed the limitations, risks, security and privacy concerns of performing an evaluation and management service by telemedicine and the availability of in-person appointments. I also discussed with the patient that there may be a patient responsible charge related to this service. The patient expressed understanding and agreed to proceed.  Other persons participating in the visit and their role in the encounter:  none  Patient's location:  home Provider's location:  home  Chief Complaint:  Discuss genetic testing results and further management  History of present illness: Patient is a 54 year old female with past medical history is significant for anxiety disorder who recently underwent a screening mammogram on 06/18/2019 which showed suspicious calcifications in the right breast.  This was followed by diagnostic mammogram which showed numerous groups of calcifications spanning at least 6.4 cm.  She had a biopsy of the medialmost and lateralmost calcification.  The medialmost calcification showed invasive mammary carcinoma grade 1 ER/PR positive and HER-2 negative.  The lateralmost calcification was negative for atypia and malignancy and showed fibrocystic changes.  Patient has met with Dr. Peyton Najjar and is leaning towards at least a unilateral mastectomy.  She also underwent bilateral MRI which did not show any enhancement even at the site of biopsy-proven malignancy or elsewhere.  No MRI evidence of malignancy in the left breast.  Patient scheduled to undergo bilateral mastectomy with reconstruction on 08/14/19  Interval history: Patient reports feeling well. Denies any complaints at this time   Review of Systems  Constitutional: Negative for chills, fever, malaise/fatigue and weight loss.  HENT: Negative for congestion,  ear discharge and nosebleeds.   Eyes: Negative for blurred vision.  Respiratory: Negative for cough, hemoptysis, sputum production, shortness of breath and wheezing.   Cardiovascular: Negative for chest pain, palpitations, orthopnea and claudication.  Gastrointestinal: Negative for abdominal pain, blood in stool, constipation, diarrhea, heartburn, melena, nausea and vomiting.  Genitourinary: Negative for dysuria, flank pain, frequency, hematuria and urgency.  Musculoskeletal: Negative for back pain, joint pain and myalgias.  Skin: Negative for rash.  Neurological: Negative for dizziness, tingling, focal weakness, seizures, weakness and headaches.  Endo/Heme/Allergies: Does not bruise/bleed easily.  Psychiatric/Behavioral: Negative for depression and suicidal ideas. The patient does not have insomnia.     Allergies  Allergen Reactions  . Percocet [Oxycodone-Acetaminophen] Other (See Comments)    Arms and legs go numb  . Sulfa Antibiotics Nausea And Vomiting    Past Medical History:  Diagnosis Date  . Allergy   . Anxiety   . Arthritis   . Depression   . Excessive weight gain 06/30/2016  . Family history of colon cancer   . Family history of colon cancer   . Family history of prostate cancer   . Frequent headaches   . GERD (gastroesophageal reflux disease)   . History of kidney stones   . Hypertension   . Migraines   . Pre-diabetes     Past Surgical History:  Procedure Laterality Date  . BREAST BIOPSY Right 07/13/2019   stereo biopsy/ x clip/path pending  . BREAST BIOPSY Right 07/13/2019   stereo biopsy/coil clip/ path pending  . [place], 2004, 2013  . COLONOSCOPY  2015  . CYSTOSCOPY W/ RETROGRADES Right 09/13/2018   Procedure: CYSTOSCOPY WITH RETROGRADE PYELOGRAM;  Surgeon: Abbie Sons, MD;  Location: [facility];  Service: Urology;  Laterality: Right;  . CYSTOSCOPY/URETEROSCOPY/HOLMIUM LASER/STENT PLACEMENT Left 05/09/2018   Procedure:  CYSTOSCOPY/URETEROSCOPY/HOLMIUM LASER/STENT PLACEMENT;  Surgeon: Abbie Sons, MD;  Location: [facility];  Service: Urology;  Laterality: Left;  . CYSTOSCOPY/URETEROSCOPY/HOLMIUM LASER/STENT PLACEMENT Right 09/13/2018   Procedure: CYSTOSCOPY/URETEROSCOPY/HOLMIUM LASER/STENT PLACEMENT;  Surgeon: Abbie Sons, MD;  Location: [facility];  Service: Urology;  Laterality: Right;  . INCONTINENCE SURGERY     BLADDER TUCK  . PILONIDAL CYST EXCISION    . STONE EXTRACTION WITH BASKET Right 09/13/2018   Procedure: STONE EXTRACTION WITH BASKET;  Surgeon: Abbie Sons, MD;  Location: [facility];  Service: Urology;  Laterality: Right;  . UPPER GI ENDOSCOPY      Social History   Socioeconomic History  . Marital status: Married    Spouse name: Not on file  . Number of children: Not on file  . Years of education: Not on file  . Highest education level: Not on file  Occupational History  . Not on file  Tobacco Use  . Smoking status: Never Smoker  . Smokeless tobacco: Never Used  Vaping Use  . Vaping Use: Never used  Substance and Sexual Activity  . Alcohol use: Yes    Comment: glass of wine occassionally  . Drug use: No  . Sexual activity: Yes    Partners: Male  Other Topics Concern  . Not on file  Social History Narrative  . Not on file   Social Determinants of Health   Financial Resource Strain:   . Difficulty of Paying Living Expenses:   Food Insecurity:   . Worried About Charity fundraiser in the Last Year:   . Arboriculturist in the Last Year:   Transportation Needs:   . Film/video editor (Medical):   Marland Kitchen Lack of Transportation (Non-Medical):   Physical Activity:   . Days of Exercise per Week:   . Minutes of Exercise per Session:   Stress:   . Feeling of Stress :   Social Connections:   . Frequency of Communication with Friends and Family:   . Frequency of Social Gatherings with Friends and Family:   . Attends Religious Services:   . Active Member of Clubs or  Organizations:   . Attends Archivist Meetings:   Marland Kitchen Marital Status:   Intimate Partner Violence:   . Fear of Current or Ex-Partner:   . Emotionally Abused:   Marland Kitchen Physically Abused:   . Sexually Abused:     Family History  Problem Relation Age of Onset  . Hyperlipidemia Mother   . Hypertension Mother   . Depression Mother   . Colon cancer Mother 85  . Alcohol abuse Father   . Hyperlipidemia Father   . Stroke Father   . Hypertension Father   . Depression Father   . Heart disease Father   . Prostate cancer Father 52  . Arthritis Maternal Grandmother   . Hypertension Maternal Grandmother   . Depression Maternal Grandmother   . Arthritis Maternal Grandfather   . Stroke Maternal Grandfather   . Hypertension Maternal Grandfather   . Depression Maternal Grandfather   . Hypertension Paternal Grandmother   . Depression Paternal Grandmother   . Hypertension Paternal Grandfather   . Depression Paternal Grandfather   . Leukemia Maternal Aunt        d <50  . Cancer Paternal Aunt        unsure types     Current Outpatient Medications:  .  diazepam (VALIUM) 2 MG tablet, Take 1 tablet (2 mg total) by mouth every 12 (twelve) hours as needed for muscle spasms., Disp: 20 tablet, Rfl: 0 .  EMGALITY 120 MG/ML SOAJ, Inject 120 mg into the skin every 28 (twenty-eight) days., Disp: , Rfl:  .  hydrochlorothiazide (MICROZIDE) 12.5 MG capsule, Take 1 capsule (12.5 mg total) by mouth daily., Disp: 30 capsule, Rfl: 2 .  ibuprofen (ADVIL,MOTRIN) 200 MG tablet, Take 400 mg by mouth every 6 (six) hours as needed for headache or moderate pain. , Disp: , Rfl:  .  imipramine (TOFRANIL) 50 MG tablet, Take 50 mg by mouth at bedtime. , Disp: , Rfl:  .  metoprolol succinate (TOPROL-XL) 100 MG 24 hr tablet, Take 100 mg by mouth at bedtime. , Disp: , Rfl:  .  Multiple Vitamin (MULTIVITAMIN PO), Take 1 tablet by mouth daily., Disp: , Rfl:  .  omeprazole (PRILOSEC) 20 MG capsule, Take 20 mg by mouth  daily. , Disp: , Rfl:  .  ondansetron (ZOFRAN) 4 MG tablet, Take 1 tablet (4 mg total) by mouth every 8 (eight) hours as needed for nausea or vomiting., Disp: 8 tablet, Rfl: 0 .  ondansetron (ZOFRAN) 4 MG tablet, Take 1 tablet (4 mg total) by mouth every 8 (eight) hours as needed for nausea or vomiting., Disp: 20 tablet, Rfl: 0 .  rizatriptan (MAXALT) 10 MG tablet, Take 10 mg by mouth as needed for migraine. , Disp: , Rfl:  .  venlafaxine XR (EFFEXOR-XR) 150 MG 24 hr capsule, TAKE 1 CAPSULE BY MOUTH EVERY DAY WITH BREAKFAST (Patient taking differently: Take 150 mg by mouth daily with breakfast. ), Disp: 30 capsule, Rfl: 5  Current Facility-Administered Medications:  .  ciprofloxacin (CIPRO) tablet 500 mg, 500 mg, Oral, Once, Stoioff, Scott C, MD .  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 Patient also had stereotactic guided biopsy of calcifications in the lateral right breast with benign concordant pathology demonstrating fibroadenomatoid and fibrocystic changes with associated calcifications. LABS:  Not applicable. EXAM: BILATERAL BREAST MRI WITH AND WITHOUT CONTRAST TECHNIQUE: Multiplanar, multisequence MR images of both breasts were obtained prior to and following the intravenous administration of 6 ml of Gadavist Three-dimensional MR images were rendered by post-processing of the original MR data on an independent workstation. The three-dimensional MR images were interpreted, and findings are reported in the following complete MRI report for this study. Three dimensional images were evaluated at the independent DynaCad  workstation COMPARISON:  Previous exams. FINDINGS: Breast composition: d.  Extreme fibroglandular tissue. Background parenchymal enhancement: There is marked background parenchymal enhancement of the bilateral breasts which decreases the sensitivity for detection of malignancy. Right breast: Biopsied related changes with moderate sized hematoma in the upper-outer right breast at site of prior benign biopsy. There are also biopsy related changes with a small hematoma in the inner right breast at site of biopsy proven malignancy. There is no abnormal enhancement at the site of biopsy proven malignancy in the inner right breast or elsewhere in the right breast to suggest additional sites of disease. Left breast: No mass or abnormal enhancement. Lymph nodes: No morphologically abnormal axillary lymph nodes. No internal mammary  lymphadenopathy. Ancillary findings:  None. IMPRESSION: 1. Small hematoma and biopsy related changes at site of biopsy proven malignancy in the inner right breast, however no abnormal enhancement or enhancing masses at the site of biopsy proven malignancy or elsewhere in the right breast. 2.  No MRI evidence of malignancy in the left breast. RECOMMENDATION: Treatment plan for known right breast malignancy. BI-RADS CATEGORY  6: Known biopsy-proven malignancy. Electronically Signed   By: Everlean Alstrom M.D.   On: 07/26/2019 09:32    No images are attached to the encounter.   CMP Latest Ref Rng & Units 09/14/2018  Glucose 70 - 99 mg/dL 124(H)  BUN 6 - 20 mg/dL 20  Creatinine 0.44 - 1.00 mg/dL 0.98  Sodium 135 - 145 mmol/L 141  Potassium 3.5 - 5.1 mmol/L 3.7  Chloride 98 - 111 mmol/L 106  CO2 22 - 32 mmol/L 25  Calcium 8.9 - 10.3 mg/dL 9.5  Total Protein 6.5 - 8.1 g/dL 7.6  Total Bilirubin 0.3 - 1.2 mg/dL 0.5  Alkaline Phos 38 - 126 U/L 75  AST 15 - 41 U/L 26  ALT 0 - 44 U/L 36   CBC Latest Ref Rng & Units 09/14/2018  WBC 4.0 - 10.5 K/uL 17.7(H)  Hemoglobin 12.0 - 15.0 g/dL 12.8   Hematocrit 36 - 46 % 38.9  Platelets 150 - 400 K/uL 257     Observation/objective: Appears in no acute distress over video visit today.  Breathing is nonlabored   Assessment and plan: Patient is a 54 year old female with newly diagnosed invasive mammary carcinoma Of the right breast ER/PR positive and HER-2/neu negative.  This is a visit to discuss genetic results and further management   Patient was found to have 6.4 cm span of calcifications.  1 end of which was positive for invasive mammary carcinoma ER/PR positive and HER-2 negative and the other end was consistent with fibroadenomatoid/fibrocystic changes.  These calcifications were not well visualized on MRI.  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  She has also met up with genetic counseling Nancy Fetter.  I explained to the patient that CHEK2 mutation get years have a higher likelihood of ER positive cancers as well as second primary breast cancers as compared to general population.  Odds ratio was 2.6.  Positive disease versus 1.4-1.6 for ER negative disease.    Discussed with her that chek2 does not necessarily mean that she needs to go for mastectomy.  However if she does not opt for mastectomy she would need mammograms alternating with MRIs.  There is also an increased risk of colorectal cancer associated with this gene with a cumulative lifetime risk to age 73 is about 12% as compared to 6% for general population.  Patient is also scheduled to undergo colonoscopy in the near future.  Patient is now leaning towards a bilateral mastectomy with reconstruction given the results of genetic testing.  Roney Marion and will also be giving her information about getting her family members tested which will be done for free within 150 days of her positive result.   Discussed with her that CHEK2 mutation positivity does not influence the decision for adjuvant chemotherapy.  Also she will need  hormone therapy regardless given that she has ER positive disease.  She may not need postmastectomy radiation however.  Treatment will be given with a curative intent   Follow-up instructions: I will see her post mastectomy to discuss her final pathology results and further  management.   I discussed the assessment and treatment plan with the patient. The patient was provided an opportunity to ask questions and all were answered. The patient agreed with the plan and demonstrated an understanding of the instructions.   The patient was advised to call back or seek an in-person evaluation if the symptoms worsen or if the condition fails to improve as anticipated.   Visit Diagnosis: 1. Goals of care, counseling/discussion   2. Malignant neoplasm of upper-outer quadrant of right breast in female, estrogen receptor positive ([place])   3. Monoallelic mutation of CHEK2 gene in female patient     Dr. Randa Evens, MD, MPH [facility] Tel- [phone] 08/19/2019 7:37 PM

## 2019-08-20 ENCOUNTER — Other Ambulatory Visit
Admission: RE | Admit: 2019-08-20 | Discharge: 2019-08-20 | Disposition: A | Discharge status: Home / Self Care | Payer: BC Managed Care – PPO | Source: Ambulatory Visit | Attending: General Surgery | Admitting: General Surgery

## 2019-08-20 ENCOUNTER — Other Ambulatory Visit: Payer: Self-pay

## 2019-08-20 NOTE — Patient Instructions (Addendum)
Your procedure is scheduled on: Monday August 27, 2019. Report to Day Surgery inside [facility] 2nd floor. To find out your arrival time please call [phone] between 1PM - 3PM on Friday August 24, 2019.  Remember: Instructions that are not followed completely may result in serious medical risk,  up to and including death, or upon the discretion of your surgeon and anesthesiologist your  surgery may need to be rescheduled.     _X__ 1. Do not eat food after midnight the night before your procedure.                 No gum chewing or hard candies. You may drink clear liquids up to 2 hours                 before you are scheduled to arrive for your surgery- DO not drink clear                 liquids within 2 hours of the start of your surgery.                 Clear Liquids include:  water, apple juice without pulp, clear Gatorade, G2 or                  Gatorade Zero (avoid Red/Purple/Blue), Black Coffee or Tea (Do not add                 anything to coffee or tea).  __X__2.  On the morning of surgery brush your teeth with toothpaste and water, you                may rinse your mouth with mouthwash if you wish.  Do not swallow any toothpaste of mouthwash.     _X__ 3.  No Alcohol for 24 hours before or after surgery.   _X__ 4.  Do Not Smoke or use e-cigarettes For 24 Hours Prior to Your Surgery.                 Do not use any chewable tobacco products for at least 6 hours prior to                 Surgery.  _X__  5.  Do not use any recreational drugs (marijuana, cocaine, heroin, ecstacy, MDMA or other)                For at least one week prior to your surgery.  Combination of these drugs with anesthesia                May have life threatening results.  __X__ 6.  Notify your doctor if there is any change in your medical condition      (cold, fever, infections).     Do not wear jewelry, make-up, hairpins, clips or nail polish. Do not wear lotions, powders,  perfumes or deodorants . Do not shave 48 hours prior to surgery. Men may shave face and neck. Do not bring valuables to the hospital.    [facility] is not responsible for any belongings or valuables.  Contacts, dentures or bridgework may not be worn into surgery. Leave your suitcase in the car. After surgery it may be brought to your room. For patients admitted to the hospital, discharge time is determined by your treatment team.   Patients discharged the day of surgery will not be allowed to drive home.   Make arrangements for someone to be with you  for the first 24 hours of your Same Day Discharge.    __X__ Take these medicines the morning of surgery with A SIP OF WATER:    1. omeprazole (PRILOSEC) 20 MG  2. venlafaxine XR (EFFEXOR-XR) 150 MG   __X__ Use CHG Soap as directed  __X__ Stop Anti-inflammatories such as Ibuprofen, Aleve, naproxen, Advil, aspirin and or BC powders.   __X__ Stop supplements until after surgery.    __X__ Do not start any herbal supplements before your surgery.

## 2019-08-24 ENCOUNTER — Other Ambulatory Visit: Payer: Self-pay

## 2019-08-24 ENCOUNTER — Other Ambulatory Visit: Payer: BC Managed Care – PPO

## 2019-08-24 ENCOUNTER — Encounter
Admission: RE | Admit: 2019-08-24 | Discharge: 2019-08-24 | Disposition: A | Discharge status: Home / Self Care | Payer: BC Managed Care – PPO | Source: Ambulatory Visit | Attending: General Surgery | Admitting: General Surgery

## 2019-08-24 DIAGNOSIS — Z20822 Contact with and (suspected) exposure to covid-19: Secondary | ICD-10-CM | POA: Insufficient documentation

## 2019-08-24 DIAGNOSIS — Z01812 Encounter for preprocedural laboratory examination: Secondary | ICD-10-CM | POA: Diagnosis not present

## 2019-08-24 DIAGNOSIS — I10 Essential (primary) hypertension: Secondary | ICD-10-CM | POA: Diagnosis not present

## 2019-08-24 LAB — SARS CORONAVIRUS 2 (TAT 6-24 HRS): SARS Coronavirus 2: NEGATIVE

## 2019-08-27 ENCOUNTER — Ambulatory Visit
Admission: RE | Admit: 2019-08-27 | Discharge: 2019-08-27 | Disposition: A | Discharge status: Home / Self Care | Payer: BC Managed Care – PPO | Source: Ambulatory Visit | Attending: General Surgery | Admitting: General Surgery

## 2019-08-27 ENCOUNTER — Other Ambulatory Visit: Payer: Self-pay

## 2019-08-27 ENCOUNTER — Encounter: Payer: Self-pay | Admitting: General Surgery

## 2019-08-27 ENCOUNTER — Telehealth: Payer: Self-pay | Admitting: *Deleted

## 2019-08-27 ENCOUNTER — Observation Stay
Admission: RE | Admit: 2019-08-27 | Discharge: 2019-08-28 | Disposition: A | Discharge status: Home / Self Care | Payer: BC Managed Care – PPO | Attending: General Surgery | Admitting: General Surgery

## 2019-08-27 ENCOUNTER — Encounter
Admission: RE | Disposition: A | Discharge status: Home / Self Care | Payer: Self-pay | Source: Home / Self Care | Attending: General Surgery

## 2019-08-27 ENCOUNTER — Ambulatory Visit: Payer: BC Managed Care – PPO | Admitting: Anesthesiology

## 2019-08-27 DIAGNOSIS — C50211 Malignant neoplasm of upper-inner quadrant of right female breast: Secondary | ICD-10-CM

## 2019-08-27 DIAGNOSIS — F411 Generalized anxiety disorder: Secondary | ICD-10-CM | POA: Diagnosis not present

## 2019-08-27 DIAGNOSIS — C50919 Malignant neoplasm of unspecified site of unspecified female breast: Secondary | ICD-10-CM | POA: Diagnosis present

## 2019-08-27 DIAGNOSIS — Z79899 Other long term (current) drug therapy: Secondary | ICD-10-CM | POA: Insufficient documentation

## 2019-08-27 DIAGNOSIS — I10 Essential (primary) hypertension: Secondary | ICD-10-CM | POA: Insufficient documentation

## 2019-08-27 DIAGNOSIS — Z87442 Personal history of urinary calculi: Secondary | ICD-10-CM | POA: Diagnosis not present

## 2019-08-27 DIAGNOSIS — Z170 Estrogen receptor positive status [ER+]: Secondary | ICD-10-CM | POA: Insufficient documentation

## 2019-08-27 DIAGNOSIS — R7303 Prediabetes: Secondary | ICD-10-CM | POA: Diagnosis not present

## 2019-08-27 DIAGNOSIS — R42 Dizziness and giddiness: Secondary | ICD-10-CM | POA: Insufficient documentation

## 2019-08-27 HISTORY — PX: BREAST RECONSTRUCTION WITH PLACEMENT OF TISSUE EXPANDER AND FLEX HD (ACELLULAR HYDRATED DERMIS): SHX6295

## 2019-08-27 HISTORY — PX: TOTAL MASTECTOMY: SHX6129

## 2019-08-27 LAB — POCT PREGNANCY, URINE: Preg Test, Ur: NEGATIVE

## 2019-08-27 SURGERY — MASTECTOMY, SIMPLE
Anesthesia: General | Site: Breast | Laterality: Bilateral

## 2019-08-27 MED ORDER — METOPROLOL SUCCINATE ER 100 MG PO TB24
100.0000 mg | ORAL_TABLET | Freq: Every day | ORAL | Status: DC
  Administered 2019-08-27: 100 mg via ORAL
  Filled 2019-08-27: qty 1

## 2019-08-27 MED ORDER — CEFAZOLIN SODIUM-DEXTROSE 2-4 GM/100ML-% IV SOLN
2.0000 g | Freq: Three times a day (TID) | INTRAVENOUS | Status: DC
  Administered 2019-08-27 – 2019-08-28 (×3): 2 g via INTRAVENOUS
  Filled 2019-08-27 (×6): qty 100

## 2019-08-27 MED ORDER — IBUPROFEN 400 MG PO TABS
400.0000 mg | ORAL_TABLET | Freq: Four times a day (QID) | ORAL | Status: DC
  Administered 2019-08-27 – 2019-08-28 (×4): 400 mg via ORAL
  Filled 2019-08-27 (×5): qty 1

## 2019-08-27 MED ORDER — ROCURONIUM BROMIDE 10 MG/ML (PF) SYRINGE
PREFILLED_SYRINGE | INTRAVENOUS | Status: AC
  Filled 2019-08-27: qty 10

## 2019-08-27 MED ORDER — PROPOFOL 10 MG/ML IV BOLUS
INTRAVENOUS | Status: DC | PRN
  Administered 2019-08-27: 140 mg via INTRAVENOUS

## 2019-08-27 MED ORDER — DIPHENHYDRAMINE HCL 12.5 MG/5ML PO ELIX
12.5000 mg | ORAL_SOLUTION | Freq: Four times a day (QID) | ORAL | Status: DC | PRN
  Filled 2019-08-27: qty 5

## 2019-08-27 MED ORDER — CEFAZOLIN SODIUM-DEXTROSE 2-4 GM/100ML-% IV SOLN
INTRAVENOUS | Status: AC
  Filled 2019-08-27: qty 100

## 2019-08-27 MED ORDER — ONDANSETRON HCL 4 MG/2ML IJ SOLN: 4.0000 mg | Freq: Four times a day (QID) | INTRAMUSCULAR | Status: DC | PRN

## 2019-08-27 MED ORDER — TECHNETIUM TC 99M SULFUR COLLOID FILTERED
1.0000 | Freq: Once | INTRAVENOUS | Status: AC | PRN
  Administered 2019-08-27: 0.666 via INTRADERMAL

## 2019-08-27 MED ORDER — ONDANSETRON HCL 4 MG/2ML IJ SOLN
INTRAMUSCULAR | Status: AC
Start: 2019-08-27 — End: ?
  Filled 2019-08-27: qty 2

## 2019-08-27 MED ORDER — FENTANYL CITRATE (PF) 100 MCG/2ML IJ SOLN
25.0000 ug | INTRAMUSCULAR | Status: AC | PRN
  Administered 2019-08-27 (×8): 25 ug via INTRAVENOUS

## 2019-08-27 MED ORDER — CHLORHEXIDINE GLUCONATE 0.12 % MT SOLN: 15.0000 mL | Freq: Once | OROMUCOSAL | Status: AC

## 2019-08-27 MED ORDER — ORAL CARE MOUTH RINSE: 15.0000 mL | Freq: Once | OROMUCOSAL | Status: AC

## 2019-08-27 MED ORDER — SODIUM CHLORIDE 0.9 % IV SOLN
INTRAVENOUS | Status: DC | PRN
  Administered 2019-08-27: 35 mL

## 2019-08-27 MED ORDER — DEXAMETHASONE SODIUM PHOSPHATE 10 MG/ML IJ SOLN
INTRAMUSCULAR | Status: AC
  Filled 2019-08-27: qty 1

## 2019-08-27 MED ORDER — SODIUM CHLORIDE (PF) 0.9 % IJ SOLN
INTRAMUSCULAR | Status: AC
  Filled 2019-08-27: qty 50

## 2019-08-27 MED ORDER — FENTANYL CITRATE (PF) 100 MCG/2ML IJ SOLN
INTRAMUSCULAR | Status: AC
  Filled 2019-08-27: qty 2

## 2019-08-27 MED ORDER — CHLORHEXIDINE GLUCONATE CLOTH 2 % EX PADS: 6.0000 | MEDICATED_PAD | Freq: Once | CUTANEOUS | Status: DC

## 2019-08-27 MED ORDER — DEXAMETHASONE SODIUM PHOSPHATE 10 MG/ML IJ SOLN
INTRAMUSCULAR | Status: DC | PRN
  Administered 2019-08-27: 10 mg via INTRAVENOUS

## 2019-08-27 MED ORDER — ONDANSETRON HCL 4 MG/2ML IJ SOLN
INTRAMUSCULAR | Status: DC | PRN
  Administered 2019-08-27 (×2): 4 mg via INTRAVENOUS

## 2019-08-27 MED ORDER — IMIPRAMINE HCL 50 MG PO TABS
50.0000 mg | ORAL_TABLET | Freq: Every day | ORAL | Status: DC
  Administered 2019-08-27: 50 mg via ORAL
  Filled 2019-08-27 (×2): qty 1

## 2019-08-27 MED ORDER — FENTANYL CITRATE (PF) 100 MCG/2ML IJ SOLN
INTRAMUSCULAR | Status: DC | PRN
  Administered 2019-08-27 (×4): 25 ug via INTRAVENOUS
  Administered 2019-08-27: 50 ug via INTRAVENOUS
  Administered 2019-08-27 (×2): 25 ug via INTRAVENOUS

## 2019-08-27 MED ORDER — CEFAZOLIN SODIUM-DEXTROSE 2-4 GM/100ML-% IV SOLN
2.0000 g | INTRAVENOUS | Status: AC
  Administered 2019-08-27: 2 g via INTRAVENOUS

## 2019-08-27 MED ORDER — ROCURONIUM BROMIDE 100 MG/10ML IV SOLN
INTRAVENOUS | Status: DC | PRN
  Administered 2019-08-27: 40 mg via INTRAVENOUS
  Administered 2019-08-27 (×5): 10 mg via INTRAVENOUS

## 2019-08-27 MED ORDER — BUPIVACAINE LIPOSOME 1.3 % IJ SUSP
INTRAMUSCULAR | Status: AC
  Filled 2019-08-27: qty 20

## 2019-08-27 MED ORDER — DIAZEPAM 2 MG PO TABS
2.0000 mg | ORAL_TABLET | Freq: Two times a day (BID) | ORAL | Status: DC | PRN
  Filled 2019-08-27: qty 1

## 2019-08-27 MED ORDER — LACTATED RINGERS IV SOLN: INTRAVENOUS | Status: DC

## 2019-08-27 MED ORDER — ONDANSETRON 4 MG PO TBDP: 4.0000 mg | ORAL_TABLET | Freq: Four times a day (QID) | ORAL | Status: DC | PRN

## 2019-08-27 MED ORDER — POLYETHYLENE GLYCOL 3350 17 G PO PACK
17.0000 g | PACK | Freq: Every day | ORAL | Status: DC | PRN
  Filled 2019-08-27 (×2): qty 1

## 2019-08-27 MED ORDER — MIDAZOLAM HCL 2 MG/2ML IJ SOLN
INTRAMUSCULAR | Status: AC
  Filled 2019-08-27: qty 2

## 2019-08-27 MED ORDER — BUPIVACAINE-EPINEPHRINE (PF) 0.5% -1:200000 IJ SOLN
INTRAMUSCULAR | Status: AC
  Filled 2019-08-27: qty 30

## 2019-08-27 MED ORDER — KETOROLAC TROMETHAMINE 30 MG/ML IJ SOLN
30.0000 mg | Freq: Three times a day (TID) | INTRAMUSCULAR | Status: DC
  Administered 2019-08-27 – 2019-08-28 (×3): 30 mg via INTRAVENOUS
  Filled 2019-08-27 (×3): qty 1

## 2019-08-27 MED ORDER — PROPOFOL 10 MG/ML IV BOLUS
INTRAVENOUS | Status: AC
  Filled 2019-08-27: qty 20

## 2019-08-27 MED ORDER — HYDROMORPHONE HCL 1 MG/ML IJ SOLN
0.5000 mg | INTRAMUSCULAR | Status: DC | PRN
  Administered 2019-08-27: 0.5 mg via INTRAVENOUS

## 2019-08-27 MED ORDER — SUGAMMADEX SODIUM 200 MG/2ML IV SOLN
INTRAVENOUS | Status: DC | PRN
  Administered 2019-08-27: 150 mg via INTRAVENOUS

## 2019-08-27 MED ORDER — FENTANYL CITRATE (PF) 100 MCG/2ML IJ SOLN: 25.0000 ug | INTRAMUSCULAR | Status: DC | PRN

## 2019-08-27 MED ORDER — CHLORHEXIDINE GLUCONATE 0.12 % MT SOLN
OROMUCOSAL | Status: AC
  Administered 2019-08-27: 15 mL via OROMUCOSAL
  Filled 2019-08-27: qty 15

## 2019-08-27 MED ORDER — HYDROCODONE-ACETAMINOPHEN 5-325 MG PO TABS
1.0000 | ORAL_TABLET | Freq: Four times a day (QID) | ORAL | Status: DC | PRN
  Administered 2019-08-27: 1 via ORAL
  Filled 2019-08-27: qty 1

## 2019-08-27 MED ORDER — BACITRACIN 50000 UNITS IM SOLR
INTRAMUSCULAR | Status: AC
  Filled 2019-08-27: qty 1

## 2019-08-27 MED ORDER — ONDANSETRON HCL 4 MG/2ML IJ SOLN
INTRAMUSCULAR | Status: AC
  Filled 2019-08-27: qty 2

## 2019-08-27 MED ORDER — KCL IN DEXTROSE-NACL 20-5-0.45 MEQ/L-%-% IV SOLN
INTRAVENOUS | Status: DC
  Filled 2019-08-27 (×6): qty 1000

## 2019-08-27 MED ORDER — BUPIVACAINE-EPINEPHRINE 0.5% -1:200000 IJ SOLN
INTRAMUSCULAR | Status: DC | PRN
  Administered 2019-08-27: 15 mL

## 2019-08-27 MED ORDER — SENNA 8.6 MG PO TABS
1.0000 | ORAL_TABLET | Freq: Two times a day (BID) | ORAL | Status: DC
  Administered 2019-08-27 (×2): 8.6 mg via ORAL
  Filled 2019-08-27 (×5): qty 1

## 2019-08-27 MED ORDER — HYDROMORPHONE HCL 1 MG/ML IJ SOLN
INTRAMUSCULAR | Status: AC
  Filled 2019-08-27: qty 1

## 2019-08-27 MED ORDER — MORPHINE SULFATE (PF) 4 MG/ML IV SOLN
4.0000 mg | INTRAVENOUS | Status: DC | PRN
  Filled 2019-08-27 (×2): qty 1

## 2019-08-27 MED ORDER — LIDOCAINE HCL (CARDIAC) PF 100 MG/5ML IV SOSY
PREFILLED_SYRINGE | INTRAVENOUS | Status: DC | PRN
  Administered 2019-08-27: 60 mg via INTRAVENOUS

## 2019-08-27 MED ORDER — ONDANSETRON HCL 4 MG/2ML IJ SOLN
4.0000 mg | Freq: Once | INTRAMUSCULAR | Status: AC | PRN
  Administered 2019-08-27: 4 mg via INTRAVENOUS

## 2019-08-27 MED ORDER — DIPHENHYDRAMINE HCL 50 MG/ML IJ SOLN: 12.5000 mg | Freq: Four times a day (QID) | INTRAMUSCULAR | Status: DC | PRN

## 2019-08-27 MED ORDER — MIDAZOLAM HCL 2 MG/2ML IJ SOLN
INTRAMUSCULAR | Status: DC | PRN
  Administered 2019-08-27: 2 mg via INTRAVENOUS

## 2019-08-27 MED ORDER — CEFAZOLIN SODIUM-DEXTROSE 2-4 GM/100ML-% IV SOLN: 2.0000 g | INTRAVENOUS | Status: DC

## 2019-08-27 SURGICAL SUPPLY — 95 items
ADH SKN CLS APL DERMABOND .7 (GAUZE/BANDAGES/DRESSINGS) ×4
APL PRP STRL LF DISP 70% ISPRP (MISCELLANEOUS) ×2
APPLIER CLIP 11 MED OPEN (CLIP)
APPLIER CLIP 9.375 SM OPEN (CLIP)
APR CLP MED 11 20 MLT OPN (CLIP)
APR CLP SM 9.3 20 MLT OPN (CLIP)
BAG DECANTER FOR FLEXI CONT (MISCELLANEOUS) ×3 IMPLANT
BINDER BREAST LRG (GAUZE/BANDAGES/DRESSINGS) ×1 IMPLANT
BINDER BREAST MEDIUM (GAUZE/BANDAGES/DRESSINGS) IMPLANT
BINDER BREAST XLRG (GAUZE/BANDAGES/DRESSINGS) IMPLANT
BINDER BREAST XXLRG (GAUZE/BANDAGES/DRESSINGS) IMPLANT
BIOPATCH WHT 1IN DISK W/4.0 H (GAUZE/BANDAGES/DRESSINGS) ×8 IMPLANT
BLADE BOVIE TIP EXT 4 (BLADE) ×3 IMPLANT
BLADE SURG 15 STRL LF DISP TIS (BLADE) ×4 IMPLANT
BLADE SURG 15 STRL SS (BLADE) ×6
BNDG GAUZE 4.5X4.1 6PLY STRL (MISCELLANEOUS) ×4 IMPLANT
BULB RESERV EVAC DRAIN JP 100C (MISCELLANEOUS) ×6 IMPLANT
CANISTER SUCT 1200ML W/VALVE (MISCELLANEOUS) ×6 IMPLANT
CHLORAPREP W/TINT 26 (MISCELLANEOUS) ×3 IMPLANT
CLIP APPLIE 11 MED OPEN (CLIP) IMPLANT
CLIP APPLIE 9.375 SM OPEN (CLIP) IMPLANT
CNTNR SPEC 2.5X3XGRAD LEK (MISCELLANEOUS) ×2
CONT SPEC 4OZ STER OR WHT (MISCELLANEOUS) ×1
CONT SPEC 4OZ STRL OR WHT (MISCELLANEOUS) ×2
CONTAINER SPEC 2.5X3XGRAD LEK (MISCELLANEOUS) ×2 IMPLANT
COVER WAND RF STERILE (DRAPES) ×3 IMPLANT
DECANTER SPIKE VIAL GLASS SM (MISCELLANEOUS) IMPLANT
DERMABOND ADVANCED (GAUZE/BANDAGES/DRESSINGS) ×2
DERMABOND ADVANCED .7 DNX12 (GAUZE/BANDAGES/DRESSINGS) ×4 IMPLANT
DEVICE DUBIN SPECIMEN MAMMOGRA (MISCELLANEOUS) ×3 IMPLANT
DRAIN CHANNEL JP 19F (MISCELLANEOUS) ×8 IMPLANT
DRAPE LAPAROTOMY 100X77 ABD (DRAPES) ×2 IMPLANT
DRAPE LAPAROTOMY 77X122 PED (DRAPES) ×2 IMPLANT
DRAPE LAPAROTOMY TRNSV 106X77 (MISCELLANEOUS) IMPLANT
DRSG TELFA 3X8 NADH (GAUZE/BANDAGES/DRESSINGS) ×3 IMPLANT
ELECT CAUTERY BLADE 6.4 (BLADE) ×3 IMPLANT
ELECT CAUTERY BLADE TIP 2.5 (TIP) ×3
ELECT REM PT RETURN 9FT ADLT (ELECTROSURGICAL) ×3
ELECTRODE CAUTERY BLDE TIP 2.5 (TIP) ×2 IMPLANT
ELECTRODE REM PT RTRN 9FT ADLT (ELECTROSURGICAL) ×2 IMPLANT
GAUZE SPONGE 4X4 12PLY STRL (GAUZE/BANDAGES/DRESSINGS) ×3 IMPLANT
GAUZE XEROFORM 1X8 LF (GAUZE/BANDAGES/DRESSINGS) IMPLANT
GLOVE BIO SURGEON STRL SZ 6.5 (GLOVE) ×22 IMPLANT
GLOVE BIOGEL PI IND STRL 6.5 (GLOVE) ×2 IMPLANT
GLOVE BIOGEL PI INDICATOR 6.5 (GLOVE) ×4
GOWN STRL REUS W/ TWL LRG LVL3 (GOWN DISPOSABLE) ×12 IMPLANT
GOWN STRL REUS W/TWL LRG LVL3 (GOWN DISPOSABLE) ×18
GRAFT FLEX HD 6X16 PLIABLE (Tissue) ×2 IMPLANT
IMPL EXPANDER BREAST 535CC (Breast) IMPLANT
IMPLANT BREAST 535CC (Breast) ×2 IMPLANT
IMPLANT EXPANDER BREAST 535CC (Breast) ×4 IMPLANT
IV NS 1000ML (IV SOLUTION)
IV NS 1000ML BAXH (IV SOLUTION) IMPLANT
IV NS 500ML (IV SOLUTION)
IV NS 500ML BAXH (IV SOLUTION) IMPLANT
JACKSON PRATT 10 (INSTRUMENTS) ×2 IMPLANT
KIT TURNOVER KIT A (KITS) ×3 IMPLANT
LABEL OR SOLS (LABEL) ×3 IMPLANT
MARGIN MAP 10MM (MISCELLANEOUS) ×2 IMPLANT
NDL 21 GA WING INFUSION (NEEDLE) IMPLANT
NDL FILTER BLUNT 18X1 1/2 (NEEDLE) ×4 IMPLANT
NEEDLE 21 GA WING INFUSION (NEEDLE) IMPLANT
NEEDLE FILTER BLUNT 18X 1/2SAF (NEEDLE) ×2
NEEDLE FILTER BLUNT 18X1 1/2 (NEEDLE) ×4 IMPLANT
NEEDLE HYPO 22GX1.5 SAFETY (NEEDLE) ×3 IMPLANT
PACK BASIN MAJOR (MISCELLANEOUS) ×6 IMPLANT
PACK UNIVERSAL (MISCELLANEOUS) ×1 IMPLANT
PAD ABD DERMACEA PRESS 5X9 (GAUZE/BANDAGES/DRESSINGS) ×8 IMPLANT
PAD DRESSING TELFA 3X8 NADH (GAUZE/BANDAGES/DRESSINGS) ×2 IMPLANT
PIN SAFETY STRL (MISCELLANEOUS) ×2 IMPLANT
SET ASEPTIC TRANSFER (MISCELLANEOUS) ×7 IMPLANT
SLEVE PROBE SENORX GAMMA FIND (MISCELLANEOUS) ×3 IMPLANT
SOL PREP PVP 2OZ (MISCELLANEOUS) ×6
SOLUTION PREP PVP 2OZ (MISCELLANEOUS) ×4 IMPLANT
SPONGE LAP 18X18 RF (DISPOSABLE) ×9 IMPLANT
SUT ETHILON 3-0 FS-10 30 BLK (SUTURE) ×3
SUT MNCRL 3-0 UNDYED SH (SUTURE) ×4 IMPLANT
SUT MNCRL 4-0 (SUTURE) ×6
SUT MNCRL 4-0 27XMFL (SUTURE) ×4
SUT MNCRL+ 5-0 UNDYED PC-3 (SUTURE) ×4 IMPLANT
SUT MONOCRYL 3-0 UNDYED (SUTURE) ×6
SUT MONOCRYL 5-0 (SUTURE)
SUT PDS PLUS 2 (SUTURE) ×30
SUT PDS PLUS AB 2-0 CT-1 (SUTURE) ×12 IMPLANT
SUT SILK 2 0 SH (SUTURE) ×6 IMPLANT
SUT SILK 4 0 SH (SUTURE) ×4 IMPLANT
SUT VIC AB 3-0 SH 27 (SUTURE)
SUT VIC AB 3-0 SH 27X BRD (SUTURE) IMPLANT
SUT VIC AB 3-0 SH 8-18 (SUTURE) ×3 IMPLANT
SUTURE EHLN 3-0 FS-10 30 BLK (SUTURE) ×2 IMPLANT
SUTURE MNCRL 4-0 27XMF (SUTURE) ×8 IMPLANT
SYR 10ML LL (SYRINGE) ×6 IMPLANT
SYR BULB IRRIG 60ML STRL (SYRINGE) ×3 IMPLANT
TOWEL OR 17X26 4PK STRL BLUE (TOWEL DISPOSABLE) ×3 IMPLANT
WATER STERILE IRR 1000ML POUR (IV SOLUTION) ×3 IMPLANT

## 2019-08-27 NOTE — Op Note (Signed)
Op report    DATE OF OPERATION:  08/27/2019  LOCATION: [facility]  SURGICAL DIVISION: Plastic Surgery  PREOPERATIVE DIAGNOSES:  1. Right breast cancer.    POSTOPERATIVE DIAGNOSES:  1. Right breast cancer.   PROCEDURE:  1.  Bilateral immediate breast reconstruction with placement of Acellular Dermal Matrix and tissue expanders.  SURGEON: Julious Langlois Sanger Concepcion Kirkpatrick, DO  ASSISTANT: Roetta Sessions, PA  ANESTHESIA:  General.   COMPLICATIONS: None.   IMPLANTS: Left - Mentor 535 cc. Ref #[id number], 150 cc of injectable saline placed in the expander. Right - Mentor 535 cc. Ref #[id number], 150 cc of injectable saline placed in the expander. Acellular Dermal Matrix 6 x 16 cm two  INDICATIONS FOR PROCEDURE:  The patient, Tiffany Acevedo, is a 54 y.o. female born on 06/18/1965, is here for  immediate first stage breast reconstruction with placement of bilateral tissue expanders and Acellular dermal matrix. MRN: [id number]  CONSENT:  Informed consent was obtained directly from the patient. Risks, benefits and alternatives were fully discussed. Specific risks including but not limited to bleeding, infection, hematoma, seroma, scarring, pain, implant infection, implant extrusion, capsular contracture, asymmetry, wound healing problems, and need for further surgery were all discussed. The patient did have an ample opportunity to have her questions answered to her satisfaction.   DESCRIPTION OF PROCEDURE:  The patient was taken to the operating room by the general surgery team. SCDs were placed and IV antibiotics were given. The patient's chest was prepped and draped in a sterile fashion. A time out was performed and the implants to be used were identified.  Bilateral mastectomies were performed.  Once the general surgery team had completed their portion of the case the patient was rendered to the plastic and reconstructive surgery team.  Right:  The pectoralis major muscle was  lifted from the chest wall with release of the lateral edge and lateral inframammary fold.  The pocket was irrigated with antibiotic solution and hemostasis was achieved with electrocautery.  The ADM was then prepared according to the manufacture guidelines and slits placed to help with postoperative fluid management.  The ADM was then sutured to the inferior and lateral edge of the inframammary fold with 2-0 PDS starting with an interrupted stitch and then a running stitch.  The lateral portion was sutured to with interrupted sutures after the expander was placed.  The expander was prepared according to the manufacture guidelines, the air evacuated and then it was placed under the ADM and pectoralis major muscle.  The inferior and lateral tabs were used to secure the expander to the chest wall with 2-0 PDS.  The drain was placed at the inframammary fold over the ADM and secured to the skin with 3-0 Silk.  The deep layers were closed with 3-0 Monocryl followed by 4-0 Monocryl.  The skin was closed with 5-0 Monocryl and then dermabond was applied.   Left:  The pectoralis major muscle was lifted from the chest wall with release of the lateral edge and lateral inframammary fold.  The pocket was irrigated with antibiotic solution and hemostasis was achieved with electrocautery.  The ADM was then prepared according to the manufacture guidelines and slits placed to help with postoperative fluid management.  The ADM was then sutured to the inferior and lateral edge of the inframammary fold with 2-0 PDS starting with an interrupted stitch and then a running stitch.  The lateral portion was sutured to with interrupted sutures after the expander was placed.  The expander was prepared  according to the manufacture guidelines, the air evacuated and then it was placed under the ADM and pectoralis major muscle.  The inferior and lateral tabs were used to secure the expander to the chest wall with 2-0 PDS.  The drain was placed  at the inframammary fold over the ADM and secured to the skin with 3-0 Silk.    The deep layers were closed with 3-0 Monocryl followed by 4-0 Monocryl.  The skin was closed with 5-0 Monocryl and then dermabond was applied.  The ABDs and breast binder were placed.  The patient tolerated the procedure well and there were no complications.  The patient was allowed to wake from anesthesia and taken to the recovery room in satisfactory condition.   The advanced practice practitioner (APP) assisted throughout the case.  The APP was essential in retraction and counter traction when needed to make the case progress smoothly.  This retraction and assistance made it possible to see the tissue plans for the procedure.  The assistance was needed for blood control, tissue re-approximation and assisted with closure of the incision site.

## 2019-08-27 NOTE — Discharge Instructions (Addendum)
INSTRUCTIONS FOR AFTER SURGERY   You will likely have some questions about what to expect following your operation.  The following information will help you and your family understand what to expect when you are discharged from the hospital.  Following these guidelines will help ensure a smooth recovery and reduce risks of complications.  Postoperative instructions include information on: diet, wound care, medications and physical activity.  AFTER SURGERY Expect to go home after the procedure.  In some cases, you may need to spend one night in the hospital for observation.  DIET This surgery does not require a specific diet.  However, I have to mention that the healthier you eat the better your body can start healing. It is important to increasing your protein intake.  This means limiting the foods with added sugar.  Focus on fruits and vegetables and some meat.  If you have any liposuction during your procedure be sure to drink water.  If your urine is bright yellow, then it is concentrated, and you need to drink more water.  As a general rule after surgery, you should have 8 ounces of water every hour while awake.  If you find you are persistently nauseated or unable to take in liquids let us know.  NO TOBACCO USE or EXPOSURE.  This will slow your healing process and increase the risk of a wound.  WOUND CARE  If you have a drain: Clean with baby wipes until the drain is removed.   If you have steri-strips / tape directly attached to your skin leave them in place. It is OK to get these wet.  No baths, pools or hot tubs for two weeks. We close your incision to leave the smallest and best-looking scar. No ointment or creams on your incisions until given the go ahead.  Especially not Neosporin (Too many skin reactions with this one).  A few weeks after surgery you can use Mederma and start massaging the scar. We ask you to wear your binder or sports bra for the first 6 weeks around the clock, including  while sleeping. This provides added comfort and helps reduce the fluid accumulation at the surgery site.  ACTIVITY No heavy lifting until cleared by the doctor.  It is OK to walk and climb stairs. In fact, moving your legs is very important to decrease your risk of a blood clot.  It will also help keep you from getting deconditioned.  Every 1 to 2 hours get up and walk for 5 minutes. This will help with a quicker recovery back to normal.  Let pain be your guide so you don't do too much.  NO, you cannot do the spring cleaning and don't plan on taking care of anyone else.  This is your time for TLC.   WORK Everyone returns to work at different times. As a rough guide, most people take at least 1 - 2 weeks off prior to returning to work. If you need documentation for your job, bring the forms to your postoperative follow up visit.  DRIVING Arrange for someone to bring you home from the hospital.  You may be able to drive a few days after surgery but not while taking any narcotics or valium.  BOWEL MOVEMENTS Constipation can occur after anesthesia and while taking pain medication.  It is important to stay ahead for your comfort.  We recommend taking Milk of Magnesia (2 tablespoons; twice a day) while taking the pain pills.  SEROMA This is fluid your body   tried to put in the surgical site.  This is normal but if it creates excessive pain and swelling let us know.  It usually decreases in a few weeks.  MEDICATIONS and PAIN CONTROL At your preoperative visit for you history and physical you were given the following medications: 1. An antibiotic: Start this medication when you get home and take according to the instructions on the bottle. 2. Zofran 4 mg:  This is to treat nausea and vomiting.  You can take this every 6 hours as needed and only if needed. 3. Norco (hydrocodone/acetaminophen) 5/325 mg:  This is only to be used after you have taken the motrin or the tylenol. Every 8 hours as needed. Over  the counter Medication to take: 4. Ibuprofen (Motrin) 600 mg:  Take this every 6 hours.  If you have additional pain then take 500 mg of the tylenol.  Only take the Norco after you have tried these two. 5. Miralax or stool softener of choice: Take this according to the bottle if you take the Norco.  WHEN TO CALL Call your surgeon's office if any of the following occur: . Fever 101 degrees F or greater . Excessive bleeding or fluid from the incision site. . Pain that increases over time without aid from the medications . Redness, warmth, or pus draining from incision sites . Persistent nausea or inability to take in liquids . Severe misshapen area that underwent the operation.  [facility]  What is the benefit of having a drain?  During surgery your tissue layers are separated.  This raw surface stimulates your body to fill the space with serous fluid.  This is normal but you don't want that fluid to collect and prevent healing.  A fluid collection can also become infected.  The Jackson-Pratt (JP) drain is used to eliminate this collection of fluid and allow the tissue to heal together.    Jackson-Pratt (JP) bulb    How to care for your drainage and suction unit at home Your drainage catheter will be connected to a collection device. The vacuum caused when the device is compressed allows drainage to collect in the device.    . Wash your hands with soap and water before and after touching the system. . Empty the JP drain every 12 hours once you get home from your procedure. . Record the fluid amount on the record sheet included. . Start with stripping the drain tube to push the clots or excess fluid to the bulb.  Do this by pinching the tube with one hand near your skin.  Then with the other hand squeeze the tubing and work it toward the bulb.  This should be done several times a day.  This may collapse the tube which will correct on its own.   . Use a safety pin to  attach your collection device to your clothing so there is no tension on the insertion site.   . If you have drainage at the skin insertion site, you can apply a gauze dressing and secure it with tape. . If the drain falls out, apply a gauze dressing over the drain insertion site and secure with tape.   To empty the collection device:   . Release the stopper on the top of the collection unit (bulb).  . Pour contents into a measuring container such as a plastic medicine cup.  . Record the day and amount of drainage on the attached sheet. . This should be done   at least twice a day.    To compress the Jackson-Pratt Bulb:  . Release the stopper at the top of the bulb. . Squeeze the bulb tightly in your fist, squeezing air out of the bulb.  . Replace the stopper while the bulb is compressed.  . Be careful not to spill the contents when squeezing the bulb. . The drainage will start bright red and turn to pink and then yellow with time. . IMPORTANT: If the bulb is not squeezed before adding the stopper it will not draw out the fluid.  Care for the JP drain site and your skin daily:  . You may shower three days after surgery. . Secure the drain to a ribbon or cloth around your waist while showering so it does not pull out while showering. . Be sure your hands are cleaned with soap and water. . Use a clean wet cotton swab to clean the skin around the drain site.  . Use another cotton swab to place Vaseline or antibiotic ointment on the skin around the drain.     Contact your physician if any of the following occur:  . The fluid in the bulb becomes cloudy. . Your temperature is greater than 101.4.  . The incision opens. . If you have drainage at the skin insertion site, you can apply a gauze dressing and secure it with tape. . If the drain falls out, apply a gauze dressing over the drain insertion site and secure with tape.  . You will usually have more drainage when you are active than while you  rest or are asleep. If the drainage increases significantly or is bloody call the physician                             Bring this record with you to each office visit Date  Drainage Volume  Date   Drainage volume                                                                                                                                                                                          

## 2019-08-27 NOTE — Op Note (Addendum)
Preoperative diagnosis: Invasive Mammary Carcinoma of the right breast.  Postoperative diagnosis: Invasive Mammary Carcinoma of the right breast.   Procedure: Bilateral total mastectomy.                     Sentinel Lymph node biopsy of right axilla  Anesthesia: GETA  Surgeon: Dr. Cintron Diaz  Wound Classification: Clean  Indications: Patient is a 53 y.o. female who had an abnormal mammogram that on workup with core needle biopsy was found to be invasive mammary carcinoma. 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 After discussion of alternatives, the patient elected bilateral total mastectomy. Sentinel lymph node biopsy of the right side indicated for staging.   Findings: 1. Viable flaps.  2. Adequate hemostasis 3. No gross abnormality or masses.   Description of procedure: The patient was brought to the operating room and general anesthesia was induced. A time-out was completed verifying correct patient, procedure, site, positioning, and implant(s) and/or special equipment prior to beginning this procedure. Bilateral breasts, chest wall, axilla, and upper arm and neck were prepped and draped in the usual sterile fashion.  The skin incision on the right breast was made that encompassed the nipple-areola complex and the previous biopsy scar and passed in an oblique direction across the breast. Flaps were raised in the avascular plane between subcutaneous tissue and breast tissue from the clavicle superiorly, the sternum medially, the anterior rectus sheath inferiorly, and past the lateral border of the pectoralis major muscle laterally. Hemostasis was achieved in the flaps. Next, the breast tissue and underlying pectoralis fascia were excised from the pectoralis major muscle, progressing from medially to laterally. At the lateral border of the pectoralis major muscle, the breast tissue was swung laterally and a lateral pedicle identified where breast tissue gave way to fat of  axilla. The lateral pedicle was incised and the specimen removed.  The wound was irrigated and hemostasis was achieved.  A hand-held gamma probe was used to identify the location of the hottest spot in the axilla. Dissection was carried down until subdermal facias was advanced. The probe was placed and again, the point of maximal count was found. Dissection continue until nodule was identified. The probe was placed in contact with the node. The node was excised in its entirety.  An additional hot spot was detected and the node was excised in similar fashion. No clinically abnormal nodes were palpated. Hemostasis was achieved and the axillary fascia closed with 3-0 Vicryl. The wound was left open with a moist lap pad for immediate reconstruction. The left mastectomy was done the same fashion. No sentinel lymph node biopsy was done on the left axillary area. Wound also left open for immediate wound reconstruction.     Specimen: Right Breast                    Sentinel lymph nodes #1, #2, #3                     Left Breast  Complications: None  Estimated Blood Loss: 50 mL   

## 2019-08-27 NOTE — Anesthesia Procedure Notes (Signed)
Procedure Name: Intubation Performed by: Aline Brochure, CRNA Pre-anesthesia Checklist: Patient identified, Patient being monitored, Timeout performed, Emergency Drugs available and Suction available Patient Re-evaluated:Patient Re-evaluated prior to induction Oxygen Delivery Method: Circle system utilized Preoxygenation: Pre-oxygenation with 100% oxygen Induction Type: IV induction Ventilation: Mask ventilation without difficulty Laryngoscope Size: Mac and 3 Grade View: Grade II Tube type: Oral Tube size: 7.0 mm Number of attempts: 1 Placement Confirmation: ETT inserted through vocal cords under direct vision,  positive ETCO2 and breath sounds checked- equal and bilateral Secured at: 21 cm Tube secured with: Tape Dental Injury: Teeth and Oropharynx as per pre-operative assessment

## 2019-08-27 NOTE — Interval H&P Note (Signed)
History and Physical Interval Note:  08/27/2019 11:06 AM  Tiffany Acevedo  has presented today for surgery, with the diagnosis of C50.211 Malignant neoplasm of upper inner quadrant of Rt female breast, unspecified estrogen receptor status.  The various methods of treatment have been discussed with the patient and family. After consideration of risks, benefits and other options for treatment, the patient has consented to  Procedure(s) with comments: TOTAL MASTECTOMY (Bilateral) - START @930  DUE TO SENTINEL NODE BILATERAL BREAST RECONSTRUCTION WITH PLACEMENT OF TISSUE EXPANDER AND FLEX HD (ACELLULAR HYDRATED DERMIS) (Bilateral) as a surgical intervention.  The patient's history has been reviewed, patient examined, no change in status, stable for surgery.  I have reviewed the patient's chart and labs.  Questions were answered to the patient's satisfaction.     Loel Lofty Amiree No

## 2019-08-27 NOTE — Anesthesia Preprocedure Evaluation (Signed)
Anesthesia Evaluation  Patient identified by MRN, date of birth, ID band Patient awake    Reviewed: Allergy & Precautions, H&P , NPO status , Patient's Chart, lab work & pertinent test results, reviewed documented beta blocker date and time   Airway Mallampati: II  TM Distance: >3 FB Neck ROM: full    Dental  (+) Teeth Intact   Pulmonary neg pulmonary ROS,    Pulmonary exam normal        Cardiovascular Exercise Tolerance: Good hypertension, On Medications negative cardio ROS Normal cardiovascular exam Rhythm:regular Rate:Normal     Neuro/Psych  Headaches, PSYCHIATRIC DISORDERS Anxiety Depression    GI/Hepatic Neg liver ROS, GERD  Medicated,  Endo/Other  negative endocrine ROS  Renal/GU Renal disease  negative genitourinary   Musculoskeletal   Abdominal   Peds  Hematology negative hematology ROS (+)   Anesthesia Other Findings Past Medical History: No date: Allergy No date: Anxiety No date: Depression 06/30/2016: Excessive weight gain No date: Family history of colon cancer No date: Family history of colon cancer No date: Family history of prostate cancer No date: Frequent headaches No date: GERD (gastroesophageal reflux disease) No date: History of kidney stones No date: Hypertension No date: Migraines No date: Pre-diabetes Past Surgical History: 07/13/2019: BREAST BIOPSY; Right     Comment:  stereo biopsy/ x clip/path pending 07/13/2019: BREAST BIOPSY; Right     Comment:  stereo biopsy/coil clip/ path pending 1994, 2004, 2013: CESAREAN SECTION 2015: COLONOSCOPY 09/13/2018: CYSTOSCOPY W/ RETROGRADES; Right     Comment:  Procedure: [place];                Surgeon: Abbie Sons, MD;  Location: [facility];                Service: Urology;  Laterality: Right; 05/09/2018: CYSTOSCOPY/URETEROSCOPY/HOLMIUM LASER/STENT PLACEMENT; Left     Comment:  Procedure:  CYSTOSCOPY/URETEROSCOPY/HOLMIUM LASER/STENT               PLACEMENT;  Surgeon: Abbie Sons, MD;  Location:               [facility];  Service: Urology;  Laterality: Left; 09/13/2018: CYSTOSCOPY/URETEROSCOPY/HOLMIUM LASER/STENT PLACEMENT; Right     Comment:  Procedure: CYSTOSCOPY/URETEROSCOPY/HOLMIUM LASER/STENT               PLACEMENT;  Surgeon: Abbie Sons, MD;  Location:               [facility];  Service: Urology;  Laterality: Right; No date: INCONTINENCE SURGERY     Comment:  BLADDER TUCK No date: PILONIDAL CYST EXCISION 09/13/2018: STONE EXTRACTION WITH BASKET; Right     Comment:  Procedure: STONE EXTRACTION WITH BASKET;  Surgeon:               Abbie Sons, MD;  Location: [facility];  Service:               Urology;  Laterality: Right; No date: UPPER GI ENDOSCOPY   Reproductive/Obstetrics negative OB ROS                             Anesthesia Physical Anesthesia Plan  ASA: II  Anesthesia Plan: General ETT   Post-op Pain Management:    Induction:   PONV Risk Score and Plan:   Airway Management Planned:   Additional Equipment:   Intra-op Plan:   Post-operative Plan:   Informed Consent: I have reviewed  the patients History and Physical, chart, labs and discussed the procedure including the risks, benefits and alternatives for the proposed anesthesia with the patient or authorized representative who has indicated his/her understanding and acceptance.     Dental Advisory Given  Plan Discussed with: CRNA  Anesthesia Plan Comments:         Anesthesia Quick Evaluation

## 2019-08-27 NOTE — Transfer of Care (Signed)
Immediate Anesthesia Transfer of Care Note  Patient: Tiffany Acevedo  Procedure(s) Performed: TOTAL MASTECTOMY (Bilateral Breast) BILATERAL BREAST RECONSTRUCTION WITH PLACEMENT OF TISSUE EXPANDER AND FLEX HD (ACELLULAR HYDRATED DERMIS) (Bilateral )  Patient Location: PACU  Anesthesia Type:General  Level of Consciousness: sedated  Airway & Oxygen Therapy: Patient connected to face mask oxygen  Post-op Assessment: Post -op Vital signs reviewed and stable  Post vital signs: stable  Last Vitals:  Vitals Value Taken Time  BP 111/64 08/27/19 1428  Temp 37.4 C 08/27/19 1428  Pulse 83 08/27/19 1433  Resp 16 08/27/19 1433  SpO2 96 % 08/27/19 1433  Vitals shown include unvalidated device data.  Last Pain:  Vitals:   08/27/19 0912  TempSrc: Temporal  PainSc: 0-No pain         Complications: No complications documented.

## 2019-08-27 NOTE — Telephone Encounter (Signed)
Received Confirmation of Order on (08/20/19) via of fax from [facility].  Requesting Notes/Clinicals and signature.  Given to provider to sign.  Order signed and [facility].  Confirmation received copy scanned into the chart.//AB/CMA

## 2019-08-27 NOTE — Interval H&P Note (Signed)
History and Physical Interval Note:  08/27/2019 9:02 AM  Tiffany Acevedo  has presented today for surgery, with the diagnosis of C50.211 Malignant neoplasm of upper inner quadrant of Rt female breast, unspecified estrogen receptor status.  The various methods of treatment have been discussed with the patient and family. After consideration of risks, benefits and other options for treatment, the patient has consented to  Procedure(s) with comments: TOTAL MASTECTOMY (Bilateral) -  BILATERAL BREAST RECONSTRUCTION WITH PLACEMENT OF TISSUE EXPANDER AND FLEX HD (ACELLULAR HYDRATED DERMIS) (Bilateral) as a surgical intervention.  The patient's history has been reviewed, patient examined, no change in status, stable for surgery.  I have reviewed the patient's chart and labs. Right breast marked in the pre procedure room since it is the side of sentinel lymph node biopsy. Questions were answered to the patient's satisfaction.     Herbert Pun

## 2019-08-27 NOTE — Anesthesia Postprocedure Evaluation (Signed)
Anesthesia Post Note  Patient: Tiffany Acevedo  Procedure(s) Performed: TOTAL MASTECTOMY (Bilateral Breast) BILATERAL BREAST RECONSTRUCTION WITH PLACEMENT OF TISSUE EXPANDER AND FLEX HD (ACELLULAR HYDRATED DERMIS) (Bilateral )  Patient location during evaluation: PACU Anesthesia Type: General Level of consciousness: awake and alert and oriented Pain management: pain level controlled Vital Signs Assessment: post-procedure vital signs reviewed and stable Respiratory status: spontaneous breathing, nonlabored ventilation and respiratory function stable Cardiovascular status: blood pressure returned to baseline and stable Postop Assessment: no signs of nausea or vomiting Anesthetic complications: no   No complications documented.   Last Vitals:  Vitals:   08/27/19 1555 08/27/19 1600  BP:    Pulse: 85 85  Resp: 11 (!) 8  Temp:    SpO2: 98% 98%    Last Pain:  Vitals:   08/27/19 1600  TempSrc:   PainSc: 5                  Anysha Frappier

## 2019-08-28 ENCOUNTER — Encounter: Payer: Self-pay | Admitting: General Surgery

## 2019-08-28 LAB — HIV ANTIBODY (ROUTINE TESTING W REFLEX): HIV Screen 4th Generation wRfx: NONREACTIVE

## 2019-08-28 MED ORDER — VENLAFAXINE HCL ER 75 MG PO CP24: 150.0000 mg | ORAL_CAPSULE | Freq: Every day | ORAL | Status: DC

## 2019-08-28 MED ORDER — KETOROLAC TROMETHAMINE 10 MG PO TABS: 10.0000 mg | ORAL_TABLET | Freq: Three times a day (TID) | ORAL | 0 refills | Status: DC

## 2019-08-28 MED ORDER — PANTOPRAZOLE SODIUM 40 MG PO TBEC
40.0000 mg | DELAYED_RELEASE_TABLET | Freq: Every day | ORAL | Status: DC
  Administered 2019-08-28: 40 mg via ORAL
  Filled 2019-08-28: qty 1

## 2019-08-28 MED ORDER — HYDROCHLOROTHIAZIDE 12.5 MG PO CAPS
12.5000 mg | ORAL_CAPSULE | Freq: Every day | ORAL | Status: DC
  Administered 2019-08-28: 12.5 mg via ORAL
  Filled 2019-08-28: qty 1

## 2019-08-28 MED ORDER — IMIPRAMINE HCL 50 MG PO TABS
50.0000 mg | ORAL_TABLET | Freq: Every day | ORAL | Status: DC
  Filled 2019-08-28: qty 1

## 2019-08-28 NOTE — Discharge Summary (Signed)
Patient ID: Tiffany Acevedo MRN: [id number] DOB/AGE: 1966/01/18 54 y.o.  Admit date: 08/27/2019 Discharge date: 08/28/2019   Discharge Diagnoses:  Active Problems:   Breast cancer [facility])   Procedures: Bilateral total mastectomy and immediate reconstruction and right axillary sentinel lymph node biopsy  Hospital Course: Patient tolerated the procedure well.  She was admitted overnight for pain control and management of drains.  This morning patient with pain under control.  Wounds are healing well with viable flaps without fluid collection, seromas or hematomas.  Physical Exam Exam conducted with a chaperone present.  Cardiovascular:     Rate and Rhythm: Normal rate and regular rhythm.  Pulmonary:     Effort: Pulmonary effort is normal.     Breath sounds: Normal breath sounds.  Chest:     Chest wall: No mass.     Breasts:        Right: Absent.        Left: Absent.  Abdominal:     General: Abdomen is flat.  Neurological:     Mental Status: She is alert.   Bilateral mastectomies with viable skin flaps, capillary refill less than 2 seconds.  No bruises, fluid collection or hematomas.   Consults: None  Disposition: Discharge disposition: 01-Home or Self Care       Discharge Instructions    Diet - low sodium heart healthy   Complete by: As directed      Allergies as of 08/28/2019      Reactions   Percocet [oxycodone-acetaminophen] Other (See Comments)   Arms and legs go numb   Sulfa Antibiotics Nausea And Vomiting      Medication List    TAKE these medications   diazepam 2 MG tablet Commonly known as: Valium Take 1 tablet (2 mg total) by mouth every 12 (twelve) hours as needed for muscle spasms.   Emgality 120 MG/ML Soaj Generic drug: Galcanezumab-gnlm Inject 120 mg into the skin every 28 (twenty-eight) days.   hydrochlorothiazide 12.5 MG capsule Commonly known as: Microzide Take 1 capsule (12.5 mg total) by mouth daily.   ibuprofen 200 MG  tablet Commonly known as: ADVIL Take 400 mg by mouth every 6 (six) hours as needed for headache or moderate pain.   imipramine 50 MG tablet Commonly known as: TOFRANIL Take 50 mg by mouth at bedtime.   ketorolac 10 MG tablet Commonly known as: TORADOL Take 1 tablet (10 mg total) by mouth every 8 (eight) hours.   metoprolol succinate 100 MG 24 hr tablet Commonly known as: TOPROL-XL Take 100 mg by mouth at bedtime.   MULTIVITAMIN PO Take 1 tablet by mouth daily.   omeprazole 20 MG capsule Commonly known as: PRILOSEC Take 20 mg by mouth daily.   ondansetron 4 MG tablet Commonly known as: Zofran Take 1 tablet (4 mg total) by mouth every 8 (eight) hours as needed for nausea or vomiting.   ondansetron 4 MG tablet Commonly known as: Zofran Take 1 tablet (4 mg total) by mouth every 8 (eight) hours as needed for nausea or vomiting.   rizatriptan 10 MG tablet Commonly known as: MAXALT Take 10 mg by mouth as needed for migraine.   venlafaxine XR 150 MG 24 hr capsule Commonly known as: EFFEXOR-XR TAKE 1 CAPSULE BY MOUTH EVERY DAY WITH BREAKFAST What changed: See the new instructions.       Follow-up Information    Dillingham, Loel Lofty, DO In 1 week.   Specialty: Plastic Surgery Contact information: [place]  [address] [place] [address] [phone]        Herbert Pun, MD In 2 weeks.   Specialty: General Surgery Contact information: [address] [place] [address] [phone]

## 2019-08-28 NOTE — Progress Notes (Addendum)
Pt ambulated in the hallway. Completed 1x lap with minimum assist, without assistive devices. No complaints of lightheadedness or dizziness. Pt will be discharge today. Pt decline any assistive devices for home use.

## 2019-08-28 NOTE — Progress Notes (Signed)
Discharge paperwork was reviewed with the family. Family was receptive and did not have any questions. Supplies were provided for JP management and surgical site care. PIV was removed. Pt ws discharged at 1502. Pt was escorted off the unit via wheelchair accompanied home with husband and daughter.

## 2019-08-30 ENCOUNTER — Other Ambulatory Visit: Payer: Self-pay | Admitting: Pathology

## 2019-08-30 LAB — SURGICAL PATHOLOGY

## 2019-08-30 NOTE — Progress Notes (Signed)
Phoned patient to check on  post-op status. Some discomfort,but controlled with medication.  States she has good support.  Confirmed follow-up appointments.

## 2019-09-04 ENCOUNTER — Encounter: Payer: Self-pay | Admitting: Plastic Surgery

## 2019-09-04 ENCOUNTER — Ambulatory Visit (INDEPENDENT_AMBULATORY_CARE_PROVIDER_SITE_OTHER): Payer: BC Managed Care – PPO | Admitting: Plastic Surgery

## 2019-09-04 ENCOUNTER — Other Ambulatory Visit: Payer: Self-pay

## 2019-09-04 VITALS — BP 113/75 | HR 75 | Temp 98.1°F

## 2019-09-04 DIAGNOSIS — Z170 Estrogen receptor positive status [ER+]: Secondary | ICD-10-CM

## 2019-09-04 DIAGNOSIS — C50411 Malignant neoplasm of upper-outer quadrant of right female breast: Secondary | ICD-10-CM

## 2019-09-04 DIAGNOSIS — Z9013 Acquired absence of bilateral breasts and nipples: Secondary | ICD-10-CM

## 2019-09-04 NOTE — Progress Notes (Signed)
   Subjective:    Patient ID: Tiffany Acevedo, female    DOB: Apr 08, 1965, 54 y.o.   MRN: [id number]  The patient is a 54 year old female here for follow-up after undergoing bilateral mastectomies with immediate reconstruction.  She had expanders and Flex HD placed.  This was done 1 week ago in [place].  Her drain output has been minimal.  She does not have any sign of seroma or hematoma.  Her incisions are a little bruised but appear to be healing.  There is no sign of infection.  She had 150 cc placed in the operating room in each expander.   Review of Systems  Constitutional: Positive for activity change.  Eyes: Negative.   Cardiovascular: Negative.        Objective:   Physical Exam Vitals and nursing note reviewed.  Constitutional:      Appearance: Normal appearance.  Cardiovascular:     Rate and Rhythm: Normal rate.  Pulmonary:     Effort: Pulmonary effort is normal.  Neurological:     General: No focal deficit present.     Mental Status: She is alert. Mental status is at baseline.  Psychiatric:        Mood and Affect: Mood normal.        Behavior: Behavior normal.        Assessment & Plan:     ICD-10-CM   1. Malignant neoplasm of upper-outer quadrant of right breast in female, estrogen receptor positive ([place])  C50.411    Z17.0   2. Acquired absence of bilateral breasts and nipples  Z90.13     I will plan to remove the drains next week.  She can go into a sports bra.  She does not need to continue wearing the padding and she can shower. We placed injectable saline in the Expander using a sterile technique: Right: 50 cc for a total of 200 / 535 cc Left: 50 cc for a total of 200 / 535 cc

## 2019-09-09 NOTE — Progress Notes (Signed)
Patient is a 54 yr-old female here for follow-up after undergoing bilateral mastectomies with Dr. Windell Moment and immediate reconstruction with Dr. Marla Roe on 08/27/19.   ~ 2 weeks PO Patient reports she is doing well overall. Incisions are healing well, c/d/i. Dermabond present. No signs of infection, redness, drainage, seroma/hematoma.  Drain output on the right has been very low for the past week. Drain output on the left side has been very low (2-10 cc) except for last night (~28cc) when a clot came out. Output today has been negligible. Drains removed today.  Patient denies fever, chills, nausea/vomiting.  Reports she tolerated her last fill well and would like an additional fill today.  She would like to be around a C cup like she was prior to surgery.  We placed injectable saline in the Expander using a sterile technique: Right: 50 cc for a total of 250 / 535 cc Left: 50 cc for a total of 250 / 535 cc  May shower, avoid direct spray on incisions or drain sites.  Continue to wear sports bra 24/7 until 6 weeks postop.  Continue to sleep on your back.  Apply Vaseline and gauze or Band-Aid to the drain sites until closed.  May apply Vaseline or unscented moisturizer to the incisions and breasts.  Continue to avoid heavy lifting.  Follow-up in 2 weeks for an additional fill.  Call office with any questions/concerns.  The [place] was signed into law in 2016 which includes the topic of electronic health records.  This provides immediate access to information in MyChart.  This includes consultation notes, operative notes, office notes, lab results and pathology reports.  If you have any questions about what you read please let us know at your next visit or call us at the office.  We are right here with you.

## 2019-09-10 ENCOUNTER — Encounter: Payer: Self-pay | Admitting: General Surgery

## 2019-09-11 ENCOUNTER — Encounter: Payer: Self-pay | Admitting: Plastic Surgery

## 2019-09-11 ENCOUNTER — Ambulatory Visit (INDEPENDENT_AMBULATORY_CARE_PROVIDER_SITE_OTHER): Payer: BC Managed Care – PPO | Admitting: Plastic Surgery

## 2019-09-11 ENCOUNTER — Other Ambulatory Visit: Payer: Self-pay

## 2019-09-11 VITALS — BP 92/55 | HR 83 | Temp 99.1°F

## 2019-09-11 DIAGNOSIS — Z9013 Acquired absence of bilateral breasts and nipples: Secondary | ICD-10-CM

## 2019-09-11 DIAGNOSIS — Z170 Estrogen receptor positive status [ER+]: Secondary | ICD-10-CM

## 2019-09-11 DIAGNOSIS — C50411 Malignant neoplasm of upper-outer quadrant of right female breast: Secondary | ICD-10-CM

## 2019-09-20 ENCOUNTER — Other Ambulatory Visit: Payer: Self-pay

## 2019-09-20 ENCOUNTER — Encounter: Payer: Self-pay | Admitting: Oncology

## 2019-09-20 ENCOUNTER — Inpatient Hospital Stay: Payer: BC Managed Care – PPO | Attending: Oncology | Admitting: Oncology

## 2019-09-20 VITALS — BP 105/71 | HR 64 | Temp 96.6°F | Resp 16 | Wt 153.7 lb

## 2019-09-20 DIAGNOSIS — Z7189 Other specified counseling: Secondary | ICD-10-CM

## 2019-09-20 DIAGNOSIS — Z9013 Acquired absence of bilateral breasts and nipples: Secondary | ICD-10-CM | POA: Insufficient documentation

## 2019-09-20 DIAGNOSIS — C50411 Malignant neoplasm of upper-outer quadrant of right female breast: Secondary | ICD-10-CM | POA: Diagnosis not present

## 2019-09-20 DIAGNOSIS — M79622 Pain in left upper arm: Secondary | ICD-10-CM | POA: Diagnosis not present

## 2019-09-20 DIAGNOSIS — Z170 Estrogen receptor positive status [ER+]: Secondary | ICD-10-CM | POA: Diagnosis not present

## 2019-09-20 NOTE — Progress Notes (Signed)
Hematology/Oncology Consult note [facility]  Telephone:([phone] [phone]  Patient Care Team: Marinda Elk, MD as PCP - General (Physician Assistant) Theodore Demark, RN as Oncology Nurse Navigator   Name of the patient: Tiffany Acevedo  [id number]  1965/10/20   Date of visit: 09/20/19  Diagnosis-pathological prognostic stage Ia invasive mammary carcinoma of the right breast pT1 apN0 cM0 ER/PR positive HER-2 negative s/p bilateral mastectomy  Chief complaint/ Reason for visit-discuss pathology results and further management  Heme/Onc history: Patient is a 54 year old female with past medical history is significant for anxiety disorder who recently underwent a screening mammogram on 06/18/2019 which showed suspicious calcifications in the right breast. This was followed by diagnostic mammogram which showed numerous groups of calcifications spanning at least 6.4 cm. She had a biopsy of the medialmost and lateralmost calcification. The medialmost calcification showed invasive mammary carcinoma grade 1 ER/PR positive and HER-2 negative. The lateralmost calcification was negative for atypia and malignancy and showed fibrocystic changes. Patient has met with Dr. Peyton Najjar and is leaning towards at least a unilateral mastectomy. She also underwent bilateral MRI which did not show any enhancement even at the site of biopsy-proven malignancy or elsewhere. No MRI evidence of malignancy in the left breast.  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  3 sentinel lymph nodes negative for malignancy.  No evidence of malignancy or DCIS in the left breast.  Interval history-patient reports pain in her left axilla since the surgery which makes shoulder movements at times painful  ECOG PS-  0 Pain scale- 3   Review of systems- Review of Systems  Constitutional: Negative for chills, fever, malaise/fatigue and weight loss.  HENT: Negative for congestion, ear discharge and nosebleeds.   Eyes: Negative for blurred vision.  Respiratory: Negative for cough, hemoptysis, sputum production, shortness of breath and wheezing.   Cardiovascular: Negative for chest pain, palpitations, orthopnea and claudication.  Gastrointestinal: Negative for abdominal pain, blood in stool, constipation, diarrhea, heartburn, melena, nausea and vomiting.  Genitourinary: Negative for dysuria, flank pain, frequency, hematuria and urgency.  Musculoskeletal: Negative for back pain, joint pain and myalgias.       Pain in the left axilla post surgery  Skin: Negative for rash.  Neurological: Negative for dizziness, tingling, focal weakness, seizures, weakness and headaches.  Endo/Heme/Allergies: Does not bruise/bleed easily.  Psychiatric/Behavioral: Negative for depression and suicidal ideas. The patient does not have insomnia.        Allergies  Allergen Reactions  . Percocet [Oxycodone-Acetaminophen] Other (See Comments)    Arms and legs go numb  . Sulfa Antibiotics Nausea And Vomiting     Past Medical History:  Diagnosis Date  . Allergy   . Anxiety   . Depression   . Excessive weight gain 06/30/2016  . Family history of colon cancer   . Family history of colon cancer   . Family history of prostate cancer   . Frequent headaches   . GERD (gastroesophageal reflux disease)   . History of kidney stones   . Hypertension   . Migraines   . Pre-diabetes      Past Surgical History:  Procedure Laterality Date  . BREAST BIOPSY Right 07/13/2019   stereo biopsy/ x clip/path pending  . BREAST BIOPSY Right 07/13/2019   stereo biopsy/coil clip/ path pending  . BREAST RECONSTRUCTION  WITH PLACEMENT OF TISSUE EXPANDER AND FLEX HD (ACELLULAR HYDRATED DERMIS) Bilateral 08/27/2019   Procedure: BILATERAL  BREAST RECONSTRUCTION WITH PLACEMENT OF TISSUE EXPANDER AND FLEX HD (ACELLULAR HYDRATED DERMIS);  Surgeon: Wallace Going, DO;  Location: [facility] ORS;  Service: Plastics;  Laterality: Bilateral;  . [place], 2004, 2013  . COLONOSCOPY  2015  . CYSTOSCOPY W/ RETROGRADES Right 09/13/2018   Procedure: CYSTOSCOPY WITH RETROGRADE PYELOGRAM;  Surgeon: Abbie Sons, MD;  Location: [facility];  Service: Urology;  Laterality: Right;  . CYSTOSCOPY/URETEROSCOPY/HOLMIUM LASER/STENT PLACEMENT Left 05/09/2018   Procedure: CYSTOSCOPY/URETEROSCOPY/HOLMIUM LASER/STENT PLACEMENT;  Surgeon: Abbie Sons, MD;  Location: [facility] OR[facility];  Service: Urology;  Laterality: Left;  . CYSTOSCOPY/URETEROSCOPY/HOLMIUM LASER/STENT PLACEMENT Right 09/13/2018   Procedure: CYSTOSCOPY/URETEROSCOPY/HOLMIUM LASER/STENT PLACEMENT;  Surgeon: Abbie Sons, MD;  Location: [facility] OR[facility];  Service: Urology;  Laterality: Right;  . INCONTINENCE SURGERY     BLADDER TUCK  . PILONIDAL CYST EXCISION    . STONE EXTRACTION WITH BASKET Right 09/13/2018   Procedure: STONE EXTRACTION WITH BASKET;  Surgeon: Abbie Sons, MD;  Location: [facility];  Service: Urology;  Laterality: Right;  . TOTAL MASTECTOMY Bilateral 08/27/2019   Procedure: TOTAL MASTECTOMY;  Surgeon: Herbert Pun, MD;  Location: [facility];  Service: General;  Laterality: Bilateral;  START @930  DUE TO SENTINEL NODE  . UPPER GI ENDOSCOPY      Social History   Socioeconomic History  . Marital status: Married    Spouse name: Not on file  . Number of children: Not on file  . Years of education: Not on file  . Highest education level: Not on file  Occupational History  . Not on file  Tobacco Use  . Smoking status: Never Smoker  . Smokeless tobacco: Never Used  Vaping Use  . Vaping Use: Never used  Substance and Sexual Activity  . Alcohol use: Yes    Comment: glass of wine occassionally  . Drug use: No  . Sexual activity: Yes    Partners: Male  Other  Topics Concern  . Not on file  Social History Narrative  . Not on file   Social Determinants of Health   Financial Resource Strain:   . Difficulty of Paying Living Expenses:   Food Insecurity:   . Worried About Charity fundraiser in the Last Year:   . Arboriculturist in the Last Year:   Transportation Needs:   . Film/video editor (Medical):   Marland Kitchen Lack of Transportation (Non-Medical):   Physical Activity:   . Days of Exercise per Week:   . Minutes of Exercise per Session:   Stress:   . Feeling of Stress :   Social Connections:   . Frequency of Communication with Friends and Family:   . Frequency of Social Gatherings with Friends and Family:   . Attends Religious Services:   . Active Member of Clubs or Organizations:   . Attends Archivist Meetings:   Marland Kitchen Marital Status:   Intimate Partner Violence:   . Fear of Current or Ex-Partner:   . Emotionally Abused:   Marland Kitchen Physically Abused:   . Sexually Abused:     Family History  Problem Relation Age of Onset  . Hyperlipidemia Mother   . Hypertension Mother   . Depression Mother   . Colon cancer Mother 4  . Alcohol abuse Father   . Hyperlipidemia Father   . Stroke Father   . Hypertension Father   . Depression Father   .  Heart disease Father   . Prostate cancer Father 88  . Arthritis Maternal Grandmother   . Hypertension Maternal Grandmother   . Depression Maternal Grandmother   . Arthritis Maternal Grandfather   . Stroke Maternal Grandfather   . Hypertension Maternal Grandfather   . Depression Maternal Grandfather   . Hypertension Paternal Grandmother   . Depression Paternal Grandmother   . Hypertension Paternal Grandfather   . Depression Paternal Grandfather   . Leukemia Maternal Aunt        d <50  . Cancer Paternal Aunt        unsure types     Current Outpatient Medications:  .  EMGALITY 120 MG/ML SOAJ, Inject 120 mg into the skin every 28 (twenty-eight) days., Disp: , Rfl:  .  hydrochlorothiazide  (MICROZIDE) 12.5 MG capsule, Take 1 capsule (12.5 mg total) by mouth daily., Disp: 30 capsule, Rfl: 2 .  ibuprofen (ADVIL,MOTRIN) 200 MG tablet, Take 400 mg by mouth every 6 (six) hours as needed for headache or moderate pain. , Disp: , Rfl:  .  imipramine (TOFRANIL) 50 MG tablet, Take 50 mg by mouth at bedtime. , Disp: , Rfl:  .  metoprolol succinate (TOPROL-XL) 100 MG 24 hr tablet, Take 100 mg by mouth at bedtime. , Disp: , Rfl:  .  Multiple Vitamin (MULTIVITAMIN PO), Take 1 tablet by mouth daily., Disp: , Rfl:  .  omeprazole (PRILOSEC) 20 MG capsule, Take 20 mg by mouth daily. , Disp: , Rfl:  .  rizatriptan (MAXALT) 10 MG tablet, Take 10 mg by mouth as needed for migraine. , Disp: , Rfl:  .  venlafaxine XR (EFFEXOR-XR) 150 MG 24 hr capsule, TAKE 1 CAPSULE BY MOUTH EVERY DAY WITH BREAKFAST (Patient taking differently: Take 150 mg by mouth daily with breakfast. ), Disp: 30 capsule, Rfl: 5 .  diazepam (VALIUM) 2 MG tablet, Take 1 tablet (2 mg total) by mouth every 12 (twelve) hours as needed for muscle spasms. (Patient not taking: Reported on 09/11/2019), Disp: 20 tablet, Rfl: 0 .  ketorolac (TORADOL) 10 MG tablet, Take 1 tablet (10 mg total) by mouth every 8 (eight) hours. (Patient not taking: Reported on 09/20/2019), Disp: 6 tablet, Rfl: 0  Current Facility-Administered Medications:  .  ciprofloxacin (CIPRO) tablet 500 mg, 500 mg, Oral, Once, Stoioff, Scott C, MD .  lidocaine (XYLOCAINE) 2 % jelly 1 application, 1 application, Urethral, Once, Stoioff, Scott C, MD  Physical exam:  Vitals:   09/20/19 1010  BP: 105/71  Pulse: 64  Resp: 16  Temp: (!) 96.6 F (35.9 C)  TempSrc: Tympanic  SpO2: 100%  Weight: 153 lb 11.2 oz (69.7 kg)   Physical Exam Constitutional:      General: She is not in acute distress. Pulmonary:     Effort: Pulmonary effort is normal.  Skin:    General: Skin is warm and dry.  Neurological:     Mental Status: She is alert and oriented to person, place, and time.    Patient is s/p bilateral mastectomy with reconstruction.  Surgical scar is healing well and no evidence of postoperative inflammation or infection  CMP Latest Ref Rng & Units 09/14/2018  Glucose 70 - 99 mg/dL 124(H)  BUN 6 - 20 mg/dL 20  Creatinine 0.44 - 1.00 mg/dL 0.98  Sodium 135 - 145 mmol/L 141  Potassium 3.5 - 5.1 mmol/L 3.7  Chloride 98 - 111 mmol/L 106  CO2 22 - 32 mmol/L 25  Calcium 8.9 - 10.3 mg/dL 9.5  Total Protein 6.5 - 8.1 g/dL 7.6  Total Bilirubin 0.3 - 1.2 mg/dL 0.5  Alkaline Phos 38 - 126 U/L 75  AST 15 - 41 U/L 26  ALT 0 - 44 U/L 36   CBC Latest Ref Rng & Units 09/14/2018  WBC 4.0 - 10.5 K/uL 17.7(H)  Hemoglobin 12.0 - 15.0 g/dL 12.8  Hematocrit 36 - 46 % 38.9  Platelets 150 - 400 K/uL 257    No images are attached to the encounter.  NM SENTINEL NODE INJECTION  Result Date: 08/27/2019 CLINICAL DATA:  Right breast cancer. EXAM: NUCLEAR MEDICINE BREAST LYMPHOSCINTIGRAPHY TECHNIQUE: Intradermal injection of radiopharmaceutical was performed at the 12 o'clock, 3 o'clock, 6 o'clock, and 9 o'clock positions around the right nipple. The patient was then sent to the operating room where the sentinel node(s) were identified and removed by the surgeon. RADIOPHARMACEUTICALS:  Total of 0.666 mCi Millipore-filtered Technetium-21msulfur colloid. IMPRESSION: Uncomplicated intradermal injection of a total of 0.666 mCi Technetium-920mulfur colloid for purposes of sentinel node identification. Electronically Signed   By: AdMarkus Daft.D.   On: 08/27/2019 09:04     Assessment and plan- Patient is a [age].o. 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  Patient was noted to have calcification span of 6.4 cm on her diagnostic mammogram.  Patient is now bilateral  mastectomy and was found to have 3.8 mm grade 1 invasive mammary carcinoma in the right breast.  No evidence of atypia or malignancy in the left breast.   Given that patient has had a bilateral mastectomy she does not require adjuvant radiation treatment.  Also for a tumor that this less than 10 mm and grade 1 Oncotype testing is not indicated to decide if she needs adjuvant chemotherapy.  Given that her tumor was ER/PR positive and HER-2 negative, there is some benefit of adjuvant hormone therapy for 5 years despite a bilateral mastectomy.  Patient is still premenopausal and tamoxifen is worth a consideration.  Patient is on imipramine and Effexor and both of these medications do not interact with tamoxifen.  Discussed risks and benefits of tamoxifen including all but not limited to hot flashes, mood swings, arthralgias and risk of DVT and uterine cancer.  Patient understands and is willing to try tamoxifen.  Treatment will be given with a curative intent.  I will see her back in 2 months with a CMP.  Moving forward in terms of surveillance patient would not need any surveillance tumor markers or scans.  She will need periodic chest wall exams and scans will be based on suspicious signs and symptoms  I suspect patient's left axillary pain is secondary to mastectomy and she may benefit from OT referral to [place]alignant neoplasm of upper-outer quadrant of right breast in female, estrogen receptor positive ([place]: Breast, AJCC 8th Edition - Pathologic stage from 09/20/2019: Stage IA (pT1a, pN0, cM0, G1, ER+, PR+, HER2-) - Signed by RaSindy GuadeloupeMD on 09/20/2019     Visit Diagnosis 1. Malignant neoplasm of upper-outer quadrant of right breast in female, estrogen receptor positive ([place]  2. Goals of care, counseling/discussion      Dr. ArRanda EvensMD, MPH [id number]/01/2020 3:40 PM

## 2019-09-25 ENCOUNTER — Ambulatory Visit (INDEPENDENT_AMBULATORY_CARE_PROVIDER_SITE_OTHER): Payer: BC Managed Care – PPO | Admitting: Plastic Surgery

## 2019-09-25 ENCOUNTER — Other Ambulatory Visit: Payer: Self-pay

## 2019-09-25 ENCOUNTER — Telehealth: Payer: Self-pay | Admitting: Oncology

## 2019-09-25 ENCOUNTER — Other Ambulatory Visit: Payer: Self-pay | Admitting: *Deleted

## 2019-09-25 ENCOUNTER — Encounter: Payer: Self-pay | Admitting: Plastic Surgery

## 2019-09-25 VITALS — BP 106/69 | HR 68 | Temp 98.5°F

## 2019-09-25 DIAGNOSIS — Z9013 Acquired absence of bilateral breasts and nipples: Secondary | ICD-10-CM

## 2019-09-25 MED ORDER — TAMOXIFEN CITRATE 20 MG PO TABS
20.0000 mg | ORAL_TABLET | Freq: Every day | ORAL | 2 refills | Status: DC
Start: 2019-09-25 — End: 2019-11-27

## 2019-09-25 NOTE — Telephone Encounter (Signed)
I called pt back about her appt tom. For the screening visit with [facility]. I explained what maureen was going to do but she just wanted to see if she can have it changed to another date. 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 She would like to have it rescheduled next week. She is good with anytime next week. I told her that I would send message to scheduler and ask them to send my chart message with new date. If no open dates for next week then we will go to 2 weeks out. Pt says that she can't see the appt on my chart automatically so that is why we will need to send my chart message with new date

## 2019-09-25 NOTE — Telephone Encounter (Signed)
Pt would like a call to discuss a physical therapy appt you all made for her tomorrow.

## 2019-09-25 NOTE — Progress Notes (Signed)
The patient is a 57 after undergoing bilateral mastectomies with expander placement.  We had expanded at her last visit.  She has 250 cc in each expander.  Today she has a little bit of separation at the incision line.  It is on the medial aspect of the right breast in the lateral aspect of the left breast.  No sign of infection.  No sign of hematoma or seroma.  I am not going to expand today to not put any pressure on the incision site.  Donated ACell was applied to the left side.  The patient was instructed to do Vaseline to the right breast incision and [place] to the left breast incision.  She is to do the [place] until Friday.  Friday she can shower and then continue with the [facility] on the left and the Vaseline on the right.  I like to see her back in 1 week.

## 2019-09-26 NOTE — Telephone Encounter (Signed)
After reviewing the appt it was for 8/25. I called pt back and she said that date is fine. Also she said she did not see where tamoxifen was sent in. I sent it in and confirmed that it went through and let pt know also

## 2019-09-29 ENCOUNTER — Encounter: Payer: Self-pay | Admitting: Surgical

## 2019-09-29 NOTE — Progress Notes (Signed)
Patient is a 34 old female that underwent bilateral breast reconstruction and currently has expanders in place. She reports that she was at the office 4 days ago and it was noted that she had an opening over the left breast. Acell was applied. she showered yesterday and then noticed this morning that she had an opening on the right breast as well. She reports the right breast opening is larger and she can see some yellowish drainage. Sh e is not sure if the expander is exposed or not. She does not have any redness of the surrounding skin or has not noticed any foul odors. She otherwise feels well. I recommended she cover the incisions with Vaseline and gauze and if Able purchase some Xeroform and apply this once to twice daily depending on drainage. She has a follow up scheduled for Tuesday next week. I informed her to call back with any questions or concerns and I wou ld be happy to discuss. I also informed her of all if you noticed any changes or developed any concerning symptoms.

## 2019-10-01 NOTE — Progress Notes (Signed)
Patient is a 54 year old female here for follow-up after undergoing bilateral mastectomies with Dr. Windell Moment and immediate reconstruction with Dr. Marla Roe on 08/27/2019.  At her last visit on 8/17 there was a little bit of separation at the incision line on the medial aspect of the right breast and on the lateral aspect of the left breast.  Donated a cell was applied to the left side.  ~ 5 weeks PO Today patient presents with her niece. Bilateral separation at the incision lines have increased in size. Two additional spots have opened along the left incision line. Erythema surrounding bilateral wounds with mild drainage present. No signs of infection. Denies F/C/N/V.   We placed injectable saline in the Expander using a sterile technique: Right: 0 cc for a total of 250 / 535 cc Left: 0 cc for a total of 250 / 535 cc  Plan for closure of bilateral incisions Friday. Do daily dressing changes until then consisting of vaseline and gauze. Call office with any questions/concerns.      Pictures were obtained of the patient and placed in the chart with the patient's or guardian's permission.  The [place] was signed into law in 2016 which includes the topic of electronic health records.  This provides immediate access to information in MyChart.  This includes consultation notes, operative notes, office notes, lab results and pathology reports.  If you have any questions about what you read please let us know at your next visit or call us at the office.  We are right here with you.

## 2019-10-02 ENCOUNTER — Other Ambulatory Visit: Payer: Self-pay

## 2019-10-02 ENCOUNTER — Ambulatory Visit (INDEPENDENT_AMBULATORY_CARE_PROVIDER_SITE_OTHER): Payer: BC Managed Care – PPO | Admitting: Plastic Surgery

## 2019-10-02 ENCOUNTER — Encounter: Payer: Self-pay | Admitting: Plastic Surgery

## 2019-10-02 VITALS — BP 116/66 | HR 79 | Temp 98.2°F

## 2019-10-02 DIAGNOSIS — C50411 Malignant neoplasm of upper-outer quadrant of right female breast: Secondary | ICD-10-CM

## 2019-10-02 DIAGNOSIS — Z9013 Acquired absence of bilateral breasts and nipples: Secondary | ICD-10-CM

## 2019-10-02 DIAGNOSIS — Z170 Estrogen receptor positive status [ER+]: Secondary | ICD-10-CM

## 2019-10-03 ENCOUNTER — Inpatient Hospital Stay: Payer: BC Managed Care – PPO | Admitting: Occupational Therapy

## 2019-10-05 ENCOUNTER — Ambulatory Visit (INDEPENDENT_AMBULATORY_CARE_PROVIDER_SITE_OTHER): Payer: BC Managed Care – PPO | Admitting: Plastic Surgery

## 2019-10-05 ENCOUNTER — Encounter: Payer: Self-pay | Admitting: Plastic Surgery

## 2019-10-05 ENCOUNTER — Other Ambulatory Visit: Payer: Self-pay

## 2019-10-05 VITALS — BP 115/67 | HR 69 | Temp 98.7°F

## 2019-10-05 DIAGNOSIS — Z9013 Acquired absence of bilateral breasts and nipples: Secondary | ICD-10-CM

## 2019-10-05 DIAGNOSIS — S21009A Unspecified open wound of unspecified breast, initial encounter: Secondary | ICD-10-CM | POA: Diagnosis not present

## 2019-10-05 NOTE — Progress Notes (Signed)
Procedure Note  Preoperative Dx: Open incision bilateral breasts  Postoperative Dx: Same  Procedure: Excision of open bilateral breast and primary closure 0.5 x 3 cm  Anesthesia: Lidocaine 1% with 1:100,000 epinepherine   Description of Procedure: Risks and complications were explained to the patient.  Consent was confirmed and the patient understands the risks and benefits.  The potential complications and alternatives were explained and the patient consents.  The patient expressed understanding the option of not having the procedure and the risks of a scar.  Time out was called and all information was confirmed to be correct.    The area was prepped and drapped.  Lidocaine 1% with epinepherine was injected in the subcutaneous area.  After waiting several minutes for the local to take affect a #15 blade was used to excise the area in an eliptical pattern.  A 4-0 Monocryl was used to close the deep layers with simple interrupted stitches.  The skin edges were reapproximated with 5-0 Monocryl subcuticular running closure.  Dermabond and a dressing was applied.  The patient was given instructions on how to care for the area and a follow up appointment.  Tiffany Acevedo tolerated the procedure well and there were no complications.

## 2019-10-12 ENCOUNTER — Encounter: Payer: Self-pay | Admitting: Plastic Surgery

## 2019-10-12 ENCOUNTER — Other Ambulatory Visit: Payer: Self-pay

## 2019-10-12 ENCOUNTER — Ambulatory Visit (INDEPENDENT_AMBULATORY_CARE_PROVIDER_SITE_OTHER): Payer: BC Managed Care – PPO | Admitting: Plastic Surgery

## 2019-10-12 VITALS — BP 137/80 | HR 71 | Temp 98.7°F

## 2019-10-12 DIAGNOSIS — S21009A Unspecified open wound of unspecified breast, initial encounter: Secondary | ICD-10-CM

## 2019-10-12 NOTE — Progress Notes (Signed)
The patient is a 54 year old female here for follow-up after undergoing debridement and excision and closure of her breast wounds last week.  They are closed and looking much better.  We will wait to do any expansion until next week.  There is no sign of infection and she is feeling much better.

## 2019-10-17 ENCOUNTER — Other Ambulatory Visit: Payer: Self-pay

## 2019-10-17 ENCOUNTER — Inpatient Hospital Stay: Payer: BC Managed Care – PPO | Attending: Oncology | Admitting: Occupational Therapy

## 2019-10-17 DIAGNOSIS — M25612 Stiffness of left shoulder, not elsewhere classified: Secondary | ICD-10-CM

## 2019-10-17 DIAGNOSIS — M25611 Stiffness of right shoulder, not elsewhere classified: Secondary | ICD-10-CM

## 2019-10-17 NOTE — Therapy (Deleted)
[facility] [address] [place], [place], [place], [address] Phone: [phone]   Fax:  [phone]  Occupational Therapy Screen  Patient Details  Name: Tiffany Acevedo MRN: [id number] Date of Birth: [birth year] No data recorded  Encounter Date: 10/17/2019   OT End of Session - 10/17/19 1049    Visit Number 0           Past Medical History:  Diagnosis Date  . Allergy   . Anxiety   . Depression   . Excessive weight gain 06/30/2016  . Family history of colon cancer   . Family history of colon cancer   . Family history of prostate cancer   . Frequent headaches   . GERD (gastroesophageal reflux disease)   . History of kidney stones   . Hypertension   . Migraines   . Pre-diabetes     Past Surgical History:  Procedure Laterality Date  . BREAST BIOPSY Right 07/13/2019   stereo biopsy/ x clip/path pending  . BREAST BIOPSY Right 07/13/2019   stereo biopsy/coil clip/ path pending  . BREAST RECONSTRUCTION WITH PLACEMENT OF TISSUE EXPANDER AND FLEX HD (ACELLULAR HYDRATED DERMIS) Bilateral 08/27/2019   Procedure: BILATERAL BREAST RECONSTRUCTION WITH PLACEMENT OF TISSUE EXPANDER AND FLEX HD (ACELLULAR HYDRATED DERMIS);  Surgeon: Wallace Going, DO;  Location: [facility];  Service: Plastics;  Laterality: Bilateral;  . [place], 2004, 2013  . COLONOSCOPY  2015  . CYSTOSCOPY W/ RETROGRADES Right 09/13/2018   Procedure: CYSTOSCOPY WITH RETROGRADE PYELOGRAM;  Surgeon: Abbie Sons, MD;  Location: [facility];  Service: Urology;  Laterality: Right;  . CYSTOSCOPY/URETEROSCOPY/HOLMIUM LASER/STENT PLACEMENT Left 05/09/2018   Procedure: CYSTOSCOPY/URETEROSCOPY/HOLMIUM LASER/STENT PLACEMENT;  Surgeon: Abbie Sons, MD;  Location: [facility];  Service: Urology;  Laterality: Left;  . CYSTOSCOPY/URETEROSCOPY/HOLMIUM LASER/STENT PLACEMENT Right 09/13/2018   Procedure: CYSTOSCOPY/URETEROSCOPY/HOLMIUM LASER/STENT  PLACEMENT;  Surgeon: Abbie Sons, MD;  Location: [facility];  Service: Urology;  Laterality: Right;  . INCONTINENCE SURGERY     BLADDER TUCK  . PILONIDAL CYST EXCISION    . STONE EXTRACTION WITH BASKET Right 09/13/2018   Procedure: STONE EXTRACTION WITH BASKET;  Surgeon: Abbie Sons, MD;  Location: [facility];  Service: Urology;  Laterality: Right;  . TOTAL MASTECTOMY Bilateral 08/27/2019   Procedure: TOTAL MASTECTOMY;  Surgeon: Herbert Pun, MD;  Location: [facility];  Service: General;  Laterality: Bilateral;  START _0  DUE TO SENTINEL NODE  . UPPER GI ENDOSCOPY      There were no vitals filed for this visit.   Subjective Assessment - 10/17/19 1047    Subjective  Doing better -after the last procedure my incision now closing- have appt with Dr Marla Roe Friday - only allowed to raise my hands to shoulder height and not to pick up anything over 1/2 gallon of milk    Currently in Pain? 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  Patient was noted to have calcification span of 6.4 cm on her diagnostic mammogram.  Patient is now bilateral mastectomy and was found to have 3.8 mm grade 1 invasive mammary carcinoma in  the right breast.  No evidence of atypia or malignancy in the left breast.   Given that patient has had a bilateral mastectomy she does not require adjuvant radiation treatment.  Also for a tumor that this less than 10 mm and grade 1 Oncotype testing is not indicated to decide if she needs adjuvant chemotherapy.  Given that her tumor was ER/PR positive and HER-2 negative, there is some benefit of adjuvant hormone therapy for 5 years despite a  bilateral mastectomy.  Patient is still premenopausal and tamoxifen is worth a consideration.  Patient is on imipramine and Effexor and both of these medications do not interact with tamoxifen.  Discussed risks and benefits of tamoxifen including all but not limited to hot flashes, mood swings, arthralgias and risk of DVT and uterine cancer.  Patient understands and is willing to try tamoxifen.  Treatment will be given with a curative intent.  I will see her back in 2 months with a CMP.  Moving forward in terms of surveillance patient would not need any surveillance tumor markers or scans.  She will need periodic chest wall exams and scans will be based on suspicious signs and symptoms  I suspect patient's left axillary pain is secondary to mastectomy and she may benefit from OT referral to [facility]  OT screen on 10/17/2019:                              Patient will benefit from skilled therapeutic intervention in order to improve the following deficits and impairments:           Visit Diagnosis: Stiffness of left shoulder, not elsewhere classified  Stiffness of right shoulder, not elsewhere classified    Problem List Patient Active Problem List   Diagnosis Date Noted  . Breast wound 10/05/2019  . Acquired absence of bilateral breasts and nipples 09/04/2019  . Genetic testing 08/10/2019  . Monoallelic mutation of CHEK2 gene in female patient 08/07/2019  . Malignant neoplasm of upper-outer quadrant of right breast in female, estrogen receptor positive ([place]) 07/30/2019  . Goals of care, counseling/discussion 07/30/2019  . Family history of prostate cancer   . Family history of colon cancer   . Breast cancer ([place]) 07/25/2019  . Left lower quadrant pain 05/23/2018  . Personal history of kidney stones 04/20/2018  . Microscopic hematuria 04/20/2018  . 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 Overweight (BMI 25.0-29.9) 01/22/2017  .  Encounter for preventive health examination 06/30/2016  . Renal colic 29/47/6546  . Ureteral calculus 04/12/2015  . Nephrolithiasis 03/04/2015  . Intractable migraine with aura without status migrainosus 10/29/2013  . Lack of libido 06/24/2013  . Migraine syndrome 03/11/2013  . Generalized anxiety disorder 03/11/2013    Rosalyn Gess 10/17/2019, 10:49 AM  [facility] [address] [place], [place], [place], [address] Phone: [phone]   Fax:  [phone]  Name: Tiffany Acevedo MRN: [id number] Date of Birth: [birth year]

## 2019-10-17 NOTE — Therapy (Signed)
[facility] [address] [place], [place], [place], [address] Phone: [phone]   Fax:  [phone]  Occupational Therapy Screen  Patient Details  Name: Tiffany Acevedo MRN: [id number] Date of Birth: May 04, 1965 No data recorded  Encounter Date: 10/17/2019   OT End of Session - 10/17/19 1049    Visit Number 0           Past Medical History:  Diagnosis Date  . Allergy   . Anxiety   . Depression   . Excessive weight gain 06/30/2016  . Family history of colon cancer   . Family history of colon cancer   . Family history of prostate cancer   . Frequent headaches   . GERD (gastroesophageal reflux disease)   . History of kidney stones   . Hypertension   . Migraines   . Pre-diabetes     Past Surgical History:  Procedure Laterality Date  . BREAST BIOPSY Right 07/13/2019   stereo biopsy/ x clip/path pending  . BREAST BIOPSY Right 07/13/2019   stereo biopsy/coil clip/ path pending  . BREAST RECONSTRUCTION WITH PLACEMENT OF TISSUE EXPANDER AND FLEX HD (ACELLULAR HYDRATED DERMIS) Bilateral 08/27/2019   Procedure: BILATERAL BREAST RECONSTRUCTION WITH PLACEMENT OF TISSUE EXPANDER AND FLEX HD (ACELLULAR HYDRATED DERMIS);  Surgeon: Wallace Going, DO;  Location: [facility];  Service: Plastics;  Laterality: Bilateral;  . [place], 2004, 2013  . COLONOSCOPY  2015  . CYSTOSCOPY W/ RETROGRADES Right 09/13/2018   Procedure: CYSTOSCOPY WITH RETROGRADE PYELOGRAM;  Surgeon: Abbie Sons, MD;  Location: [facility];  Service: Urology;  Laterality: Right;  . CYSTOSCOPY/URETEROSCOPY/HOLMIUM LASER/STENT PLACEMENT Left 05/09/2018   Procedure: CYSTOSCOPY/URETEROSCOPY/HOLMIUM LASER/STENT PLACEMENT;  Surgeon: Abbie Sons, MD;  Location: [facility];  Service: Urology;  Laterality: Left;  . CYSTOSCOPY/URETEROSCOPY/HOLMIUM LASER/STENT PLACEMENT Right 09/13/2018   Procedure: CYSTOSCOPY/URETEROSCOPY/HOLMIUM LASER/STENT  PLACEMENT;  Surgeon: Abbie Sons, MD;  Location: [facility];  Service: Urology;  Laterality: Right;  . INCONTINENCE SURGERY     BLADDER TUCK  . PILONIDAL CYST EXCISION    . STONE EXTRACTION WITH BASKET Right 09/13/2018   Procedure: STONE EXTRACTION WITH BASKET;  Surgeon: Abbie Sons, MD;  Location: [facility];  Service: Urology;  Laterality: Right;  . TOTAL MASTECTOMY Bilateral 08/27/2019   Procedure: TOTAL MASTECTOMY;  Surgeon: Herbert Pun, MD;  Location: [facility];  Service: General;  Laterality: Bilateral;  START _0  DUE TO SENTINEL NODE  . UPPER GI ENDOSCOPY      There were no vitals filed for this visit.   Subjective Assessment - 10/17/19 1047    Subjective  Doing better -after the last procedure my incision now closing- have appt with Dr Marla Roe Friday - only allowed to raise my hands to shoulder height and not to pick up anything over 1/2 gallon of milk    Currently in Pain? No/denies               LYMPHEDEMA/ONCOLOGY QUESTIONNAIRE - 10/17/19 0001      Right Upper Extremity Lymphedema   15 cm Proximal to Olecranon Process 31 cm    10 cm Proximal to Olecranon Process 28.3 cm    Olecranon Process 25.3 cm    15 cm Proximal to Ulnar Styloid Process 24.4 cm    10 cm Proximal to Ulnar Styloid Process 21.3 cm    Just Proximal to Ulnar Styloid Process 15.7 cm    Across Hand at [employer] 18.8 cm  At [facility] of 2nd Digit 6.4 cm    At [facility] of Thumb 5.8 cm      Left Upper Extremity Lymphedema   15 cm Proximal to Olecranon Process 30.8 cm    10 cm Proximal to Olecranon Process 28.4 cm    Olecranon Process 25 cm    15 cm Proximal to Ulnar Styloid Process 23.5 cm    10 cm Proximal to Ulnar Styloid Process 20.5 cm    Just Proximal to Ulnar Styloid Process 15.5 cm    Across Hand at [employer] 17.8 cm    At [place] of 2nd Digit 6.1 cm    At [facility] of Thumb 5.8 cm              NOTE FROM DR RAO ON 09/20/2019:  Assessment and plan- Patient is a 54 y.o.  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  Patient was noted to have calcification span of 6.4 cm on her diagnostic mammogram.  Patient is now bilateral mastectomy and was found to have 3.8 mm grade 1 invasive mammary carcinoma in the right breast.  No evidence of atypia or malignancy in the left breast.   Given that patient has had a bilateral mastectomy she does not require adjuvant radiation treatment.  Also for a tumor that this less than 10 mm and grade 1 Oncotype testing is not indicated to decide if she needs adjuvant chemotherapy.  Given that her tumor was ER/PR positive and HER-2 negative, there is some benefit of adjuvant hormone therapy for 5 years despite a bilateral mastectomy.  Patient is still premenopausal and tamoxifen is worth a consideration.  Patient is on imipramine and Effexor and both of these medications do not interact with tamoxifen.  Discussed risks and benefits of tamoxifen including all but not limited to hot flashes, mood swings, arthralgias and risk of DVT and uterine cancer.  Patient understands and is willing to try tamoxifen.  Treatment will be given with a curative intent.  I will see her back in 2 months with a CMP.  Moving forward in terms of surveillance patient would not need any surveillance tumor markers or scans.  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 Has dog but husband walk him , she do walk for exercises prior to  onset Has 2 grown children, and 50 and 83 yrs old - watch their sports   Pt since 09/20/2019 had some separation of medial incisions on R , L lateral incision - they did 8/21 some Xeroform - but then 10/05/2019 incision closure were done  Looking great this date - has follow up with Dr Marla Roe office 10/19/2019  Kept pt this date at AROM 90 degrees shoulder flexion - and not picking up anything more than 1/2 gallon Pt ed on lymphedema - show low risk because of only 3 axillary ln removed per pt and no radiation Hand out provided  Circumference in bilateral UE - WNL compare  to each others - will reassess when increasing her activities   Pt AROM to do shoulder flexion and ABD 90 degrees 10 reps   external rotation add with shoulders at 90 degrees 10 reps And scapula squeezes 10 reps   Will contact Dr Eusebio Friendly office after Friday appt - if can start increase AROM and strengthening -                           Patient will benefit from skilled therapeutic intervention in order to improve the following deficits and impairments:           Visit Diagnosis: Stiffness of left shoulder, not elsewhere classified  Stiffness of right shoulder, not elsewhere classified    Problem List Patient Active Problem List   Diagnosis Date Noted  . Breast wound 10/05/2019  . Acquired absence of bilateral breasts and nipples 09/04/2019  . Genetic testing 08/10/2019  . Monoallelic mutation of CHEK2 gene in female patient 08/07/2019  . Malignant neoplasm of upper-outer quadrant of right breast in female, estrogen receptor positive ([place]) 07/30/2019  . Goals of care, counseling/discussion 07/30/2019  . Family history of prostate cancer   . Family history of colon cancer   . Breast cancer ([place]) 07/25/2019  . Left lower quadrant pain 05/23/2018  . Personal history of kidney stones 04/20/2018  . Microscopic hematuria 04/20/2018  . 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 Overweight (BMI 25.0-29.9) 01/22/2017  . Encounter for preventive health examination 06/30/2016  . Renal colic 55/97/4163  . Ureteral calculus 04/12/2015  . Nephrolithiasis 03/04/2015  . Intractable migraine with aura without status migrainosus 10/29/2013  . Lack of libido 06/24/2013  . Migraine syndrome 03/11/2013  . Generalized anxiety disorder 03/11/2013    Rosalyn Gess OTR/L,CLT 10/17/2019, 10:53 AM  [facility] [address] [place], [place], [place], [address] Phone: [phone]   Fax:  [phone]  Name: Tiffany Acevedo MRN: [id number] Date of Birth: [birth year]

## 2019-10-18 NOTE — Progress Notes (Signed)
Patient is a 41 yr0old female here for follow-up after undergoing bilateral mastectomies with Dr. Windell Moment and immediate reconstruction with Dr. Marla Roe on 08/27/19. At 8/17 visit she has some separation at bilateral incision lines. On 8/27 she underwent excision of bilateral breast wounds with primary closure.  ~ 7.5 weeks PO (2 weeks PO wound excision) Patient reports she is feeling well.  Incisions are healing very nicely, C/D/I.  No signs of infection, redness, drainage, seroma/hematoma.  She has some good laxity of skin today.  Denies fever, chest pain, shortness of breath, nausea/vomiting.  She is okay to continue with PT and should take things slowly as tolerated.  We placed injectable saline in the Expander using a sterile technique: Right: 30 cc for a total of 280 / 535 cc Left: 30 cc for a total of 28 ependymoma last note 0 / 535 cc  Follow up in 2 weeks for additional fill.  Call office with any questions/concerns. The [place] was signed into law in 2016 which includes the topic of electronic health records.  This provides immediate access to information in MyChart.  This includes consultation notes, operative notes, office notes, lab results and pathology reports.  If you have any questions about what you read please let us know at your next visit or call us at the office.  We are right here with you.

## 2019-10-19 ENCOUNTER — Other Ambulatory Visit: Payer: Self-pay

## 2019-10-19 ENCOUNTER — Ambulatory Visit (INDEPENDENT_AMBULATORY_CARE_PROVIDER_SITE_OTHER): Payer: BC Managed Care – PPO | Admitting: Plastic Surgery

## 2019-10-19 ENCOUNTER — Encounter: Payer: Self-pay | Admitting: Plastic Surgery

## 2019-10-19 VITALS — BP 112/75 | HR 71 | Temp 98.1°F

## 2019-10-19 DIAGNOSIS — Z9013 Acquired absence of bilateral breasts and nipples: Secondary | ICD-10-CM

## 2019-10-22 ENCOUNTER — Encounter: Payer: Self-pay | Admitting: Plastic Surgery

## 2019-10-23 ENCOUNTER — Encounter: Payer: Self-pay | Admitting: Occupational Therapy

## 2019-10-24 ENCOUNTER — Inpatient Hospital Stay: Payer: BC Managed Care – PPO | Admitting: Occupational Therapy

## 2019-10-31 ENCOUNTER — Inpatient Hospital Stay: Payer: BC Managed Care – PPO | Admitting: Occupational Therapy

## 2019-10-31 ENCOUNTER — Other Ambulatory Visit: Payer: Self-pay | Admitting: *Deleted

## 2019-10-31 ENCOUNTER — Other Ambulatory Visit: Payer: Self-pay

## 2019-10-31 DIAGNOSIS — C50411 Malignant neoplasm of upper-outer quadrant of right female breast: Secondary | ICD-10-CM

## 2019-10-31 DIAGNOSIS — M25612 Stiffness of left shoulder, not elsewhere classified: Secondary | ICD-10-CM

## 2019-10-31 DIAGNOSIS — M25611 Stiffness of right shoulder, not elsewhere classified: Secondary | ICD-10-CM

## 2019-10-31 NOTE — Therapy (Signed)
[facility] [address] [place], [place], [place], [address] Phone: [phone]   Fax:  [phone]  Occupational Therapy Screen  Patient Details  Name: Tiffany Acevedo MRN: [id number] Date of Birth: 14-Sep-1965 No data recorded  Encounter Date: 10/31/2019   OT End of Session - 10/31/19 0913    Visit Number 0           Past Medical History:  Diagnosis Date  . Allergy   . Anxiety   . Depression   . Excessive weight gain 06/30/2016  . Family history of colon cancer   . Family history of colon cancer   . Family history of prostate cancer   . Frequent headaches   . GERD (gastroesophageal reflux disease)   . History of kidney stones   . Hypertension   . Migraines   . Pre-diabetes     Past Surgical History:  Procedure Laterality Date  . BREAST BIOPSY Right 07/13/2019   stereo biopsy/ x clip/path pending  . BREAST BIOPSY Right 07/13/2019   stereo biopsy/coil clip/ path pending  . BREAST RECONSTRUCTION WITH PLACEMENT OF TISSUE EXPANDER AND FLEX HD (ACELLULAR HYDRATED DERMIS) Bilateral 08/27/2019   Procedure: BILATERAL BREAST RECONSTRUCTION WITH PLACEMENT OF TISSUE EXPANDER AND FLEX HD (ACELLULAR HYDRATED DERMIS);  Surgeon: Wallace Going, DO;  Location: [facility];  Service: Plastics;  Laterality: Bilateral;  . [place], 2004, 2013  . COLONOSCOPY  2015  . CYSTOSCOPY W/ RETROGRADES Right 09/13/2018   Procedure: CYSTOSCOPY WITH RETROGRADE PYELOGRAM;  Surgeon: Abbie Sons, MD;  Location: [facility];  Service: Urology;  Laterality: Right;  . CYSTOSCOPY/URETEROSCOPY/HOLMIUM LASER/STENT PLACEMENT Left 05/09/2018   Procedure: CYSTOSCOPY/URETEROSCOPY/HOLMIUM LASER/STENT PLACEMENT;  Surgeon: Abbie Sons, MD;  Location: [facility];  Service: Urology;  Laterality: Left;  . CYSTOSCOPY/URETEROSCOPY/HOLMIUM LASER/STENT PLACEMENT Right 09/13/2018   Procedure: CYSTOSCOPY/URETEROSCOPY/HOLMIUM LASER/STENT  PLACEMENT;  Surgeon: Abbie Sons, MD;  Location: [facility];  Service: Urology;  Laterality: Right;  . INCONTINENCE SURGERY     BLADDER TUCK  . PILONIDAL CYST EXCISION    . STONE EXTRACTION WITH BASKET Right 09/13/2018   Procedure: STONE EXTRACTION WITH BASKET;  Surgeon: Abbie Sons, MD;  Location: [facility];  Service: Urology;  Laterality: Right;  . TOTAL MASTECTOMY Bilateral 08/27/2019   Procedure: TOTAL MASTECTOMY;  Surgeon: Herbert Pun, MD;  Location: [facility];  Service: General;  Laterality: Bilateral;  START _0  DUE TO SENTINEL NODE  . UPPER GI ENDOSCOPY      There were no vitals filed for this visit.   Subjective Assessment - 10/31/19 0909    Subjective  My incisions looks good - and is close - they did fill last time both - about 30 cc I think - did not do my exercises as I should have    Currently in Pain? No/denies             [facility] OT Assessment - 10/31/19 0001      AROM   Right Shoulder Extension 60 Degrees    Right Shoulder Flexion 140 Degrees    Right Shoulder ABduction 118 Degrees    Right Shoulder Internal Rotation 90 Degrees    Right Shoulder External Rotation 90 Degrees    Left Shoulder Extension 55 Degrees    Left Shoulder Flexion 132 Degrees    Left Shoulder ABduction 118 Degrees    Left Shoulder Internal Rotation 90 Degrees    Left Shoulder External Rotation 90 Degrees  LYMPHEDEMA/ONCOLOGY QUESTIONNAIRE - 10/31/19 0001      Right Upper Extremity Lymphedema   15 cm Proximal to Olecranon Process 31.5 cm    10 cm Proximal to Olecranon Process 29 cm      Left Upper Extremity Lymphedema   15 cm Proximal to Olecranon Process 31.2 cm    10 cm Proximal to Olecranon Process 29 cm              NOTE FROM DR RAO ON 09/20/2019:  Assessment and plan- Patient is 54 y.o.femalewith CHEK2 mutation and new diagnosis of 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 Patient was noted to have calcification span of 6.4 cm on her diagnostic mammogram. Patient is now bilateral mastectomy and was found to have 3.8 mm grade 1 invasive mammary carcinoma in the right breast. No evidence of atypia or malignancy in the left breast.  Given that patient has had a bilateral mastectomy she does not require adjuvant radiation treatment. Also for a tumor that this less than 10 mm and grade 1 Oncotype testing is not indicated to decide if she needs adjuvant chemotherapy.  Given that her tumor was ER/PR positive and HER-2 negative,there is some benefit of adjuvant hormone therapy for 5 years despite a bilateral mastectomy. Patient is still premenopausal and tamoxifen is worth a consideration. Patient is on imipramine and Effexor and both of these medications do not interact with tamoxifen. Discussed risks and benefits of tamoxifen including all but not limited to hot flashes, mood swings, arthralgias and risk of DVT and uterine cancer. Patient understands and is willing to try tamoxifen. Treatment will be given with a curative intent.  I will see her back in 2 months with a CMP.Moving forward in terms of surveillance patient would not need any surveillance tumor markers or scans. 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 Has dog but husband walk him , she do walk for exercises prior to onset Has 2 grown children, and 25 and 81 yrs old - watch their sports   Pt since 09/20/2019 had some separation of medial incisions on R , L lateral incision -  they did 8/21 some Xeroform - but then 10/05/2019 incision closure were done  Looking great this date - has follow up with Dr Marla Roe office 10/19/2019  Kept pt this date at AROM 90 degrees shoulder flexion - and not picking up anything more than 1/2 gallon Pt ed on lymphedema - show low risk because of only 3 axillary ln removed per pt and no radiation Hand out provided  Circumference in bilateral UE - WNL compare to each others - will reassess when increasing her activities   Pt AROM to do shoulder flexion and ABD 90 degrees 10 reps   external rotation add with shoulders at 90 degrees 10 reps And scapula squeezes 10 reps   Will contact Dr Eusebio Friendly office after Friday appt - if can start increase AROM and strengthening -  NOTE FOR PLASTIC SURGERY 10/19/2019:  Patient is a 7 yr0old female here for follow-up after undergoing bilateral mastectomies with Dr. Windell Moment and immediate reconstruction with Dr. Marla Roe on 08/27/19. At 8/17 visit she has some separation at bilateral incision lines. On 8/27 she underwent excision of bilateral breast wounds with primary closure.  ~ 7.5 weeks PO (2 weeks PO wound excision) Patient reports she is feeling well.  Incisions are healing very nicely, C/D/I.  No signs of infection, redness, drainage, seroma/hematoma.  She has some good laxity of skin today.  Denies fever, chest pain, shortness of breath, nausea/vomiting.  She is okay to continue with PT and should take things slowly as tolerated.  We placed injectable saline in the Expander using a sterile technique: Right: 30 cc for a total of 280 / 535 cc Left: 30 cc for a total of 28 ependymoma last note 0 / 535 cc   10/31/2019 OT NOTE: Pt arrive with orders from Dr Marla Roe office that we can increase AROM in bilateral shoulders as tolerated  Incision close now - Pt show decrease AROM in bilateral shoulders mostly in flexion , ABD and ext  HEP provided and pt tolerated  well : combination nerve glide and scapula squeezes thru out the day few times - 5 reps  AAROM on wall for shoulder flexion 10 reps hold 3 sec  AAROM on wall for ABD 10 reps hold 3 sec - keep opposite shoulder back Supine - bilateral shoulder flexion over head 10 reps And D1 and D2 patterns in supine for shoulder  - 10 reps each  Keep pull under 1-2/10 and should feel in axilla and in shoulder          Patient will benefit from skilled therapeutic intervention in order to improve the following deficits and impairments: OF ROM and strength in bilateral UE - lymphedema risk low - will cont to assess- Dr Janese Banks will send OT to eval and tx order           Visit Diagnosis: Stiffness of left shoulder, not elsewhere classified  Stiffness of right shoulder, not elsewhere classified    Problem List Patient Active Problem List   Diagnosis Date Noted  . Breast wound 10/05/2019  . Acquired absence of bilateral breasts and nipples 09/04/2019  . Genetic testing 08/10/2019  . Monoallelic mutation of CHEK2 gene in female patient 08/07/2019  . Malignant neoplasm of upper-outer quadrant of right breast in female, estrogen receptor positive ([place]) 07/30/2019  . Goals of care, counseling/discussion 07/30/2019  . Family history of prostate cancer   . Family history of colon cancer   . Breast cancer ([place]) 07/25/2019  . Left lower quadrant pain 05/23/2018  . Personal history of kidney stones 04/20/2018  . Microscopic hematuria 04/20/2018  . 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 Overweight (BMI 25.0-29.9) 01/22/2017  . Encounter for preventive health examination 06/30/2016  . Renal colic 29/52/8413  . Ureteral calculus 04/12/2015  . Nephrolithiasis 03/04/2015  . Intractable migraine with aura without status migrainosus 10/29/2013  . Lack of libido 06/24/2013  . Migraine syndrome 03/11/2013  . Generalized anxiety disorder 03/11/2013    Rosalyn Gess OTR/L,CLT 10/31/2019,  9:15 AM  [facility] [address] [place], [place], [place], [address] Phone: [phone]   Fax:  [phone]  Name: Tiffany Acevedo MRN: [id number] Date of Birth: 1965/05/27

## 2019-11-04 NOTE — Progress Notes (Signed)
Patient is a 54 yr-old female here for follow-up after undergoing bilateral mastectomies with Dr. Windell Moment and immediate reconstruction with Dr. Marla Roe on 08/27/19. At 8/17 visit, she had some separation at bilateral incision lines. On 8/27 she underwent excision of bilateral breast wounds with primary closure.  ~ 10 weeks PO (~ 4 weeks PO wound excision) Patient reports she is doing very well.  No issues with her last fill.  She would like to be a little larger and so would like to have an additional fill today.  She is hoping to be a full C cup.  Denies fever/chills, nausea/vomiting.  She has been doing physical therapy and reports this has been helping.  Bilateral incisions are healing very nicely, C/D/I.  No signs of infection, redness, drainage, seroma/hematoma.  Patient has good laxity of the skin.  We placed injectable saline in the Expander using a sterile technique: Right: 60 cc for a total of 340 / 535 cc Left: 60 cc for a total of 340 / 535 cc  Continue to wear sports bra for 2 more weeks during the day.  May gradually begin resuming normal activities as tolerated.  Continue with PT as appropriate.  Follow-up in 2 weeks for additional fill.  Call office with any questions/concerns.  The [place] was signed into law in 2016 which includes the topic of electronic health records.  This provides immediate access to information in MyChart.  This includes consultation notes, operative notes, office notes, lab results and pathology reports.  If you have any questions about what you read please let us know at your next visit or call us at the office.  We are right here with you.

## 2019-11-06 ENCOUNTER — Ambulatory Visit: Payer: BC Managed Care – PPO | Admitting: Plastic Surgery

## 2019-11-07 ENCOUNTER — Other Ambulatory Visit: Payer: Self-pay

## 2019-11-07 ENCOUNTER — Ambulatory Visit (INDEPENDENT_AMBULATORY_CARE_PROVIDER_SITE_OTHER): Payer: BC Managed Care – PPO | Admitting: Plastic Surgery

## 2019-11-07 ENCOUNTER — Encounter: Payer: Self-pay | Admitting: Plastic Surgery

## 2019-11-07 VITALS — BP 94/59 | HR 78 | Temp 98.8°F

## 2019-11-07 DIAGNOSIS — Z9013 Acquired absence of bilateral breasts and nipples: Secondary | ICD-10-CM

## 2019-11-08 ENCOUNTER — Ambulatory Visit: Payer: BC Managed Care – PPO | Admitting: Occupational Therapy

## 2019-11-15 ENCOUNTER — Ambulatory Visit: Payer: BC Managed Care – PPO | Attending: Oncology | Admitting: Occupational Therapy

## 2019-11-15 ENCOUNTER — Other Ambulatory Visit: Payer: Self-pay

## 2019-11-15 ENCOUNTER — Encounter: Payer: Self-pay | Admitting: Occupational Therapy

## 2019-11-15 DIAGNOSIS — M6281 Muscle weakness (generalized): Secondary | ICD-10-CM | POA: Insufficient documentation

## 2019-11-15 DIAGNOSIS — M25611 Stiffness of right shoulder, not elsewhere classified: Secondary | ICD-10-CM | POA: Insufficient documentation

## 2019-11-15 DIAGNOSIS — M25612 Stiffness of left shoulder, not elsewhere classified: Secondary | ICD-10-CM | POA: Diagnosis present

## 2019-11-15 DIAGNOSIS — Z9013 Acquired absence of bilateral breasts and nipples: Secondary | ICD-10-CM

## 2019-11-15 NOTE — Therapy (Signed)
[facility] [address], [place], [address] Phone: [phone]   Fax:  [phone]  Occupational Therapy Evaluation  Patient Details  Name: Tiffany Acevedo MRN: [id number] Date of Birth: [birth year] Referring Provider (OT): Dr Janese Banks   Encounter Date: 11/15/2019   OT End of Session - 11/15/19 0959    Visit Number 1    Number of Visits 12    Date for OT Re-Evaluation 02/07/20    OT Start Time 0730    OT Stop Time 0812    OT Time Calculation (min) 42 min    Activity Tolerance Patient tolerated treatment well    Behavior During Therapy [facility] for tasks assessed/performed           Past Medical History:  Diagnosis Date  . Allergy   . Anxiety   . Depression   . Excessive weight gain 06/30/2016  . Family history of colon cancer   . Family history of colon cancer   . Family history of prostate cancer   . Frequent headaches   . GERD (gastroesophageal reflux disease)   . History of kidney stones   . Hypertension   . Migraines   . Pre-diabetes     Past Surgical History:  Procedure Laterality Date  . BREAST BIOPSY Right 07/13/2019   stereo biopsy/ x clip/path pending  . BREAST BIOPSY Right 07/13/2019   stereo biopsy/coil clip/ path pending  . BREAST RECONSTRUCTION WITH PLACEMENT OF TISSUE EXPANDER AND FLEX HD (ACELLULAR HYDRATED DERMIS) Bilateral 08/27/2019   Procedure: BILATERAL BREAST RECONSTRUCTION WITH PLACEMENT OF TISSUE EXPANDER AND FLEX HD (ACELLULAR HYDRATED DERMIS);  Surgeon: Wallace Going, DO;  Location: [facility];  Service: Plastics;  Laterality: Bilateral;  . [place], 2004, 2013  . COLONOSCOPY  2015  . CYSTOSCOPY W/ RETROGRADES Right 09/13/2018   Procedure: CYSTOSCOPY WITH RETROGRADE PYELOGRAM;  Surgeon: Abbie Sons, MD;  Location: [facility];  Service: Urology;  Laterality: Right;  . CYSTOSCOPY/URETEROSCOPY/HOLMIUM LASER/STENT PLACEMENT Left 05/09/2018    Procedure: CYSTOSCOPY/URETEROSCOPY/HOLMIUM LASER/STENT PLACEMENT;  Surgeon: Abbie Sons, MD;  Location: [facility];  Service: Urology;  Laterality: Left;  . CYSTOSCOPY/URETEROSCOPY/HOLMIUM LASER/STENT PLACEMENT Right 09/13/2018   Procedure: CYSTOSCOPY/URETEROSCOPY/HOLMIUM LASER/STENT PLACEMENT;  Surgeon: Abbie Sons, MD;  Location: [facility];  Service: Urology;  Laterality: Right;  . INCONTINENCE SURGERY     BLADDER TUCK  . PILONIDAL CYST EXCISION    . STONE EXTRACTION WITH BASKET Right 09/13/2018   Procedure: STONE EXTRACTION WITH BASKET;  Surgeon: Abbie Sons, MD;  Location: [facility];  Service: Urology;  Laterality: Right;  . TOTAL MASTECTOMY Bilateral 08/27/2019   Procedure: TOTAL MASTECTOMY;  Surgeon: Herbert Pun, MD;  Location: [facility];  Service: General;  Laterality: Bilateral;  START @930  DUE TO SENTINEL NODE  . UPPER GI ENDOSCOPY      There were no vitals filed for this visit.   Subjective Assessment - 11/15/19 0947    Subjective  I had again fill up for both expanders - schedule to get them taken out and implants 01/02/2020 - I am so busy at work and watching my daughter volleyball that I don't get as much time to do my exericses or walk - but one or 2 more weeks    Pertinent History Was diagnosis with R breast CA in June 21 , did not need radation -on tamoxifen and had 08/27/19 double mastectomy with expanders - had delay healing of incisions - refer to pain  and increase stiffness in bilateral shoulders - schedule for 01/02/20 for removal of expanders and implants put in    Patient Stated Goals Want to get my motion better in both shoulders - and pain so I can use my arms like before    Currently in Pain? Yes    Pain Score 5     Pain Location Chest    Pain Orientation Left    Pain Descriptors / Indicators Tightness    Pain Type Surgical pain    Pain Frequency Intermittent    Aggravating Factors  Abduction and external rotation             [facility] OT Assessment  - 11/15/19 0001      Assessment   Medical Diagnosis R breast CA with bil mastectomy and reconstruction     Referring Provider (OT) Dr Janese Banks    Onset Date/Surgical Date 08/27/19    Hand Dominance Right    Next MD Visit --   next week for another filling of expanders     [place] Family      Prior Function   Vocation Full time employment    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[employer], walk some, read, 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       AROM   Right Shoulder Extension 60 Degrees    Right Shoulder Flexion 145 Degrees    Right Shoulder ABduction 130 Degrees    Left Shoulder Extension 60 Degrees    Left Shoulder Flexion 145 Degrees    Left Shoulder ABduction 130 Degrees            LYMPHEDEMA/ONCOLOGY QUESTIONNAIRE - 11/15/19 0001      Right Upper Extremity Lymphedema   15 cm Proximal to Olecranon Process 31.5 cm    10 cm Proximal to Olecranon Process 29 cm      Left Upper Extremity Lymphedema   15 cm Proximal to Olecranon Process 31 cm    10 cm Proximal to Olecranon Process 29 cm            Pt was seen for 2 visits for screen in cancer center - and refer for evaluation  Dr Marla Roe office orders can  increase AROM in bilateral shoulders as tolerated   Incision close now since last seen   Pt show decrease AROM in bilateral shoulders mostly in  ABD and ext  L worse than R -and flexion Pt in supine - for gentle external rotations stretch  And then snow angel AROM - but pillow under L arm  Cont with  Doing at home combination nerve glide and scapula squeezes thru out the day few times - 5 reps   AT Home external rotation gentle stretch  In supine 1-2 min - gentle stretch less than 1-2/10  Shoulder ABD in supine 10 reps AAROM on wall for shoulder flexion 10 reps hold 3 sec  AAROM on wall for ABD 10 reps hold 3 sec - keep opposite shoulder back Supine focus  D1 and D2 patterns in supine for shoulder but into 150 range if possible  - 10 reps each  Keep pull under  1-2/10 and should feel in axilla  And lats , gentle pull on pect               OT Education - 11/15/19 0959    Education Details progress and HEP    Person(s) Educated Patient    Methods Explanation;Demonstration;Tactile cues    Comprehension Verbal cues required;Returned demonstration;Verbalized  understanding            OT Short Term Goals - 11/15/19 1004      OT SHORT TERM GOAL #1   Title Pt to be independent in HEP to increase AROM in bilateral shoulders to pull sweater out and reach into overhead cabinets    Baseline see flowsheet- need mod A and adjustments to HEP    Time 6    Period Weeks    Status New    Target Date 12/27/19             OT Long Term Goals - 11/15/19 1005      OT LONG TERM GOAL #1   Title Reassess pt AROM and change HEP to decrease scar tissue and increase AROM without increase pain    Baseline expanders removal schedule for 01/02/2020 , implants put in    Time 8    Period Weeks    Status New    Target Date 02/07/20      OT LONG TERM GOAL #2   Title Bilateral shoulder AROM increase to WNL for pt to return to prior leval of function    Baseline pt with decrease AROM for bilateral shoulders- L worse than R , schedule for another surgery in 6-7 wks    Time 12    Period Weeks    Status New    Target Date 01/24/20      OT LONG TERM GOAL #3   Title Pt bilateral shoulder strength increase to 5/5 for pt to carry , pick up and move object more than 8 lbs    Baseline no strengthening - mostly AROM at this time - had delay healing    Time 12    Period Weeks    Status New    Target Date 02/07/20                 Plan - 11/15/19 1000    Clinical Impression Statement Pt is 11 wks s/p double mastectomy with expanders - had delay healing of incisions that had to be closed again - delay her advancement in ROM for bilateral shoulders - pt show decrease shoulder AROM in all planes , with some discomfort mostly in external rotation and ABD  - with difficulty with over head reaching in ADL's and IADL's -pt schedule for expanders removal and implants 01/02/2020 - pt can benefit from cont OT services to increase AROM  , decrease stiffness and pain    OT Occupational Profile and History Problem Focused Assessment - Including review of records relating to presenting problem    Occupational performance deficits (Please refer to evaluation for details): ADL's;IADL's;Rest and Sleep;Work;Play;Leisure;Social Participation    Body Structure / Function / Physical Skills ADL;Decreased knowledge of precautions;Flexibility;IADL;Pain;ROM;UE functional use;Scar mobility;Strength    Rehab Potential Good    Clinical Decision Making Limited treatment options, no task modification necessary    Comorbidities Affecting Occupational Performance: None    Modification or Assistance to Complete Evaluation  No modification of tasks or assist necessary to complete eval    OT Frequency 1x / week    OT Duration 12 weeks    OT Treatment/Interventions Self-care/ADL training;Therapeutic exercise;Manual Therapy;Passive range of motion;Scar mobilization;Patient/family education    Consulted and Agree with Plan of Care Patient           Patient will benefit from skilled therapeutic intervention in order to improve the following deficits and impairments:   Body Structure / Function / Physical Skills: ADL,  Decreased knowledge of precautions, Flexibility, IADL, Pain, ROM, UE functional use, Scar mobility, Strength       Visit Diagnosis: Status post bilateral mastectomy - Plan: Ot plan of care cert/re-cert  Shoulder joint stiffness, bilateral - Plan: Ot plan of care cert/re-cert  Muscle weakness (generalized) - Plan: Ot plan of care cert/re-cert    Problem List Patient Active Problem List   Diagnosis Date Noted  . Breast wound 10/05/2019  . Acquired absence of bilateral breasts and nipples 09/04/2019  . Genetic testing 08/10/2019  . Monoallelic mutation  of CHEK2 gene in female patient 08/07/2019  . Malignant neoplasm of upper-outer quadrant of right breast in female, estrogen receptor positive ([place]) 07/30/2019  . Goals of care, counseling/discussion 07/30/2019  . Family history of prostate cancer   . Family history of colon cancer   . Breast cancer ([place]) 07/25/2019  . Left lower quadrant pain 05/23/2018  . Personal history of kidney stones 04/20/2018  . Microscopic hematuria 04/20/2018  . 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 Overweight (BMI 25.0-29.9) 01/22/2017  . Encounter for preventive health examination 06/30/2016  . Renal colic 46/50/3546  . Ureteral calculus 04/12/2015  . Nephrolithiasis 03/04/2015  . Intractable migraine with aura without status migrainosus 10/29/2013  . Lack of libido 06/24/2013  . Migraine syndrome 03/11/2013  . Generalized anxiety disorder 03/11/2013    Rosalyn Gess OTR/L,CLT  11/15/2019, 10:12 AM  [facility] [address] [place], [address] Phone: [phone]   Fax:  [phone]  Name: Tiffany Acevedo MRN: [id number] Date of Birth: 1965/07/31

## 2019-11-20 NOTE — Progress Notes (Signed)
Patient is a 54 yr-old female here for follow-up after undergoing bilateral mastectomies with Dr. Windell Moment and immediate placement of tissue expanders and flex HD with Dr. Marla Roe on 08/27/19. On 8/27 she underwent excision of bilateral breast wounds with primary closure.  ~ 12 weeks PO (~ 7 weeks PO wound excision) Tiffany Acevedo is doing very well today.  Bilateral incisions are healing well, C/D/I.  She reports she tolerated her last fill very well.  She would like to get an additional fill today.  Denies fever/chills, nausea/vomiting.  She has good skin laxity of bilateral breasts.  We placed injectable saline in the Expander using a sterile technique: Right: 60 cc for a total of 400 / 535 cc Left: 60 cc for a total of 400 / 535 cc  Follow-up next week for additional fill.  Call office with any questions/concerns.  Surgery to exchange tissue expanders for implants scheduled for 11/24.  The [place] was signed into law in 2016 which includes the topic of electronic health records.  This provides immediate access to information in MyChart.  This includes consultation notes, operative notes, office notes, lab results and pathology reports.  If you have any questions about what you read please let us know at your next visit or call us at the office.  We are right here with you.

## 2019-11-22 ENCOUNTER — Inpatient Hospital Stay: Payer: BC Managed Care – PPO | Attending: Oncology

## 2019-11-22 ENCOUNTER — Ambulatory Visit (INDEPENDENT_AMBULATORY_CARE_PROVIDER_SITE_OTHER): Payer: BC Managed Care – PPO | Admitting: Plastic Surgery

## 2019-11-22 ENCOUNTER — Other Ambulatory Visit: Payer: Self-pay

## 2019-11-22 ENCOUNTER — Encounter: Payer: Self-pay | Admitting: Plastic Surgery

## 2019-11-22 ENCOUNTER — Inpatient Hospital Stay (HOSPITAL_BASED_OUTPATIENT_CLINIC_OR_DEPARTMENT_OTHER): Payer: BC Managed Care – PPO | Admitting: Oncology

## 2019-11-22 ENCOUNTER — Encounter: Payer: Self-pay | Admitting: Oncology

## 2019-11-22 VITALS — BP 106/73 | HR 65 | Temp 98.4°F

## 2019-11-22 VITALS — BP 112/52 | HR 69 | Temp 98.1°F | Resp 16 | Wt 151.0 lb

## 2019-11-22 DIAGNOSIS — Z9013 Acquired absence of bilateral breasts and nipples: Secondary | ICD-10-CM

## 2019-11-22 DIAGNOSIS — Z5181 Encounter for therapeutic drug level monitoring: Secondary | ICD-10-CM | POA: Diagnosis not present

## 2019-11-22 DIAGNOSIS — C50911 Malignant neoplasm of unspecified site of right female breast: Secondary | ICD-10-CM | POA: Diagnosis present

## 2019-11-22 DIAGNOSIS — C50411 Malignant neoplasm of upper-outer quadrant of right female breast: Secondary | ICD-10-CM

## 2019-11-22 DIAGNOSIS — R519 Headache, unspecified: Secondary | ICD-10-CM | POA: Insufficient documentation

## 2019-11-22 DIAGNOSIS — Z170 Estrogen receptor positive status [ER+]: Secondary | ICD-10-CM | POA: Insufficient documentation

## 2019-11-22 DIAGNOSIS — Z79810 Long term (current) use of selective estrogen receptor modulators (SERMs): Secondary | ICD-10-CM

## 2019-11-22 NOTE — Progress Notes (Signed)
No breast concerns.

## 2019-11-23 NOTE — Progress Notes (Signed)
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 This was followed by diagnostic mammogram which showed numerous groups of calcifications spanning at least 6.4 cm. She had a biopsy of the medialmost and lateralmost calcification. The medialmost calcification showed invasive mammary carcinoma grade 1 ER/PR positive and HER-2 negative. The lateralmost calcification was negative for atypia and malignancy and showed fibrocystic changes. Patient has met with Dr. Peyton Najjar and is leaning towards at least a unilateral mastectomy. She also underwent bilateral MRI which did not show any enhancement even at the site of biopsy-proven malignancy or elsewhere. No MRI evidence of malignancy in the left breast.  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  3 sentinel lymph nodes negative for malignancy.  No evidence of malignancy or DCIS in the left breast.  Interval history-patient is working with occupational therapy to work on preventative exercises  for lymphedema following mastectomy.  Reports that since she started taking tamoxifen her blood pressures have been running low.  She has also noticed significant headaches which were under control prior to taking tamoxifen.  She does have a history of migraine headaches in the past  ECOG PS- 0 Pain scale- 0   Review of systems- Review of Systems  Constitutional: Negative for chills, fever, malaise/fatigue and weight loss.  HENT: Negative for congestion, ear discharge and nosebleeds.   Eyes: Negative for blurred vision.  Respiratory: Negative for cough, hemoptysis, sputum production, shortness of breath and wheezing.   Cardiovascular: Negative for chest pain, palpitations, orthopnea and claudication.  Gastrointestinal: Negative for abdominal pain, blood in stool, constipation, diarrhea, heartburn, melena, nausea and vomiting.  Genitourinary: Negative for dysuria, flank pain, frequency, hematuria and urgency.  Musculoskeletal: Negative for back pain, joint pain and myalgias.  Skin: Negative for rash.  Neurological: Positive for headaches. Negative for dizziness, tingling, focal weakness, seizures and weakness.  Endo/Heme/Allergies: Does not bruise/bleed easily.  Psychiatric/Behavioral: Negative for depression and suicidal ideas. The patient does not have insomnia.        Allergies  Allergen Reactions  . Percocet [Oxycodone-Acetaminophen] Other (See Comments)    Arms and legs go numb  . Sulfa Antibiotics Nausea And Vomiting     Past Medical History:  Diagnosis Date  . Allergy   . Anxiety   . Breast cancer ([place])   . Depression   . Excessive weight gain 06/30/2016  . Family history of colon cancer   . Family history of colon cancer   . Family history of prostate cancer   . Frequent headaches   . GERD (gastroesophageal reflux disease)   . History of kidney stones   . Hypertension   . Migraines   . Pre-diabetes  Past Surgical History:  Procedure Laterality Date  .  BREAST BIOPSY Right 07/13/2019   stereo biopsy/ x clip/path pending  . BREAST BIOPSY Right 07/13/2019   stereo biopsy/coil clip/ path pending  . BREAST RECONSTRUCTION WITH PLACEMENT OF TISSUE EXPANDER AND FLEX HD (ACELLULAR HYDRATED DERMIS) Bilateral 08/27/2019   Procedure: BILATERAL BREAST RECONSTRUCTION WITH PLACEMENT OF TISSUE EXPANDER AND FLEX HD (ACELLULAR HYDRATED DERMIS);  Surgeon: Wallace Going, DO;  Location: [facility];  Service: Plastics;  Laterality: Bilateral;  . [place], 2004, 2013  . COLONOSCOPY  2015  . CYSTOSCOPY W/ RETROGRADES Right 09/13/2018   Procedure: CYSTOSCOPY WITH RETROGRADE PYELOGRAM;  Surgeon: Abbie Sons, MD;  Location: [facility];  Service: Urology;  Laterality: Right;  . CYSTOSCOPY/URETEROSCOPY/HOLMIUM LASER/STENT PLACEMENT Left 05/09/2018   Procedure: CYSTOSCOPY/URETEROSCOPY/HOLMIUM LASER/STENT PLACEMENT;  Surgeon: Abbie Sons, MD;  Location: [facility];  Service: Urology;  Laterality: Left;  . CYSTOSCOPY/URETEROSCOPY/HOLMIUM LASER/STENT PLACEMENT Right 09/13/2018   Procedure: CYSTOSCOPY/URETEROSCOPY/HOLMIUM LASER/STENT PLACEMENT;  Surgeon: Abbie Sons, MD;  Location: [facility];  Service: Urology;  Laterality: Right;  . INCONTINENCE SURGERY     BLADDER TUCK  . PILONIDAL CYST EXCISION    . STONE EXTRACTION WITH BASKET Right 09/13/2018   Procedure: STONE EXTRACTION WITH BASKET;  Surgeon: Abbie Sons, MD;  Location: [facility];  Service: Urology;  Laterality: Right;  . TOTAL MASTECTOMY Bilateral 08/27/2019   Procedure: TOTAL MASTECTOMY;  Surgeon: Herbert Pun, MD;  Location: [facility];  Service: General;  Laterality: Bilateral;  START @930  DUE TO SENTINEL NODE  . UPPER GI ENDOSCOPY      Social History   Socioeconomic History  . Marital status: Married    Spouse name: Not on file  . Number of children: Not on file  . Years of education: Not on file  . Highest education level: Not on file  Occupational History  . Not on file    Tobacco Use  . Smoking status: Never Smoker  . Smokeless tobacco: Never Used  Vaping Use  . Vaping Use: Never used  Substance and Sexual Activity  . Alcohol use: Yes    Comment: glass of wine occassionally  . Drug use: No  . Sexual activity: Yes    Partners: Male  Other Topics Concern  . Not on file  Social History Narrative  . Not on file   Social Determinants of Health   Financial Resource Strain:   . Difficulty of Paying Living Expenses: Not on file  Food Insecurity:   . Worried About Charity fundraiser in the Last Year: Not on file  . Ran Out of Food in the Last Year: Not on file  Transportation Needs:   . Lack of Transportation (Medical): Not on file  . Lack of Transportation (Non-Medical): Not on file  Physical Activity:   . Days of Exercise per Week: Not on file  . Minutes of Exercise per Session: Not on file  Stress:   . Feeling of Stress : Not on file  Social Connections:   . Frequency of Communication with Friends and Family: Not on file  . Frequency of Social Gatherings with Friends and Family: Not on file  . Attends Religious Services: Not on file  . Active Member of Clubs or Organizations: Not on file  . Attends Archivist Meetings: Not on file  . Marital Status: Not on file  Intimate Partner Violence:   . Fear of Current or Ex-Partner: Not on file  . Emotionally Abused:  Not on file  . Physically Abused: Not on file  . Sexually Abused: Not on file    Family History  Problem Relation Age of Onset  . Hyperlipidemia Mother   . Hypertension Mother   . Depression Mother   . Colon cancer Mother 42  . Alcohol abuse Father   . Hyperlipidemia Father   . Stroke Father   . Hypertension Father   . Depression Father   . Heart disease Father   . Prostate cancer Father 46  . Arthritis Maternal Grandmother   . Hypertension Maternal Grandmother   . Depression Maternal Grandmother   . Arthritis Maternal Grandfather   . Stroke Maternal Grandfather    . Hypertension Maternal Grandfather   . Depression Maternal Grandfather   . Hypertension Paternal Grandmother   . Depression Paternal Grandmother   . Hypertension Paternal Grandfather   . Depression Paternal Grandfather   . Leukemia Maternal Aunt        d <50  . Cancer Paternal Aunt        unsure types     Current Outpatient Medications:  .  Calcium Carbonate-Vit D-Min (CALCIUM 1200 PO), Take 1 tablet by mouth daily. , Disp: , Rfl:  .  EMGALITY 120 MG/ML SOAJ, Inject 120 mg into the skin every 28 (twenty-eight) days., Disp: , Rfl:  .  hydrochlorothiazide (MICROZIDE) 12.5 MG capsule, Take 1 capsule (12.5 mg total) by mouth daily., Disp: 30 capsule, Rfl: 2 .  ibuprofen (ADVIL,MOTRIN) 200 MG tablet, Take 400 mg by mouth every 6 (six) hours as needed for headache or moderate pain. , Disp: , Rfl:  .  imipramine (TOFRANIL) 50 MG tablet, Take 50 mg by mouth at bedtime. , Disp: , Rfl:  .  metoprolol succinate (TOPROL-XL) 100 MG 24 hr tablet, Take 100 mg by mouth at bedtime. , Disp: , Rfl:  .  Multiple Vitamin (MULTIVITAMIN PO), Take 1 tablet by mouth daily., Disp: , Rfl:  .  omeprazole (PRILOSEC) 20 MG capsule, Take 20 mg by mouth daily. , Disp: , Rfl:  .  rizatriptan (MAXALT) 10 MG tablet, Take 10 mg by mouth as needed for migraine. , Disp: , Rfl:  .  tamoxifen (NOLVADEX) 20 MG tablet, Take 1 tablet (20 mg total) by mouth daily., Disp: 30 tablet, Rfl: 2 .  venlafaxine XR (EFFEXOR-XR) 150 MG 24 hr capsule, TAKE 1 CAPSULE BY MOUTH EVERY DAY WITH BREAKFAST (Patient taking differently: Take 150 mg by mouth daily with breakfast. ), Disp: 30 capsule, Rfl: 5 .  Zinc 22.5 MG TABS, Take 1 tablet by mouth daily., Disp: , Rfl:   Physical exam:  Vitals:   11/22/19 1455  BP: (!) 112/52  Pulse: 69  Resp: 16  Temp: 98.1 F (36.7 C)  TempSrc: Oral  Weight: 151 lb (68.5 kg)   Physical Exam Constitutional:      General: She is not in acute distress. Pulmonary:     Effort: Pulmonary effort is  normal.  Skin:    General: Skin is warm and dry.  Neurological:     Mental Status: She is alert and oriented to person, place, and time.   Patient is s/p bilateral mastectomy with immediate reconstruction.  No concerning signs of infection.  Mastectomy wound is healing well  CMP Latest Ref Rng & Units 09/14/2018  Glucose 70 - 99 mg/dL 124(H)  BUN 6 - 20 mg/dL 20  Creatinine 0.44 - 1.00 mg/dL 0.98  Sodium 135 - 145 mmol/L 141  Potassium 3.5 -  5.1 mmol/L 3.7  Chloride 98 - 111 mmol/L 106  CO2 22 - 32 mmol/L 25  Calcium 8.9 - 10.3 mg/dL 9.5  Total Protein 6.5 - 8.1 g/dL 7.6  Total Bilirubin 0.3 - 1.2 mg/dL 0.5  Alkaline Phos 38 - 126 U/L 75  AST 15 - 41 U/L 26  ALT 0 - 44 U/L 36   CBC Latest Ref Rng & Units 09/14/2018  WBC 4.0 - 10.5 K/uL 17.7(H)  Hemoglobin 12.0 - 15.0 g/dL 12.8  Hematocrit 36 - 46 % 38.9  Platelets 150 - 400 K/uL 257    Assessment and plan- Patient is a 54 y.o. female with CHEK2 mutation and new diagnosis of pathological prognostic stage Ia invasive mammary carcinoma of the right breast ER/PR positive and HER-2 negative pT1 apN0 cM0 s/p bilateral mastectomy.  She was started on tamoxifen and this is a routine follow-up visit  After starting tamoxifen patient noticed that her blood pressure has been running low and she also reports increasing frequency of headaches which were otherwise well controlled prior to starting tamoxifen.  She is going to cut down her metoprolol into half to see if her blood pressure gets better and if her headaches improve subsequently.  But if her headaches fail to improve she will come off tamoxifen for about 3 weeks to see how her symptoms are doing before deciding if she can continue taking tamoxifen or not.  Patient had a small 3 mm ER positive breast cancer and is s/p bilateral mastectomy and may not derive significant benefit from hormone therapy given her clinical scenario.  I will tentatively see her back in 3 months but she will call us  sooner if she has any questions or concerns    Visit Diagnosis 1. Encounter for monitoring tamoxifen therapy      Dr. Randa Evens, MD, MPH [facility] [phone] 11/23/2019 1:09 PM

## 2019-11-27 ENCOUNTER — Other Ambulatory Visit: Payer: Self-pay | Admitting: Oncology

## 2019-11-29 ENCOUNTER — Other Ambulatory Visit: Payer: Self-pay

## 2019-11-29 ENCOUNTER — Ambulatory Visit: Payer: BC Managed Care – PPO | Admitting: Occupational Therapy

## 2019-11-29 ENCOUNTER — Ambulatory Visit (INDEPENDENT_AMBULATORY_CARE_PROVIDER_SITE_OTHER): Payer: BC Managed Care – PPO | Admitting: Plastic Surgery

## 2019-11-29 ENCOUNTER — Encounter: Payer: Self-pay | Admitting: Plastic Surgery

## 2019-11-29 VITALS — BP 126/80 | HR 82 | Temp 97.8°F

## 2019-11-29 DIAGNOSIS — M6281 Muscle weakness (generalized): Secondary | ICD-10-CM

## 2019-11-29 DIAGNOSIS — Z9013 Acquired absence of bilateral breasts and nipples: Secondary | ICD-10-CM | POA: Diagnosis not present

## 2019-11-29 DIAGNOSIS — M25611 Stiffness of right shoulder, not elsewhere classified: Secondary | ICD-10-CM

## 2019-11-29 NOTE — Progress Notes (Signed)
Patient is a 54 year old female here for follow-up after undergoing bilateral mastectomies with Dr. Windell Moment and immediate placement of tissue expanders and Flex HD with Dr. Marla Roe on 08/27/2019.  On 8/27 she underwent excision of bilateral breast wounds with primary closure.  ~ 13 weeks PO Patient reports she is doing very well today.  Incisions are healing nicely, C/D/I.  No signs of infection, redness, drainage, seroma/hematoma.  She reports she is happy with the size.  We discussed doing a small fill today as once we switched to implants they visibly will look a little smaller.  We can still reevaluate size next week if needed.  She denies fever/chills, nausea/vomiting.  Reports she had no issues with her previous fill.  We placed injectable saline in the Expander using a sterile technique: Right: 30 cc for a total of 430 /535 cc Left: 30 cc for a total of 430 /535 cc  Surgery to exchange tissue expanders for implants scheduled for 11/24.

## 2019-11-29 NOTE — Therapy (Signed)
[facility] [address] [place], [address] Phone: [phone]   Fax:  [phone]  Occupational Therapy Treatment  Patient Details  Name: Tiffany Acevedo MRN: [id number] Date of Birth: 1965/07/29 Referring Provider (OT): Dr Janese Banks   Encounter Date: 11/29/2019   OT End of Session - 11/29/19 0759    Visit Number 2    Number of Visits 12    Date for OT Re-Evaluation 02/07/20    OT Start Time 0733    OT Stop Time 0758    OT Time Calculation (min) 25 min    Activity Tolerance Patient tolerated treatment well    Behavior During Therapy [facility] for tasks assessed/performed           Past Medical History:  Diagnosis Date  . Allergy   . Anxiety   . Breast cancer ([place])   . Depression   . Excessive weight gain 06/30/2016  . Family history of colon cancer   . Family history of colon cancer   . Family history of prostate cancer   . Frequent headaches   . GERD (gastroesophageal reflux disease)   . History of kidney stones   . Hypertension   . Migraines   . Pre-diabetes     Past Surgical History:  Procedure Laterality Date  . BREAST BIOPSY Right 07/13/2019   stereo biopsy/ x clip/path pending  . BREAST BIOPSY Right 07/13/2019   stereo biopsy/coil clip/ path pending  . BREAST RECONSTRUCTION WITH PLACEMENT OF TISSUE EXPANDER AND FLEX HD (ACELLULAR HYDRATED DERMIS) Bilateral 08/27/2019   Procedure: BILATERAL BREAST RECONSTRUCTION WITH PLACEMENT OF TISSUE EXPANDER AND FLEX HD (ACELLULAR HYDRATED DERMIS);  Surgeon: Wallace Going, DO;  Location: [facility];  Service: Plastics;  Laterality: Bilateral;  . [place], 2004, 2013  . COLONOSCOPY  2015  . CYSTOSCOPY W/ RETROGRADES Right 09/13/2018   Procedure: CYSTOSCOPY WITH RETROGRADE PYELOGRAM;  Surgeon: Abbie Sons, MD;  Location: [facility];  Service: Urology;  Laterality: Right;  . CYSTOSCOPY/URETEROSCOPY/HOLMIUM LASER/STENT PLACEMENT  Left 05/09/2018   Procedure: CYSTOSCOPY/URETEROSCOPY/HOLMIUM LASER/STENT PLACEMENT;  Surgeon: Abbie Sons, MD;  Location: [facility];  Service: Urology;  Laterality: Left;  . CYSTOSCOPY/URETEROSCOPY/HOLMIUM LASER/STENT PLACEMENT Right 09/13/2018   Procedure: CYSTOSCOPY/URETEROSCOPY/HOLMIUM LASER/STENT PLACEMENT;  Surgeon: Abbie Sons, MD;  Location: [facility];  Service: Urology;  Laterality: Right;  . INCONTINENCE SURGERY     BLADDER TUCK  . PILONIDAL CYST EXCISION    . STONE EXTRACTION WITH BASKET Right 09/13/2018   Procedure: STONE EXTRACTION WITH BASKET;  Surgeon: Abbie Sons, MD;  Location: [facility];  Service: Urology;  Laterality: Right;  . TOTAL MASTECTOMY Bilateral 08/27/2019   Procedure: TOTAL MASTECTOMY;  Surgeon: Herbert Pun, MD;  Location: [facility];  Service: General;  Laterality: Bilateral;  START @930  DUE TO SENTINEL NODE  . UPPER GI ENDOSCOPY      There were no vitals filed for this visit.   Subjective Assessment - 11/29/19 0758    Subjective  The last fill up made my tight - I could feel that one - but I did the stretches you showed me laying down - no on wall - doing okay - but I have another filling today and that will be the last - then reconstruction 11/24 scheduled for    Pertinent History Was diagnosis with R breast CA in June 21 , did not need radation -on tamoxifen and had 08/27/19 double mastectomy with expanders - had delay healing  of incisions - refer to pain and increase stiffness in bilateral shoulders - schedule for 01/02/20 for removal of expanders and implants put in    Patient Stated Goals Want to get my motion better in both shoulders - and pain so I can use my arms like before    Currently in Pain? No/denies                Dr Marla Roe office orders can  increase AROM in bilateral shoulders as tolerated   NO pain  - only tightness    Pt show increase AROM in bilateral shoulders flexion, ext and  ABD  Ext rotation WFL but tight  after last fill up - and getting another fill up today  Pt to cont to do in supine - gentle external rotations stretch and let gravity help And then snow angel AROM - but pillow under L arm still if needed  Cont with  Doing at home combination nerve glide and scapula squeezes thru out the day few times - 5 reps   Cont at home Home external rotation gentle stretch  In supine 1-2 min - gentle stretch less than 1-2/10  Shoulder ABD in supine 10 reps AAROM on wall for shoulder flexion 10 reps hold 3 sec  AAROM on wall for ABD 10 reps hold 3 sec - keep opposite shoulder back   Add this date RTB to HEP for scapula squeezes, and shoulder extention  12 reps - can do 2 sets if pain free And then D1/D2 - but only the ABD on from bottom to top -and from opposite shoulder out to hip  8-10 reps pain free  Hold if increase tightness later today after fill up - Work on ROM  And then start bands again                   OT Education - 11/29/19 0759    Education Details progress and HEP    Person(s) Educated Patient    Methods Explanation;Demonstration;Tactile cues    Comprehension Verbal cues required;Returned demonstration;Verbalized understanding            OT Short Term Goals - 11/15/19 1004      OT SHORT TERM GOAL #1   Title Pt to be independent in HEP to increase AROM in bilateral shoulders to pull sweater out and reach into overhead cabinets    Baseline see flowsheet- need mod A and adjustments to HEP    Time 6    Period Weeks    Status New    Target Date 12/27/19             OT Long Term Goals - 11/15/19 1005      OT LONG TERM GOAL #1   Title Reassess pt AROM and change HEP to decrease scar tissue and increase AROM without increase pain    Baseline expanders removal schedule for 01/02/2020 , implants put in    Time 8    Period Weeks    Status New    Target Date 02/07/20      OT LONG TERM GOAL #2   Title Bilateral shoulder AROM increase to WNL for pt to  return to prior leval of function    Baseline pt with decrease AROM for bilateral shoulders- L worse than R , schedule for another surgery in 6-7 wks    Time 12    Period Weeks    Status New    Target Date 01/24/20  OT LONG TERM GOAL #3   Title Pt bilateral shoulder strength increase to 5/5 for pt to carry , pick up and move object more than 8 lbs    Baseline no strengthening - mostly AROM at this time - had delay healing    Time 12    Period Weeks    Status New    Target Date 02/07/20                 Plan - 11/29/19 0800    Clinical Impression Statement Pt is 13 wks s/p double mastectomy with expanders -showed great progress in AROM - external rotation tightest but WFL -and bilateral shoulder flexion and ABD WNL - able to add some strenghthening this date without pain - pt has one more fill up today - to cont with stretches and AAROM - and strengthening - contact me if need to follouw up prior to next surgery - otherwise will see her after expanders removal and placement of silicon implants    OT Occupational Profile and History Problem Focused Assessment - Including review of records relating to presenting problem    Occupational performance deficits (Please refer to evaluation for details): ADL's;IADL's;Rest and Sleep;Work;Play;Leisure;Social Participation    Body Structure / Function / Physical Skills ADL;Decreased knowledge of precautions;Flexibility;IADL;Pain;ROM;UE functional use;Scar mobility;Strength    Rehab Potential Good    Clinical Decision Making Limited treatment options, no task modification necessary    Comorbidities Affecting Occupational Performance: None    Modification or Assistance to Complete Evaluation  No modification of tasks or assist necessary to complete eval    OT Frequency 1x / week    OT Duration 12 weeks    OT Treatment/Interventions Self-care/ADL training;Therapeutic exercise;Manual Therapy;Passive range of motion;Scar  mobilization;Patient/family education    Consulted and Agree with Plan of Care Patient           Patient will benefit from skilled therapeutic intervention in order to improve the following deficits and impairments:   Body Structure / Function / Physical Skills: ADL, Decreased knowledge of precautions, Flexibility, IADL, Pain, ROM, UE functional use, Scar mobility, Strength       Visit Diagnosis: Status post bilateral mastectomy  Shoulder joint stiffness, bilateral  Muscle weakness (generalized)    Problem List Patient Active Problem List   Diagnosis Date Noted  . Breast wound 10/05/2019  . Acquired absence of bilateral breasts and nipples 09/04/2019  . Genetic testing 08/10/2019  . Monoallelic mutation of CHEK2 gene in female patient 08/07/2019  . Malignant neoplasm of upper-outer quadrant of right breast in female, estrogen receptor positive ([place]) 07/30/2019  . Goals of care, counseling/discussion 07/30/2019  . Family history of prostate cancer   . Family history of colon cancer   . Breast cancer ([place]) 07/25/2019  . Left lower quadrant pain 05/23/2018  . Personal history of kidney stones 04/20/2018  . Microscopic hematuria 04/20/2018  . 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 Overweight (BMI 25.0-29.9) 01/22/2017  . Encounter for preventive health examination 06/30/2016  . Renal colic 16/11/9602  . Ureteral calculus 04/12/2015  . Nephrolithiasis 03/04/2015  . Intractable migraine with aura without status migrainosus 10/29/2013  . Lack of libido 06/24/2013  . Migraine syndrome 03/11/2013  . Generalized anxiety disorder 03/11/2013    Rosalyn Gess OTR/L,CLT 11/29/2019, 8:04 AM  [facility] [address], [place], [address] Phone: [phone]   Fax:  [phone]  Name: Nancie Bocanegra MRN:  [id number] Date of Birth: 05-18-1965

## 2019-12-02 DIAGNOSIS — S42309A Unspecified fracture of shaft of humerus, unspecified arm, initial encounter for closed fracture: Secondary | ICD-10-CM | POA: Insufficient documentation

## 2019-12-03 DIAGNOSIS — S42202A Unspecified fracture of upper end of left humerus, initial encounter for closed fracture: Secondary | ICD-10-CM | POA: Insufficient documentation

## 2019-12-04 ENCOUNTER — Telehealth: Payer: Self-pay

## 2019-12-04 NOTE — Telephone Encounter (Signed)
Spoke with patient this around lunch time. Reports she broke her humerus at the proximal end. It is not displaced and does not require surgery at this time.  Per ortho, will need to use caution in positioning arm for her upcoming surgery. Reports tissue expander appears the same size and she has not increased urination since fall on Sunday. Reports swelling near the shoulder above her breast.  Advised her to keep her appointments with [place] on Friday.

## 2019-12-04 NOTE — Telephone Encounter (Signed)
Patient called to let us know that she went to [place] and broke her left shoulder.  She is having some swelling above her expander.  She would really like to talk with [facility] when she has a minute.  Please call.

## 2019-12-04 NOTE — Telephone Encounter (Signed)
Returned pt's call regarding an injury to her left shoulder: Per pt- she tripped/fell on a sidewalk while out of town. She reports that she has a left shoulder/humerus fracture without displacement.   She is under the care of Dr. Mack Guise ([facility]) in [place], [place] for this injury.  Per pt- Dr. Mack Guise does not feel that the injury will require surgery at this time.  He has placed her in a sling & plans to f/u with her on 12/14/19 to obtain xray & reassess her status. I requested that she have Dr. Mack Guise forward his notes/recommendations after her next visit. Pt reports that she is in significant pain in her left shoulder &  left upper chest area with swelling/bruising, but no evident injury to her breasts. She denies any change in the size/volume of both breast expanders. She is using RX- Hydrocodone & OTC Ibuprofen for pain relief. She is out of work per Ortho- through 12/13/19 unless otherwise indicated. I consulted with Dr. Marla Roe about this information & she agrees with the plan at this time- pt will keep her f/u visit with Dr. Marla Roe on 12/07/19

## 2019-12-05 NOTE — Progress Notes (Signed)
ICD-10-CM   1. Acquired absence of bilateral breasts and nipples  Z90.13       Patient ID: Tiffany Acevedo, female    DOB: 11-19-1965, 54 y.o.   MRN: [id number]   History of Present Illness: Tiffany Acevedo is a 54 y.o.  female  with a history of bilateral mastectomies after diagnosis of right breast cancer and genetic testing.  She presents for preoperative evaluation for upcoming procedure, removal of bilateral tissue expanders and placement of bilateral implants, scheduled for 01/02/2020 with Dr. Marla Roe.  Summary from previous visit: Patient had bilateral mastectomies with Dr. Windell Moment and placement of tissue expanders and Flex HD with Dr. Marla Roe on 08/27/2019.  On 8/27 she will underwent excision of bilateral breast wounds with primary closure.  We have been filling slowly and she has done very well since.  She had a fall while visiting her daughter this weekend in [place].  She has a broken left humeral head that is being managed by Dr. Christia Reading in [place].  ** Take caution with postioning, putting binder on, etc. At time of surgery as patient has broken non-displaced left humeral head **  We has injectable saline in the Expander using a sterile technique: Right:  total of 430 / 535 cc Left:  total of 430 / 535 cc  Job: Teacher  PMH Significant for: HTN, Hx of migraines, anxiety, kidney stones, right side breast cancer  The patient has not had problems with anesthesia.   Past Medical History: Allergies: Allergies  Allergen Reactions  . Percocet [Oxycodone-Acetaminophen] Other (See Comments)    Arms and legs go numb  . Sulfa Antibiotics Nausea And Vomiting    Current Medications:  Current Outpatient Medications:  .  Calcium Carbonate-Vit D-Min (CALCIUM 1200 PO), Take 1 tablet by mouth daily. , Disp: , Rfl:  .  EMGALITY 120 MG/ML SOAJ, Inject 120 mg into the skin every 28 (twenty-eight) days., Disp: , Rfl:  .  hydrochlorothiazide  (MICROZIDE) 12.5 MG capsule, Take 1 capsule (12.5 mg total) by mouth daily., Disp: 30 capsule, Rfl: 2 .  HYDROcodone-acetaminophen (NORCO/VICODIN) 5-325 MG tablet, Take 1 tablet by mouth every 6 (six) hours as needed., Disp: , Rfl:  .  ibuprofen (ADVIL,MOTRIN) 200 MG tablet, Take 400 mg by mouth every 6 (six) hours as needed for headache or moderate pain. , Disp: , Rfl:  .  imipramine (TOFRANIL) 50 MG tablet, Take 50 mg by mouth at bedtime. , Disp: , Rfl:  .  metoprolol succinate (TOPROL-XL) 100 MG 24 hr tablet, Take 100 mg by mouth at bedtime. , Disp: , Rfl:  .  Multiple Vitamin (MULTIVITAMIN PO), Take 1 tablet by mouth daily., Disp: , Rfl:  .  omeprazole (PRILOSEC) 20 MG capsule, Take 20 mg by mouth daily. , Disp: , Rfl:  .  rizatriptan (MAXALT) 10 MG tablet, Take 10 mg by mouth as needed for migraine. , Disp: , Rfl:  .  tamoxifen (NOLVADEX) 20 MG tablet, TAKE 1 TABLET BY MOUTH DAILY., Disp: 30 tablet, Rfl: 2 .  venlafaxine XR (EFFEXOR-XR) 150 MG 24 hr capsule, TAKE 1 CAPSULE BY MOUTH EVERY DAY WITH BREAKFAST (Patient taking differently: Take 150 mg by mouth daily with breakfast. ), Disp: 30 capsule, Rfl: 5 .  Zinc 22.5 MG TABS, Take 1 tablet by mouth daily., Disp: , Rfl:   Past Medical Problems: Past Medical History:  Diagnosis Date  . Allergy   . Anxiety   . Breast cancer ([place])   .  Depression   . Excessive weight gain 06/30/2016  . Family history of colon cancer   . Family history of colon cancer   . Family history of prostate cancer   . Frequent headaches   . GERD (gastroesophageal reflux disease)   . History of kidney stones   . Hypertension   . Migraines   . Pre-diabetes     Past Surgical History: Past Surgical History:  Procedure Laterality Date  . BREAST BIOPSY Right 07/13/2019   stereo biopsy/ x clip/path pending  . BREAST BIOPSY Right 07/13/2019   stereo biopsy/coil clip/ path pending  . BREAST RECONSTRUCTION WITH PLACEMENT OF TISSUE EXPANDER AND FLEX HD (ACELLULAR  HYDRATED DERMIS) Bilateral 08/27/2019   Procedure: BILATERAL BREAST RECONSTRUCTION WITH PLACEMENT OF TISSUE EXPANDER AND FLEX HD (ACELLULAR HYDRATED DERMIS);  Surgeon: Wallace Going, DO;  Location: [facility];  Service: Plastics;  Laterality: Bilateral;  . [place], 2004, 2013  . COLONOSCOPY  2015  . CYSTOSCOPY W/ RETROGRADES Right 09/13/2018   Procedure: CYSTOSCOPY WITH RETROGRADE PYELOGRAM;  Surgeon: Abbie Sons, MD;  Location: [facility];  Service: Urology;  Laterality: Right;  . CYSTOSCOPY/URETEROSCOPY/HOLMIUM LASER/STENT PLACEMENT Left 05/09/2018   Procedure: CYSTOSCOPY/URETEROSCOPY/HOLMIUM LASER/STENT PLACEMENT;  Surgeon: Abbie Sons, MD;  Location: [facility];  Service: Urology;  Laterality: Left;  . CYSTOSCOPY/URETEROSCOPY/HOLMIUM LASER/STENT PLACEMENT Right 09/13/2018   Procedure: CYSTOSCOPY/URETEROSCOPY/HOLMIUM LASER/STENT PLACEMENT;  Surgeon: Abbie Sons, MD;  Location: [facility];  Service: Urology;  Laterality: Right;  . INCONTINENCE SURGERY     BLADDER TUCK  . PILONIDAL CYST EXCISION    . STONE EXTRACTION WITH BASKET Right 09/13/2018   Procedure: STONE EXTRACTION WITH BASKET;  Surgeon: Abbie Sons, MD;  Location: [facility];  Service: Urology;  Laterality: Right;  . TOTAL MASTECTOMY Bilateral 08/27/2019   Procedure: TOTAL MASTECTOMY;  Surgeon: Herbert Pun, MD;  Location: [facility];  Service: General;  Laterality: Bilateral;  START @930  DUE TO SENTINEL NODE  . UPPER GI ENDOSCOPY      Social History: Social History   Socioeconomic History  . Marital status: Married    Spouse name: Not on file  . Number of children: Not on file  . Years of education: Not on file  . Highest education level: Not on file  Occupational History  . Not on file  Tobacco Use  . Smoking status: Never Smoker  . Smokeless tobacco: Never Used  Vaping Use  . Vaping Use: Never used  Substance and Sexual Activity  . Alcohol use: Yes    Comment: glass of wine  occassionally  . Drug use: No  . Sexual activity: Yes    Partners: Male  Other Topics Concern  . Not on file  Social History Narrative  . Not on file   Social Determinants of Health   Financial Resource Strain:   . Difficulty of Paying Living Expenses: Not on file  Food Insecurity:   . Worried About Charity fundraiser in the Last Year: Not on file  . Ran Out of Food in the Last Year: Not on file  Transportation Needs:   . Lack of Transportation (Medical): Not on file  . Lack of Transportation (Non-Medical): Not on file  Physical Activity:   . Days of Exercise per Week: Not on file  . Minutes of Exercise per Session: Not on file  Stress:   . Feeling of Stress : Not on file  Social Connections:   . Frequency of Communication with Friends and Family: Not  on file  . Frequency of Social Gatherings with Friends and Family: Not on file  . Attends Religious Services: Not on file  . Active Member of Clubs or Organizations: Not on file  . Attends Archivist Meetings: Not on file  . Marital Status: Not on file  Intimate Partner Violence:   . Fear of Current or Ex-Partner: Not on file  . Emotionally Abused: Not on file  . Physically Abused: Not on file  . Sexually Abused: Not on file    Family History: Family History  Problem Relation Age of Onset  . Hyperlipidemia Mother   . Hypertension Mother   . Depression Mother   . Colon cancer Mother 45  . Alcohol abuse Father   . Hyperlipidemia Father   . Stroke Father   . Hypertension Father   . Depression Father   . Heart disease Father   . Prostate cancer Father 69  . Arthritis Maternal Grandmother   . Hypertension Maternal Grandmother   . Depression Maternal Grandmother   . Arthritis Maternal Grandfather   . Stroke Maternal Grandfather   . Hypertension Maternal Grandfather   . Depression Maternal Grandfather   . Hypertension Paternal Grandmother   . Depression Paternal Grandmother   . Hypertension Paternal  Grandfather   . Depression Paternal Grandfather   . Leukemia Maternal Aunt        d <50  . Cancer Paternal Aunt        unsure types    Review of Systems: Review of Systems  Constitutional: Negative for chills and fever.  HENT: Negative for congestion and sore throat.   Respiratory: Negative for cough and shortness of breath.   Cardiovascular: Negative for chest pain and palpitations.  Gastrointestinal: Negative for abdominal pain, nausea and vomiting.  Musculoskeletal: Negative for back pain, joint pain, myalgias and neck pain.       Fell on Sunday and has a broken left humeral head  Skin: Negative for itching and rash.    Physical Exam: Vital Signs There were no vitals taken for this visit. Physical Exam Nursing note reviewed.  Constitutional:      General: She is not in acute distress.    Appearance: Normal appearance. She is normal weight. She is not ill-appearing.  HENT:     Head: Normocephalic and atraumatic.  Eyes:     Extraocular Movements: Extraocular movements intact.  Cardiovascular:     Rate and Rhythm: Normal rate and regular rhythm.     Pulses: Normal pulses.     Heart sounds: Normal heart sounds.  Pulmonary:     Effort: Pulmonary effort is normal.     Breath sounds: Normal breath sounds. No wheezing, rhonchi or rales.  Abdominal:     General: Bowel sounds are normal.     Palpations: Abdomen is soft.  Musculoskeletal:        General: Signs of injury (Broken LEFT Humeral head) present. No swelling. Normal range of motion.     Cervical back: Normal range of motion.  Skin:    General: Skin is warm and dry.     Coloration: Skin is not pale.     Findings: No erythema or rash.  Neurological:     General: No focal deficit present.     Mental Status: She is alert and oriented to person, place, and time.  Psychiatric:        Mood and Affect: Mood normal.        Behavior: Behavior normal.  Thought Content: Thought content normal.        Judgment:  Judgment normal.     Assessment/Plan:  Ms. Brinker scheduled for removal of bilateral tissue expanders and placement of bilateral implants with Dr. Marla Roe.  Risks, benefits, and alternatives of procedure discussed, questions answered and consent obtained.    Smoking Status: Non-smoker; Counseling Given?  N/AA Last Mammogram: N/A-bilateral mastectomies  Caprini Score: 6 High; Risk Factors include: 54 year old female, Hx breast cancer, BMI > 25, and length of planned surgery. Recommendation for mechanical and pharmacological prophylaxis during surgery. Encourage early ambulation.   Pictures obtained: 07/23/19  Post-op Rx sent to pharmacy: Norco, Zofran, Keflex  Patient was provided with the General Surgical Risk consent document and Pain Medication Agreement prior to their appointment.  They had adequate time to read through the risk consent documents and Pain Medication Agreement. We also discussed them in person together during this preop appointment. All of their questions were answered to their satisfaction.  Recommended calling if they have any further questions.  Risk consent form and Pain Medication Agreement to be scanned into patient's chart.  Electronically signed by: Threasa Heads, PA-C 12/07/2019 10:13 AM

## 2019-12-05 NOTE — H&P (View-Only) (Signed)
ICD-10-CM   1. Acquired absence of bilateral breasts and nipples  Z90.13       Patient ID: Tiffany Acevedo, female    DOB: 07-Mar-1965, 54 y.o.   MRN: [id number]   History of Present Illness: Tiffany Acevedo is a 54 y.o.  female  with a history of bilateral mastectomies after diagnosis of right breast cancer and genetic testing.  She presents for preoperative evaluation for upcoming procedure, removal of bilateral tissue expanders and placement of bilateral implants, scheduled for 01/02/2020 with Dr. Marla Roe.  Summary from previous visit: Patient had bilateral mastectomies with Dr. Windell Moment and placement of tissue expanders and Flex HD with Dr. Marla Roe on 08/27/2019.  On 8/27 she will underwent excision of bilateral breast wounds with primary closure.  We have been filling slowly and she has done very well since.  She had a fall while visiting her daughter this weekend in [place].  She has a broken left humeral head that is being managed by Dr. Christia Reading in [place].  ** Take caution with postioning, putting binder on, etc. At time of surgery as patient has broken non-displaced left humeral head **  We has injectable saline in the Expander using a sterile technique: Right:  total of 430 / 535 cc Left:  total of 430 / 535 cc  Job: Teacher  PMH Significant for: HTN, Hx of migraines, anxiety, kidney stones, right side breast cancer  The patient has not had problems with anesthesia.   Past Medical History: Allergies: Allergies  Allergen Reactions  . Percocet [Oxycodone-Acetaminophen] Other (See Comments)    Arms and legs go numb  . Sulfa Antibiotics Nausea And Vomiting    Current Medications:  Current Outpatient Medications:  .  Calcium Carbonate-Vit D-Min (CALCIUM 1200 PO), Take 1 tablet by mouth daily. , Disp: , Rfl:  .  EMGALITY 120 MG/ML SOAJ, Inject 120 mg into the skin every 28 (twenty-eight) days., Disp: , Rfl:  .  hydrochlorothiazide  (MICROZIDE) 12.5 MG capsule, Take 1 capsule (12.5 mg total) by mouth daily., Disp: 30 capsule, Rfl: 2 .  HYDROcodone-acetaminophen (NORCO/VICODIN) 5-325 MG tablet, Take 1 tablet by mouth every 6 (six) hours as needed., Disp: , Rfl:  .  ibuprofen (ADVIL,MOTRIN) 200 MG tablet, Take 400 mg by mouth every 6 (six) hours as needed for headache or moderate pain. , Disp: , Rfl:  .  imipramine (TOFRANIL) 50 MG tablet, Take 50 mg by mouth at bedtime. , Disp: , Rfl:  .  metoprolol succinate (TOPROL-XL) 100 MG 24 hr tablet, Take 100 mg by mouth at bedtime. , Disp: , Rfl:  .  Multiple Vitamin (MULTIVITAMIN PO), Take 1 tablet by mouth daily., Disp: , Rfl:  .  omeprazole (PRILOSEC) 20 MG capsule, Take 20 mg by mouth daily. , Disp: , Rfl:  .  rizatriptan (MAXALT) 10 MG tablet, Take 10 mg by mouth as needed for migraine. , Disp: , Rfl:  .  tamoxifen (NOLVADEX) 20 MG tablet, TAKE 1 TABLET BY MOUTH DAILY., Disp: 30 tablet, Rfl: 2 .  venlafaxine XR (EFFEXOR-XR) 150 MG 24 hr capsule, TAKE 1 CAPSULE BY MOUTH EVERY DAY WITH BREAKFAST (Patient taking differently: Take 150 mg by mouth daily with breakfast. ), Disp: 30 capsule, Rfl: 5 .  Zinc 22.5 MG TABS, Take 1 tablet by mouth daily., Disp: , Rfl:   Past Medical Problems: Past Medical History:  Diagnosis Date  . Allergy   . Anxiety   . Breast cancer ([place])   .  Depression   . Excessive weight gain 06/30/2016  . Family history of colon cancer   . Family history of colon cancer   . Family history of prostate cancer   . Frequent headaches   . GERD (gastroesophageal reflux disease)   . History of kidney stones   . Hypertension   . Migraines   . Pre-diabetes     Past Surgical History: Past Surgical History:  Procedure Laterality Date  . BREAST BIOPSY Right 07/13/2019   stereo biopsy/ x clip/path pending  . BREAST BIOPSY Right 07/13/2019   stereo biopsy/coil clip/ path pending  . BREAST RECONSTRUCTION WITH PLACEMENT OF TISSUE EXPANDER AND FLEX HD (ACELLULAR  HYDRATED DERMIS) Bilateral 08/27/2019   Procedure: BILATERAL BREAST RECONSTRUCTION WITH PLACEMENT OF TISSUE EXPANDER AND FLEX HD (ACELLULAR HYDRATED DERMIS);  Surgeon: Wallace Going, DO;  Location: [facility];  Service: Plastics;  Laterality: Bilateral;  . [place], 2004, 2013  . COLONOSCOPY  2015  . CYSTOSCOPY W/ RETROGRADES Right 09/13/2018   Procedure: CYSTOSCOPY WITH RETROGRADE PYELOGRAM;  Surgeon: Abbie Sons, MD;  Location: [facility];  Service: Urology;  Laterality: Right;  . CYSTOSCOPY/URETEROSCOPY/HOLMIUM LASER/STENT PLACEMENT Left 05/09/2018   Procedure: CYSTOSCOPY/URETEROSCOPY/HOLMIUM LASER/STENT PLACEMENT;  Surgeon: Abbie Sons, MD;  Location: [facility];  Service: Urology;  Laterality: Left;  . CYSTOSCOPY/URETEROSCOPY/HOLMIUM LASER/STENT PLACEMENT Right 09/13/2018   Procedure: CYSTOSCOPY/URETEROSCOPY/HOLMIUM LASER/STENT PLACEMENT;  Surgeon: Abbie Sons, MD;  Location: [facility];  Service: Urology;  Laterality: Right;  . INCONTINENCE SURGERY     BLADDER TUCK  . PILONIDAL CYST EXCISION    . STONE EXTRACTION WITH BASKET Right 09/13/2018   Procedure: STONE EXTRACTION WITH BASKET;  Surgeon: Abbie Sons, MD;  Location: [facility];  Service: Urology;  Laterality: Right;  . TOTAL MASTECTOMY Bilateral 08/27/2019   Procedure: TOTAL MASTECTOMY;  Surgeon: Herbert Pun, MD;  Location: [facility];  Service: General;  Laterality: Bilateral;  START @930  DUE TO SENTINEL NODE  . UPPER GI ENDOSCOPY      Social History: Social History   Socioeconomic History  . Marital status: Married    Spouse name: Not on file  . Number of children: Not on file  . Years of education: Not on file  . Highest education level: Not on file  Occupational History  . Not on file  Tobacco Use  . Smoking status: Never Smoker  . Smokeless tobacco: Never Used  Vaping Use  . Vaping Use: Never used  Substance and Sexual Activity  . Alcohol use: Yes    Comment: glass of wine  occassionally  . Drug use: No  . Sexual activity: Yes    Partners: Male  Other Topics Concern  . Not on file  Social History Narrative  . Not on file   Social Determinants of Health   Financial Resource Strain:   . Difficulty of Paying Living Expenses: Not on file  Food Insecurity:   . Worried About Charity fundraiser in the Last Year: Not on file  . Ran Out of Food in the Last Year: Not on file  Transportation Needs:   . Lack of Transportation (Medical): Not on file  . Lack of Transportation (Non-Medical): Not on file  Physical Activity:   . Days of Exercise per Week: Not on file  . Minutes of Exercise per Session: Not on file  Stress:   . Feeling of Stress : Not on file  Social Connections:   . Frequency of Communication with Friends and Family: Not  on file  . Frequency of Social Gatherings with Friends and Family: Not on file  . Attends Religious Services: Not on file  . Active Member of Clubs or Organizations: Not on file  . Attends Archivist Meetings: Not on file  . Marital Status: Not on file  Intimate Partner Violence:   . Fear of Current or Ex-Partner: Not on file  . Emotionally Abused: Not on file  . Physically Abused: Not on file  . Sexually Abused: Not on file    Family History: Family History  Problem Relation Age of Onset  . Hyperlipidemia Mother   . Hypertension Mother   . Depression Mother   . Colon cancer Mother 61  . Alcohol abuse Father   . Hyperlipidemia Father   . Stroke Father   . Hypertension Father   . Depression Father   . Heart disease Father   . Prostate cancer Father 81  . Arthritis Maternal Grandmother   . Hypertension Maternal Grandmother   . Depression Maternal Grandmother   . Arthritis Maternal Grandfather   . Stroke Maternal Grandfather   . Hypertension Maternal Grandfather   . Depression Maternal Grandfather   . Hypertension Paternal Grandmother   . Depression Paternal Grandmother   . Hypertension Paternal  Grandfather   . Depression Paternal Grandfather   . Leukemia Maternal Aunt        d <50  . Cancer Paternal Aunt        unsure types    Review of Systems: Review of Systems  Constitutional: Negative for chills and fever.  HENT: Negative for congestion and sore throat.   Respiratory: Negative for cough and shortness of breath.   Cardiovascular: Negative for chest pain and palpitations.  Gastrointestinal: Negative for abdominal pain, nausea and vomiting.  Musculoskeletal: Negative for back pain, joint pain, myalgias and neck pain.       Fell on Sunday and has a broken left humeral head  Skin: Negative for itching and rash.    Physical Exam: Vital Signs There were no vitals taken for this visit. Physical Exam Nursing note reviewed.  Constitutional:      General: She is not in acute distress.    Appearance: Normal appearance. She is normal weight. She is not ill-appearing.  HENT:     Head: Normocephalic and atraumatic.  Eyes:     Extraocular Movements: Extraocular movements intact.  Cardiovascular:     Rate and Rhythm: Normal rate and regular rhythm.     Pulses: Normal pulses.     Heart sounds: Normal heart sounds.  Pulmonary:     Effort: Pulmonary effort is normal.     Breath sounds: Normal breath sounds. No wheezing, rhonchi or rales.  Abdominal:     General: Bowel sounds are normal.     Palpations: Abdomen is soft.  Musculoskeletal:        General: Signs of injury (Broken LEFT Humeral head) present. No swelling. Normal range of motion.     Cervical back: Normal range of motion.  Skin:    General: Skin is warm and dry.     Coloration: Skin is not pale.     Findings: No erythema or rash.  Neurological:     General: No focal deficit present.     Mental Status: She is alert and oriented to person, place, and time.  Psychiatric:        Mood and Affect: Mood normal.        Behavior: Behavior normal.  Thought Content: Thought content normal.        Judgment:  Judgment normal.     Assessment/Plan:  Ms. Meinhardt scheduled for removal of bilateral tissue expanders and placement of bilateral implants with Dr. Marla Roe.  Risks, benefits, and alternatives of procedure discussed, questions answered and consent obtained.    Smoking Status: Non-smoker; Counseling Given?  N/AA Last Mammogram: N/A-bilateral mastectomies  Caprini Score: 6 High; Risk Factors include: 54 year old female, Hx breast cancer, BMI > 25, and length of planned surgery. Recommendation for mechanical and pharmacological prophylaxis during surgery. Encourage early ambulation.   Pictures obtained: 07/23/19  Post-op Rx sent to pharmacy: Norco, Zofran, Keflex  Patient was provided with the General Surgical Risk consent document and Pain Medication Agreement prior to their appointment.  They had adequate time to read through the risk consent documents and Pain Medication Agreement. We also discussed them in person together during this preop appointment. All of their questions were answered to their satisfaction.  Recommended calling if they have any further questions.  Risk consent form and Pain Medication Agreement to be scanned into patient's chart.  Electronically signed by: Threasa Heads, PA-C 12/07/2019 10:13 AM

## 2019-12-07 ENCOUNTER — Encounter: Payer: Self-pay | Admitting: Plastic Surgery

## 2019-12-07 ENCOUNTER — Other Ambulatory Visit: Payer: Self-pay

## 2019-12-07 ENCOUNTER — Ambulatory Visit (INDEPENDENT_AMBULATORY_CARE_PROVIDER_SITE_OTHER): Payer: BC Managed Care – PPO | Admitting: Plastic Surgery

## 2019-12-07 VITALS — HR 78 | Temp 97.7°F | Ht 64.0 in | Wt 152.0 lb

## 2019-12-07 DIAGNOSIS — Z9013 Acquired absence of bilateral breasts and nipples: Secondary | ICD-10-CM

## 2019-12-07 MED ORDER — ONDANSETRON HCL 4 MG PO TABS: 4.0000 mg | ORAL_TABLET | Freq: Three times a day (TID) | ORAL | 0 refills | Status: DC | PRN

## 2019-12-07 MED ORDER — HYDROCODONE-ACETAMINOPHEN 5-325 MG PO TABS: 1.0000 | ORAL_TABLET | Freq: Three times a day (TID) | ORAL | 0 refills | Status: AC | PRN

## 2019-12-07 MED ORDER — CEPHALEXIN 500 MG PO CAPS
500.0000 mg | ORAL_CAPSULE | Freq: Four times a day (QID) | ORAL | 0 refills | Status: AC
Start: 2019-12-07 — End: 2019-12-10

## 2019-12-07 NOTE — Progress Notes (Signed)
The patient is a 54 year old female here with her health and for follow-up on bilateral breast reconstruction.  She had mastectomies we have gone slow with her expansion.  She had some skin breakdown.  We were able to fix this in the office and she is doing really well.  We are not going to expand anymore we will need to be very careful at the time of surgery which is next month.  She had a fall while visiting her daughter in [place].  She has a broken humeral head.  This is being managed by Dr. Mack Guise in [place].  She is getting her history and physical by [place] today.

## 2019-12-11 ENCOUNTER — Encounter: Payer: Self-pay | Admitting: Surgical

## 2019-12-11 NOTE — Progress Notes (Signed)
Surgical Clearance has been received from Dr. Janese Banks for patient's upcoming surgery with Dr. Marla Roe. Medications to hold prior to surgery tamoxifen (Stop taking: 12/10/2019 begin retaking 2 weeks after surgery)   We will have nursing staff call patient.

## 2019-12-25 ENCOUNTER — Other Ambulatory Visit: Payer: Self-pay

## 2019-12-25 ENCOUNTER — Encounter (HOSPITAL_BASED_OUTPATIENT_CLINIC_OR_DEPARTMENT_OTHER): Payer: Self-pay | Admitting: Plastic Surgery

## 2019-12-28 ENCOUNTER — Other Ambulatory Visit: Payer: Self-pay

## 2019-12-28 ENCOUNTER — Encounter: Payer: Self-pay | Admitting: Plastic Surgery

## 2019-12-28 ENCOUNTER — Ambulatory Visit (INDEPENDENT_AMBULATORY_CARE_PROVIDER_SITE_OTHER): Payer: BC Managed Care – PPO | Admitting: Plastic Surgery

## 2019-12-28 DIAGNOSIS — L723 Sebaceous cyst: Secondary | ICD-10-CM | POA: Diagnosis not present

## 2019-12-31 ENCOUNTER — Inpatient Hospital Stay: Admission: RE | Admit: 2019-12-31 | Payer: BC Managed Care – PPO | Source: Ambulatory Visit

## 2019-12-31 ENCOUNTER — Encounter: Payer: Self-pay | Admitting: Plastic Surgery

## 2019-12-31 ENCOUNTER — Encounter (HOSPITAL_BASED_OUTPATIENT_CLINIC_OR_DEPARTMENT_OTHER)
Admission: RE | Admit: 2019-12-31 | Discharge: 2019-12-31 | Disposition: A | Discharge status: Home / Self Care | Payer: BC Managed Care – PPO | Source: Ambulatory Visit | Attending: Plastic Surgery | Admitting: Plastic Surgery

## 2019-12-31 ENCOUNTER — Other Ambulatory Visit (HOSPITAL_COMMUNITY)
Admission: RE | Admit: 2019-12-31 | Discharge: 2019-12-31 | Disposition: A | Discharge status: Home / Self Care | Payer: BC Managed Care – PPO | Source: Ambulatory Visit | Attending: Plastic Surgery | Admitting: Plastic Surgery

## 2019-12-31 DIAGNOSIS — Z9013 Acquired absence of bilateral breasts and nipples: Secondary | ICD-10-CM | POA: Diagnosis not present

## 2019-12-31 DIAGNOSIS — I10 Essential (primary) hypertension: Secondary | ICD-10-CM | POA: Diagnosis not present

## 2019-12-31 DIAGNOSIS — Z823 Family history of stroke: Secondary | ICD-10-CM | POA: Diagnosis not present

## 2019-12-31 DIAGNOSIS — Z8249 Family history of ischemic heart disease and other diseases of the circulatory system: Secondary | ICD-10-CM | POA: Diagnosis not present

## 2019-12-31 DIAGNOSIS — Z811 Family history of alcohol abuse and dependence: Secondary | ICD-10-CM | POA: Diagnosis not present

## 2019-12-31 DIAGNOSIS — Z806 Family history of leukemia: Secondary | ICD-10-CM | POA: Diagnosis not present

## 2019-12-31 DIAGNOSIS — K219 Gastro-esophageal reflux disease without esophagitis: Secondary | ICD-10-CM | POA: Diagnosis not present

## 2019-12-31 DIAGNOSIS — Z01812 Encounter for preprocedural laboratory examination: Secondary | ICD-10-CM | POA: Insufficient documentation

## 2019-12-31 DIAGNOSIS — Z809 Family history of malignant neoplasm, unspecified: Secondary | ICD-10-CM | POA: Diagnosis not present

## 2019-12-31 DIAGNOSIS — Z45811 Encounter for adjustment or removal of right breast implant: Secondary | ICD-10-CM | POA: Diagnosis not present

## 2019-12-31 DIAGNOSIS — Z79899 Other long term (current) drug therapy: Secondary | ICD-10-CM | POA: Diagnosis not present

## 2019-12-31 DIAGNOSIS — Z8042 Family history of malignant neoplasm of prostate: Secondary | ICD-10-CM | POA: Diagnosis not present

## 2019-12-31 DIAGNOSIS — Z45819 Encounter for adjustment or removal of unspecified breast implant: Secondary | ICD-10-CM | POA: Diagnosis present

## 2019-12-31 DIAGNOSIS — G43909 Migraine, unspecified, not intractable, without status migrainosus: Secondary | ICD-10-CM | POA: Diagnosis not present

## 2019-12-31 DIAGNOSIS — Z87442 Personal history of urinary calculi: Secondary | ICD-10-CM | POA: Diagnosis not present

## 2019-12-31 DIAGNOSIS — Z800 Family history of malignant neoplasm of digestive organs: Secondary | ICD-10-CM | POA: Diagnosis not present

## 2019-12-31 DIAGNOSIS — R7303 Prediabetes: Secondary | ICD-10-CM | POA: Diagnosis not present

## 2019-12-31 DIAGNOSIS — Z882 Allergy status to sulfonamides status: Secondary | ICD-10-CM | POA: Diagnosis not present

## 2019-12-31 DIAGNOSIS — L723 Sebaceous cyst: Secondary | ICD-10-CM | POA: Insufficient documentation

## 2019-12-31 DIAGNOSIS — Z45812 Encounter for adjustment or removal of left breast implant: Secondary | ICD-10-CM | POA: Diagnosis not present

## 2019-12-31 DIAGNOSIS — Z885 Allergy status to narcotic agent status: Secondary | ICD-10-CM | POA: Diagnosis not present

## 2019-12-31 DIAGNOSIS — Z818 Family history of other mental and behavioral disorders: Secondary | ICD-10-CM | POA: Diagnosis not present

## 2019-12-31 DIAGNOSIS — Z8261 Family history of arthritis: Secondary | ICD-10-CM | POA: Diagnosis not present

## 2019-12-31 DIAGNOSIS — Z853 Personal history of malignant neoplasm of breast: Secondary | ICD-10-CM | POA: Diagnosis not present

## 2019-12-31 DIAGNOSIS — Z20822 Contact with and (suspected) exposure to covid-19: Secondary | ICD-10-CM | POA: Insufficient documentation

## 2019-12-31 LAB — BASIC METABOLIC PANEL
Anion gap: 9 (ref 5–15)
BUN: 20 mg/dL (ref 6–20)
CO2: 24 mmol/L (ref 22–32)
Calcium: 9.1 mg/dL (ref 8.9–10.3)
Chloride: 101 mmol/L (ref 98–111)
Creatinine, Ser: 0.81 mg/dL (ref 0.44–1.00)
GFR, Estimated: >60 mL/min (ref >60)
Glucose, Bld: 85 mg/dL (ref 70–99)
Potassium: 3.9 mmol/L (ref 3.5–5.1)
Sodium: 134 mmol/L — ABNORMAL LOW (ref 135–145)

## 2019-12-31 LAB — SARS CORONAVIRUS 2 (TAT 6-24 HRS): SARS Coronavirus 2: NEGATIVE

## 2019-12-31 LAB — POCT PREGNANCY, URINE: Preg Test, Ur: NEGATIVE

## 2019-12-31 NOTE — Progress Notes (Signed)
Procedure Note  Preoperative Dx: sebaceous cyst back  Postoperative Dx: Same  Procedure: Excision of sebaceous cyst back 1.5 cm  Anesthesia: Lidocaine 1% with 1:100,000 epinepherine  Indication for Procedure: cyst  Description of Procedure: Risks and complications were explained to the patient.  Consent was confirmed and the patient understands the risks and benefits.  The potential complications and alternatives were explained and the patient consents.  The patient expressed understanding the option of not having the procedure and the risks of a scar.  Time out was called and all information was confirmed to be correct.    The area was prepped and drapped.  Lidocaine 1% with epinepherine was injected in the subcutaneous area.  After waiting several minutes for the local to take affect a #15 blade was used to incise the skin over the lesion.  The tissue scissors were used to release the capsule from the surrounding tissue and remove it completely. The skin edges were reapproximated with 5-0 Monocryl subcuticular running closure.  A dressing was applied.  The patient was given instructions on how to care for the area and a follow up appointment.  Tiffany Acevedo tolerated the procedure well and there were no complications. The patient agreed to not send cyst to path as it appeared normal cystic in nature.

## 2020-01-02 ENCOUNTER — Ambulatory Visit (HOSPITAL_BASED_OUTPATIENT_CLINIC_OR_DEPARTMENT_OTHER): Payer: BC Managed Care – PPO | Admitting: Anesthesiology

## 2020-01-02 ENCOUNTER — Encounter (HOSPITAL_BASED_OUTPATIENT_CLINIC_OR_DEPARTMENT_OTHER)
Admission: RE | Disposition: A | Discharge status: Home / Self Care | Payer: Self-pay | Source: Home / Self Care | Attending: Plastic Surgery

## 2020-01-02 ENCOUNTER — Ambulatory Visit (HOSPITAL_BASED_OUTPATIENT_CLINIC_OR_DEPARTMENT_OTHER)
Admission: RE | Admit: 2020-01-02 | Discharge: 2020-01-02 | Disposition: A | Discharge status: Home / Self Care | Payer: BC Managed Care – PPO | Attending: Plastic Surgery | Admitting: Plastic Surgery

## 2020-01-02 ENCOUNTER — Other Ambulatory Visit: Payer: Self-pay

## 2020-01-02 ENCOUNTER — Encounter (HOSPITAL_BASED_OUTPATIENT_CLINIC_OR_DEPARTMENT_OTHER): Payer: Self-pay | Admitting: Plastic Surgery

## 2020-01-02 DIAGNOSIS — I10 Essential (primary) hypertension: Secondary | ICD-10-CM | POA: Insufficient documentation

## 2020-01-02 DIAGNOSIS — Z20822 Contact with and (suspected) exposure to covid-19: Secondary | ICD-10-CM | POA: Insufficient documentation

## 2020-01-02 DIAGNOSIS — Z421 Encounter for breast reconstruction following mastectomy: Secondary | ICD-10-CM

## 2020-01-02 DIAGNOSIS — Z8042 Family history of malignant neoplasm of prostate: Secondary | ICD-10-CM | POA: Insufficient documentation

## 2020-01-02 DIAGNOSIS — Z823 Family history of stroke: Secondary | ICD-10-CM | POA: Insufficient documentation

## 2020-01-02 DIAGNOSIS — Z8261 Family history of arthritis: Secondary | ICD-10-CM | POA: Insufficient documentation

## 2020-01-02 DIAGNOSIS — Z8249 Family history of ischemic heart disease and other diseases of the circulatory system: Secondary | ICD-10-CM | POA: Insufficient documentation

## 2020-01-02 DIAGNOSIS — Z79899 Other long term (current) drug therapy: Secondary | ICD-10-CM | POA: Insufficient documentation

## 2020-01-02 DIAGNOSIS — Z853 Personal history of malignant neoplasm of breast: Secondary | ICD-10-CM | POA: Insufficient documentation

## 2020-01-02 DIAGNOSIS — Z45812 Encounter for adjustment or removal of left breast implant: Secondary | ICD-10-CM | POA: Diagnosis not present

## 2020-01-02 DIAGNOSIS — Z809 Family history of malignant neoplasm, unspecified: Secondary | ICD-10-CM | POA: Insufficient documentation

## 2020-01-02 DIAGNOSIS — Z9013 Acquired absence of bilateral breasts and nipples: Secondary | ICD-10-CM

## 2020-01-02 DIAGNOSIS — R7303 Prediabetes: Secondary | ICD-10-CM | POA: Insufficient documentation

## 2020-01-02 DIAGNOSIS — Z885 Allergy status to narcotic agent status: Secondary | ICD-10-CM | POA: Insufficient documentation

## 2020-01-02 DIAGNOSIS — Z818 Family history of other mental and behavioral disorders: Secondary | ICD-10-CM | POA: Insufficient documentation

## 2020-01-02 DIAGNOSIS — Z806 Family history of leukemia: Secondary | ICD-10-CM | POA: Insufficient documentation

## 2020-01-02 DIAGNOSIS — Z811 Family history of alcohol abuse and dependence: Secondary | ICD-10-CM | POA: Insufficient documentation

## 2020-01-02 DIAGNOSIS — Z882 Allergy status to sulfonamides status: Secondary | ICD-10-CM | POA: Insufficient documentation

## 2020-01-02 DIAGNOSIS — Z45811 Encounter for adjustment or removal of right breast implant: Secondary | ICD-10-CM | POA: Insufficient documentation

## 2020-01-02 DIAGNOSIS — K219 Gastro-esophageal reflux disease without esophagitis: Secondary | ICD-10-CM | POA: Insufficient documentation

## 2020-01-02 DIAGNOSIS — Z87442 Personal history of urinary calculi: Secondary | ICD-10-CM | POA: Insufficient documentation

## 2020-01-02 DIAGNOSIS — Z800 Family history of malignant neoplasm of digestive organs: Secondary | ICD-10-CM | POA: Insufficient documentation

## 2020-01-02 DIAGNOSIS — G43909 Migraine, unspecified, not intractable, without status migrainosus: Secondary | ICD-10-CM | POA: Insufficient documentation

## 2020-01-02 HISTORY — PX: REMOVAL OF BILATERAL TISSUE EXPANDERS WITH PLACEMENT OF BILATERAL BREAST IMPLANTS: SHX6431

## 2020-01-02 SURGERY — REMOVAL, TISSUE EXPANDER, BREAST, BILATERAL, WITH BILATERAL IMPLANT IMPLANT INSERTION
Anesthesia: General | Site: Breast | Laterality: Bilateral

## 2020-01-02 MED ORDER — FENTANYL CITRATE (PF) 100 MCG/2ML IJ SOLN
INTRAMUSCULAR | Status: DC | PRN
  Administered 2020-01-02: 50 ug via INTRAVENOUS
  Administered 2020-01-02: 25 ug via INTRAVENOUS
  Administered 2020-01-02: 50 ug via INTRAVENOUS

## 2020-01-02 MED ORDER — MIDAZOLAM HCL 5 MG/5ML IJ SOLN
INTRAMUSCULAR | Status: DC | PRN
  Administered 2020-01-02: 2 mg via INTRAVENOUS

## 2020-01-02 MED ORDER — FENTANYL CITRATE (PF) 100 MCG/2ML IJ SOLN
INTRAMUSCULAR | Status: AC
  Filled 2020-01-02: qty 2

## 2020-01-02 MED ORDER — PROPOFOL 10 MG/ML IV BOLUS
INTRAVENOUS | Status: AC
  Filled 2020-01-02: qty 40

## 2020-01-02 MED ORDER — CHLORHEXIDINE GLUCONATE CLOTH 2 % EX PADS: 6.0000 | MEDICATED_PAD | Freq: Once | CUTANEOUS | Status: DC

## 2020-01-02 MED ORDER — DEXAMETHASONE SODIUM PHOSPHATE 10 MG/ML IJ SOLN
INTRAMUSCULAR | Status: AC
  Filled 2020-01-02: qty 1

## 2020-01-02 MED ORDER — EPHEDRINE SULFATE 50 MG/ML IJ SOLN
INTRAMUSCULAR | Status: DC | PRN
  Administered 2020-01-02 (×4): 5 mg via INTRAVENOUS

## 2020-01-02 MED ORDER — ACETAMINOPHEN 500 MG PO TABS
ORAL_TABLET | ORAL | Status: AC
  Filled 2020-01-02: qty 2

## 2020-01-02 MED ORDER — ONDANSETRON HCL 4 MG/2ML IJ SOLN
INTRAMUSCULAR | Status: DC | PRN
  Administered 2020-01-02: 4 mg via INTRAVENOUS

## 2020-01-02 MED ORDER — LIDOCAINE HCL (CARDIAC) PF 100 MG/5ML IV SOSY
PREFILLED_SYRINGE | INTRAVENOUS | Status: DC | PRN
  Administered 2020-01-02: 80 mg via INTRAVENOUS

## 2020-01-02 MED ORDER — MIDAZOLAM HCL 2 MG/2ML IJ SOLN
INTRAMUSCULAR | Status: AC
  Filled 2020-01-02: qty 2

## 2020-01-02 MED ORDER — PROPOFOL 10 MG/ML IV BOLUS
INTRAVENOUS | Status: DC | PRN
  Administered 2020-01-02: 150 mg via INTRAVENOUS

## 2020-01-02 MED ORDER — SODIUM CHLORIDE 0.9% FLUSH: 3.0000 mL | Freq: Two times a day (BID) | INTRAVENOUS | Status: DC

## 2020-01-02 MED ORDER — CEFAZOLIN SODIUM-DEXTROSE 2-4 GM/100ML-% IV SOLN
INTRAVENOUS | Status: AC
  Filled 2020-01-02: qty 100

## 2020-01-02 MED ORDER — SODIUM CHLORIDE 0.9 % IV SOLN: 250.0000 mL | INTRAVENOUS | Status: DC | PRN

## 2020-01-02 MED ORDER — DEXAMETHASONE SODIUM PHOSPHATE 10 MG/ML IJ SOLN
INTRAMUSCULAR | Status: DC | PRN
  Administered 2020-01-02: 5 mg via INTRAVENOUS

## 2020-01-02 MED ORDER — LIDOCAINE 2% (20 MG/ML) 5 ML SYRINGE
INTRAMUSCULAR | Status: AC
  Filled 2020-01-02: qty 5

## 2020-01-02 MED ORDER — CEFAZOLIN SODIUM-DEXTROSE 2-4 GM/100ML-% IV SOLN
2.0000 g | INTRAVENOUS | Status: AC
  Administered 2020-01-02: 2 g via INTRAVENOUS

## 2020-01-02 MED ORDER — LACTATED RINGERS IV SOLN: INTRAVENOUS | Status: DC

## 2020-01-02 MED ORDER — SCOPOLAMINE 1 MG/3DAYS TD PT72
MEDICATED_PATCH | TRANSDERMAL | Status: AC
  Filled 2020-01-02: qty 1

## 2020-01-02 MED ORDER — BUPIVACAINE HCL 0.25 % IJ SOLN
INTRAMUSCULAR | Status: DC | PRN
  Administered 2020-01-02: 10 mL

## 2020-01-02 MED ORDER — SCOPOLAMINE 1 MG/3DAYS TD PT72
1.0000 | MEDICATED_PATCH | Freq: Once | TRANSDERMAL | Status: DC
  Administered 2020-01-02: 1.5 mg via TRANSDERMAL

## 2020-01-02 MED ORDER — FENTANYL CITRATE (PF) 100 MCG/2ML IJ SOLN: 25.0000 ug | INTRAMUSCULAR | Status: DC | PRN

## 2020-01-02 MED ORDER — ACETAMINOPHEN 500 MG PO TABS
1000.0000 mg | ORAL_TABLET | Freq: Once | ORAL | Status: AC
  Administered 2020-01-02: 1000 mg via ORAL

## 2020-01-02 MED ORDER — LIDOCAINE-EPINEPHRINE 1 %-1:100000 IJ SOLN
INTRAMUSCULAR | Status: DC | PRN
  Administered 2020-01-02: 10 mL

## 2020-01-02 MED ORDER — ONDANSETRON HCL 4 MG/2ML IJ SOLN
INTRAMUSCULAR | Status: AC
  Filled 2020-01-02: qty 2

## 2020-01-02 MED ORDER — SODIUM CHLORIDE 0.9% FLUSH: 3.0000 mL | INTRAVENOUS | Status: DC | PRN

## 2020-01-02 SURGICAL SUPPLY — 76 items
ADH SKN CLS APL DERMABOND .7 (GAUZE/BANDAGES/DRESSINGS)
BAG DECANTER FOR FLEXI CONT (MISCELLANEOUS) ×3 IMPLANT
BINDER BREAST LRG (GAUZE/BANDAGES/DRESSINGS) ×2 IMPLANT
BINDER BREAST MEDIUM (GAUZE/BANDAGES/DRESSINGS) IMPLANT
BINDER BREAST XLRG (GAUZE/BANDAGES/DRESSINGS) IMPLANT
BINDER BREAST XXLRG (GAUZE/BANDAGES/DRESSINGS) IMPLANT
BIOPATCH RED 1 DISK 7.0 (GAUZE/BANDAGES/DRESSINGS) IMPLANT
BIOPATCH RED 1IN DISK 7.0MM (GAUZE/BANDAGES/DRESSINGS)
BLADE HEX COATED 2.75 (ELECTRODE) ×3 IMPLANT
BLADE SURG 10 STRL SS (BLADE) ×2 IMPLANT
BLADE SURG 15 STRL LF DISP TIS (BLADE) ×2 IMPLANT
BLADE SURG 15 STRL SS (BLADE) ×6
CANISTER SUCT 1200ML W/VALVE (MISCELLANEOUS) ×3 IMPLANT
COVER BACK TABLE 60X90IN (DRAPES) ×3 IMPLANT
COVER MAYO STAND STRL (DRAPES) ×3 IMPLANT
COVER WAND RF STERILE (DRAPES) IMPLANT
DECANTER SPIKE VIAL GLASS SM (MISCELLANEOUS) IMPLANT
DERMABOND ADVANCED (GAUZE/BANDAGES/DRESSINGS)
DERMABOND ADVANCED .7 DNX12 (GAUZE/BANDAGES/DRESSINGS) IMPLANT
DRAIN CHANNEL 19F RND (DRAIN) IMPLANT
DRAPE LAPAROSCOPIC ABDOMINAL (DRAPES) ×3 IMPLANT
DRSG OPSITE POSTOP 4X6 (GAUZE/BANDAGES/DRESSINGS) ×6 IMPLANT
DRSG PAD ABDOMINAL 8X10 ST (GAUZE/BANDAGES/DRESSINGS) ×10 IMPLANT
ELECT BLADE 4.0 EZ CLEAN MEGAD (MISCELLANEOUS) ×3
ELECT REM PT RETURN 9FT ADLT (ELECTROSURGICAL) ×3
ELECTRODE BLDE 4.0 EZ CLN MEGD (MISCELLANEOUS) ×1 IMPLANT
ELECTRODE REM PT RTRN 9FT ADLT (ELECTROSURGICAL) ×1 IMPLANT
EVACUATOR SILICONE 100CC (DRAIN) IMPLANT
FUNNEL KELLER 2 DISP (MISCELLANEOUS) ×2 IMPLANT
GAUZE SPONGE 4X4 12PLY STRL LF (GAUZE/BANDAGES/DRESSINGS) IMPLANT
GLOVE BIO SURGEON STRL SZ 6.5 (GLOVE) ×6 IMPLANT
GLOVE BIO SURGEON STRL SZ7 (GLOVE) ×5 IMPLANT
GLOVE BIO SURGEONS STRL SZ 6.5 (GLOVE) ×3
GLOVE BIOGEL PI IND STRL 7.5 (GLOVE) IMPLANT
GLOVE BIOGEL PI INDICATOR 7.5 (GLOVE) ×2
GOWN STRL REUS W/ TWL LRG LVL3 (GOWN DISPOSABLE) ×2 IMPLANT
GOWN STRL REUS W/TWL LRG LVL3 (GOWN DISPOSABLE) ×6
IMPL BREAST GEL 535CC (Breast) IMPLANT
IMPLANT BREAST GEL 535CC (Breast) ×6 IMPLANT
IV NS 1000ML (IV SOLUTION)
IV NS 1000ML BAXH (IV SOLUTION) IMPLANT
IV NS 500ML (IV SOLUTION)
IV NS 500ML BAXH (IV SOLUTION) IMPLANT
KIT FILL SYSTEM UNIVERSAL (SET/KITS/TRAYS/PACK) IMPLANT
NDL HYPO 25X1 1.5 SAFETY (NEEDLE) ×1 IMPLANT
NDL SAFETY ECLIPSE 18X1.5 (NEEDLE) ×1 IMPLANT
NEEDLE HYPO 18GX1.5 SHARP (NEEDLE) ×3
NEEDLE HYPO 25X1 1.5 SAFETY (NEEDLE) ×3 IMPLANT
PACK BASIN DAY SURGERY FS (CUSTOM PROCEDURE TRAY) ×3 IMPLANT
PENCIL SMOKE EVACUATOR (MISCELLANEOUS) ×3 IMPLANT
PIN SAFETY STERILE (MISCELLANEOUS) IMPLANT
SIZER BREAST GEL REUSE 480CC (SIZER) ×3
SIZER BREAST REUSE 535CC (SIZER) ×3
SIZER BRST GEL REUSE 480CC (SIZER) IMPLANT
SIZER BRST REUSE ULT HI 535CC (SIZER) IMPLANT
SLEEVE SCD COMPRESS KNEE MED (MISCELLANEOUS) ×3 IMPLANT
SPONGE LAP 18X18 RF (DISPOSABLE) ×6 IMPLANT
STRIP SUTURE WOUND CLOSURE 1/2 (MISCELLANEOUS) ×4 IMPLANT
SUT MNCRL AB 4-0 PS2 18 (SUTURE) ×6 IMPLANT
SUT MON AB 3-0 SH 27 (SUTURE) ×6
SUT MON AB 3-0 SH27 (SUTURE) ×4 IMPLANT
SUT MON AB 5-0 PS2 18 (SUTURE) ×5 IMPLANT
SUT PDS 3-0 CT2 (SUTURE) ×18
SUT PDS AB 2-0 CT2 27 (SUTURE) IMPLANT
SUT PDS II 3-0 CT2 27 ABS (SUTURE) IMPLANT
SUT VIC AB 3-0 SH 27 (SUTURE)
SUT VIC AB 3-0 SH 27X BRD (SUTURE) IMPLANT
SUT VICRYL 4-0 PS2 18IN ABS (SUTURE) IMPLANT
SYR BULB IRRIG 60ML STRL (SYRINGE) ×3 IMPLANT
SYR CONTROL 10ML LL (SYRINGE) ×3 IMPLANT
TOWEL GREEN STERILE FF (TOWEL DISPOSABLE) ×6 IMPLANT
TRAY DSU PREP LF (CUSTOM PROCEDURE TRAY) ×3 IMPLANT
TUBE CONNECTING 20'X1/4 (TUBING) ×1
TUBE CONNECTING 20X1/4 (TUBING) ×2 IMPLANT
UNDERPAD 30X36 HEAVY ABSORB (UNDERPADS AND DIAPERS) ×6 IMPLANT
YANKAUER SUCT BULB TIP NO VENT (SUCTIONS) ×3 IMPLANT

## 2020-01-02 NOTE — Anesthesia Procedure Notes (Signed)
Procedure Name: LMA Insertion Date/Time: 01/02/2020 7:35 AM Performed by: Lavonia Dana, CRNA Pre-anesthesia Checklist: Patient identified, Emergency Drugs available, Suction available and Patient being monitored Patient Re-evaluated:Patient Re-evaluated prior to induction Oxygen Delivery Method: Circle system utilized Preoxygenation: Pre-oxygenation with 100% oxygen Induction Type: IV induction Ventilation: Mask ventilation without difficulty LMA: LMA inserted LMA Size: 4.0 Number of attempts: 1 Airway Equipment and Method: Bite block Placement Confirmation: positive ETCO2 Tube secured with: Tape Dental Injury: Teeth and Oropharynx as per pre-operative assessment

## 2020-01-02 NOTE — Op Note (Signed)
Op report Bilateral Exchange   DATE OF OPERATION: 01/02/2020  LOCATION: [place]  SURGICAL DIVISION: Plastic Surgery  PREOPERATIVE DIAGNOSIS:  1. History of breast cancer.  2. Acquired absence of bilateral breast.   POSTOPERATIVE DIAGNOSIS:  1. History of breast cancer.  2. Acquired absence of bilateral breast.   PROCEDURE:  1. Bilateral exchange of tissue expanders for implants.  2. Bilateral capsulotomies for implant respositioning.  SURGEON: Earlean Fidalgo Sanger Kirklin Mcduffee, DO  ASSISTANT: Roetta Sessions, PA  ANESTHESIA:  General.   COMPLICATIONS: None.   IMPLANTS: Left - Mentor Smooth Round Ultra High Profile Gel 535 cc. Ref #[id number].  Serial Number [id number] Right - Mentor Smooth Round Ultra High Profile Gel 535 cc. Ref #[id number].  Serial Number [id number]  INDICATIONS FOR PROCEDURE:  The patient, Tiffany Acevedo, is a 54 y.o. female born on 1965/03/13, is here for treatment after bilateral mastectomies.  She had tissue expanders placed at the time of mastectomies. She now presents for exchange of her expanders for implants.  She requires capsulotomies to better position the implants. MRN: [id number]  CONSENT:  Informed consent was obtained directly from the patient. Risks, benefits and alternatives were fully discussed. Specific risks including but not limited to bleeding, infection, hematoma, seroma, scarring, pain, implant infection, implant extrusion, capsular contracture, asymmetry, wound healing problems, and need for further surgery were all discussed. The patient did have an ample opportunity to have her questions answered to her satisfaction.   DESCRIPTION OF PROCEDURE:  The patient was taken to the operating room. SCDs were placed and IV antibiotics were given. The patient's chest was prepped and draped in a sterile fashion. A time out was performed and the implants to be used were identified.    On the right breast: One percent Lidocaine  with epinephrine was used to infiltrate at the incision site. The old mastectomy scar was excised.  The mastectomy flaps from the superior and inferior flaps were raised over the pectoralis major muscle for several centimeters to minimize tension for the closure. The pectoralis was split inferior to the skin incision to expose and remove the tissue expander.  Inspection of the pocket showed a normal healthy capsule and good integration of the biologic matrix.  The pocket was irrigated with antibiotic solution.  Circumferential capsulotomies were performed to allow for breast pocket expansion. The lateral margin was loss enough that the implant was rolling laterally.  The capsule was therefore medialized by 1.5 cm. This was secured with the 3-0 PDS.  Measurements were made and a sizer used to confirm adequate pocket size for the implant dimensions.  Hemostasis was ensured with electrocautery. New gloves were placed. The implant was soaked in antibiotic solution and then placed in the pocket and oriented appropriately. The pectoralis major muscle and capsule on the anterior surface were re-closed with a 3-0 PSD suture. The remaining skin was closed with 4-0 Monocryl deep dermal and 5-0 Monocryl subcuticular stitches.   On the left breast: The old mastectomy scar was excised.  The mastectomy flaps from the superior and inferior flaps were raised over the pectoralis major muscle for several centimeters to minimize tension for the closure. The pectoralis was split inferior to the skin incision to expose and remove the tissue expander.  Inspection of the pocket showed a normal healthy capsule and good integration of the biologic matrix.   Circumferential capsulotomies were performed to allow for breast pocket expansion.The lateral margin was loss enough that the implant was rolling laterally.  The  capsule was therefore medialized by 1.5 cm. This was secured with the 3-0 PDS.  Additionally at the inframammary fold.  This was raised 5 mm and secured with the 3-0 PDS.  Measurements were made and a sizer utilized to confirm adequate pocket size for the implant dimensions.  Hemostasis was ensured with the electrocautery.  New gloves were applied. The implant was soaked in antibiotic solution and placed in the pocket and oriented appropriately. The pectoralis major muscle and capsule on the anterior surface were re-closed with a 3-0 PDS suture. The remaining skin was closed with 4-0 Monocryl deep dermal and 5-0 Monocryl subcuticular stitches.  Dermabond was applied to the incision site. A breast binder and ABDs were placed.  The patient was allowed to wake from anesthesia and taken to the recovery room in satisfactory condition.   The advanced practice practitioner (APP) assisted throughout the case.  The APP was essential in retraction and counter traction when needed to make the case progress smoothly.  This retraction and assistance made it possible to see the tissue plans for the procedure.  The assistance was needed for blood control, tissue re-approximation and assisted with closure of the incision site.

## 2020-01-02 NOTE — Interval H&P Note (Signed)
History and Physical Interval Note:  01/02/2020 6:54 AM  Tiffany Acevedo  has presented today for surgery, with the diagnosis of Acquired absence of bilateral breasts and nipples.  The various methods of treatment have been discussed with the patient and family. After consideration of risks, benefits and other options for treatment, the patient has consented to  Procedure(s) with comments: REMOVAL OF BILATERAL TISSUE EXPANDERS WITH PLACEMENT OF BILATERAL BREAST IMPLANTS (Bilateral) - 2 hours, please as a surgical intervention.  The patient's history has been reviewed, patient examined, no change in status, stable for surgery.  I have reviewed the patient's chart and labs.  Questions were answered to the patient's satisfaction.     Tiffany Acevedo

## 2020-01-02 NOTE — Anesthesia Postprocedure Evaluation (Signed)
Anesthesia Post Note  Patient: Tiffany Acevedo  Procedure(s) Performed: REMOVAL OF BILATERAL TISSUE EXPANDERS WITH PLACEMENT OF BILATERAL BREAST IMPLANTS (Bilateral Breast)     Patient location during evaluation: PACU Anesthesia Type: General Level of consciousness: awake and alert Pain management: pain level controlled Vital Signs Assessment: post-procedure vital signs reviewed and stable Respiratory status: spontaneous breathing, nonlabored ventilation, respiratory function stable and patient connected to nasal cannula oxygen Cardiovascular status: blood pressure returned to baseline and stable Postop Assessment: no apparent nausea or vomiting Anesthetic complications: no   No complications documented.  Last Vitals:  Vitals:   01/02/20 1000 01/02/20 1037  BP: 134/67 (!) 143/95  Pulse: 89 84  Resp: 12 14  Temp:  37 C  SpO2: 97% 100%    Last Pain:  Vitals:   01/02/20 1016  TempSrc:   PainSc: 3                  Catalina Gravel

## 2020-01-02 NOTE — Anesthesia Preprocedure Evaluation (Addendum)
Anesthesia Evaluation  Patient identified by MRN, date of birth, ID band Patient awake    Reviewed: Allergy & Precautions, NPO status , Patient's Chart, lab work & pertinent test results, reviewed documented beta blocker date and time   Airway Mallampati: II  TM Distance: >3 FB Neck ROM: Full    Dental  (+) Teeth Intact   Pulmonary neg pulmonary ROS,    Pulmonary exam normal breath sounds clear to auscultation       Cardiovascular hypertension, Pt. on medications and Pt. on home beta blockers Normal cardiovascular exam Rhythm:Regular Rate:Normal     Neuro/Psych  Headaches, PSYCHIATRIC DISORDERS Anxiety Depression    GI/Hepatic Neg liver ROS, GERD  Medicated,  Endo/Other  negative endocrine ROS  Renal/GU negative Renal ROS     Musculoskeletal negative musculoskeletal ROS (+)   Abdominal   Peds  Hematology negative hematology ROS (+)   Anesthesia Other Findings Day of surgery medications reviewed with the patient.  Breast cancer s/p mastectomies   Reproductive/Obstetrics                             Anesthesia Physical Anesthesia Plan  ASA: II  Anesthesia Plan: General   Post-op Pain Management:    Induction: Intravenous  PONV Risk Score and Plan: 3 and Midazolam, Scopolamine patch - Pre-op, Dexamethasone and Ondansetron  Airway Management Planned: Oral ETT  Additional Equipment:   Intra-op Plan:   Post-operative Plan: Extubation in OR  Informed Consent: I have reviewed the patients History and Physical, chart, labs and discussed the procedure including the risks, benefits and alternatives for the proposed anesthesia with the patient or authorized representative who has indicated his/her understanding and acceptance.       Plan Discussed with: CRNA  Anesthesia Plan Comments:         Anesthesia Quick Evaluation

## 2020-01-02 NOTE — Transfer of Care (Signed)
Immediate Anesthesia Transfer of Care Note  Patient: Tiffany Acevedo  Procedure(s) Performed: REMOVAL OF BILATERAL TISSUE EXPANDERS WITH PLACEMENT OF BILATERAL BREAST IMPLANTS (Bilateral Breast)  Patient Location: PACU  Anesthesia Type:General  Level of Consciousness: awake, alert  and oriented  Airway & Oxygen Therapy: Patient Spontanous Breathing and Patient connected to face mask oxygen  Post-op Assessment: Report given to RN and Post -op Vital signs reviewed and stable  Post vital signs: Reviewed and stable  Last Vitals:  Vitals Value Taken Time  BP 148/100 01/02/20 0938  Temp    Pulse 94 01/02/20 0940  Resp    SpO2 100 % 01/02/20 0940  Vitals shown include unvalidated device data.  Last Pain:  Vitals:   01/02/20 0636  TempSrc: Oral  PainSc: 0-No pain      Patients Stated Pain Goal: 2 (40/90/50 2561)  Complications: No complications documented.

## 2020-01-02 NOTE — Discharge Instructions (Signed)
No Tylenol before 1pm today.  INSTRUCTIONS FOR AFTER SURGERY   You will likely have some questions about what to expect following your operation.  The following information will help you and your family understand what to expect when you are discharged from the hospital.  Following these guidelines will help ensure a smooth recovery and reduce risks of complications.  Postoperative instructions include information on: diet, wound care, medications and physical activity.  AFTER SURGERY Expect to go home after the procedure.  In some cases, you may need to spend one night in the hospital for observation.  DIET This surgery does not require a specific diet.  However, I have to mention that the healthier you eat the better your body can start healing. It is important to increasing your protein intake.  This means limiting the foods with added sugar.  Focus on fruits and vegetables and some meat. It is very important to drink water after your surgery.  If your urine is bright yellow, then it is concentrated, and you need to drink more water.  As a general rule after surgery, you should have 8 ounces of water every hour while awake.  If you find you are persistently nauseated or unable to take in liquids let us know.  NO TOBACCO USE or EXPOSURE.  This will slow your healing process and increase the risk of a wound.  WOUND CARE If you don't have a drain: You can shower the day after surgery.  Use fragrance free soap.  Dial, [place], [place] and Cetaphil are usually mild on the skin.    If you have steri-strips / tape directly attached to your skin leave them in place. It is OK to get these wet.  No baths, pools or hot tubs for two weeks. We close your incision to leave the smallest and best-looking scar. No ointment or creams on your incisions until given the go ahead.  Especially not Neosporin (Too many skin reactions with this one).  A few weeks after surgery you can use Mederma and start massaging the  scar. We ask you to wear your binder or sports bra for the first 6 weeks around the clock, including while sleeping. This provides added comfort and helps reduce the fluid accumulation at the surgery site.  ACTIVITY No heavy lifting until cleared by the doctor.  It is OK to walk and climb stairs. In fact, moving your legs is very important to decrease your risk of a blood clot.  It will also help keep you from getting deconditioned.  Every 1 to 2 hours get up and walk for 5 minutes. This will help with a quicker recovery back to normal.  Let pain be your guide so you don't do too much.  NO, you cannot do the spring cleaning and don't plan on taking care of anyone else.  This is your time for TLC.   WORK Everyone returns to work at different times. As a rough guide, most people take at least 1 - 2 weeks off prior to returning to work. If you need documentation for your job, bring the forms to your postoperative follow up visit.  DRIVING Arrange for someone to bring you home from the hospital.  You may be able to drive a few days after surgery but not while taking any narcotics or valium.  BOWEL MOVEMENTS Constipation can occur after anesthesia and while taking pain medication.  It is important to stay ahead for your comfort.  We recommend taking Milk of  Magnesia (2 tablespoons; twice a day) while taking the pain pills.  SEROMA This is fluid your body tried to put in the surgical site.  This is normal but if it creates excessive pain and swelling let us know.  It usually decreases in a few weeks.  MEDICATIONS and PAIN CONTROL At your preoperative visit for you history and physical you were given the following medications: 1. An antibiotic: Start this medication when you get home and take according to the instructions on the bottle. 2. Zofran 4 mg:  This is to treat nausea and vomiting.  You can take this every 6 hours as needed and only if needed. 3. Norco (hydrocodone/acetaminophen) 5/325 mg:   This is only to be used after you have taken the motrin or the tylenol. Every 8 hours as needed. Over the counter Medication to take: 4. Ibuprofen (Motrin) 600 mg:  Take this every 6 hours.  If you have additional pain then take 500 mg of the tylenol.  Only take the Norco after you have tried these two. 5. Miralax or stool softener of choice: Take this according to the bottle if you take the [place] Call your surgeon's office if any of the following occur: . Fever 101 degrees F or greater . Excessive bleeding or fluid from the incision site. . Pain that increases over time without aid from the medications . Redness, warmth, or pus draining from incision sites . Persistent nausea or inability to take in liquids . Severe misshapen area that underwent the operation.  May take Tylenol after 1pm, if needed.    Postoperative Anesthesia Instructions-Pediatric  Activity: Your child should rest for the remainder of the day. A responsible individual must stay with your child for 24 hours.  Meals: Your child should start with liquids and light foods such as gelatin or soup unless otherwise instructed by the physician. Progress to regular foods as tolerated. Avoid spicy, greasy, and heavy foods. If nausea and/or vomiting occur, drink only clear liquids such as apple juice or Pedialyte until the nausea and/or vomiting subsides. Call your physician if vomiting continues.  Special Instructions/Symptoms: Your child may be drowsy for the rest of the day, although some children experience some hyperactivity a few hours after the surgery. Your child may also experience some irritability or crying episodes due to the operative procedure and/or anesthesia. Your child's throat may feel dry or sore from the anesthesia or the breathing tube placed in the throat during surgery. Use throat lozenges, sprays, or ice chips if needed.

## 2020-01-04 LAB — SURGICAL PATHOLOGY

## 2020-01-07 ENCOUNTER — Encounter (HOSPITAL_BASED_OUTPATIENT_CLINIC_OR_DEPARTMENT_OTHER): Payer: Self-pay | Admitting: Plastic Surgery

## 2020-01-08 ENCOUNTER — Other Ambulatory Visit: Payer: Self-pay

## 2020-01-08 ENCOUNTER — Ambulatory Visit (INDEPENDENT_AMBULATORY_CARE_PROVIDER_SITE_OTHER): Payer: BC Managed Care – PPO | Admitting: Plastic Surgery

## 2020-01-08 ENCOUNTER — Encounter: Payer: Self-pay | Admitting: Plastic Surgery

## 2020-01-08 VITALS — BP 130/78 | HR 82 | Temp 98.7°F

## 2020-01-08 DIAGNOSIS — Z9013 Acquired absence of bilateral breasts and nipples: Secondary | ICD-10-CM

## 2020-01-08 NOTE — Progress Notes (Signed)
The patient is a 54 year old female here for follow-up after undergoing removal of her expanders and placement of implants.  Overall she is doing really well.  Her broken left humerus is doing well and has not hurt since surgery.  Honeycomb dressings are still in place.  She does have some swelling and a little bit of bruising.  No sign of seroma or hematoma.  We will plan to see her back in a week and remove the dressing.  Continue with the sports bra and pain med as needed.

## 2020-01-17 ENCOUNTER — Encounter: Payer: Self-pay | Admitting: Plastic Surgery

## 2020-01-17 ENCOUNTER — Ambulatory Visit (INDEPENDENT_AMBULATORY_CARE_PROVIDER_SITE_OTHER): Payer: BC Managed Care – PPO | Admitting: Plastic Surgery

## 2020-01-17 ENCOUNTER — Other Ambulatory Visit: Payer: Self-pay

## 2020-01-17 DIAGNOSIS — Z9889 Other specified postprocedural states: Secondary | ICD-10-CM

## 2020-01-17 NOTE — Progress Notes (Signed)
Patient is a 54 year old female here for for follow-up after undergoing bilateral exchange of tissue expanders for implants and capsulectomies for repositioning on 01/02/2020 with Dr. Marla Roe.  ~ 2 weeks PO Patient presents today with her husband.  Reports she is doing very well.  Denies fever/chills, nausea/vomiting, pain.  Honeycomb dressings removed today.  Steri-Strips in place.  Bilateral incisions are intact, clean and dry.  No signs of infection, redness, drainage.  She has some mild swelling on the superior lateral aspect of her right breast.  She may shower normally, avoid scrubbing the breast.  Continue to wear sports bra 24/7 for 4 more weeks.  May do light massage inward and downward on breasts.  Continue to avoid heavy lifting until 6 weeks postop.  Follow-up in 3 weeks.  Call office with any questions/concerns.  Return precautions provided.

## 2020-01-22 ENCOUNTER — Ambulatory Visit: Payer: BC Managed Care – PPO | Admitting: Physical Therapy

## 2020-01-23 ENCOUNTER — Ambulatory Visit: Payer: BC Managed Care – PPO | Attending: Orthopedic Surgery | Admitting: Physical Therapy

## 2020-01-23 ENCOUNTER — Encounter: Payer: BC Managed Care – PPO | Admitting: Plastic Surgery

## 2020-01-23 ENCOUNTER — Encounter: Payer: Self-pay | Admitting: Physical Therapy

## 2020-01-23 ENCOUNTER — Other Ambulatory Visit: Payer: Self-pay

## 2020-01-23 DIAGNOSIS — M25512 Pain in left shoulder: Secondary | ICD-10-CM | POA: Diagnosis present

## 2020-01-23 DIAGNOSIS — M25612 Stiffness of left shoulder, not elsewhere classified: Secondary | ICD-10-CM | POA: Diagnosis present

## 2020-01-23 NOTE — Therapy (Signed)
[facility] [address], [place], [address] Phone: [phone]   Fax:  [phone]  Physical Therapy Evaluation  Patient Details  Name: Tiffany Acevedo MRN: [id number] Date of Birth: 03-04-1965 No data recorded  Encounter Date: 01/23/2020    Past Medical History:  Diagnosis Date  . Allergy   . Anxiety   . Breast cancer ([place])   . Depression   . Excessive weight gain 06/30/2016  . Family history of colon cancer   . Family history of colon cancer   . Family history of prostate cancer   . Frequent headaches   . GERD (gastroesophageal reflux disease)   . History of kidney stones   . Hypertension   . Migraines   . Pre-diabetes     Past Surgical History:  Procedure Laterality Date  . BREAST BIOPSY Right 07/13/2019   stereo biopsy/ x clip/path pending  . BREAST BIOPSY Right 07/13/2019   stereo biopsy/coil clip/ path pending  . BREAST RECONSTRUCTION WITH PLACEMENT OF TISSUE EXPANDER AND FLEX HD (ACELLULAR HYDRATED DERMIS) Bilateral 08/27/2019   Procedure: BILATERAL BREAST RECONSTRUCTION WITH PLACEMENT OF TISSUE EXPANDER AND FLEX HD (ACELLULAR HYDRATED DERMIS);  Surgeon: Wallace Going, DO;  Location: [facility];  Service: Plastics;  Laterality: Bilateral;  . [place], 2004, 2013  . COLONOSCOPY  2015  . CYSTOSCOPY W/ RETROGRADES Right 09/13/2018   Procedure: CYSTOSCOPY WITH RETROGRADE PYELOGRAM;  Surgeon: Abbie Sons, MD;  Location: [facility];  Service: Urology;  Laterality: Right;  . CYSTOSCOPY/URETEROSCOPY/HOLMIUM LASER/STENT PLACEMENT Left 05/09/2018   Procedure: CYSTOSCOPY/URETEROSCOPY/HOLMIUM LASER/STENT PLACEMENT;  Surgeon: Abbie Sons, MD;  Location: [facility];  Service: Urology;  Laterality: Left;  . CYSTOSCOPY/URETEROSCOPY/HOLMIUM LASER/STENT PLACEMENT Right 09/13/2018   Procedure: CYSTOSCOPY/URETEROSCOPY/HOLMIUM LASER/STENT PLACEMENT;  Surgeon: Abbie Sons, MD;   Location: [facility];  Service: Urology;  Laterality: Right;  . INCONTINENCE SURGERY     BLADDER TUCK  . PILONIDAL CYST EXCISION    . REMOVAL OF BILATERAL TISSUE EXPANDERS WITH PLACEMENT OF BILATERAL BREAST IMPLANTS Bilateral 01/02/2020   Procedure: REMOVAL OF BILATERAL TISSUE EXPANDERS WITH PLACEMENT OF BILATERAL BREAST IMPLANTS;  Surgeon: Wallace Going, DO;  Location: [place];  Service: Plastics;  Laterality: Bilateral;  2 hours, please  . STONE EXTRACTION WITH BASKET Right 09/13/2018   Procedure: STONE EXTRACTION WITH BASKET;  Surgeon: Abbie Sons, MD;  Location: [facility];  Service: Urology;  Laterality: Right;  . TOTAL MASTECTOMY Bilateral 08/27/2019   Procedure: TOTAL MASTECTOMY;  Surgeon: Herbert Pun, MD;  Location: [facility];  Service: General;  Laterality: Bilateral;  START @930  DUE TO SENTINEL NODE  . UPPER GI ENDOSCOPY      There were no vitals filed for this visit.         OBJECTIVE  MUSCULOSKELETAL: Tremor: Normal Bulk: Normal Tone: Normal  Cervical Screen AROM: WFL and painless with overpressure in all planes Repeated movement: No centralization or peripheralization with protraction or retraction  Elbow Screen Elbow AROM: WNL  Palpation TTP at UT and bicep muscle belly with trigger points. Secondary c/o pain at pec minor/major with implant appearing slightly elevated Posture: L shoulder rounding/protraction>R with apparent guarding with adduction and slight elbow flexion. In supine patient with shoulder elevation + protraction, is able to retract scapula to touch table   Strength R/L 5/2+ Shoulder flexion (anterior deltoid/pec major/coracobrachialis, axillary n. (C5-6) and musculocutaneous n. (C5-7)) 5/2+ Shoulder abduction (deltoid/supraspinatus, axillary/suprascapular n, C5) 5/2+ Shoulder external rotation (  infraspinatus/teres minor) 5/2+ Shoulder internal rotation (subcapularis/lats/pec major) 5/2+ Shoulder extension  (posterior deltoid, lats, teres major, axillary/thoracodorsal n.) 5/5 Shoulder horizontal abduction 5/5 Elbow flexion (biceps brachii, brachialis, brachioradialis, musculoskeletal n, C5-6) 5/5 Elbow extension (triceps, radial n, C7) 5/5 Wrist Extension 5/5 Wrist Flexion 5/5 Finger adduction (interossei, ulnar n, T1)  AROM R/L 180/39 Shoulder flexion 180/39 Shoulder abduction 90/L ear Shoulder external rotation 70/L PSIS Shoulder internal rotation 60/60 Shoulder extension *Indicates pain, overpressure performed unless otherwise indicated  PROM R/L 180/87 Shoulder flexion 180/73 Shoulder abduction 90/29 Shoulder external rotation 70/54 Shoulder internal rotation *Indicates pain, overpressure performed unless otherwise indicated  Muscle Length Testing Pectoralis Minor:Shortened on L  Biceps: R:Shortened on L  NEUROLOGICAL:  Mental Status Patient is oriented to person, place and time.  Recent memory is intact.  Remote memory is intact.  Attention span and concentration are intact.  Expressive speech is intact.  Patient's fund of knowledge is within normal limits for educational level.  All GHJ mobilizations with guarding, grades 1-2 only   Ther-Ex PT reviewed the following HEP with patient with patient able to demonstrate a set of the following with min cuing for correction needed. PT educated patient on parameters of therex (how/when to inc/decrease intensity, frequency, rep/set range, stretch hold time, and purpose of therex) with verbalized understanding.  Access Code: [email or web address] Seated Shoulder Flexion Towel Slide at Table Top - 2-3 x daily - 7 x weekly - 12-15 reps - 3sec hold Seated Shoulder Abduction Towel Slide at Table Top - 2-3 x daily - 7 x weekly - 12-15 reps Seated Scapular Retraction - 2-3 x daily - 7 x weekly - 10-15 reps - 3sec hold Supine Pec Stretch - 2-3 x daily - 7 x weekly - 60sec hold                 Objective measurements completed on  examination: See above findings.                 PT Short Term Goals - 01/25/20 1002      PT SHORT TERM GOAL #1   Title Pt will be independent with HEP in order to improve strength and decrease pain in order to improve pain-free function at home and work.    Baseline 01/23/20 HEP given    Time 4    Period Weeks    Status New             PT Long Term Goals - 01/25/20 1003      PT LONG TERM GOAL #1   Title Pt will demonstrate full active L shoulder ROM in order to complete self care and overhead ADLS    Baseline 86/75/44 flex 39d abd 39d  ER L ear IR L PSIS    Time 8    Period Weeks    Status New      PT LONG TERM GOAL #2   Title Pt will decrease worst pain as reported on NPRS by at least 3 points in order to demonstrate clinically significant reduction in pain.    Baseline 01/25/20 8/10    Time 8    Period Weeks    Status New      PT LONG TERM GOAL #3   Title Pt will demonstrate L gross shoulder strength of 5/5 in order to demonstrate PLOF and strength needed for heavy household tasks    Baseline 01/23/20 flex 2+ abd 2+ ER 2+ IR 2+    Time 8  Period Weeks    Status New      PT LONG TERM GOAL #4   Title Patient will increase FOTO score to 79 to demonstrate predicted increase in functional mobility to complete ADLs                  Plan - 01/25/20 0938    Clinical Impression Statement Pt is a 54year old female presenting s/p non-operative non-displaced proximal humerus fracture 01/01/20. Impairments in L shoulder PROM/AROM, L shoulder and periscapular strength, decreased motor control, and pain. Activity limitations in pushing/pulling, reaching overhead, reaching behind back, dressing, grooming, and lifting; inhibiting full participation in ind ADLs and with her job as a Production assistant, radio.    Personal Factors and Comorbidities Comorbidity 1;Comorbidity 2;Comorbidity 3+;Past/Current Experience;Fitness;Time since onset of injury/illness/exacerbation     Comorbidities breast cancer, HTN, GERD, anxiety    Examination-Activity Limitations Bathing;Lift;Transfers;Reach Overhead;Carry;Dressing    Examination-Participation Restrictions Yard Work;Cleaning;Community Activity;Driving;Occupation;Laundry    Stability/Clinical Decision Making Evolving/Moderate complexity    Clinical Decision Making Moderate    Rehab Potential Good    PT Frequency 2x / week    PT Duration 8 weeks    PT Treatment/Interventions ADLs/Self Care Home Management;Electrical Stimulation;Therapeutic exercise;Balance training;Joint Manipulations;Taping;Spinal Manipulations;Cryotherapy;Iontophoresis 50m/ml Dexamethasone;DME Instruction;Neuromuscular re-education;Gait training;Stair training;Functional mobility training;Moist Heat;Traction;Ultrasound;Therapeutic activities;Patient/family education;Manual techniques;Dry needling;Passive range of motion;Vestibular    PT Next Visit Plan PROM/AAROM, FOTO    PT Home Exercise Plan AAROM towel slides on table    Consulted and Agree with Plan of Care Patient           Patient will benefit from skilled therapeutic intervention in order to improve the following deficits and impairments:  Decreased balance,Impaired flexibility,Impaired UE functional use,Hypomobility,Decreased strength,Decreased range of motion,Decreased endurance,Decreased activity tolerance,Postural dysfunction,Increased muscle spasms,Hypermobility,Decreased mobility,Decreased coordination,Abnormal gait,Pain,Improper body mechanics,Increased fascial restricitons  Visit Diagnosis: Acute pain of left shoulder  Stiffness of left shoulder, not elsewhere classified     Problem List Patient Active Problem List   Diagnosis Date Noted  . Sebaceous cyst 12/31/2019  . Breast wound 10/05/2019  . Acquired absence of bilateral breasts and nipples 09/04/2019  . Genetic testing 08/10/2019  . Monoallelic mutation of CHEK2 gene in female patient 08/07/2019  . Malignant neoplasm  of upper-outer quadrant of right breast in female, estrogen receptor positive [place] 07/30/2019  . Goals of care, counseling/discussion 07/30/2019  . Family history of prostate cancer   . Family history of colon cancer   . [place] 07/25[place]  . Left lower quadrant pain 05/23/2018  . Personal history of kidney stones 04/20/2018  . Microscopic hematuria 04/20/2018  . 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 Overweight (BMI 25.0-29.9) 01/22/2017  . Encounter for preventive health examination 06/30/2016  . Renal colic 065/78/4696 . Ureteral calculus 04/12/2015  . Nephrolithiasis 03/04/2015  . Intractable migraine with aura without status migrainosus 10/29/2013  . Lack of libido 06/24/2013  . Migraine syndrome 03/11/2013  . Generalized anxiety disorder 03/11/2013   CDurwin RegesDPT CDurwin Reges12/17/2021, 10:16 AM  [facility] [address] [place] [address]Phone: [phone]  Fax:  [phone] Name: Tiffany CerroMRN: [id number] of Birth: [birth year]

## 2020-01-28 ENCOUNTER — Ambulatory Visit: Payer: BC Managed Care – PPO | Admitting: Physical Therapy

## 2020-01-28 ENCOUNTER — Other Ambulatory Visit: Payer: Self-pay

## 2020-01-28 ENCOUNTER — Encounter: Payer: Self-pay | Admitting: Physical Therapy

## 2020-01-28 DIAGNOSIS — M25512 Pain in left shoulder: Secondary | ICD-10-CM | POA: Diagnosis not present

## 2020-01-28 DIAGNOSIS — M25612 Stiffness of left shoulder, not elsewhere classified: Secondary | ICD-10-CM

## 2020-01-28 NOTE — Therapy (Signed)
[facility] [address] [place], [address] Phone: [phone]   Fax:  [phone]  Physical Therapy Treatment  Patient Details  Name: Tiffany Acevedo MRN: [id number] Date of Birth: 12-Feb-1965 No data recorded  Encounter Date: 01/28/2020   PT End of Session - 01/28/20 1627    Visit Number 2    Number of Visits 17    Date for PT Re-Evaluation 03/21/20    PT Start Time 0407    PT Stop Time 0445    PT Time Calculation (min) 38 min    Activity Tolerance Patient tolerated treatment well    Behavior During Therapy [facility] for tasks assessed/performed           Past Medical History:  Diagnosis Date  . Allergy   . Anxiety   . Breast cancer ([place])   . Depression   . Excessive weight gain 06/30/2016  . Family history of colon cancer   . Family history of colon cancer   . Family history of prostate cancer   . Frequent headaches   . GERD (gastroesophageal reflux disease)   . History of kidney stones   . Hypertension   . Migraines   . Pre-diabetes     Past Surgical History:  Procedure Laterality Date  . BREAST BIOPSY Right 07/13/2019   stereo biopsy/ x clip/path pending  . BREAST BIOPSY Right 07/13/2019   stereo biopsy/coil clip/ path pending  . BREAST RECONSTRUCTION WITH PLACEMENT OF TISSUE EXPANDER AND FLEX HD (ACELLULAR HYDRATED DERMIS) Bilateral 08/27/2019   Procedure: BILATERAL BREAST RECONSTRUCTION WITH PLACEMENT OF TISSUE EXPANDER AND FLEX HD (ACELLULAR HYDRATED DERMIS);  Surgeon: Wallace Going, DO;  Location: [facility];  Service: Plastics;  Laterality: Bilateral;  . [place], 2004, 2013  . COLONOSCOPY  2015  . CYSTOSCOPY W/ RETROGRADES Right 09/13/2018   Procedure: CYSTOSCOPY WITH RETROGRADE PYELOGRAM;  Surgeon: Abbie Sons, MD;  Location: [facility];  Service: Urology;  Laterality: Right;  . CYSTOSCOPY/URETEROSCOPY/HOLMIUM LASER/STENT PLACEMENT Left 05/09/2018    Procedure: CYSTOSCOPY/URETEROSCOPY/HOLMIUM LASER/STENT PLACEMENT;  Surgeon: Abbie Sons, MD;  Location: [facility];  Service: Urology;  Laterality: Left;  . CYSTOSCOPY/URETEROSCOPY/HOLMIUM LASER/STENT PLACEMENT Right 09/13/2018   Procedure: CYSTOSCOPY/URETEROSCOPY/HOLMIUM LASER/STENT PLACEMENT;  Surgeon: Abbie Sons, MD;  Location: [facility];  Service: Urology;  Laterality: Right;  . INCONTINENCE SURGERY     BLADDER TUCK  . PILONIDAL CYST EXCISION    . REMOVAL OF BILATERAL TISSUE EXPANDERS WITH PLACEMENT OF BILATERAL BREAST IMPLANTS Bilateral 01/02/2020   Procedure: REMOVAL OF BILATERAL TISSUE EXPANDERS WITH PLACEMENT OF BILATERAL BREAST IMPLANTS;  Surgeon: Wallace Going, DO;  Location: [place];  Service: Plastics;  Laterality: Bilateral;  2 hours, please  . STONE EXTRACTION WITH BASKET Right 09/13/2018   Procedure: STONE EXTRACTION WITH BASKET;  Surgeon: Abbie Sons, MD;  Location: [facility];  Service: Urology;  Laterality: Right;  . TOTAL MASTECTOMY Bilateral 08/27/2019   Procedure: TOTAL MASTECTOMY;  Surgeon: Herbert Pun, MD;  Location: [facility];  Service: General;  Laterality: Bilateral;  START _0  DUE TO SENTINEL NODE  . UPPER GI ENDOSCOPY      There were no vitals filed for this visit.   Subjective Assessment - 01/28/20 1611    Subjective Patient reports compliance with HEP, no pain today.    Pertinent History Pt is a 54 year old female s/p L proximal humerus fracture 12/02/10. PMH double masectomy 08/27/19 with expander placement and reconstruction  finished 01/03/20.Pt fell in [place] walking straight forward onto her shoulder. Patient reports fracture was non-displaced and she did not have surgery, by Dr. Adrian Prince advisement, wore sling 6 weeks. Currently does not know orthopedic expectations for weight restrictions/movement resistrictions, but has not been lifting more than 1/2 gallon of milk, but is no longer wearing sling. Pt is a Psychologist, counselling, and has trouble typing at higher desks d/t decreased movement. Patient reports motion is very restricted and she is unable to don/doff clothes, her husband washes her hair for her, unable to push/pull, or lift anything. Pt is an avid golfer, and walks often. Worst shoulder pain over the past week 8/10 best 0/10 but reports she avoids painful movement.  Pt is R hand dominant. Pt denies N/V, B&B changes, unexplained weight fluctuation, saddle paresthesia, fever, night sweats, or unrelenting night pain at this time.    Limitations Lifting;House hold activities;Writing    How long can you sit comfortably? unlimited    How long can you stand comfortably? unlimited    How long can you walk comfortably? unlimited    Diagnostic tests Xrays [facility] 12/03/19    Patient Stated Goals Full ROM to complete self care    Pain Orientation Lateral    Pain Onset More than a month ago           Ther-Ex Seated Shoulder Flexion Towel Slide at Table Topx12 3sec hold Seated Shoulder Abduction Towel Slide at Table Top x12 3sec hold Supine dowel flex AAROM x10 Supine dowel abd AAROM x10  Supine pec stretch 2x 63mn at approx 60d  Standing ER twist in door x12 3sec hold Forward walk UE ranger x12 3sec hold with support for lower UE ranger circles 5x clockwise 5x counterclockwise Seated theraball rollouts x12 3sec  Seated Scapular Retraction     Manual PROM all directions in pain free range                       PT Education - 01/28/20 1614    Education Details therex form, HEP review    Person(s) Educated Patient    Methods Explanation;Demonstration;Verbal cues    Comprehension Verbalized understanding;Returned demonstration;Verbal cues required            PT Short Term Goals - 01/25/20 1002      PT SHORT TERM GOAL #1   Title Pt will be independent with HEP in order to improve strength and decrease pain in order to improve pain-free function at home and work.     Baseline 01/23/20 HEP given    Time 4    Period Weeks    Status New             PT Long Term Goals - 01/25/20 1003      PT LONG TERM GOAL #1   Title Pt will demonstrate full active L shoulder ROM in order to complete self care and overhead ADLS    Baseline 156/38/93flex 39d abd 39d  ER L ear IR L PSIS    Time 8    Period Weeks    Status New      PT LONG TERM GOAL #2   Title Pt will decrease worst pain as reported on NPRS by at least 3 points in order to demonstrate clinically significant reduction in pain.    Baseline 01/25/20 8/10    Time 8    Period Weeks    Status New      PT  LONG TERM GOAL #3   Title Pt will demonstrate L gross shoulder strength of 5/5 in order to demonstrate PLOF and strength needed for heavy household tasks    Baseline 01/23/20 flex 2+ abd 2+ ER 2+ IR 2+    Time 8    Period Weeks    Status New      PT LONG TERM GOAL #4   Title Patient will increase FOTO score to 79 to demonstrate predicted increase in functional mobility to complete ADLs                 Plan - 01/28/20 1629    Clinical Impression Statement PT initiated therex progression for shoulder mobility with success. Patient is able to tolerate PROm and planned therex with good motivation and carry over of all cuing for proper technique. PT will continue progression as able.    Personal Factors and Comorbidities Comorbidity 1;Comorbidity 2;Comorbidity 3+;Past/Current Experience;Fitness;Time since onset of injury/illness/exacerbation    Comorbidities breast cancer, HTN, GERD, anxiety    Examination-Activity Limitations Bathing;Lift;Transfers;Reach Overhead;Carry;Dressing    Examination-Participation Restrictions Yard Work;Cleaning;Community Activity;Driving;Occupation;Laundry    Stability/Clinical Decision Making Evolving/Moderate complexity    Clinical Decision Making Moderate    Rehab Potential Good    PT Frequency 2x / week    PT Duration 8 weeks    PT Treatment/Interventions  ADLs/Self Care Home Management;Electrical Stimulation;Therapeutic exercise;Balance training;Joint Manipulations;Taping;Spinal Manipulations;Cryotherapy;Iontophoresis 60m/ml Dexamethasone;DME Instruction;Neuromuscular re-education;Gait training;Stair training;Functional mobility training;Moist Heat;Traction;Ultrasound;Therapeutic activities;Patient/family education;Manual techniques;Dry needling;Passive range of motion;Vestibular    PT Next Visit Plan PROM/AAROM, FOTO    PT Home Exercise Plan AAROM towel slides on table    Consulted and Agree with Plan of Care Patient           Patient will benefit from skilled therapeutic intervention in order to improve the following deficits and impairments:  Decreased balance,Impaired flexibility,Impaired UE functional use,Hypomobility,Decreased strength,Decreased range of motion,Decreased endurance,Decreased activity tolerance,Postural dysfunction,Increased muscle spasms,Hypermobility,Decreased mobility,Decreased coordination,Abnormal gait,Pain,Improper body mechanics,Increased fascial restricitons  Visit Diagnosis: Acute pain of left shoulder  Stiffness of left shoulder, not elsewhere classified     Problem List Patient Active Problem List   Diagnosis Date Noted  . Sebaceous cyst 12/31/2019  . Breast wound 10/05/2019  . Acquired absence of bilateral breasts and nipples 09/04/2019  . Genetic testing 08/10/2019  . Monoallelic mutation of CHEK2 gene in female patient 08/07/2019  . Malignant neoplasm of upper-outer quadrant of right breast in female, estrogen receptor positive ([place] 07/30/2019  . Goals of care, counseling/discussion 07/30/2019  . Family history of prostate cancer   . Family history of colon cancer   . Breast cancer ([place] 07/25/2019  . Left lower quadrant pain 05/23/2018  . Personal history of kidney stones 04/20/2018  . Microscopic hematuria 04/20/2018  . 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 Overweight (BMI  25.0-29.9) 01/22/2017  . Encounter for preventive health examination 06/30/2016  . Renal colic 020/35/5974 . Ureteral calculus 04/12/2015  . Nephrolithiasis 03/04/2015  . Intractable migraine with aura without status migrainosus 10/29/2013  . Lack of libido 06/24/2013  . Migraine syndrome 03/11/2013  . Generalized anxiety disorder 03/11/2013   CDurwin RegesDPT  CDurwin Reges12/20/2021, 4:45 PM  [facility] [address] [place] [address]Phone: [phone]  Fax:  [phone] Name: TFederica AllportMRN: [id number] of Birth: 1May 09, 1967

## 2020-01-30 ENCOUNTER — Ambulatory Visit: Payer: BC Managed Care – PPO | Admitting: Physical Therapy

## 2020-01-30 ENCOUNTER — Encounter: Payer: Self-pay | Admitting: Physical Therapy

## 2020-01-30 ENCOUNTER — Other Ambulatory Visit: Payer: Self-pay

## 2020-01-30 DIAGNOSIS — M25512 Pain in left shoulder: Secondary | ICD-10-CM

## 2020-01-30 DIAGNOSIS — M25612 Stiffness of left shoulder, not elsewhere classified: Secondary | ICD-10-CM

## 2020-01-30 NOTE — Therapy (Signed)
[facility] [address], [place], [address] Phone: [phone]   Fax:  [phone]  Physical Therapy Treatment  Patient Details  Name: Tiffany Acevedo MRN: [id number] Date of Birth: [birth year] No data recorded  Encounter Date: 01/30/2020   PT End of Session - 01/30/20 1313    Visit Number 3    Number of Visits 17    Date for PT Re-Evaluation 03/21/20    PT Start Time 0108    PT Stop Time 0146    PT Time Calculation (min) 38 min    Activity Tolerance Patient tolerated treatment well    Behavior During Therapy [facility] for tasks assessed/performed           Past Medical History:  Diagnosis Date  . Allergy   . Anxiety   . Breast cancer ([place])   . Depression   . Excessive weight gain 06/30/2016  . Family history of colon cancer   . Family history of colon cancer   . Family history of prostate cancer   . Frequent headaches   . GERD (gastroesophageal reflux disease)   . History of kidney stones   . Hypertension   . Migraines   . Pre-diabetes     Past Surgical History:  Procedure Laterality Date  . BREAST BIOPSY Right 07/13/2019   stereo biopsy/ x clip/path pending  . BREAST BIOPSY Right 07/13/2019   stereo biopsy/coil clip/ path pending  . BREAST RECONSTRUCTION WITH PLACEMENT OF TISSUE EXPANDER AND FLEX HD (ACELLULAR HYDRATED DERMIS) Bilateral 08/27/2019   Procedure: BILATERAL BREAST RECONSTRUCTION WITH PLACEMENT OF TISSUE EXPANDER AND FLEX HD (ACELLULAR HYDRATED DERMIS);  Surgeon: Wallace Going, DO;  Location: [facility];  Service: Plastics;  Laterality: Bilateral;  . [place], 2004, 2013  . COLONOSCOPY  2015  . CYSTOSCOPY W/ RETROGRADES Right 09/13/2018   Procedure: CYSTOSCOPY WITH RETROGRADE PYELOGRAM;  Surgeon: Abbie Sons, MD;  Location: [facility];  Service: Urology;  Laterality: Right;  . CYSTOSCOPY/URETEROSCOPY/HOLMIUM LASER/STENT PLACEMENT Left 05/09/2018    Procedure: CYSTOSCOPY/URETEROSCOPY/HOLMIUM LASER/STENT PLACEMENT;  Surgeon: Abbie Sons, MD;  Location: [facility];  Service: Urology;  Laterality: Left;  . CYSTOSCOPY/URETEROSCOPY/HOLMIUM LASER/STENT PLACEMENT Right 09/13/2018   Procedure: CYSTOSCOPY/URETEROSCOPY/HOLMIUM LASER/STENT PLACEMENT;  Surgeon: Abbie Sons, MD;  Location: [facility];  Service: Urology;  Laterality: Right;  . INCONTINENCE SURGERY     BLADDER TUCK  . PILONIDAL CYST EXCISION    . REMOVAL OF BILATERAL TISSUE EXPANDERS WITH PLACEMENT OF BILATERAL BREAST IMPLANTS Bilateral 01/02/2020   Procedure: REMOVAL OF BILATERAL TISSUE EXPANDERS WITH PLACEMENT OF BILATERAL BREAST IMPLANTS;  Surgeon: Wallace Going, DO;  Location: [place];  Service: Plastics;  Laterality: Bilateral;  2 hours, please  . STONE EXTRACTION WITH BASKET Right 09/13/2018   Procedure: STONE EXTRACTION WITH BASKET;  Surgeon: Abbie Sons, MD;  Location: [facility];  Service: Urology;  Laterality: Right;  . TOTAL MASTECTOMY Bilateral 08/27/2019   Procedure: TOTAL MASTECTOMY;  Surgeon: Herbert Pun, MD;  Location: [facility];  Service: General;  Laterality: Bilateral;  START _0  DUE TO SENTINEL NODE  . UPPER GI ENDOSCOPY      There were no vitals filed for this visit.   Subjective Assessment - 01/30/20 1312    Subjective Pt reports compliance with HEP, no pain today. Was a little sore following last PT session but is doing okay    Pertinent History Pt is a 54 year old female s/p L proximal  humerus fracture 12/02/10. PMH double masectomy 08/27/19 with expander placement and reconstruction finished 01/03/20.Pt fell in [place] walking straight forward onto her shoulder. Patient reports fracture was non-displaced and she did not have surgery, by Dr. Adrian Prince advisement, wore sling 6 weeks. Currently does not know orthopedic expectations for weight restrictions/movement resistrictions, but has not been lifting more than 1/2 gallon of milk,  but is no longer wearing sling. 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 Patient reports motion is very restricted and she is unable to don/doff clothes, her husband washes her hair for her, unable to push/pull, or lift anything. Pt is an avid golfer, and walks often. Worst shoulder pain over the past week 8/10 best 0/10 but reports she avoids painful movement.  Pt is R hand dominant. Pt denies N/V, B&B changes, unexplained weight fluctuation, saddle paresthesia, fever, night sweats, or unrelenting night pain at this time.    Limitations Lifting;House hold activities;Writing    How long can you sit comfortably? unlimited    How long can you stand comfortably? unlimited    How long can you walk comfortably? unlimited    Diagnostic tests Xrays [facility] 12/03/19    Pain Onset More than a month ago              Ther-Ex Pulleys abd x15; flex x15 with cuing for set up and posture with good carry over following Supine dowel flex AAROM x15 Supine dowel abd AAROM x15 Supine dowel ER <> IR AAROM x15 UE ranger circles 15x clockwise 15x counterclockwise Isometric flex/ER/abd 5x 5sec with good carry over following Pec door stretch 30sec   Manual PROM all directions in pain free range  AROM Flex 84d abd 45                       PT Education - 01/30/20 1313    Education Details therex form/technique    Person(s) Educated Patient    Methods Explanation;Demonstration;Verbal cues    Comprehension Verbalized understanding;Returned demonstration;Verbal cues required            PT Short Term Goals - 01/25/20 1002      PT SHORT TERM GOAL #1   Title Pt will be independent with HEP in order to improve strength and decrease pain in order to improve pain-free function at home and work.    Baseline 01/23/20 HEP given    Time 4    Period Weeks    Status New             PT Long Term Goals - 01/25/20 1003      PT  LONG TERM GOAL #1   Title Pt will demonstrate full active L shoulder ROM in order to complete self care and overhead ADLS    Baseline 50/27/74 flex 39d abd 39d  ER L ear IR L PSIS    Time 8    Period Weeks    Status New      PT LONG TERM GOAL #2   Title Pt will decrease worst pain as reported on NPRS by at least 3 points in order to demonstrate clinically significant reduction in pain.    Baseline 01/25/20 8/10    Time 8    Period Weeks    Status New      PT LONG TERM GOAL #3   Title Pt will demonstrate L gross shoulder strength of 5/5 in order to demonstrate PLOF and strength  needed for heavy household tasks    Baseline 01/23/20 flex 2+ abd 2+ ER 2+ IR 2+    Time 8    Period Weeks    Status New      PT LONG TERM GOAL #4   Title Patient will increase FOTO score to 79 to demonstrate predicted increase in functional mobility to complete ADLs                 Plan - 01/30/20 1318    Clinical Impression Statement PT continued therex progression for increased shoulder mobility with aid of manual techniques with success. Patient is able to comply with all cuing for proper technique of therex with good motivation throughout session. Patient with increased pain at end range of all mobility therex, PT attempting to maintain motion in pain free or low threshold range. PT will continue progression as able.    Personal Factors and Comorbidities Comorbidity 1;Comorbidity 2;Comorbidity 3+;Past/Current Experience;Fitness;Time since onset of injury/illness/exacerbation    Comorbidities breast cancer, HTN, GERD, anxiety    Examination-Activity Limitations Bathing;Lift;Transfers;Reach Overhead;Carry;Dressing    Examination-Participation Restrictions Yard Work;Cleaning;Community Activity;Driving;Occupation;Laundry    Stability/Clinical Decision Making Evolving/Moderate complexity    Clinical Decision Making Moderate    Rehab Potential Good    PT Frequency 2x / week    PT Duration 8 weeks     PT Treatment/Interventions ADLs/Self Care Home Management;Electrical Stimulation;Therapeutic exercise;Balance training;Joint Manipulations;Taping;Spinal Manipulations;Cryotherapy;Iontophoresis 70m/ml Dexamethasone;DME Instruction;Neuromuscular re-education;Gait training;Stair training;Functional mobility training;Moist Heat;Traction;Ultrasound;Therapeutic activities;Patient/family education;Manual techniques;Dry needling;Passive range of motion;Vestibular    PT Next Visit Plan PROM/AAROM, FOTO    PT Home Exercise Plan AAROM towel slides on table    Consulted and Agree with Plan of Care Patient           Patient will benefit from skilled therapeutic intervention in order to improve the following deficits and impairments:  Decreased balance,Impaired flexibility,Impaired UE functional use,Hypomobility,Decreased strength,Decreased range of motion,Decreased endurance,Decreased activity tolerance,Postural dysfunction,Increased muscle spasms,Hypermobility,Decreased mobility,Decreased coordination,Abnormal gait,Pain,Improper body mechanics,Increased fascial restricitons  Visit Diagnosis: Acute pain of left shoulder  Stiffness of left shoulder, not elsewhere classified     Problem List Patient Active Problem List   Diagnosis Date Noted  . Sebaceous cyst 12/31/2019  . Breast wound 10/05/2019  . Acquired absence of bilateral breasts and nipples 09/04/2019  . Genetic testing 08/10/2019  . Monoallelic mutation of CHEK2 gene in female patient 08/07/2019  . Malignant neoplasm of upper-outer quadrant of right breast in female, estrogen receptor positive [place] 07/30/2019  . Goals of care, counseling/discussion 07/30/2019  . Family history of prostate cancer   . Family history of colon cancer   . [place] cancer ([place] 07/25/2019  . Left lower quadrant pain 05/23/2018  . Personal history of kidney stones 04/20/2018  . Microscopic hematuria 04/20/2018  . 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 Overweight (BMI 25.0-29.9) 01/22/2017  . Encounter for preventive health examination 06/30/2016  . Renal colic 074/01/8785 . Ureteral calculus 04/12/2015  . Nephrolithiasis 03/04/2015  . Intractable migraine with aura without status migrainosus 10/29/2013  . Lack of libido 06/24/2013  . Migraine syndrome 03/11/2013  . Generalized anxiety disorder 03/11/2013   CDurwin RegesDPT CDurwin Reges12/22/2021, 1:45 PM  [facility] [address] [place] [id number]: [phone]  Fax:  [phone] Name: TSamanthajo PayanoMRN: [id number] of Birth: 121-Mar-1967

## 2020-02-03 NOTE — Progress Notes (Signed)
Patient is a 54 year old female here for follow-up after undergoing bilateral exchange of tissue expanders for implants and capsulectomies for repositioning on 01/02/2020 with Dr. Marla Roe.  ~ 5 weeks PO Patient presents today with her daughter.  Reports she is feeling very good.  Denies fever/chills, nausea/vomiting, pain.  Breathing is nonlabored and pulse is regular.  Bilateral incisions are healing very nicely, C/D/I.  No signs of infection, redness, drainage, or seroma.  Small suture knot was removed from the right breast lateral end of the incision.  She has a little more fullness on the right breast superior lateral aspect than the left.  She will give it a little more time to settle out but may like to consider some fat grafting for improved symmetry.  No other concerns.  Follow-up in 4 to 6 weeks.  Return precautions provided.  Call office with any questions/concerns.

## 2020-02-06 ENCOUNTER — Encounter: Payer: Self-pay | Admitting: Plastic Surgery

## 2020-02-06 ENCOUNTER — Telehealth: Payer: Self-pay

## 2020-02-06 ENCOUNTER — Other Ambulatory Visit: Payer: Self-pay

## 2020-02-06 ENCOUNTER — Ambulatory Visit (INDEPENDENT_AMBULATORY_CARE_PROVIDER_SITE_OTHER): Payer: BC Managed Care – PPO | Admitting: Plastic Surgery

## 2020-02-06 VITALS — BP 117/69 | HR 86 | Temp 98.6°F

## 2020-02-06 DIAGNOSIS — Z9889 Other specified postprocedural states: Secondary | ICD-10-CM

## 2020-02-06 NOTE — Telephone Encounter (Signed)
Called patient, under the advise of Loistine Simas, she is able to sleep on her side now. Will need to wait one more week to be able to wear bras with no underwires.  Patient understood and agreed.

## 2020-02-12 ENCOUNTER — Encounter: Payer: Self-pay | Admitting: Physical Therapy

## 2020-02-12 ENCOUNTER — Other Ambulatory Visit: Payer: Self-pay

## 2020-02-12 ENCOUNTER — Ambulatory Visit: Payer: BC Managed Care – PPO | Attending: Oncology | Admitting: Physical Therapy

## 2020-02-12 DIAGNOSIS — M25612 Stiffness of left shoulder, not elsewhere classified: Secondary | ICD-10-CM

## 2020-02-12 DIAGNOSIS — Z9013 Acquired absence of bilateral breasts and nipples: Secondary | ICD-10-CM | POA: Diagnosis present

## 2020-02-12 DIAGNOSIS — M25611 Stiffness of right shoulder, not elsewhere classified: Secondary | ICD-10-CM | POA: Diagnosis present

## 2020-02-12 DIAGNOSIS — M6281 Muscle weakness (generalized): Secondary | ICD-10-CM | POA: Insufficient documentation

## 2020-02-12 DIAGNOSIS — M25512 Pain in left shoulder: Secondary | ICD-10-CM | POA: Insufficient documentation

## 2020-02-12 NOTE — Therapy (Signed)
[facility] [address], [place], [address] Phone: [phone]   Fax:  [phone]  Physical Therapy Treatment  Patient Details  Name: Tiffany Acevedo MRN: [id number] Date of Birth: [birth year] No data recorded  Encounter Date: 02/12/2020   PT End of Session - 02/12/20 1625    Visit Number 4    Number of Visits 17    Date for PT Re-Evaluation 03/21/20    PT Start Time 0419    PT Stop Time 0500    PT Time Calculation (min) 41 min    Activity Tolerance Patient tolerated treatment well    Behavior During Therapy [facility] for tasks assessed/performed           Past Medical History:  Diagnosis Date  . Allergy   . Anxiety   . Breast cancer ([place])   . Depression   . Excessive weight gain 06/30/2016  . Family history of colon cancer   . Family history of colon cancer   . Family history of prostate cancer   . Frequent headaches   . GERD (gastroesophageal reflux disease)   . History of kidney stones   . Hypertension   . Migraines   . Pre-diabetes     Past Surgical History:  Procedure Laterality Date  . BREAST BIOPSY Right 07/13/2019   stereo biopsy/ x clip/path pending  . BREAST BIOPSY Right 07/13/2019   stereo biopsy/coil clip/ path pending  . BREAST RECONSTRUCTION WITH PLACEMENT OF TISSUE EXPANDER AND FLEX HD (ACELLULAR HYDRATED DERMIS) Bilateral 08/27/2019   Procedure: BILATERAL BREAST RECONSTRUCTION WITH PLACEMENT OF TISSUE EXPANDER AND FLEX HD (ACELLULAR HYDRATED DERMIS);  Surgeon: Wallace Going, DO;  Location: [facility];  Service: Plastics;  Laterality: Bilateral;  . [place], 2004, 2013  . COLONOSCOPY  2015  . CYSTOSCOPY W/ RETROGRADES Right 09/13/2018   Procedure: CYSTOSCOPY WITH RETROGRADE PYELOGRAM;  Surgeon: Abbie Sons, MD;  Location: [facility];  Service: Urology;  Laterality: Right;  . CYSTOSCOPY/URETEROSCOPY/HOLMIUM LASER/STENT PLACEMENT Left 05/09/2018    Procedure: CYSTOSCOPY/URETEROSCOPY/HOLMIUM LASER/STENT PLACEMENT;  Surgeon: Abbie Sons, MD;  Location: [facility];  Service: Urology;  Laterality: Left;  . CYSTOSCOPY/URETEROSCOPY/HOLMIUM LASER/STENT PLACEMENT Right 09/13/2018   Procedure: CYSTOSCOPY/URETEROSCOPY/HOLMIUM LASER/STENT PLACEMENT;  Surgeon: Abbie Sons, MD;  Location: [facility];  Service: Urology;  Laterality: Right;  . INCONTINENCE SURGERY     BLADDER TUCK  . PILONIDAL CYST EXCISION    . REMOVAL OF BILATERAL TISSUE EXPANDERS WITH PLACEMENT OF BILATERAL BREAST IMPLANTS Bilateral 01/02/2020   Procedure: REMOVAL OF BILATERAL TISSUE EXPANDERS WITH PLACEMENT OF BILATERAL BREAST IMPLANTS;  Surgeon: Wallace Going, DO;  Location: [place];  Service: Plastics;  Laterality: Bilateral;  2 hours, please  . STONE EXTRACTION WITH BASKET Right 09/13/2018   Procedure: STONE EXTRACTION WITH BASKET;  Surgeon: Abbie Sons, MD;  Location: [facility];  Service: Urology;  Laterality: Right;  . TOTAL MASTECTOMY Bilateral 08/27/2019   Procedure: TOTAL MASTECTOMY;  Surgeon: Herbert Pun, MD;  Location: [facility];  Service: General;  Laterality: Bilateral;  START _0  DUE TO SENTINEL NODE  . UPPER GI ENDOSCOPY      There were no vitals filed for this visit.   Subjective Assessment - 02/12/20 1621    Subjective Pt reports she is had some increased pain holding the phone on her shoulder and trying to fall asleep last night. Reports 4/10 pain currently. Is completing HEP.    Pertinent History Pt is  a 55 year old female s/p L proximal humerus fracture 12/02/10. PMH double masectomy 08/27/19 with expander placement and reconstruction finished 01/03/20.Pt fell in [place] walking straight forward onto her shoulder. Patient reports fracture was non-displaced and she did not have surgery, by Dr. Adrian Prince advisement, wore sling 6 weeks. Currently does not know orthopedic expectations for weight restrictions/movement resistrictions, but  has not been lifting more than 1/2 gallon of milk, but is no longer wearing sling. 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 Patient reports motion is very restricted and she is unable to don/doff clothes, her husband washes her hair for her, unable to push/pull, or lift anything. Pt is an avid golfer, and walks often. Worst shoulder pain over the past week 8/10 best 0/10 but reports she avoids painful movement.  Pt is R hand dominant. Pt denies N/V, B&B changes, unexplained weight fluctuation, saddle paresthesia, fever, night sweats, or unrelenting night pain at this time.    Limitations Lifting;House hold activities;Writing    How long can you sit comfortably? unlimited    How long can you stand comfortably? unlimited    How long can you walk comfortably? unlimited    Diagnostic tests Xrays [facility] 12/03/19    Patient Stated Goals Full ROM to complete self care    Pain Onset More than a month ago              Ther-Ex Pulleys abd x15; flex x15 with cuing for set up and posture with good carry over following Supine dowel flex AAROM x12 Supine active flex x10  Supine dowel abd AAROM x15 Sidelying active abd x10 Supine dowel ER <> IR AAROM x15 Sidelying ER x10 with towel under elbow Pec door stretch 30sec   PT reviewed the following HEP with patient with patient able to demonstrate a set of the following with min cuing for correction needed. PT educated patient on parameters of therex (how/when to inc/decrease intensity, frequency, rep/set range, stretch hold time, and purpose of therex) with verbalized understanding.  Access Code: [email or web address] Seated Shoulder Flexion AAROM with Pulley Behind - 1 x daily - 7 x weekly - 3 sets - 12-20 reps - 2sec hold Seated Shoulder Abduction AAROM with Pulley Behind - 1 x daily - 7 x weekly - 3 sets - 12-20 reps - 2sec hold Supine Shoulder External Rotation in 45 Degrees Abduction AAROM with Dowel -  1 x daily - 7 x weekly - 3 sets - 12-20 reps - 2sec hold Standing Shoulder Internal Rotation AAROM Behind Back with Towel - 1 x daily - 7 x weekly - 3 sets - 12-20 reps - 2sec hold Supine Shoulder Flexion Extension Full Range AROM - 1 x daily - 3 x weekly - 3 sets - 10 reps Sidelying Shoulder Abduction - 1 x daily - 3 x weekly - 3 sets - 10 reps Sidelying Shoulder External Rotation - 1 x daily - 3 x weekly - 3 sets - 10 reps  Manual PROM all directions in pain free range AROM Flex 96d abd 70d                        PT Education - 02/12/20 1624    Education Details therex form/technique    Person(s) Educated Patient    Methods Explanation;Demonstration;Verbal cues    Comprehension Verbalized understanding;Returned demonstration;Verbal cues required            PT Short  Term Goals - 01/25/20 1002      PT SHORT TERM GOAL #1   Title Pt will be independent with HEP in order to improve strength and decrease pain in order to improve pain-free function at home and work.    Baseline 01/23/20 HEP given    Time 4    Period Weeks    Status New             PT Long Term Goals - 01/25/20 1003      PT LONG TERM GOAL #1   Title Pt will demonstrate full active L shoulder ROM in order to complete self care and overhead ADLS    Baseline 78/29/56 flex 39d abd 39d  ER L ear IR L PSIS    Time 8    Period Weeks    Status New      PT LONG TERM GOAL #2   Title Pt will decrease worst pain as reported on NPRS by at least 3 points in order to demonstrate clinically significant reduction in pain.    Baseline 01/25/20 8/10    Time 8    Period Weeks    Status New      PT LONG TERM GOAL #3   Title Pt will demonstrate L gross shoulder strength of 5/5 in order to demonstrate PLOF and strength needed for heavy household tasks    Baseline 01/23/20 flex 2+ abd 2+ ER 2+ IR 2+    Time 8    Period Weeks    Status New      PT LONG TERM GOAL #4   Title Patient will increase FOTO  score to 79 to demonstrate predicted increase in functional mobility to complete ADLs                 Plan - 02/12/20 1630    Clinical Impression Statement PT continued therex progression for increased shoulder mobility, with continued use of manual techniques to aid in this. Patient is able to achieve more mobility with HEP updated to reflect progression with AROM progression. Pt is motivated throughout session and able to complete all therex for proper technique following cuing. PT will continue progressiona sable.    Personal Factors and Comorbidities Comorbidity 1;Comorbidity 2;Comorbidity 3+;Past/Current Experience;Fitness;Time since onset of injury/illness/exacerbation    Comorbidities breast cancer, HTN, GERD, anxiety    Examination-Activity Limitations Bathing;Lift;Transfers;Reach Overhead;Carry;Dressing    Examination-Participation Restrictions Yard Work;Cleaning;Community Activity;Driving;Occupation;Laundry    Stability/Clinical Decision Making Evolving/Moderate complexity    Clinical Decision Making Moderate    Rehab Potential Good    PT Frequency 2x / week    PT Duration 8 weeks    PT Treatment/Interventions ADLs/Self Care Home Management;Electrical Stimulation;Therapeutic exercise;Balance training;Joint Manipulations;Taping;Spinal Manipulations;Cryotherapy;Iontophoresis 97m/ml Dexamethasone;DME Instruction;Neuromuscular re-education;Gait training;Stair training;Functional mobility training;Moist Heat;Traction;Ultrasound;Therapeutic activities;Patient/family education;Manual techniques;Dry needling;Passive range of motion;Vestibular    PT Next Visit Plan PROM/AAROM, FOTO    PT Home Exercise Plan AAROM towel slides on table    Consulted and Agree with Plan of Care Patient           Patient will benefit from skilled therapeutic intervention in order to improve the following deficits and impairments:  Decreased balance,Impaired flexibility,Impaired UE functional  use,Hypomobility,Decreased strength,Decreased range of motion,Decreased endurance,Decreased activity tolerance,Postural dysfunction,Increased muscle spasms,Hypermobility,Decreased mobility,Decreased coordination,Abnormal gait,Pain,Improper body mechanics,Increased fascial restricitons  Visit Diagnosis: Acute pain of left shoulder  Stiffness of left shoulder, not elsewhere classified     Problem List Patient Active Problem List   Diagnosis Date Noted  . Sebaceous cyst 12/31/2019  .  Breast wound 10/05/2019  . Acquired absence of bilateral breasts and nipples 09/04/2019  . Genetic testing 08/10/2019  . Monoallelic mutation of CHEK2 gene in female patient 08/07/2019  . Malignant neoplasm of upper-outer quadrant of right breast in female, estrogen receptor positive ([place]) 07/30/2019  . Goals of care, counseling/discussion 07/30/2019  . Family history of prostate cancer   . Family history of colon cancer   . Breast cancer ([place]) 07/25/2019  . Left lower quadrant pain 05/23/2018  . Personal history of kidney stones 04/20/2018  . Microscopic hematuria 04/20/2018  . 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 Overweight (BMI 25.0-29.9) 01/22/2017  . Encounter for preventive health examination 06/30/2016  . Renal colic 35/59/7416  . Ureteral calculus 04/12/2015  . Nephrolithiasis 03/04/2015  . Intractable migraine with aura without status migrainosus 10/29/2013  . Lack of libido 06/24/2013  . Migraine syndrome 03/11/2013  . Generalized anxiety disorder 03/11/2013   Durwin Reges DPT Durwin Reges 02/12/2020, 5:02 PM  [facility] [facility] [address], [place], [address] Phone: [phone]   Fax:  [phone]  Name: Tiffany Acevedo MRN: [id number] Date of Birth: 09/10/1965

## 2020-02-14 ENCOUNTER — Encounter: Payer: Self-pay | Admitting: Physical Therapy

## 2020-02-14 ENCOUNTER — Other Ambulatory Visit: Payer: Self-pay

## 2020-02-14 ENCOUNTER — Ambulatory Visit: Payer: BC Managed Care – PPO

## 2020-02-14 DIAGNOSIS — M6281 Muscle weakness (generalized): Secondary | ICD-10-CM

## 2020-02-14 DIAGNOSIS — M25612 Stiffness of left shoulder, not elsewhere classified: Secondary | ICD-10-CM

## 2020-02-14 DIAGNOSIS — M25611 Stiffness of right shoulder, not elsewhere classified: Secondary | ICD-10-CM

## 2020-02-14 DIAGNOSIS — Z9013 Acquired absence of bilateral breasts and nipples: Secondary | ICD-10-CM

## 2020-02-14 DIAGNOSIS — M25512 Pain in left shoulder: Secondary | ICD-10-CM | POA: Diagnosis not present

## 2020-02-14 NOTE — Therapy (Signed)
[facility] [address], [place], [address] Phone: [phone]   Fax:  [phone]  Physical Therapy Treatment  Patient Details  Name: Tiffany Acevedo MRN: [id number] Date of Birth: [birth year] No data recorded  Encounter Date: 02/14/2020   PT End of Session - 02/14/20 1616    Visit Number 5    Number of Visits 17    Date for PT Re-Evaluation 03/21/20    PT Start Time 9233    PT Stop Time 1645    PT Time Calculation (min) 35 min    Activity Tolerance Patient tolerated treatment well    Behavior During Therapy [facility] for tasks assessed/performed           Past Medical History:  Diagnosis Date  . Allergy   . Anxiety   . Breast cancer ([place])   . Depression   . Excessive weight gain 06/30/2016  . Family history of colon cancer   . Family history of colon cancer   . Family history of prostate cancer   . Frequent headaches   . GERD (gastroesophageal reflux disease)   . History of kidney stones   . Hypertension   . Migraines   . Pre-diabetes     Past Surgical History:  Procedure Laterality Date  . BREAST BIOPSY Right 07/13/2019   stereo biopsy/ x clip/path pending  . BREAST BIOPSY Right 07/13/2019   stereo biopsy/coil clip/ path pending  . BREAST RECONSTRUCTION WITH PLACEMENT OF TISSUE EXPANDER AND FLEX HD (ACELLULAR HYDRATED DERMIS) Bilateral 08/27/2019   Procedure: BILATERAL BREAST RECONSTRUCTION WITH PLACEMENT OF TISSUE EXPANDER AND FLEX HD (ACELLULAR HYDRATED DERMIS);  Surgeon: Wallace Going, DO;  Location: [facility];  Service: Plastics;  Laterality: Bilateral;  . [place], 2004, 2013  . COLONOSCOPY  2015  . CYSTOSCOPY W/ RETROGRADES Right 09/13/2018   Procedure: CYSTOSCOPY WITH RETROGRADE PYELOGRAM;  Surgeon: Abbie Sons, MD;  Location: [facility];  Service: Urology;  Laterality: Right;  . CYSTOSCOPY/URETEROSCOPY/HOLMIUM LASER/STENT PLACEMENT Left 05/09/2018    Procedure: CYSTOSCOPY/URETEROSCOPY/HOLMIUM LASER/STENT PLACEMENT;  Surgeon: Abbie Sons, MD;  Location: [facility];  Service: Urology;  Laterality: Left;  . CYSTOSCOPY/URETEROSCOPY/HOLMIUM LASER/STENT PLACEMENT Right 09/13/2018   Procedure: CYSTOSCOPY/URETEROSCOPY/HOLMIUM LASER/STENT PLACEMENT;  Surgeon: Abbie Sons, MD;  Location: [facility];  Service: Urology;  Laterality: Right;  . INCONTINENCE SURGERY     BLADDER TUCK  . PILONIDAL CYST EXCISION    . REMOVAL OF BILATERAL TISSUE EXPANDERS WITH PLACEMENT OF BILATERAL BREAST IMPLANTS Bilateral 01/02/2020   Procedure: REMOVAL OF BILATERAL TISSUE EXPANDERS WITH PLACEMENT OF BILATERAL BREAST IMPLANTS;  Surgeon: Wallace Going, DO;  Location: [facility];  Service: Plastics;  Laterality: Bilateral;  2 hours, please  . STONE EXTRACTION WITH BASKET Right 09/13/2018   Procedure: STONE EXTRACTION WITH BASKET;  Surgeon: Abbie Sons, MD;  Location: [facility];  Service: Urology;  Laterality: Right;  . TOTAL MASTECTOMY Bilateral 08/27/2019   Procedure: TOTAL MASTECTOMY;  Surgeon: Herbert Pun, MD;  Location: [facility];  Service: General;  Laterality: Bilateral;  START @930  DUE TO SENTINEL NODE  . UPPER GI ENDOSCOPY      There were no vitals filed for this visit.   Subjective Assessment - 02/14/20 1613    Subjective Patient reported that family just got into town and is excited to see them. Stated she always hurts but sometimes its not unbearable. Stated she tried yesterday to hold the phone with her shoulder again  even though that had really hurt in the past    Pertinent History Pt is a 55 year old female s/p L proximal humerus fracture 12/02/10. PMH double masectomy 08/27/19 with expander placement and reconstruction finished 01/03/20.Pt fell in [place] walking straight forward onto her shoulder. Patient reports fracture was non-displaced and she did not have surgery, by Dr. Adrian Prince advisement, wore sling 6 weeks. Currently does  not know orthopedic expectations for weight restrictions/movement resistrictions, but has not been lifting more than 1/2 gallon of milk, but is no longer wearing sling. 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 Patient reports motion is very restricted and she is unable to Acevedo/doff clothes, her husband washes her hair for her, unable to push/pull, or lift anything. Pt is an avid golfer, and walks often. Worst shoulder pain over the past week 8/10 best 0/10 but reports she avoids painful movement.  Pt is R hand dominant. Pt denies N/V, B&B changes, unexplained weight fluctuation, saddle paresthesia, fever, night sweats, or unrelenting night pain at this time.    Limitations Lifting;House hold activities;Writing    How long can you sit comfortably? unlimited    How long can you stand comfortably? unlimited    How long can you walk comfortably? unlimited    Diagnostic tests Xrays [facility] 12/03/19    Patient Stated Goals Full ROM to complete self care    Currently in Pain? Yes    Pain Score 3     Pain Location Shoulder    Pain Orientation Left;Anterior    Pain Descriptors / Indicators Jabbing    Pain Type Acute pain              Ther-Ex Pulleys abd x15; flex x15 with cuing for set up and posture with good carry over following Supine dowel flex AAROM x12 Supine active flex x12  Supine dowel abd AAROM x15 Sidelying active abd x12 Supine dowel ER <> IR AAROM x15 Sidelying ER x15 with towel under elbow    Manual PROM all directions in pain free range AROM Flex 105deg abd 80d   Pt response/clinical impression: Pt late to session, shortened accordingly. Pt did exhibit improved ROM throughout session, as well as improved PROM from previous session. Pt still has significant pain with all shoulder movements. The patient would benefit from further skilled PT intervention to progress as able and return to PLOF.       PT Education -  02/14/20 1614    Education Details therex form/technique    Person(s) Educated Patient    Methods Explanation;Demonstration;Tactile cues    Comprehension Verbalized understanding;Returned demonstration;Verbal cues required;Tactile cues required            PT Short Term Goals - 01/25/20 1002      PT SHORT TERM GOAL #1   Title Pt will be independent with HEP in order to improve strength and decrease pain in order to improve pain-free function at home and work.    Baseline 01/23/20 HEP given    Time 4    Period Weeks    Status New             PT Long Term Goals - 01/25/20 1003      PT LONG TERM GOAL #1   Title Pt will demonstrate full active L shoulder ROM in order to complete self care and overhead ADLS    Baseline 94/70/96 flex 39d abd 39d  ER L ear IR L PSIS  Time 8    Period Weeks    Status New      PT LONG TERM GOAL #2   Title Pt will decrease worst pain as reported on NPRS by at least 3 points in order to demonstrate clinically significant reduction in pain.    Baseline 01/25/20 8/10    Time 8    Period Weeks    Status New      PT LONG TERM GOAL #3   Title Pt will demonstrate L gross shoulder strength of 5/5 in order to demonstrate PLOF and strength needed for heavy household tasks    Baseline 01/23/20 flex 2+ abd 2+ ER 2+ IR 2+    Time 8    Period Weeks    Status New      PT LONG TERM GOAL #4   Title Patient will increase FOTO score to 79 to demonstrate predicted increase in functional mobility to complete ADLs                 Plan - 02/14/20 1615    Clinical Impression Statement Pt late to session, shortened accordingly. Pt did exhibit improved ROM throughout session, as well as improved PROM from previous session. Pt still has significant pain with all shoulder movements. The patient would benefit from further skilled PT intervention to progress as able and return to PLOF.    Personal Factors and Comorbidities Comorbidity 1;Comorbidity  2;Comorbidity 3+;Past/Current Experience;Fitness;Time since onset of injury/illness/exacerbation    Comorbidities breast cancer, HTN, GERD, anxiety    Examination-Activity Limitations Bathing;Lift;Transfers;Reach Overhead;Carry;Dressing    Examination-Participation Restrictions Yard Work;Cleaning;Community Activity;Driving;Occupation;Laundry    Stability/Clinical Decision Making Evolving/Moderate complexity    Rehab Potential Good    PT Frequency 2x / week    PT Duration 8 weeks    PT Treatment/Interventions ADLs/Self Care Home Management;Electrical Stimulation;Therapeutic exercise;Balance training;Joint Manipulations;Taping;Spinal Manipulations;Cryotherapy;Iontophoresis 34m/ml Dexamethasone;DME Instruction;Neuromuscular re-education;Gait training;Stair training;Functional mobility training;Moist Heat;Traction;Ultrasound;Therapeutic activities;Patient/family education;Manual techniques;Dry needling;Passive range of motion;Vestibular    PT Next Visit Plan PROM/AAROM, FOTO    PT Home Exercise Plan AAROM towel slides on table    Consulted and Agree with Plan of Care Patient           Patient will benefit from skilled therapeutic intervention in order to improve the following deficits and impairments:  Decreased balance,Impaired flexibility,Impaired UE functional use,Hypomobility,Decreased strength,Decreased range of motion,Decreased endurance,Decreased activity tolerance,Postural dysfunction,Increased muscle spasms,Hypermobility,Decreased mobility,Decreased coordination,Abnormal gait,Pain,Improper body mechanics,Increased fascial restricitons  Visit Diagnosis: Acute pain of left shoulder  Stiffness of left shoulder, not elsewhere classified  Status post bilateral mastectomy  Shoulder joint stiffness, bilateral  Muscle weakness (generalized)     Problem List Patient Active Problem List   Diagnosis Date Noted  . Sebaceous cyst 12/31/2019  . Breast wound 10/05/2019  . Acquired absence  of bilateral breasts and nipples 09/04/2019  . Genetic testing 08/10/2019  . Monoallelic mutation of CHEK2 gene in female patient 08/07/2019  . Malignant neoplasm of upper-outer quadrant of right breast in female, estrogen receptor positive ([place] 07/30/2019  . Goals of care, counseling/discussion 07/30/2019  . Family history of prostate cancer   . Family history of colon cancer   . Breast cancer ([place] 07/25/2019  . Left lower quadrant pain 05/23/2018  . Personal history of kidney stones 04/20/2018  . Microscopic hematuria 04/20/2018  . 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 Overweight (BMI 25.0-29.9) 01/22/2017  . Encounter for preventive health examination 06/30/2016  . Renal colic 071/69/6789 . Ureteral calculus 04/12/2015  .  Nephrolithiasis 03/04/2015  . Intractable migraine with aura without status migrainosus 10/29/2013  . Lack of libido 06/24/2013  . Migraine syndrome 03/11/2013  . Generalized anxiety disorder 03/11/2013    Lieutenant Diego PT, DPT 4:49 PM,02/14/20   [facility] [address] [place], [address] Phone: [phone]   Fax:  [phone]  Name: Tiffany Acevedo MRN: [id number] Date of Birth: 15-May-1965

## 2020-02-19 ENCOUNTER — Inpatient Hospital Stay: Payer: BC Managed Care – PPO | Attending: Oncology | Admitting: Oncology

## 2020-02-19 ENCOUNTER — Other Ambulatory Visit: Payer: Self-pay

## 2020-02-19 ENCOUNTER — Ambulatory Visit: Payer: BC Managed Care – PPO | Admitting: Physical Therapy

## 2020-02-19 ENCOUNTER — Encounter: Payer: Self-pay | Admitting: Oncology

## 2020-02-19 ENCOUNTER — Encounter: Payer: Self-pay | Admitting: Physical Therapy

## 2020-02-19 DIAGNOSIS — M25512 Pain in left shoulder: Secondary | ICD-10-CM | POA: Diagnosis not present

## 2020-02-19 DIAGNOSIS — M25612 Stiffness of left shoulder, not elsewhere classified: Secondary | ICD-10-CM

## 2020-02-19 DIAGNOSIS — Z79810 Long term (current) use of selective estrogen receptor modulators (SERMs): Secondary | ICD-10-CM

## 2020-02-19 DIAGNOSIS — D0511 Intraductal carcinoma in situ of right breast: Secondary | ICD-10-CM

## 2020-02-19 DIAGNOSIS — Z5181 Encounter for therapeutic drug level monitoring: Secondary | ICD-10-CM

## 2020-02-19 NOTE — Progress Notes (Signed)
Patient stated that she fell and broke her left shoulder on 12/02/19 she been going to physical therapy to help with her shoulder. She had several X ray's from [place]

## 2020-02-19 NOTE — Therapy (Signed)
[facility] [address] [place], [address] Phone: [phone]   Fax:  [phone]  Physical Therapy Treatment  Patient Details  Name: Tiffany Acevedo MRN: [id number] Date of Birth: 1965-10-14 No data recorded  Encounter Date: 02/19/2020   PT End of Session - 02/19/20 1630    Visit Number 6    Number of Visits 17    Date for PT Re-Evaluation 03/21/20    PT Start Time 0416    PT Stop Time 0500    PT Time Calculation (min) 44 min    Activity Tolerance Patient tolerated treatment well    Behavior During Therapy [facility] for tasks assessed/performed           Past Medical History:  Diagnosis Date  . Allergy   . Anxiety   . Breast cancer ([place])   . Depression   . Excessive weight gain 06/30/2016  . Family history of colon cancer   . Family history of colon cancer   . Family history of prostate cancer   . Frequent headaches   . GERD (gastroesophageal reflux disease)   . History of kidney stones   . Hypertension   . Migraines   . Pre-diabetes     Past Surgical History:  Procedure Laterality Date  . BREAST BIOPSY Right 07/13/2019   stereo biopsy/ x clip/path pending  . BREAST BIOPSY Right 07/13/2019   stereo biopsy/coil clip/ path pending  . BREAST RECONSTRUCTION WITH PLACEMENT OF TISSUE EXPANDER AND FLEX HD (ACELLULAR HYDRATED DERMIS) Bilateral 08/27/2019   Procedure: BILATERAL BREAST RECONSTRUCTION WITH PLACEMENT OF TISSUE EXPANDER AND FLEX HD (ACELLULAR HYDRATED DERMIS);  Surgeon: Wallace Going, DO;  Location: [facility];  Service: Plastics;  Laterality: Bilateral;  . [place], 2004, 2013  . COLONOSCOPY  2015  . CYSTOSCOPY W/ RETROGRADES Right 09/13/2018   Procedure: CYSTOSCOPY WITH RETROGRADE PYELOGRAM;  Surgeon: Abbie Sons, MD;  Location: [facility];  Service: Urology;  Laterality: Right;  . CYSTOSCOPY/URETEROSCOPY/HOLMIUM LASER/STENT PLACEMENT Left 05/09/2018    Procedure: CYSTOSCOPY/URETEROSCOPY/HOLMIUM LASER/STENT PLACEMENT;  Surgeon: Abbie Sons, MD;  Location: [facility];  Service: Urology;  Laterality: Left;  . CYSTOSCOPY/URETEROSCOPY/HOLMIUM LASER/STENT PLACEMENT Right 09/13/2018   Procedure: CYSTOSCOPY/URETEROSCOPY/HOLMIUM LASER/STENT PLACEMENT;  Surgeon: Abbie Sons, MD;  Location: [facility];  Service: Urology;  Laterality: Right;  . INCONTINENCE SURGERY     BLADDER TUCK  . PILONIDAL CYST EXCISION    . REMOVAL OF BILATERAL TISSUE EXPANDERS WITH PLACEMENT OF BILATERAL BREAST IMPLANTS Bilateral 01/02/2020   Procedure: REMOVAL OF BILATERAL TISSUE EXPANDERS WITH PLACEMENT OF BILATERAL BREAST IMPLANTS;  Surgeon: Wallace Going, DO;  Location: [facility];  Service: Plastics;  Laterality: Bilateral;  2 hours, please  . STONE EXTRACTION WITH BASKET Right 09/13/2018   Procedure: STONE EXTRACTION WITH BASKET;  Surgeon: Abbie Sons, MD;  Location: [facility];  Service: Urology;  Laterality: Right;  . TOTAL MASTECTOMY Bilateral 08/27/2019   Procedure: TOTAL MASTECTOMY;  Surgeon: Herbert Pun, MD;  Location: [facility];  Service: General;  Laterality: Bilateral;  START @930  DUE TO SENTINEL NODE  . UPPER GI ENDOSCOPY      There were no vitals filed for this visit.   Subjective Assessment - 02/19/20 1622    Subjective Patient reports minimal compliance with HEP d/t her children being home. She reports minimal pain, has had some popping that hurts when it happens but feels fine following.    Pertinent History Pt is  a 55 year old female s/p L proximal humerus fracture 12/02/10. PMH double masectomy 08/27/19 with expander placement and reconstruction finished 01/03/20.Pt fell in [place] walking straight forward onto her shoulder. Patient reports fracture was non-displaced and she did not have surgery, by Dr. Adrian Prince advisement, wore sling 6 weeks. Currently does not know orthopedic expectations for weight restrictions/movement  resistrictions, but has not been lifting more than 1/2 gallon of milk, but is no longer wearing sling. 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 Patient reports motion is very restricted and she is unable to don/doff clothes, her husband washes her hair for her, unable to push/pull, or lift anything. Pt is an avid golfer, and walks often. Worst shoulder pain over the past week 8/10 best 0/10 but reports she avoids painful movement.  Pt is R hand dominant. Pt denies N/V, B&B changes, unexplained weight fluctuation, saddle paresthesia, fever, night sweats, or unrelenting night pain at this time.    Limitations Lifting;House hold activities;Writing    How long can you sit comfortably? unlimited    How long can you stand comfortably? unlimited    How long can you walk comfortably? unlimited    Diagnostic tests Xrays [facility] 12/03/19    Patient Stated Goals Full ROM to complete self care    Pain Onset More than a month ago              Ther-Ex Pulleys abd x15; flex x15 with cuing for set up and posture with good carry over following Supine dowel flex AAROM x12 Supine active flex x12 Supine scaption <> protraction x12 Supine dowel abd AAROM x15 Sidelying active abd x12 Supinedowel ER <> IR AAROM x15 Sidelying ER x15 with towel under elbow doorway pec/bicep stretch x60sec Wall walk x5   Manual PROM all directions in pain free range            PT Education - 02/19/20 1629    Education Details therex form/technique    Person(s) Educated Patient    Methods Explanation;Demonstration;Verbal cues    Comprehension Verbalized understanding;Returned demonstration;Verbal cues required            PT Short Term Goals - 01/25/20 1002      PT SHORT TERM GOAL #1   Title Pt will be independent with HEP in order to improve strength and decrease pain in order to improve pain-free function at home and work.    Baseline 01/23/20  HEP given    Time 4    Period Weeks    Status New             PT Long Term Goals - 01/25/20 1003      PT LONG TERM GOAL #1   Title Pt will demonstrate full active L shoulder ROM in order to complete self care and overhead ADLS    Baseline 92/11/94 flex 39d abd 39d  ER L ear IR L PSIS    Time 8    Period Weeks    Status New      PT LONG TERM GOAL #2   Title Pt will decrease worst pain as reported on NPRS by at least 3 points in order to demonstrate clinically significant reduction in pain.    Baseline 01/25/20 8/10    Time 8    Period Weeks    Status New      PT LONG TERM GOAL #3   Title Pt will demonstrate L gross shoulder strength  of 5/5 in order to demonstrate PLOF and strength needed for heavy household tasks    Baseline 01/23/20 flex 2+ abd 2+ ER 2+ IR 2+    Time 8    Period Weeks    Status New      PT LONG TERM GOAL #4   Title Patient will increase FOTO score to 79 to demonstrate predicted increase in functional mobility to complete ADLs                 Plan - 02/19/20 1636    Clinical Impression Statement PT continued progression for increased shoulder mobility and strength with success. Patient isable to complete all therex with proper technique following cuing, and tolerate manual progression without increased pain. Pt is demonstrating increased AROM and understanding of prevention of gaurding to prevent muscle tension that restricts motion. PT will ocntinue progression as able.    Personal Factors and Comorbidities Comorbidity 1;Comorbidity 2;Comorbidity 3+;Past/Current Experience;Fitness;Time since onset of injury/illness/exacerbation    Comorbidities breast cancer, HTN, GERD, anxiety    Examination-Activity Limitations Bathing;Lift;Transfers;Reach Overhead;Carry;Dressing    Examination-Participation Restrictions Yard Work;Cleaning;Community Activity;Driving;Occupation;Laundry    Stability/Clinical Decision Making Evolving/Moderate complexity    Clinical  Decision Making Moderate    Rehab Potential Good    PT Frequency 2x / week    PT Duration 8 weeks    PT Treatment/Interventions ADLs/Self Care Home Management;Electrical Stimulation;Therapeutic exercise;Balance training;Joint Manipulations;Taping;Spinal Manipulations;Cryotherapy;Iontophoresis 18m/ml Dexamethasone;DME Instruction;Neuromuscular re-education;Gait training;Stair training;Functional mobility training;Moist Heat;Traction;Ultrasound;Therapeutic activities;Patient/family education;Manual techniques;Dry needling;Passive range of motion;Vestibular    PT Next Visit Plan PROM/AAROM, FOTO    PT Home Exercise Plan pulleys abd/flex    Consulted and Agree with Plan of Care Patient           Patient will benefit from skilled therapeutic intervention in order to improve the following deficits and impairments:  Decreased balance,Impaired flexibility,Impaired UE functional use,Hypomobility,Decreased strength,Decreased range of motion,Decreased endurance,Decreased activity tolerance,Postural dysfunction,Increased muscle spasms,Hypermobility,Decreased mobility,Decreased coordination,Abnormal gait,Pain,Improper body mechanics,Increased fascial restricitons  Visit Diagnosis: Acute pain of left shoulder  Stiffness of left shoulder, not elsewhere classified     Problem List Patient Active Problem List   Diagnosis Date Noted  . Sebaceous cyst 12/31/2019  . Breast wound 10/05/2019  . Acquired absence of bilateral breasts and nipples 09/04/2019  . Genetic testing 08/10/2019  . Monoallelic mutation of CHEK2 gene in female patient 08/07/2019  . Malignant neoplasm of upper-outer quadrant of right breast in female, estrogen receptor positive ([place] 07/30/2019  . Goals of care, counseling/discussion 07/30/2019  . Family history of prostate cancer   . Family history of colon cancer   . Breast cancer [place] 07/25/2019  . Left lower quadrant pain 05/23/2018  . Personal history of kidney stones  04/20/2018  . Microscopic hematuria 04/20/2018  . 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 Overweight (BMI 25.0-29.9) 01/22/2017  . Encounter for preventive health examination 06/30/2016  . Renal colic 086/76/1950 . Ureteral calculus 04/12/2015  . Nephrolithiasis 03/04/2015  . Intractable migraine with aura without status migrainosus 10/29/2013  . Lack of libido 06/24/2013  . Migraine syndrome 03/11/2013  . Generalized anxiety disorder 03/11/2013   CDurwin RegesDPT CDurwin Reges1/12/2020, 5:10 PM  [facility] [address] [place] [address]Phone: [phone]  Fax:  [phone] Name: Tiffany MacchiaMRN: [id number] of Birth: [birth year]

## 2020-02-21 ENCOUNTER — Ambulatory Visit: Payer: BC Managed Care – PPO | Admitting: Physical Therapy

## 2020-02-21 ENCOUNTER — Encounter: Payer: Self-pay | Admitting: Physical Therapy

## 2020-02-21 ENCOUNTER — Other Ambulatory Visit: Payer: Self-pay

## 2020-02-21 DIAGNOSIS — M25512 Pain in left shoulder: Secondary | ICD-10-CM

## 2020-02-21 DIAGNOSIS — Z9013 Acquired absence of bilateral breasts and nipples: Secondary | ICD-10-CM

## 2020-02-21 DIAGNOSIS — M25612 Stiffness of left shoulder, not elsewhere classified: Secondary | ICD-10-CM

## 2020-02-21 NOTE — Therapy (Signed)
[facility] [address] [place], [address] Phone: [phone]   Fax:  [phone]  Physical Therapy Treatment  Patient Details  Name: Tiffany Acevedo MRN: [id number] Date of Birth: 06/23/1965 No data recorded  Encounter Date: 02/21/2020   PT End of Session - 02/21/20 1628    Visit Number 7    Number of Visits 17    Date for PT Re-Evaluation 03/21/20    PT Start Time 0420    PT Stop Time 0500    PT Time Calculation (min) 40 min    Activity Tolerance Patient tolerated treatment well    Behavior During Therapy [facility] for tasks assessed/performed           Past Medical History:  Diagnosis Date  . Allergy   . Anxiety   . Breast cancer ([place])   . Depression   . Excessive weight gain 06/30/2016  . Family history of colon cancer   . Family history of colon cancer   . Family history of prostate cancer   . Frequent headaches   . GERD (gastroesophageal reflux disease)   . History of kidney stones   . Hypertension   . Migraines   . Pre-diabetes     Past Surgical History:  Procedure Laterality Date  . BREAST BIOPSY Right 07/13/2019   stereo biopsy/ x clip/path pending  . BREAST BIOPSY Right 07/13/2019   stereo biopsy/coil clip/ path pending  . BREAST RECONSTRUCTION WITH PLACEMENT OF TISSUE EXPANDER AND FLEX HD (ACELLULAR HYDRATED DERMIS) Bilateral 08/27/2019   Procedure: BILATERAL BREAST RECONSTRUCTION WITH PLACEMENT OF TISSUE EXPANDER AND FLEX HD (ACELLULAR HYDRATED DERMIS);  Surgeon: Wallace Going, DO;  Location: [facility];  Service: Plastics;  Laterality: Bilateral;  . [place], 2004, 2013  . COLONOSCOPY  2015  . CYSTOSCOPY W/ RETROGRADES Right 09/13/2018   Procedure: CYSTOSCOPY WITH RETROGRADE PYELOGRAM;  Surgeon: Abbie Sons, MD;  Location: [facility];  Service: Urology;  Laterality: Right;  . CYSTOSCOPY/URETEROSCOPY/HOLMIUM LASER/STENT PLACEMENT Left 05/09/2018    Procedure: CYSTOSCOPY/URETEROSCOPY/HOLMIUM LASER/STENT PLACEMENT;  Surgeon: Abbie Sons, MD;  Location: [facility];  Service: Urology;  Laterality: Left;  . CYSTOSCOPY/URETEROSCOPY/HOLMIUM LASER/STENT PLACEMENT Right 09/13/2018   Procedure: CYSTOSCOPY/URETEROSCOPY/HOLMIUM LASER/STENT PLACEMENT;  Surgeon: Abbie Sons, MD;  Location: [facility];  Service: Urology;  Laterality: Right;  . INCONTINENCE SURGERY     BLADDER TUCK  . PILONIDAL CYST EXCISION    . REMOVAL OF BILATERAL TISSUE EXPANDERS WITH PLACEMENT OF BILATERAL BREAST IMPLANTS Bilateral 01/02/2020   Procedure: REMOVAL OF BILATERAL TISSUE EXPANDERS WITH PLACEMENT OF BILATERAL BREAST IMPLANTS;  Surgeon: Wallace Going, DO;  Location: [place];  Service: Plastics;  Laterality: Bilateral;  2 hours, please  . STONE EXTRACTION WITH BASKET Right 09/13/2018   Procedure: STONE EXTRACTION WITH BASKET;  Surgeon: Abbie Sons, MD;  Location: [facility];  Service: Urology;  Laterality: Right;  . TOTAL MASTECTOMY Bilateral 08/27/2019   Procedure: TOTAL MASTECTOMY;  Surgeon: Herbert Pun, MD;  Location: [facility];  Service: General;  Laterality: Bilateral;  START @930  DUE TO SENTINEL NODE  . UPPER GI ENDOSCOPY      There were no vitals filed for this visit.   Subjective Assessment - 02/21/20 1622    Subjective Reports she is having some popping when she lowers her arm, otherwise doing well. Reports compliance with HEP, she is trying to make more time for them.    Pertinent History Pt is a  55 year old female s/p L proximal humerus fracture 12/02/10. PMH double masectomy 08/27/19 with expander placement and reconstruction finished 01/03/20.[place] straight forward onto her shoulder. Patient reports fracture was non-displaced and she did not have surgery, by Dr. Adrian Prince advisement, wore sling 6 weeks. Currently does not know orthopedic expectations for weight restrictions/movement resistrictions, but has not  been lifting more than 1/2 gallon of milk, but is no longer wearing sling. 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 Patient reports motion is very restricted and she is unable to don/doff clothes, her husband washes her hair for her, unable to push/pull, or lift anything. Pt is an avid golfer, and walks often. Worst shoulder pain over the past week 8/10 best 0/10 but reports she avoids painful movement.  Pt is R hand dominant. Pt denies N/V, B&B changes, unexplained weight fluctuation, saddle paresthesia, fever, night sweats, or unrelenting night pain at this time.    Limitations Lifting;House hold activities;Writing    How long can you sit comfortably? unlimited    Pain Onset More than a month ago               Ther-Ex Pulleys abd x15; flex x15 with cuing for set up and posture with good carry over following Supinedowel ER <> IR AAROM x15 Supine active flex 1# DB x12 Sidelying active abd x12 (attempted with 1# DB unable to maintain proper technique) Sidelying ER 1# DB x12with towel under elbow Standing rows 10# x10; 15# 2x 10  doorway pec/bicep stretch x60sec Wall walk x5 with small lift off at the top   Manual PROM all directions in pain free range            PT Short Term Goals - 01/25/20 1002      PT SHORT TERM GOAL #1   Title Pt will be independent with HEP in order to improve strength and decrease pain in order to improve pain-free function at home and work.    Baseline 01/23/20 HEP given    Time 4    Period Weeks    Status New             PT Long Term Goals - 01/25/20 1003      PT LONG TERM GOAL #1   Title Pt will demonstrate full active L shoulder ROM in order to complete self care and overhead ADLS    Baseline 46/50/35 flex 39d abd 39d  ER L ear IR L PSIS    Time 8    Period Weeks    Status New      PT LONG TERM GOAL #2   Title Pt will decrease worst pain as reported on NPRS by at least  3 points in order to demonstrate clinically significant reduction in pain.    Baseline 01/25/20 8/10    Time 8    Period Weeks    Status New      PT LONG TERM GOAL #3   Title Pt will demonstrate L gross shoulder strength of 5/5 in order to demonstrate PLOF and strength needed for heavy household tasks    Baseline 01/23/20 flex 2+ abd 2+ ER 2+ IR 2+    Time 8    Period Weeks    Status New      PT LONG TERM GOAL #4   Title Patient will increase FOTO score to 79 to demonstrate predicted increase in functional mobility to complete ADLs  Plan - 02/21/20 1644    Clinical Impression Statement PT continued progression for increased shoulder mobility and scapulohumeral strength and rhythm with success. Patient is motivated throughout session and able to comply with all cuing for proper technique of therex. Patient with minimally increased pain with some therex and end range PROM, ceases quickly. PT will continue progression as able.    Personal Factors and Comorbidities Comorbidity 1;Comorbidity 2;Comorbidity 3+;Past/Current Experience;Fitness;Time since onset of injury/illness/exacerbation    Comorbidities breast cancer, HTN, GERD, anxiety    Examination-Activity Limitations Bathing;Lift;Transfers;Reach Overhead;Carry;Dressing    Examination-Participation Restrictions Yard Work;Cleaning;Community Activity;Driving;Occupation;Laundry    Stability/Clinical Decision Making Evolving/Moderate complexity    Clinical Decision Making Moderate    Rehab Potential Good    PT Frequency 2x / week    PT Duration 8 weeks    PT Treatment/Interventions ADLs/Self Care Home Management;Electrical Stimulation;Therapeutic exercise;Balance training;Joint Manipulations;Taping;Spinal Manipulations;Cryotherapy;Iontophoresis 51m/ml Dexamethasone;DME Instruction;Neuromuscular re-education;Gait training;Stair training;Functional mobility training;Moist Heat;Traction;Ultrasound;Therapeutic  activities;Patient/family education;Manual techniques;Dry needling;Passive range of motion;Vestibular    PT Next Visit Plan PROM/AAROM, FOTO    PT Home Exercise Plan pulleys abd/flex    Consulted and Agree with Plan of Care Patient           Patient will benefit from skilled therapeutic intervention in order to improve the following deficits and impairments:  Decreased balance,Impaired flexibility,Impaired UE functional use,Hypomobility,Decreased strength,Decreased range of motion,Decreased endurance,Decreased activity tolerance,Postural dysfunction,Increased muscle spasms,Hypermobility,Decreased mobility,Decreased coordination,Abnormal gait,Pain,Improper body mechanics,Increased fascial restricitons  Visit Diagnosis: Acute pain of left shoulder  Stiffness of left shoulder, not elsewhere classified  Status post bilateral mastectomy     Problem List Patient Active Problem List   Diagnosis Date Noted  . Sebaceous cyst 12/31/2019  . Breast wound 10/05/2019  . Acquired absence of bilateral breasts and nipples 09/04/2019  . Genetic testing 08/10/2019  . Monoallelic mutation of CHEK2 gene in female patient 08/07/2019  . Malignant neoplasm of upper-outer quadrant of right breast in female, estrogen receptor positive ([place] 07/30/2019  . Goals of care, counseling/discussion 07/30/2019  . Family history of prostate cancer   . Family history of colon cancer   . [place] 07/25/2019  . Left lower quadrant pain 05/23/2018  . Personal history of kidney stones 04/20/2018  . Microscopic hematuria 04/20/2018  . 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 Overweight (BMI 25.0-29.9) 01/22/2017  . Encounter for preventive health examination 06/30/2016  . Renal colic 066/59/9357 . Ureteral calculus 04/12/2015  . Nephrolithiasis 03/04/2015  . Intractable migraine with aura without status migrainosus 10/29/2013  . Lack of libido 06/24/2013  . Migraine syndrome 03/11/2013  .  Generalized anxiety disorder 03/11/2013   CDurwin RegesDPT CDurwin Reges1/13/2022, 4:58 PM  [facility] [address] [place] [address]Phone: [phone]  Fax:  [phone] Name: TMargaretann AbateMRN: [id number] of Birth: 128-May-1967

## 2020-02-22 NOTE — Progress Notes (Signed)
I connected with Tiffany Acevedo on 02/22/20 at  2:45 PM EST by video enabled telemedicine visit and verified that I am speaking with the correct person using two identifiers.   I discussed the limitations, risks, security and privacy concerns of performing an evaluation and management service by telemedicine and the availability of in-person appointments. I also discussed with the patient that there may be a patient responsible charge related to this service. The patient expressed understanding and agreed to proceed.  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 This was followed by diagnostic mammogram which showed numerous groups of calcifications spanning at least 6.4 cm. She had a biopsy of the medialmost and lateralmost calcification. The medialmost calcification showed invasive mammary carcinoma grade 1 ER/PR positive and HER-2 negative. The lateralmost calcification was negative for atypia and malignancy and showed fibrocystic changes. Patient has met with Dr. Peyton Najjar and is leaning towards at least a unilateral mastectomy. She also underwent bilateral MRI which did not show any enhancement even at the site of biopsy-proven malignancy or elsewhere. No MRI evidence of malignancy in the left breast.  Patient is premenopausal and gets her menstrual cycles.  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 3 sentinel lymph nodes negative for  malignancy. No evidence of malignancy or DCIS in the left breast.  Interval history: Patient is having some persistent headaches when she was on tamoxifen.  When she came off tamoxifen briefly for her reconstruction procedure her headaches seem to improve.  She is now back on full dose tamoxifen for the last couple of weeks and so far has not had any issues   Review of Systems  Constitutional: Negative for chills, fever, malaise/fatigue and weight loss.  HENT: Negative for congestion, ear discharge and nosebleeds.   Eyes: Negative for blurred vision.  Respiratory: Negative for cough, hemoptysis, sputum production, shortness of breath and wheezing.   Cardiovascular: Negative for chest pain, palpitations, orthopnea and claudication.  Gastrointestinal: Negative for abdominal pain, blood in stool, constipation, diarrhea, heartburn, melena, nausea and vomiting.  Genitourinary: Negative for dysuria, flank pain, frequency, hematuria and urgency.  Musculoskeletal: Negative for back pain, joint pain and myalgias.  Skin: Negative for rash.  Neurological: Negative for dizziness, tingling, focal weakness, seizures, weakness and headaches.  Endo/Heme/Allergies: Does not bruise/bleed easily.  Psychiatric/Behavioral: Negative for depression and suicidal ideas. The patient does not have insomnia.     Allergies  Allergen Reactions  . Percocet [Oxycodone-Acetaminophen] Other (See Comments)    Arms and legs go numb  . Sulfa Antibiotics Nausea And Vomiting    Past Medical History:  Diagnosis Date  . Allergy   . Anxiety   . Breast cancer ([place])   . Depression   . Excessive weight gain 06/30/2016  . Family history of colon cancer   . Family history of colon cancer   . Family history of prostate cancer   . Frequent headaches   . GERD (gastroesophageal reflux disease)   . History of kidney stones   . Hypertension   .  Migraines   . Pre-diabetes     Past Surgical History:  Procedure Laterality  Date  . BREAST BIOPSY Right 07/13/2019   stereo biopsy/ x clip/path pending  . BREAST BIOPSY Right 07/13/2019   stereo biopsy/coil clip/ path pending  . BREAST RECONSTRUCTION WITH PLACEMENT OF TISSUE EXPANDER AND FLEX HD (ACELLULAR HYDRATED DERMIS) Bilateral 08/27/2019   Procedure: BILATERAL BREAST RECONSTRUCTION WITH PLACEMENT OF TISSUE EXPANDER AND FLEX HD (ACELLULAR HYDRATED DERMIS);  Surgeon: Wallace Going, DO;  Location: [facility];  Service: Plastics;  Laterality: Bilateral;  . [place], 2004, 2013  . COLONOSCOPY  2015  . CYSTOSCOPY W/ RETROGRADES Right 09/13/2018   Procedure: CYSTOSCOPY WITH RETROGRADE PYELOGRAM;  Surgeon: Abbie Sons, MD;  Location: [facility];  Service: Urology;  Laterality: Right;  . CYSTOSCOPY/URETEROSCOPY/HOLMIUM LASER/STENT PLACEMENT Left 05/09/2018   Procedure: CYSTOSCOPY/URETEROSCOPY/HOLMIUM LASER/STENT PLACEMENT;  Surgeon: Abbie Sons, MD;  Location: [facility];  Service: Urology;  Laterality: Left;  . CYSTOSCOPY/URETEROSCOPY/HOLMIUM LASER/STENT PLACEMENT Right 09/13/2018   Procedure: CYSTOSCOPY/URETEROSCOPY/HOLMIUM LASER/STENT PLACEMENT;  Surgeon: Abbie Sons, MD;  Location: [facility];  Service: Urology;  Laterality: Right;  . INCONTINENCE SURGERY     BLADDER TUCK  . PILONIDAL CYST EXCISION    . REMOVAL OF BILATERAL TISSUE EXPANDERS WITH PLACEMENT OF BILATERAL BREAST IMPLANTS Bilateral 01/02/2020   Procedure: REMOVAL OF BILATERAL TISSUE EXPANDERS WITH PLACEMENT OF BILATERAL BREAST IMPLANTS;  Surgeon: Wallace Going, DO;  Location: [place];  Service: Plastics;  Laterality: Bilateral;  2 hours, please  . STONE EXTRACTION WITH BASKET Right 09/13/2018   Procedure: STONE EXTRACTION WITH BASKET;  Surgeon: Abbie Sons, MD;  Location: [facility];  Service: Urology;  Laterality: Right;  . TOTAL MASTECTOMY Bilateral 08/27/2019   Procedure: TOTAL MASTECTOMY;  Surgeon: Herbert Pun, MD;  Location: [facility];  Service:  General;  Laterality: Bilateral;  START @930  DUE TO SENTINEL NODE  . UPPER GI ENDOSCOPY      Social History   Socioeconomic History  . Marital status: Married    Spouse name: Not on file  . Number of children: Not on file  . Years of education: Not on file  . Highest education level: Not on file  Occupational History  . Not on file  Tobacco Use  . Smoking status: Never Smoker  . Smokeless tobacco: Never Used  Vaping Use  . Vaping Use: Never used  Substance and Sexual Activity  . Alcohol use: Yes    Comment: glass of wine occassionally  . Drug use: No  . Sexual activity: Yes    Partners: Male  Other Topics Concern  . Not on file  Social History Narrative  . Not on file   Social Determinants of Health   Financial Resource Strain: Not on file  Food Insecurity: Not on file  Transportation Needs: Not on file  Physical Activity: Not on file  Stress: Not on file  Social Connections: Not on file  Intimate Partner Violence: Not on file    Family History  Problem Relation Age of Onset  . Hyperlipidemia Mother   . Hypertension Mother   . Depression Mother   . Colon cancer Mother 68  . Alcohol abuse Father   . Hyperlipidemia Father   . Stroke Father   . Hypertension Father   . Depression Father   . Heart disease Father   . Prostate cancer Father 9  . Arthritis Maternal Grandmother   . Hypertension Maternal Grandmother   . Depression Maternal Grandmother   .  Arthritis Maternal Grandfather   . Stroke Maternal Grandfather   . Hypertension Maternal Grandfather   . Depression Maternal Grandfather   . Hypertension Paternal Grandmother   . Depression Paternal Grandmother   . Hypertension Paternal Grandfather   . Depression Paternal Grandfather   . Leukemia Maternal Aunt        d <50  . Cancer Paternal Aunt        unsure types     Current Outpatient Medications:  .  Ascorbic Acid (VITAMIN C) 100 MG tablet, Take 500 mg by mouth daily., Disp: , Rfl:  .  Calcium  Carbonate-Vit D-Min (CALCIUM 1200 PO), Take 1 tablet by mouth daily. , Disp: , Rfl:  .  EMGALITY 120 MG/ML SOAJ, Inject 120 mg into the skin every 28 (twenty-eight) days., Disp: , Rfl:  .  hydrochlorothiazide (MICROZIDE) 12.5 MG capsule, Take 1 capsule (12.5 mg total) by mouth daily., Disp: 30 capsule, Rfl: 2 .  ibuprofen (ADVIL,MOTRIN) 200 MG tablet, Take 400 mg by mouth every 6 (six) hours as needed for headache or moderate pain. , Disp: , Rfl:  .  Ibuprofen-diphenhydrAMINE Cit (MOTRIN PM PO), Take 1 tablet by mouth at bedtime., Disp: , Rfl:  .  imipramine (TOFRANIL) 50 MG tablet, Take 50 mg by mouth at bedtime. , Disp: , Rfl:  .  metoprolol succinate (TOPROL-XL) 100 MG 24 hr tablet, Take 25 mg by mouth daily., Disp: , Rfl:  .  Multiple Vitamin (MULTIVITAMIN PO), Take 1 tablet by mouth daily., Disp: , Rfl:  .  omeprazole (PRILOSEC) 20 MG capsule, Take 20 mg by mouth daily. , Disp: , Rfl:  .  rizatriptan (MAXALT) 10 MG tablet, Take 10 mg by mouth as needed for migraine. , Disp: , Rfl:  .  tamoxifen (NOLVADEX) 20 MG tablet, TAKE 1 TABLET BY MOUTH DAILY., Disp: 30 tablet, Rfl: 2 .  venlafaxine XR (EFFEXOR-XR) 150 MG 24 hr capsule, TAKE 1 CAPSULE BY MOUTH EVERY DAY WITH BREAKFAST (Patient taking differently: Take 150 mg by mouth daily with breakfast.), Disp: 30 capsule, Rfl: 5 .  Zinc 22.5 MG TABS, Take 1 tablet by mouth daily., Disp: , Rfl:  .  HYDROcodone-acetaminophen (NORCO/VICODIN) 5-325 MG tablet, Take 1 tablet by mouth every 6 (six) hours as needed.  (Patient not taking: Reported on 02/19/2020), Disp: , Rfl:  .  ondansetron (ZOFRAN) 4 MG tablet, Take 1 tablet (4 mg total) by mouth every 8 (eight) hours as needed for nausea or vomiting. (Patient not taking: Reported on 02/19/2020), Disp: 20 tablet, Rfl: 0  No results found.  No images are attached to the encounter.   CMP Latest Ref Rng & Units 12/31/2019  Glucose 70 - 99 mg/dL 85  BUN 6 - 20 mg/dL 20  Creatinine 0.44 - 1.00 mg/dL 0.81   Sodium 135 - 145 mmol/L 134(L)  Potassium 3.5 - 5.1 mmol/L 3.9  Chloride 98 - 111 mmol/L 101  CO2 22 - 32 mmol/L 24  Calcium 8.9 - 10.3 mg/dL 9.1  Total Protein 6.5 - 8.1 g/dL -  Total Bilirubin 0.3 - 1.2 mg/dL -  Alkaline Phos 38 - 126 U/L -  AST 15 - 41 U/L -  ALT 0 - 44 U/L -   CBC Latest Ref Rng & Units 09/14/2018  WBC 4.0 - 10.5 K/uL 17.7(H)  Hemoglobin 12.0 - 15.0 g/dL 12.8  Hematocrit 36.0 - 46.0 % 38.9  Platelets 150 - 400 K/uL 257     Observation/objective: Appears in no acute distress over  video visit today.  Breathing is nonlabored  Assessment and plan: Patient is a 55 year old female with CHEK2 mutation and pathological prognostic stage Ia invasive mammary carcinoma of the right breast ER/PR positive HER2 negative pT1 apN0 cM0 status post bilateral mastectomy.  She is on tamoxifen and this is a routine follow-up visit  Patient restarted tamoxifen about 2 to 3 weeks ago and her headaches have not returned so far.  We discussed that if her headaches return and it is affecting her quality of life we could consider reducing the dose of tamoxifen to 10 mg daily.  She did have a small 3 mm ER positive breast cancer and since she is s/p bilateral mastectomy the benefit of tamoxifen is probably not as significant but still exists.  It would still be worth it for her to take tamoxifen to reduce the incidence of breast cancer recurrence outside the breast.  Patient verbalized understanding.  She will reach out to [place] if she has any concerns with the present dose of tamoxifen  Follow-up instructions:  I discussed the assessment and treatment plan with the patient. The patient was provided an opportunity to ask questions and all were answered. The patient agreed with the plan and demonstrated an understanding of the instructions.   The patient was advised to call back or seek an in-person evaluation if the symptoms worsen or if the condition fails to improve as anticipated.   Visit  Diagnosis: 1. Encounter for monitoring tamoxifen therapy   2. Ductal carcinoma in situ (DCIS) of right breast     Dr. Randa Evens, MD, MPH [facility] Tel- [phone] 02/22/2020 8:49 AM

## 2020-02-26 ENCOUNTER — Ambulatory Visit: Payer: BC Managed Care – PPO | Admitting: Physical Therapy

## 2020-02-26 ENCOUNTER — Other Ambulatory Visit: Payer: Self-pay

## 2020-02-26 ENCOUNTER — Encounter: Payer: Self-pay | Admitting: Physical Therapy

## 2020-02-26 DIAGNOSIS — M25512 Pain in left shoulder: Secondary | ICD-10-CM

## 2020-02-26 DIAGNOSIS — M25612 Stiffness of left shoulder, not elsewhere classified: Secondary | ICD-10-CM

## 2020-02-26 NOTE — Therapy (Signed)
[facility] [address] [place], [address] Phone: [phone]   Fax:  [phone]  Physical Therapy Treatment  Patient Details  Name: Tiffany Acevedo MRN: [id number] Date of Birth: October 06, 1965 No data recorded  Encounter Date: 02/26/2020   PT End of Session - 02/26/20 1532    Visit Number 8    Number of Visits 17    Date for PT Re-Evaluation 03/21/20    PT Start Time 0319    PT Stop Time 0400    PT Time Calculation (min) 41 min    Activity Tolerance Patient tolerated treatment well    Behavior During Therapy [facility] for tasks assessed/performed           Past Medical History:  Diagnosis Date  . Allergy   . Anxiety   . Breast cancer ([place])   . Depression   . Excessive weight gain 06/30/2016  . Family history of colon cancer   . Family history of colon cancer   . Family history of prostate cancer   . Frequent headaches   . GERD (gastroesophageal reflux disease)   . History of kidney stones   . Hypertension   . Migraines   . Pre-diabetes     Past Surgical History:  Procedure Laterality Date  . BREAST BIOPSY Right 07/13/2019   stereo biopsy/ x clip/path pending  . BREAST BIOPSY Right 07/13/2019   stereo biopsy/coil clip/ path pending  . BREAST RECONSTRUCTION WITH PLACEMENT OF TISSUE EXPANDER AND FLEX HD (ACELLULAR HYDRATED DERMIS) Bilateral 08/27/2019   Procedure: BILATERAL BREAST RECONSTRUCTION WITH PLACEMENT OF TISSUE EXPANDER AND FLEX HD (ACELLULAR HYDRATED DERMIS);  Surgeon: Wallace Going, DO;  Location: [facility];  Service: Plastics;  Laterality: Bilateral;  . [place], 2004, 2013  . COLONOSCOPY  2015  . CYSTOSCOPY W/ RETROGRADES Right 09/13/2018   Procedure: CYSTOSCOPY WITH RETROGRADE PYELOGRAM;  Surgeon: Abbie Sons, MD;  Location: [facility];  Service: Urology;  Laterality: Right;  . CYSTOSCOPY/URETEROSCOPY/HOLMIUM LASER/STENT PLACEMENT Left 05/09/2018    Procedure: CYSTOSCOPY/URETEROSCOPY/HOLMIUM LASER/STENT PLACEMENT;  Surgeon: Abbie Sons, MD;  Location: [facility];  Service: Urology;  Laterality: Left;  . CYSTOSCOPY/URETEROSCOPY/HOLMIUM LASER/STENT PLACEMENT Right 09/13/2018   Procedure: CYSTOSCOPY/URETEROSCOPY/HOLMIUM LASER/STENT PLACEMENT;  Surgeon: Abbie Sons, MD;  Location: [facility];  Service: Urology;  Laterality: Right;  . INCONTINENCE SURGERY     BLADDER TUCK  . PILONIDAL CYST EXCISION    . REMOVAL OF BILATERAL TISSUE EXPANDERS WITH PLACEMENT OF BILATERAL BREAST IMPLANTS Bilateral 01/02/2020   Procedure: REMOVAL OF BILATERAL TISSUE EXPANDERS WITH PLACEMENT OF BILATERAL BREAST IMPLANTS;  Surgeon: Wallace Going, DO;  Location: [facility];  Service: Plastics;  Laterality: Bilateral;  2 hours, please  . STONE EXTRACTION WITH BASKET Right 09/13/2018   Procedure: STONE EXTRACTION WITH BASKET;  Surgeon: Abbie Sons, MD;  Location: [facility];  Service: Urology;  Laterality: Right;  . TOTAL MASTECTOMY Bilateral 08/27/2019   Procedure: TOTAL MASTECTOMY;  Surgeon: Herbert Pun, MD;  Location: [facility];  Service: General;  Laterality: Bilateral;  START @930  DUE TO SENTINEL NODE  . UPPER GI ENDOSCOPY      There were no vitals filed for this visit.   Subjective Assessment - 02/26/20 1521    Subjective Reports she is completing her HEP, still with som popping. Trying to move her UE more. Reports 2/10 pain today.    Pertinent History Pt is a 55 year old female s/p L proximal  humerus fracture 12/02/10. PMH double masectomy 08/27/19 with expander placement and reconstruction finished 01/03/20.Pt fell in [place] walking straight forward onto her shoulder. Patient reports fracture was non-displaced and she did not have surgery, by Dr. Adrian Prince advisement, wore sling 6 weeks. Currently does not know orthopedic expectations for weight restrictions/movement resistrictions, but has not been lifting more than 1/2 gallon of  milk, but is no longer wearing sling. 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 Patient reports motion is very restricted and she is unable to don/doff clothes, her husband washes her hair for her, unable to push/pull, or lift anything. Pt is an avid golfer, and walks often. Worst shoulder pain over the past week 8/10 best 0/10 but reports she avoids painful movement.  Pt is R hand dominant. Pt denies N/V, B&B changes, unexplained weight fluctuation, saddle paresthesia, fever, night sweats, or unrelenting night pain at this time.    Limitations Lifting;House hold activities;Writing    How long can you sit comfortably? unlimited    How long can you stand comfortably? unlimited    How long can you walk comfortably? unlimited    Diagnostic tests Xrays [facility] 12/03/19    Patient Stated Goals Full ROM to complete self care    Pain Onset More than a month ago           Ther-Ex Pulleys abd x15; flex x15 with cuing for set up and posture with good carry over following Seated with 1/2 foam roll at tspine dowel LUE AROM + with RUE AAROM once unable to continue LUE AROM x12 (AROM approx 95d; AAROM 130d) Same set up abd x12 Sidelying L abd x10; with 1# DB Sidelying ER 1# DB x12; 2# x10with towel under elbow Seated flex long lever x10 with some pain following; short lever x10  Standing rows 10# x10; 15# 2x 10  doorway pec/bicep stretch x60sec Wall walk x6 with small lift off at the top   Manual PROM all directions in pain free range       PT Education - 02/26/20 1531    Education Details therex form/technique    Person(s) Educated Patient    Methods Explanation;Demonstration;Verbal cues    Comprehension Verbalized understanding;Returned demonstration;Verbal cues required            PT Short Term Goals - 01/25/20 1002      PT SHORT TERM GOAL #1   Title Pt will be independent with HEP in order to improve strength and  decrease pain in order to improve pain-free function at home and work.    Baseline 01/23/20 HEP given    Time 4    Period Weeks    Status New             PT Long Term Goals - 01/25/20 1003      PT LONG TERM GOAL #1   Title Pt will demonstrate full active L shoulder ROM in order to complete self care and overhead ADLS    Baseline 69/67/89 flex 39d abd 39d  ER L ear IR L PSIS    Time 8    Period Weeks    Status New      PT LONG TERM GOAL #2   Title Pt will decrease worst pain as reported on NPRS by at least 3 points in order to demonstrate clinically significant reduction in pain.    Baseline 01/25/20 8/10    Time 8    Period Weeks  Status New      PT LONG TERM GOAL #3   Title Pt will demonstrate L gross shoulder strength of 5/5 in order to demonstrate PLOF and strength needed for heavy household tasks    Baseline 01/23/20 flex 2+ abd 2+ ER 2+ IR 2+    Time 8    Period Weeks    Status New      PT LONG TERM GOAL #4   Title Patient will increase FOTO score to 79 to demonstrate predicted increase in functional mobility to complete ADLs                 Plan - 02/26/20 1538    Clinical Impression Statement PT continued therex progression for increased shoulder mobility and scapulohumeral rhythm with increased anti-gravity positions with success. Patient with some increased pain through overhead therex, with quick resolution. Pt is able to comply with all cuing for proper technique of therex with good motivation throughout session. PT will continue progression as able.    Personal Factors and Comorbidities Comorbidity 1;Comorbidity 2;Comorbidity 3+;Past/Current Experience;Fitness;Time since onset of injury/illness/exacerbation    Comorbidities breast cancer, HTN, GERD, anxiety    Examination-Activity Limitations Bathing;Lift;Transfers;Reach Overhead;Carry;Dressing    Stability/Clinical Decision Making Evolving/Moderate complexity    Clinical Decision Making Moderate     Rehab Potential Good    PT Frequency 2x / week    PT Duration 8 weeks    PT Treatment/Interventions ADLs/Self Care Home Management;Electrical Stimulation;Therapeutic exercise;Balance training;Joint Manipulations;Taping;Spinal Manipulations;Cryotherapy;Iontophoresis 68m/ml Dexamethasone;DME Instruction;Neuromuscular re-education;Gait training;Stair training;Functional mobility training;Moist Heat;Traction;Ultrasound;Therapeutic activities;Patient/family education;Manual techniques;Dry needling;Passive range of motion;Vestibular    PT Next Visit Plan PROM/AAROM, FOTO    PT Home Exercise Plan pulleys abd/flex    Consulted and Agree with Plan of Care Patient           Patient will benefit from skilled therapeutic intervention in order to improve the following deficits and impairments:  Decreased balance,Impaired flexibility,Impaired UE functional use,Hypomobility,Decreased strength,Decreased range of motion,Decreased endurance,Decreased activity tolerance,Postural dysfunction,Increased muscle spasms,Hypermobility,Decreased mobility,Decreased coordination,Abnormal gait,Pain,Improper body mechanics,Increased fascial restricitons  Visit Diagnosis: Acute pain of left shoulder  Stiffness of left shoulder, not elsewhere classified     Problem List Patient Active Problem List   Diagnosis Date Noted  . Sebaceous cyst 12/31/2019  . Breast wound 10/05/2019  . Acquired absence of bilateral breasts and nipples 09/04/2019  . Genetic testing 08/10/2019  . Monoallelic mutation of CHEK2 gene in female patient 08/07/2019  . Malignant neoplasm of upper-outer quadrant of right breast in female, estrogen receptor positive ([place] 07/30/2019  . Goals of care, counseling/discussion 07/30/2019  . Family history of prostate cancer   . Family history of colon cancer   . [place] 07/25/2019  . Left lower quadrant pain 05/23/2018  . Personal history of kidney stones 04/20/2018  . Microscopic hematuria  04/20/2018  . 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 Overweight (BMI 25.0-29.9) 01/22/2017  . Encounter for preventive health examination 06/30/2016  . Renal colic 001/10/3233 . Ureteral calculus 04/12/2015  . Nephrolithiasis 03/04/2015  . Intractable migraine with aura without status migrainosus 10/29/2013  . Lack of libido 06/24/2013  . Migraine syndrome 03/11/2013  . Generalized anxiety disorder 03/11/2013   CDurwin RegesDPT CDurwin Reges1/18/2022, 4:03 PM  [facility] [address] [place] [address]Phone: [phone]  Fax:  [phone] Name: TKarrisa DidioMRN: [id number] of Birth: 111/11/1965

## 2020-02-29 ENCOUNTER — Ambulatory Visit: Payer: BC Managed Care – PPO | Admitting: Physical Therapy

## 2020-03-04 ENCOUNTER — Other Ambulatory Visit: Payer: Self-pay

## 2020-03-04 ENCOUNTER — Encounter: Payer: Self-pay | Admitting: Physical Therapy

## 2020-03-04 ENCOUNTER — Ambulatory Visit: Payer: BC Managed Care – PPO | Admitting: Physical Therapy

## 2020-03-04 DIAGNOSIS — M25512 Pain in left shoulder: Secondary | ICD-10-CM

## 2020-03-04 DIAGNOSIS — M25612 Stiffness of left shoulder, not elsewhere classified: Secondary | ICD-10-CM

## 2020-03-04 NOTE — Therapy (Signed)
[facility] [facility] [address], [place], [address] Phone: [phone]   Fax:  [phone]  Physical Therapy Treatment  Patient Details  Name: Tiffany Acevedo MRN: [id number] Date of Birth: 06-Oct-1965 No data recorded  Encounter Date: 03/04/2020   PT End of Session - 03/04/20 1708    Visit Number 9    Number of Visits 17    Date for PT Re-Evaluation 03/21/20    Authorization - Visit Number 9    Authorization - Number of Visits 17    PT Start Time 4944    PT Stop Time 1742    PT Time Calculation (min) 40 min    Activity Tolerance Patient tolerated treatment well    Behavior During Therapy [facility] for tasks assessed/performed           Past Medical History:  Diagnosis Date  . Allergy   . Anxiety   . Breast cancer ([place])   . Depression   . Excessive weight gain 06/30/2016  . Family history of colon cancer   . Family history of colon cancer   . Family history of prostate cancer   . Frequent headaches   . GERD (gastroesophageal reflux disease)   . History of kidney stones   . Hypertension   . Migraines   . Pre-diabetes     Past Surgical History:  Procedure Laterality Date  . BREAST BIOPSY Right 07/13/2019   stereo biopsy/ x clip/path pending  . BREAST BIOPSY Right 07/13/2019   stereo biopsy/coil clip/ path pending  . BREAST RECONSTRUCTION WITH PLACEMENT OF TISSUE EXPANDER AND FLEX HD (ACELLULAR HYDRATED DERMIS) Bilateral 08/27/2019   Procedure: BILATERAL BREAST RECONSTRUCTION WITH PLACEMENT OF TISSUE EXPANDER AND FLEX HD (ACELLULAR HYDRATED DERMIS);  Surgeon: Wallace Going, DO;  Location: [facility];  Service: Plastics;  Laterality: Bilateral;  . [place], 2004, 2013  . COLONOSCOPY  2015  . CYSTOSCOPY W/ RETROGRADES Right 09/13/2018   Procedure: CYSTOSCOPY WITH RETROGRADE PYELOGRAM;  Surgeon: Abbie Sons, MD;  Location: [facility];  Service: Urology;  Laterality: Right;  .  CYSTOSCOPY/URETEROSCOPY/HOLMIUM LASER/STENT PLACEMENT Left 05/09/2018   Procedure: CYSTOSCOPY/URETEROSCOPY/HOLMIUM LASER/STENT PLACEMENT;  Surgeon: Abbie Sons, MD;  Location: [facility];  Service: Urology;  Laterality: Left;  . CYSTOSCOPY/URETEROSCOPY/HOLMIUM LASER/STENT PLACEMENT Right 09/13/2018   Procedure: CYSTOSCOPY/URETEROSCOPY/HOLMIUM LASER/STENT PLACEMENT;  Surgeon: Abbie Sons, MD;  Location: [facility];  Service: Urology;  Laterality: Right;  . INCONTINENCE SURGERY     BLADDER TUCK  . PILONIDAL CYST EXCISION    . REMOVAL OF BILATERAL TISSUE EXPANDERS WITH PLACEMENT OF BILATERAL BREAST IMPLANTS Bilateral 01/02/2020   Procedure: REMOVAL OF BILATERAL TISSUE EXPANDERS WITH PLACEMENT OF BILATERAL BREAST IMPLANTS;  Surgeon: Wallace Going, DO;  Location: [facility];  Service: Plastics;  Laterality: Bilateral;  2 hours, please  . STONE EXTRACTION WITH BASKET Right 09/13/2018   Procedure: STONE EXTRACTION WITH BASKET;  Surgeon: Abbie Sons, MD;  Location: [facility];  Service: Urology;  Laterality: Right;  . TOTAL MASTECTOMY Bilateral 08/27/2019   Procedure: TOTAL MASTECTOMY;  Surgeon: Herbert Pun, MD;  Location: [facility];  Service: General;  Laterality: Bilateral;  START _0  DUE TO SENTINEL NODE  . UPPER GI ENDOSCOPY      There were no vitals filed for this visit.   Subjective Assessment - 03/04/20 1704    Subjective Reports some pain today 5/10 and still has been moving UE as much as possible.  Still completing  HEP.    Pertinent History Pt is a 55 year old female s/p L proximal humerus fracture 12/02/10. PMH double masectomy 08/27/19 with expander placement and reconstruction finished 01/03/20.Pt fell in [place] walking straight forward onto her shoulder. Patient reports fracture was non-displaced and she did not have surgery, by Dr. Adrian Prince advisement, wore sling 6 weeks. Currently does not know orthopedic expectations for weight restrictions/movement  resistrictions, but has not been lifting more than 1/2 gallon of milk, but is no longer wearing sling. 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 Patient reports motion is very restricted and she is unable to don/doff clothes, her husband washes her hair for her, unable to push/pull, or lift anything. Pt is an avid golfer, and walks often. Worst shoulder pain over the past week 8/10 best 0/10 but reports she avoids painful movement.  Pt is R hand dominant. Pt denies N/V, B&B changes, unexplained weight fluctuation, saddle paresthesia, fever, night sweats, or unrelenting night pain at this time.    Limitations Lifting;House hold activities;Writing    How long can you sit comfortably? unlimited    How long can you stand comfortably? unlimited    How long can you walk comfortably? unlimited    Diagnostic tests Xrays [facility] 12/03/19    Patient Stated Goals Full ROM to complete self care    Pain Onset More than a month ago              Ther-Ex Pulleys abd x15; flex x15 with cuing for set up and posture with good carry over following Seated with 1/2 foam roll at tspine dowel LUE AROM + with RUE AAROM once unable to continue LUE AROM 2 x12 (AROM approx 95d; AAROM 130d) Same set up abd 2 x12 Seated flex short lever x10 ; with 1# DB x8  Seated OMEGA press 5# 2x 10/8 with min cuing to prevent shoulder hiking Standing rows 15# 2x 10/8with good carry over following min cuing for set up doorway pec/bicep stretch x60sec   Manual PROM all directions in pain free range                        PT Education - 03/04/20 1707    Education Details therex form/technique    Person(s) Educated Patient    Methods Explanation;Demonstration;Verbal cues    Comprehension Verbalized understanding;Returned demonstration;Verbal cues required            PT Short Term Goals - 01/25/20 1002      PT SHORT TERM GOAL #1   Title Pt  will be independent with HEP in order to improve strength and decrease pain in order to improve pain-free function at home and work.    Baseline 01/23/20 HEP given    Time 4    Period Weeks    Status New             PT Long Term Goals - 01/25/20 1003      PT LONG TERM GOAL #1   Title Pt will demonstrate full active L shoulder ROM in order to complete self care and overhead ADLS    Baseline 19/14/78 flex 39d abd 39d  ER L ear IR L PSIS    Time 8    Period Weeks    Status New      PT LONG TERM GOAL #2   Title Pt will decrease worst pain as reported on NPRS by at least  3 points in order to demonstrate clinically significant reduction in pain.    Baseline 01/25/20 8/10    Time 8    Period Weeks    Status New      PT LONG TERM GOAL #3   Title Pt will demonstrate L gross shoulder strength of 5/5 in order to demonstrate PLOF and strength needed for heavy household tasks    Baseline 01/23/20 flex 2+ abd 2+ ER 2+ IR 2+    Time 8    Period Weeks    Status New      PT LONG TERM GOAL #4   Title Patient will increase FOTO score to 79 to demonstrate predicted increase in functional mobility to complete ADLs                 Plan - 03/04/20 1709    Clinical Impression Statement Continued therex progression for increased shoulder mobility for scapulohumeral rhythm continuing to increase motions in anti gravity positions. pt was able to complete all therex with proper technique following multimodal cuing. Patient describes increased muscular pain today, with increased tension of UT. PT advised heat application to help with this, and prevention of shoulder hiking and gaurding. PT will continue progression as able.    Personal Factors and Comorbidities Comorbidity 1;Comorbidity 2;Comorbidity 3+;Past/Current Experience;Fitness;Time since onset of injury/illness/exacerbation    Examination-Activity Limitations Bathing;Lift;Transfers;Reach Overhead;Carry;Dressing     Examination-Participation Restrictions Yard Work;Cleaning;Community Activity;Driving;Occupation;Laundry    Stability/Clinical Decision Making Evolving/Moderate complexity    Clinical Decision Making Moderate    Rehab Potential Poor    PT Frequency 2x / week    PT Duration 8 weeks    PT Treatment/Interventions ADLs/Self Care Home Management;Electrical Stimulation;Therapeutic exercise;Balance training;Joint Manipulations;Taping;Spinal Manipulations;Cryotherapy;Iontophoresis 17m/ml Dexamethasone;DME Instruction;Neuromuscular re-education;Gait training;Stair training;Functional mobility training;Moist Heat;Traction;Ultrasound;Therapeutic activities;Patient/family education;Manual techniques;Dry needling;Passive range of motion;Vestibular    PT Next Visit Plan PROM/AAROM, FOTO    PT Home Exercise Plan pulleys abd/flex    Consulted and Agree with Plan of Care Patient           Patient will benefit from skilled therapeutic intervention in order to improve the following deficits and impairments:  Decreased balance,Impaired flexibility,Impaired UE functional use,Hypomobility,Decreased strength,Decreased range of motion,Decreased endurance,Decreased activity tolerance,Postural dysfunction,Increased muscle spasms,Hypermobility,Decreased mobility,Decreased coordination,Abnormal gait,Pain,Improper body mechanics,Increased fascial restricitons  Visit Diagnosis: Acute pain of left shoulder  Stiffness of left shoulder, not elsewhere classified     Problem List Patient Active Problem List   Diagnosis Date Noted  . Sebaceous cyst 12/31/2019  . Breast wound 10/05/2019  . Acquired absence of bilateral breasts and nipples 09/04/2019  . Genetic testing 08/10/2019  . Monoallelic mutation of CHEK2 gene in female patient 08/07/2019  . Malignant neoplasm of upper-outer quadrant of right breast in female, estrogen receptor positive ([place] 07/30/2019  . Goals of care, counseling/discussion 07/30/2019  . Family  history of prostate cancer   . Family history of colon cancer   . Breast cancer ([place] 07/25/2019  . Left lower quadrant pain 05/23/2018  . Personal history of kidney stones 04/20/2018  . Microscopic hematuria 04/20/2018  . 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 Overweight (BMI 25.0-29.9) 01/22/2017  . Encounter for preventive health examination 06/30/2016  . Renal colic 001/75/1025 . Ureteral calculus 04/12/2015  . Nephrolithiasis 03/04/2015  . Intractable migraine with aura without status migrainosus 10/29/2013  . Lack of libido 06/24/2013  . Migraine syndrome 03/11/2013  . Generalized anxiety disorder 03/11/2013   CDurwin RegesDPT [facility]wed[facility]1/25/2022, 5:48  PM  [place] [address], [place], [address] Phone: [phone]   Fax:  [phone]  Name: Tiffany Acevedo MRN: [id number] Date of Birth: September 09, 1965

## 2020-03-06 ENCOUNTER — Ambulatory Visit: Payer: BC Managed Care – PPO | Admitting: Physical Therapy

## 2020-03-06 DIAGNOSIS — Z9889 Other specified postprocedural states: Secondary | ICD-10-CM | POA: Insufficient documentation

## 2020-03-06 NOTE — Progress Notes (Signed)
Patient is a 55 year old female here for follow-up after undergoing bilateral exchange of tissue expanders for implants and capsulectomies for repositioning on 01/02/2020 with Dr. Marla Roe. At last visit on 12/29, bilateral incisions were healing nicely. She had a little more fullness on the right breast superior lateral aspect than on the left; may consider fat grafting for improved symmetry if this does not improve.  ~ 9 weeks PO Patient reports she is doing well. Denies Fever/chills, nausea/vomiting. Reports PT is going well but she is not progressing as quickly as she hoped. Left arm that she broke still has limited ROM and she still has some limits on her right arm since the mastectomy. Bilateral incisions have healed well, c/d/i. No signs of infection, redness, drainage, seroma. She lacks bilateral upper pole fullness, left > right and some medial fullness. She is interested in fat grafting.   She also has overhang of the pannus that she is not able to resolve with diet and exercise. This causes discomfort and difficulty with clothing.  She is interested in exploring possible panniculectomy or abdominoplasty. She would like to have this at the same time she does the fat grafting. 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 Call office with any questions/concerns.  Pictures were obtained of the patient and placed in the chart with the patient's or guardian's permission.

## 2020-03-07 ENCOUNTER — Other Ambulatory Visit: Payer: Self-pay

## 2020-03-07 ENCOUNTER — Encounter: Payer: Self-pay | Admitting: Plastic Surgery

## 2020-03-07 ENCOUNTER — Ambulatory Visit (INDEPENDENT_AMBULATORY_CARE_PROVIDER_SITE_OTHER): Payer: BC Managed Care – PPO | Admitting: Plastic Surgery

## 2020-03-07 VITALS — BP 134/88 | HR 69 | Temp 98.3°F | Ht 64.0 in | Wt 155.0 lb

## 2020-03-07 DIAGNOSIS — E65 Localized adiposity: Secondary | ICD-10-CM

## 2020-03-07 DIAGNOSIS — Z9889 Other specified postprocedural states: Secondary | ICD-10-CM

## 2020-03-10 ENCOUNTER — Ambulatory Visit: Payer: BC Managed Care – PPO | Admitting: Physical Therapy

## 2020-03-10 ENCOUNTER — Encounter: Payer: Self-pay | Admitting: Physical Therapy

## 2020-03-10 ENCOUNTER — Other Ambulatory Visit: Payer: Self-pay

## 2020-03-10 DIAGNOSIS — M25512 Pain in left shoulder: Secondary | ICD-10-CM | POA: Diagnosis not present

## 2020-03-10 DIAGNOSIS — M25612 Stiffness of left shoulder, not elsewhere classified: Secondary | ICD-10-CM

## 2020-03-10 NOTE — Therapy (Signed)
[facility] [address] [place], [address] Phone: [phone]   Fax:  [phone]  Physical Therapy Treatment  Patient Details  Name: Tiffany Acevedo MRN: [id number] Date of Birth: 03-08-1965 No data recorded  Encounter Date: 03/10/2020   PT End of Session - 03/10/20 0754    Visit Number 10    Number of Visits 17    Date for PT Re-Evaluation 03/21/20    Authorization - Visit Number 10    Authorization - Number of Visits 17    PT Start Time 0734    PT Stop Time 0815    PT Time Calculation (min) 41 min    Activity Tolerance Patient tolerated treatment well    Behavior During Therapy [facility] for tasks assessed/performed           Past Medical History:  Diagnosis Date  . Allergy   . Anxiety   . Breast cancer ([place])   . Depression   . Excessive weight gain 06/30/2016  . Family history of colon cancer   . Family history of colon cancer   . Family history of prostate cancer   . Frequent headaches   . GERD (gastroesophageal reflux disease)   . History of kidney stones   . Hypertension   . Migraines   . Pre-diabetes     Past Surgical History:  Procedure Laterality Date  . BREAST BIOPSY Right 07/13/2019   stereo biopsy/ x clip/path pending  . BREAST BIOPSY Right 07/13/2019   stereo biopsy/coil clip/ path pending  . BREAST RECONSTRUCTION WITH PLACEMENT OF TISSUE EXPANDER AND FLEX HD (ACELLULAR HYDRATED DERMIS) Bilateral 08/27/2019   Procedure: BILATERAL BREAST RECONSTRUCTION WITH PLACEMENT OF TISSUE EXPANDER AND FLEX HD (ACELLULAR HYDRATED DERMIS);  Surgeon: Wallace Going, DO;  Location: [facility];  Service: Plastics;  Laterality: Bilateral;  . [place], 2004, 2013  . COLONOSCOPY  2015  . CYSTOSCOPY W/ RETROGRADES Right 09/13/2018   Procedure: CYSTOSCOPY WITH RETROGRADE PYELOGRAM;  Surgeon: Abbie Sons, MD;  Location: [facility];  Service: Urology;  Laterality: Right;  .  CYSTOSCOPY/URETEROSCOPY/HOLMIUM LASER/STENT PLACEMENT Left 05/09/2018   Procedure: CYSTOSCOPY/URETEROSCOPY/HOLMIUM LASER/STENT PLACEMENT;  Surgeon: Abbie Sons, MD;  Location: [facility];  Service: Urology;  Laterality: Left;  . CYSTOSCOPY/URETEROSCOPY/HOLMIUM LASER/STENT PLACEMENT Right 09/13/2018   Procedure: CYSTOSCOPY/URETEROSCOPY/HOLMIUM LASER/STENT PLACEMENT;  Surgeon: Abbie Sons, MD;  Location: [facility];  Service: Urology;  Laterality: Right;  . INCONTINENCE SURGERY     BLADDER TUCK  . PILONIDAL CYST EXCISION    . REMOVAL OF BILATERAL TISSUE EXPANDERS WITH PLACEMENT OF BILATERAL BREAST IMPLANTS Bilateral 01/02/2020   Procedure: REMOVAL OF BILATERAL TISSUE EXPANDERS WITH PLACEMENT OF BILATERAL BREAST IMPLANTS;  Surgeon: Wallace Going, DO;  Location: [place];  Service: Plastics;  Laterality: Bilateral;  2 hours, please  . STONE EXTRACTION WITH BASKET Right 09/13/2018   Procedure: STONE EXTRACTION WITH BASKET;  Surgeon: Abbie Sons, MD;  Location: [facility];  Service: Urology;  Laterality: Right;  . TOTAL MASTECTOMY Bilateral 08/27/2019   Procedure: TOTAL MASTECTOMY;  Surgeon: Herbert Pun, MD;  Location: [facility];  Service: General;  Laterality: Bilateral;  START @930  DUE TO SENTINEL NODE  . UPPER GI ENDOSCOPY      There were no vitals filed for this visit.   Subjective Assessment - 03/10/20 0745    Subjective Reports some pain today. Had trouble sleeping one night due pain. Reports HEP compliance.    Pertinent  History Pt is a 55 year old female s/p L proximal humerus fracture 12/02/10. PMH double masectomy 08/27/19 with expander placement and reconstruction finished 01/03/20.Pt fell in [place] walking straight forward onto her shoulder. Patient reports fracture was non-displaced and she did not have surgery, by Dr. Adrian Prince advisement, wore sling 6 weeks. Currently does not know orthopedic expectations for weight restrictions/movement resistrictions, but  has not been lifting more than 1/2 gallon of milk, but is no longer wearing sling. 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 Patient reports motion is very restricted and she is unable to don/doff clothes, her husband washes her hair for her, unable to push/pull, or lift anything. Pt is an avid golfer, and walks often. Worst shoulder pain over the past week 8/10 best 0/10 but reports she avoids painful movement.  Pt is R hand dominant. Pt denies N/V, B&B changes, unexplained weight fluctuation, saddle paresthesia, fever, night sweats, or unrelenting night pain at this time.    Limitations Lifting;House hold activities;Writing    How long can you sit comfortably? unlimited    How long can you stand comfortably? unlimited    How long can you walk comfortably? unlimited    Diagnostic tests Xrays [facility] 12/03/19    Patient Stated Goals Full ROM to complete self care               Ther-Ex Pulleys abd x15; flex x15 with cuing for set up and posture with good carry over following Seated with 1/2 foam roll at tspine dowel LUE AROM + with RUE AAROM once unable to continue LUE AROM x12 Same set up abd 2 x12 Seated flex short lever x10; with 1# DB x8  Short lever abd 2x 8 1# DB Seated OMEGA press 5# 2x 10/8 with min cuing to prevent shoulder hiking Standing rows 15# x10; 20# x8with good carry over following min cuing for set up doorway pec/bicep stretch x60sec   Manual PROM all directions in pain free range      PT Education - 03/10/20 0753    Education Details therex form/technique    Person(s) Educated Patient    Methods Explanation;Demonstration;Verbal cues    Comprehension Verbalized understanding;Returned demonstration;Verbal cues required            PT Short Term Goals - 01/25/20 1002      PT SHORT TERM GOAL #1   Title Pt will be independent with HEP in order to improve strength and decrease pain in order to  improve pain-free function at home and work.    Baseline 01/23/20 HEP given    Time 4    Period Weeks    Status New             PT Long Term Goals - 01/25/20 1003      PT LONG TERM GOAL #1   Title Pt will demonstrate full active L shoulder ROM in order to complete self care and overhead ADLS    Baseline 40/08/67 flex 39d abd 39d  ER L ear IR L PSIS    Time 8    Period Weeks    Status New      PT LONG TERM GOAL #2   Title Pt will decrease worst pain as reported on NPRS by at least 3 points in order to demonstrate clinically significant reduction in pain.    Baseline 01/25/20 8/10    Time 8    Period Weeks    Status  New      PT LONG TERM GOAL #3   Title Pt will demonstrate L gross shoulder strength of 5/5 in order to demonstrate PLOF and strength needed for heavy household tasks    Baseline 01/23/20 flex 2+ abd 2+ ER 2+ IR 2+    Time 8    Period Weeks    Status New      PT LONG TERM GOAL #4   Title Patient will increase FOTO score to 79 to demonstrate predicted increase in functional mobility to complete ADLs                 Plan - 03/10/20 0757    Clinical Impression Statement PT continued therex progression for increased shoulder mobility and strength with success. Patient is able to comply with all cuing for proper technique of therex with good motivation throughout session. Pt continues to demonstrate steady progress with motion (active flex: 110d; abd 78d), with pain with PROM (passive active flex 160d; abd 78d). PT will continue progression as able.    Personal Factors and Comorbidities Comorbidity 1;Comorbidity 2;Comorbidity 3+;Past/Current Experience;Fitness;Time since onset of injury/illness/exacerbation    Comorbidities breast cancer, HTN, GERD, anxiety    Examination-Activity Limitations Bathing;Lift;Transfers;Reach Overhead;Carry;Dressing    Examination-Participation Restrictions Yard Work;Cleaning;Community Activity;Driving;Occupation;Laundry     Stability/Clinical Decision Making Evolving/Moderate complexity    Clinical Decision Making Moderate    Rehab Potential Poor    PT Frequency 2x / week    PT Duration 8 weeks    PT Treatment/Interventions ADLs/Self Care Home Management;Electrical Stimulation;Therapeutic exercise;Balance training;Joint Manipulations;Taping;Spinal Manipulations;Cryotherapy;Iontophoresis 70m/ml Dexamethasone;DME Instruction;Neuromuscular re-education;Gait training;Stair training;Functional mobility training;Moist Heat;Traction;Ultrasound;Therapeutic activities;Patient/family education;Manual techniques;Dry needling;Passive range of motion;Vestibular    PT Next Visit Plan PROM/AAROM    PT Home Exercise Plan pulleys abd/flex    Consulted and Agree with Plan of Care Patient           Patient will benefit from skilled therapeutic intervention in order to improve the following deficits and impairments:  Decreased balance,Impaired flexibility,Impaired UE functional use,Hypomobility,Decreased strength,Decreased range of motion,Decreased endurance,Decreased activity tolerance,Postural dysfunction,Increased muscle spasms,Hypermobility,Decreased mobility,Decreased coordination,Abnormal gait,Pain,Improper body mechanics,Increased fascial restricitons  Visit Diagnosis: Acute pain of left shoulder  Stiffness of left shoulder, not elsewhere classified     Problem List Patient Active Problem List   Diagnosis Date Noted  . S/P breast reconstruction, bilateral 03/06/2020  . Sebaceous cyst 12/31/2019  . Breast wound 10/05/2019  . Acquired absence of bilateral breasts and nipples 09/04/2019  . Genetic testing 08/10/2019  . Monoallelic mutation of CHEK2 gene in female patient 08/07/2019  . Malignant neoplasm of upper-outer quadrant of right breast in female, estrogen receptor positive ([place] 07/30/2019  . Goals of care, counseling/discussion 07/30/2019  . Family history of prostate cancer   . Family history of colon  cancer   . Breast cancer ([place] 07/25/2019  . Left lower quadrant pain 05/23/2018  . Personal history of kidney stones 04/20/2018  . Microscopic hematuria 04/20/2018  . 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 Overweight (BMI 25.0-29.9) 01/22/2017  . Encounter for preventive health examination 06/30/2016  . Renal colic 032/35/5732 . Ureteral calculus 04/12/2015  . Nephrolithiasis 03/04/2015  . Intractable migraine with aura without status migrainosus 10/29/2013  . Lack of libido 06/24/2013  . Migraine syndrome 03/11/2013  . Generalized anxiety disorder 03/11/2013   CDurwin RegesDPT CDurwin Reges1/31/2022, 11:01 AM  [facility] [address] [place] [address]Phone: [phone]  Fax:  [phone]  Name: Tiffany Acevedo MRN: [id number] Date of Birth: 1965-07-26

## 2020-03-14 ENCOUNTER — Other Ambulatory Visit: Payer: Self-pay

## 2020-03-14 ENCOUNTER — Ambulatory Visit: Payer: BC Managed Care – PPO | Attending: Oncology | Admitting: Physical Therapy

## 2020-03-14 ENCOUNTER — Encounter: Payer: Self-pay | Admitting: Physical Therapy

## 2020-03-14 DIAGNOSIS — M25512 Pain in left shoulder: Secondary | ICD-10-CM | POA: Diagnosis not present

## 2020-03-14 DIAGNOSIS — M25612 Stiffness of left shoulder, not elsewhere classified: Secondary | ICD-10-CM | POA: Insufficient documentation

## 2020-03-14 NOTE — Therapy (Signed)
[facility] [address] [place], [address] Phone: [phone]   Fax:  [phone]  Physical Therapy Treatment  Patient Details  Name: Tiffany Acevedo MRN: [id number] Date of Birth: 02-02-1966 No data recorded  Encounter Date: 03/14/2020   PT End of Session - 03/14/20 0811    Visit Number 11    Number of Visits 17    Date for PT Re-Evaluation 03/21/20    Authorization - Visit Number 11    Authorization - Number of Visits 17    PT Start Time 0800    PT Stop Time 0838    PT Time Calculation (min) 38 min    Activity Tolerance Patient tolerated treatment well    Behavior During Therapy [facility] for tasks assessed/performed           Past Medical History:  Diagnosis Date  . Allergy   . Anxiety   . Breast cancer ([place])   . Depression   . Excessive weight gain 06/30/2016  . Family history of colon cancer   . Family history of colon cancer   . Family history of prostate cancer   . Frequent headaches   . GERD (gastroesophageal reflux disease)   . History of kidney stones   . Hypertension   . Migraines   . Pre-diabetes     Past Surgical History:  Procedure Laterality Date  . BREAST BIOPSY Right 07/13/2019   stereo biopsy/ x clip/path pending  . BREAST BIOPSY Right 07/13/2019   stereo biopsy/coil clip/ path pending  . BREAST RECONSTRUCTION WITH PLACEMENT OF TISSUE EXPANDER AND FLEX HD (ACELLULAR HYDRATED DERMIS) Bilateral 08/27/2019   Procedure: BILATERAL BREAST RECONSTRUCTION WITH PLACEMENT OF TISSUE EXPANDER AND FLEX HD (ACELLULAR HYDRATED DERMIS);  Surgeon: Wallace Going, DO;  Location: [facility];  Service: Plastics;  Laterality: Bilateral;  . [place], 2004, 2013  . COLONOSCOPY  2015  . CYSTOSCOPY W/ RETROGRADES Right 09/13/2018   Procedure: CYSTOSCOPY WITH RETROGRADE PYELOGRAM;  Surgeon: Abbie Sons, MD;  Location: [facility];  Service: Urology;  Laterality: Right;  .  CYSTOSCOPY/URETEROSCOPY/HOLMIUM LASER/STENT PLACEMENT Left 05/09/2018   Procedure: CYSTOSCOPY/URETEROSCOPY/HOLMIUM LASER/STENT PLACEMENT;  Surgeon: Abbie Sons, MD;  Location: [facility];  Service: Urology;  Laterality: Left;  . CYSTOSCOPY/URETEROSCOPY/HOLMIUM LASER/STENT PLACEMENT Right 09/13/2018   Procedure: CYSTOSCOPY/URETEROSCOPY/HOLMIUM LASER/STENT PLACEMENT;  Surgeon: Abbie Sons, MD;  Location: [facility];  Service: Urology;  Laterality: Right;  . INCONTINENCE SURGERY     BLADDER TUCK  . PILONIDAL CYST EXCISION    . REMOVAL OF BILATERAL TISSUE EXPANDERS WITH PLACEMENT OF BILATERAL BREAST IMPLANTS Bilateral 01/02/2020   Procedure: REMOVAL OF BILATERAL TISSUE EXPANDERS WITH PLACEMENT OF BILATERAL BREAST IMPLANTS;  Surgeon: Wallace Going, DO;  Location: [place];  Service: Plastics;  Laterality: Bilateral;  2 hours, please  . STONE EXTRACTION WITH BASKET Right 09/13/2018   Procedure: STONE EXTRACTION WITH BASKET;  Surgeon: Abbie Sons, MD;  Location: [facility];  Service: Urology;  Laterality: Right;  . TOTAL MASTECTOMY Bilateral 08/27/2019   Procedure: TOTAL MASTECTOMY;  Surgeon: Herbert Pun, MD;  Location: [facility];  Service: General;  Laterality: Bilateral;  START @930  DUE TO SENTINEL NODE  . UPPER GI ENDOSCOPY      There were no vitals filed for this visit.   Subjective Assessment - 03/14/20 0816    Subjective Reports minimal pain today. HEP compliance, trying to move her LUE more often. Has been able to sleep  better since last visit    Pertinent History Pt is a 55 year old female s/p L proximal humerus fracture 12/02/10. PMH double masectomy 08/27/19 with expander placement and reconstruction finished 01/03/20.Pt fell in [place] walking straight forward onto her shoulder. Patient reports fracture was non-displaced and she did not have surgery, by Dr. Adrian Prince advisement, wore sling 6 weeks. Currently does not know orthopedic expectations for weight  restrictions/movement resistrictions, but has not been lifting more than 1/2 gallon of milk, but is no longer wearing sling. 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 Patient reports motion is very restricted and she is unable to don/doff clothes, her husband washes her hair for her, unable to push/pull, or lift anything. Pt is an avid golfer, and walks often. Worst shoulder pain over the past week 8/10 best 0/10 but reports she avoids painful movement.  Pt is R hand dominant. Pt denies N/V, B&B changes, unexplained weight fluctuation, saddle paresthesia, fever, night sweats, or unrelenting night pain at this time.    Limitations Lifting;House hold activities;Writing    How long can you sit comfortably? unlimited    How long can you stand comfortably? unlimited    How long can you walk comfortably? unlimited    Diagnostic tests Xrays [facility] 12/03/19    Patient Stated Goals Full ROM to complete self care    Pain Onset More than a month ago             Ther-Ex Pulleys abd x15; flex x15 with cuing for set up and posture with good carry over following Seated with 1/2 foam roll at tspine dowel LUE AROM + with RUE AAROM once unable to continue LUE AROMx12 Same set up abd2x12 Seated flex short lever 2# 2x 8; with 1# DB 2 x8/10 Short lever abd 2x 8 1# DB difficulty with combination ER Standing ER RTB x10; GTB x10  Seated OMEGA press 10# 3x 09/15/08 with min cuing to prevent shoulder hiking   Manual PROM all directions in pain free range       PT Education - 03/14/20 0814    Education Details therex form/technique    Person(s) Educated Patient    Methods Explanation;Demonstration;Verbal cues    Comprehension Verbalized understanding;Returned demonstration;Verbal cues required            PT Short Term Goals - 01/25/20 1002      PT SHORT TERM GOAL #1   Title Pt will be independent with HEP in order to improve strength and  decrease pain in order to improve pain-free function at home and work.    Baseline 01/23/20 HEP given    Time 4    Period Weeks    Status New             PT Long Term Goals - 01/25/20 1003      PT LONG TERM GOAL #1   Title Pt will demonstrate full active L shoulder ROM in order to complete self care and overhead ADLS    Baseline 53/29/92 flex 39d abd 39d  ER L ear IR L PSIS    Time 8    Period Weeks    Status New      PT LONG TERM GOAL #2   Title Pt will decrease worst pain as reported on NPRS by at least 3 points in order to demonstrate clinically significant reduction in pain.    Baseline 01/25/20 8/10    Time 8  Period Weeks    Status New      PT LONG TERM GOAL #3   Title Pt will demonstrate L gross shoulder strength of 5/5 in order to demonstrate PLOF and strength needed for heavy household tasks    Baseline 01/23/20 flex 2+ abd 2+ ER 2+ IR 2+    Time 8    Period Weeks    Status New      PT LONG TERM GOAL #4   Title Patient will increase FOTO score to 79 to demonstrate predicted increase in functional mobility to complete ADLs                 Plan - 03/14/20 1694    Clinical Impression Statement PT continued therex progression for increased shoulder mobility and strength with success. Patient with increased PROM this session, especially ER (approx 60d at 90d of abduction). Patient is motivated throughout session and completes all therex progressions with proper technique following min cuing. Pt with difficulty with ER activation in overhead positions, though she is demonstrating good strength in antigravity positions. PT will continue progression as able.    Personal Factors and Comorbidities Comorbidity 1;Comorbidity 2;Comorbidity 3+;Past/Current Experience;Fitness;Time since onset of injury/illness/exacerbation    Comorbidities breast cancer, HTN, GERD, anxiety    Examination-Activity Limitations Bathing;Lift;Transfers;Reach Overhead;Carry;Dressing     Examination-Participation Restrictions Yard Work;Cleaning;Community Activity;Driving;Occupation;Laundry    Stability/Clinical Decision Making Evolving/Moderate complexity    Clinical Decision Making Moderate    Rehab Potential Poor    PT Frequency 2x / week    PT Duration 8 weeks    PT Treatment/Interventions ADLs/Self Care Home Management;Electrical Stimulation;Therapeutic exercise;Balance training;Joint Manipulations;Taping;Spinal Manipulations;Cryotherapy;Iontophoresis 13m/ml Dexamethasone;DME Instruction;Neuromuscular re-education;Gait training;Stair training;Functional mobility training;Moist Heat;Traction;Ultrasound;Therapeutic activities;Patient/family education;Manual techniques;Dry needling;Passive range of motion;Vestibular    PT Next Visit Plan PROM/AAROM    PT Home Exercise Plan pulleys abd/flex    Consulted and Agree with Plan of Care Patient           Patient will benefit from skilled therapeutic intervention in order to improve the following deficits and impairments:  Decreased balance,Impaired flexibility,Impaired UE functional use,Hypomobility,Decreased strength,Decreased range of motion,Decreased endurance,Decreased activity tolerance,Postural dysfunction,Increased muscle spasms,Hypermobility,Decreased mobility,Decreased coordination,Abnormal gait,Pain,Improper body mechanics,Increased fascial restricitons  Visit Diagnosis: Acute pain of left shoulder  Stiffness of left shoulder, not elsewhere classified     Problem List Patient Active Problem List   Diagnosis Date Noted  . S/P breast reconstruction, bilateral 03/06/2020  . Sebaceous cyst 12/31/2019  . Breast wound 10/05/2019  . Acquired absence of bilateral breasts and nipples 09/04/2019  . Genetic testing 08/10/2019  . Monoallelic mutation of CHEK2 gene in female patient 08/07/2019  . Malignant neoplasm of upper-outer quadrant of right breast in female, estrogen receptor positive ([place] 07/30/2019  . Goals of  care, counseling/discussion 07/30/2019  . Family history of prostate cancer   . Family history of colon cancer   . Breast cancer ([place] 07/25/2019  . Left lower quadrant pain 05/23/2018  . Personal history of kidney stones 04/20/2018  . Microscopic hematuria 04/20/2018  . 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 Overweight (BMI 25.0-29.9) 01/22/2017  . Encounter for preventive health examination 06/30/2016  . Renal colic 050/38/8828 . Ureteral calculus 04/12/2015  . Nephrolithiasis 03/04/2015  . Intractable migraine with aura without status migrainosus 10/29/2013  . Lack of libido 06/24/2013  . Migraine syndrome 03/11/2013  . Generalized anxiety disorder 03/11/2013   CDurwin RegesDPT CDurwin Reges2/05/2020, 8:46 AM  [facility] [address] [place], [address] Phone: [phone]   Fax:  [phone]  Name: Tiffany Acevedo MRN: [id number] Date of Birth: 07-13-1965

## 2020-03-17 ENCOUNTER — Ambulatory Visit: Payer: BC Managed Care – PPO | Admitting: Physical Therapy

## 2020-03-17 ENCOUNTER — Encounter: Payer: Self-pay | Admitting: Physical Therapy

## 2020-03-17 ENCOUNTER — Other Ambulatory Visit: Payer: Self-pay

## 2020-03-17 DIAGNOSIS — M25512 Pain in left shoulder: Secondary | ICD-10-CM | POA: Diagnosis not present

## 2020-03-17 DIAGNOSIS — M25612 Stiffness of left shoulder, not elsewhere classified: Secondary | ICD-10-CM

## 2020-03-17 NOTE — Therapy (Signed)
[facility] [address], [place], [address] Phone: [phone]   Fax:  [phone]  Physical Therapy Treatment  Patient Details  Name: Tiffany Acevedo MRN: [id number] Date of Birth: [birth year] No data recorded  Encounter Date: 03/17/2020   PT End of Session - 03/17/20 1510    Visit Number 12    Number of Visits 17    Date for PT Re-Evaluation 03/21/20    Authorization - Visit Number 11    Authorization - Number of Visits 17    PT Start Time 0230    PT Stop Time 0315    PT Time Calculation (min) 45 min    Activity Tolerance Patient tolerated treatment well    Behavior During Therapy [facility] for tasks assessed/performed           Past Medical History:  Diagnosis Date  . Allergy   . Anxiety   . Breast cancer ([place])   . Depression   . Excessive weight gain 06/30/2016  . Family history of colon cancer   . Family history of colon cancer   . Family history of prostate cancer   . Frequent headaches   . GERD (gastroesophageal reflux disease)   . History of kidney stones   . Hypertension   . Migraines   . Pre-diabetes     Past Surgical History:  Procedure Laterality Date  . BREAST BIOPSY Right 07/13/2019   stereo biopsy/ x clip/path pending  . BREAST BIOPSY Right 07/13/2019   stereo biopsy/coil clip/ path pending  . BREAST RECONSTRUCTION WITH PLACEMENT OF TISSUE EXPANDER AND FLEX HD (ACELLULAR HYDRATED DERMIS) Bilateral 08/27/2019   Procedure: BILATERAL BREAST RECONSTRUCTION WITH PLACEMENT OF TISSUE EXPANDER AND FLEX HD (ACELLULAR HYDRATED DERMIS);  Surgeon: Wallace Going, DO;  Location: [facility];  Service: Plastics;  Laterality: Bilateral;  . [place], 2004, 2013  . COLONOSCOPY  2015  . CYSTOSCOPY W/ RETROGRADES Right 09/13/2018   Procedure: CYSTOSCOPY WITH RETROGRADE PYELOGRAM;  Surgeon: Abbie Sons, MD;  Location: [facility];  Service: Urology;  Laterality: Right;  .  CYSTOSCOPY/URETEROSCOPY/HOLMIUM LASER/STENT PLACEMENT Left 05/09/2018   Procedure: CYSTOSCOPY/URETEROSCOPY/HOLMIUM LASER/STENT PLACEMENT;  Surgeon: Abbie Sons, MD;  Location: [facility];  Service: Urology;  Laterality: Left;  . CYSTOSCOPY/URETEROSCOPY/HOLMIUM LASER/STENT PLACEMENT Right 09/13/2018   Procedure: CYSTOSCOPY/URETEROSCOPY/HOLMIUM LASER/STENT PLACEMENT;  Surgeon: Abbie Sons, MD;  Location: [facility];  Service: Urology;  Laterality: Right;  . INCONTINENCE SURGERY     BLADDER TUCK  . PILONIDAL CYST EXCISION    . REMOVAL OF BILATERAL TISSUE EXPANDERS WITH PLACEMENT OF BILATERAL BREAST IMPLANTS Bilateral 01/02/2020   Procedure: REMOVAL OF BILATERAL TISSUE EXPANDERS WITH PLACEMENT OF BILATERAL BREAST IMPLANTS;  Surgeon: Wallace Going, DO;  Location: [facility];  Service: Plastics;  Laterality: Bilateral;  2 hours, please  . STONE EXTRACTION WITH BASKET Right 09/13/2018   Procedure: STONE EXTRACTION WITH BASKET;  Surgeon: Abbie Sons, MD;  Location: [facility];  Service: Urology;  Laterality: Right;  . TOTAL MASTECTOMY Bilateral 08/27/2019   Procedure: TOTAL MASTECTOMY;  Surgeon: Herbert Pun, MD;  Location: [facility];  Service: General;  Laterality: Bilateral;  START @930  DUE TO SENTINEL NODE  . UPPER GI ENDOSCOPY      There were no vitals filed for this visit.   Subjective Assessment - 03/17/20 1506    Subjective PT is doing well overall. Saw Dr. Mack Guise last week, with xray with normal healing. Pt inquiring on  PT opinion of if she should get an MRI of the shoulder.    Pertinent History Pt is a 55 year old female s/p L proximal humerus fracture 12/02/10. PMH double masectomy 08/27/19 with expander placement and reconstruction finished 01/03/20.Pt fell in [place] walking straight forward onto her shoulder. Patient reports fracture was non-displaced and she did not have surgery, by Dr. Adrian Prince advisement, wore sling 6 weeks. Currently does not know  orthopedic expectations for weight restrictions/movement resistrictions, but has not been lifting more than 1/2 gallon of milk, but is no longer wearing sling. 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 Patient reports motion is very restricted and she is unable to don/doff clothes, her husband washes her hair for her, unable to push/pull, or lift anything. Pt is an avid golfer, and walks often. Worst shoulder pain over the past week 8/10 best 0/10 but reports she avoids painful movement.  Pt is R hand dominant. Pt denies N/V, B&B changes, unexplained weight fluctuation, saddle paresthesia, fever, night sweats, or unrelenting night pain at this time.    Limitations Lifting;House hold activities;Writing    How long can you sit comfortably? unlimited    How long can you stand comfortably? unlimited    How long can you walk comfortably? unlimited    Diagnostic tests Xrays [facility] 12/03/19    Patient Stated Goals Full ROM to complete self care    Pain Onset More than a month ago           Ther-Ex Pulleys abd x15; flex x15 with cuing for set up and posture with good carry over following Prone T 2x8 with heavy cuing to reduce scapular elevation with some success Prone W 2x 6/8 with TC for technique with good carry over Wall crawl with hold at top + body lean x6 3sec hold Seated with 1/2 foam roll at tspine dowel LUE AROM + with RUE Seated OMEGA press 10# 3x 10 with min cuing to prevent shoulder hiking Seated lat stretch 14mn     Manual PROM all directions in pain free range STM to UT/levator, midtrap, rhomboids with some reduction in tension                          PT Education - 03/17/20 1509    Education Details therex form/technique    Person(s) Educated Patient    Methods Explanation;Demonstration;Verbal cues    Comprehension Verbalized understanding;Returned demonstration;Verbal cues required             PT Short Term Goals - 01/25/20 1002      PT SHORT TERM GOAL #1   Title Pt will be independent with HEP in order to improve strength and decrease pain in order to improve pain-free function at home and work.    Baseline 01/23/20 HEP given    Time 4    Period Weeks    Status New             PT Long Term Goals - 01/25/20 1003      PT LONG TERM GOAL #1   Title Pt will demonstrate full active L shoulder ROM in order to complete self care and overhead ADLS    Baseline 173/53/29flex 39d abd 39d  ER L ear IR L PSIS    Time 8    Period Weeks    Status New      PT LONG TERM GOAL #2  Title Pt will decrease worst pain as reported on NPRS by at least 3 points in order to demonstrate clinically significant reduction in pain.    Baseline 01/25/20 8/10    Time 8    Period Weeks    Status New      PT LONG TERM GOAL #3   Title Pt will demonstrate L gross shoulder strength of 5/5 in order to demonstrate PLOF and strength needed for heavy household tasks    Baseline 01/23/20 flex 2+ abd 2+ ER 2+ IR 2+    Time 8    Period Weeks    Status New      PT LONG TERM GOAL #4   Title Patient will increase FOTO score to 79 to demonstrate predicted increase in functional mobility to complete ADLs                 Plan - 03/17/20 1518    Clinical Impression Statement PT continued progression for increased shoulder mobility with success. Patient is continuing to make slow and steady progress toward ROM goals. Pt inquired about possible need for MRI. PT encouraged pt that she is making progress, it is slower than typical, but that this could be attributed to multiple other surgeries/cancer treatments. PT advised patient that most RTC, labrum were negative, but it is difficulty to assess these, and tests have questionable validity due to not having motion needed for testing positions; and that this is the need for the MRI to determine those answers to ensure no further injry present that is  restricting healing. Patient is able to comply with proper technique of all therex following multimodal cuing. PT will continue progression as able.    Personal Factors and Comorbidities Comorbidity 1;Comorbidity 2;Comorbidity 3+;Past/Current Experience;Fitness;Time since onset of injury/illness/exacerbation    Comorbidities breast cancer, HTN, GERD, anxiety    Examination-Activity Limitations Bathing;Lift;Transfers;Reach Overhead;Carry;Dressing    Examination-Participation Restrictions Yard Work;Cleaning;Community Activity;Driving;Occupation;Laundry    Stability/Clinical Decision Making Evolving/Moderate complexity    Clinical Decision Making Moderate    Rehab Potential Good    PT Frequency 2x / week    PT Duration 8 weeks    PT Treatment/Interventions ADLs/Self Care Home Management;Electrical Stimulation;Therapeutic exercise;Balance training;Joint Manipulations;Taping;Spinal Manipulations;Cryotherapy;Iontophoresis 73m/ml Dexamethasone;DME Instruction;Neuromuscular re-education;Gait training;Stair training;Functional mobility training;Moist Heat;Traction;Ultrasound;Therapeutic activities;Patient/family education;Manual techniques;Dry needling;Passive range of motion;Vestibular    PT Next Visit Plan PROM/AAROM    PT Home Exercise Plan pulleys abd/flex           Patient will benefit from skilled therapeutic intervention in order to improve the following deficits and impairments:  Decreased balance,Impaired flexibility,Impaired UE functional use,Hypomobility,Decreased strength,Decreased range of motion,Decreased endurance,Decreased activity tolerance,Postural dysfunction,Increased muscle spasms,Hypermobility,Decreased mobility,Decreased coordination,Abnormal gait,Pain,Improper body mechanics,Increased fascial restricitons  Visit Diagnosis: Acute pain of left shoulder  Stiffness of left shoulder, not elsewhere classified     Problem List Patient Active Problem List   Diagnosis Date Noted   . S/P breast reconstruction, bilateral 03/06/2020  . Sebaceous cyst 12/31/2019  . Breast wound 10/05/2019  . Acquired absence of bilateral breasts and nipples 09/04/2019  . Genetic testing 08/10/2019  . Monoallelic mutation of CHEK2 gene in female patient 08/07/2019  . Malignant neoplasm of upper-outer quadrant of right breast in female, estrogen receptor positive ([place] 07/30/2019  . Goals of care, counseling/discussion 07/30/2019  . Family history of prostate cancer   . Family history of colon cancer   . Breast cancer ([place] 07/25/2019  . Left lower quadrant pain 05/23/2018  . Personal history of kidney stones 04/20/2018  .  Microscopic hematuria 04/20/2018  . 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 Overweight (BMI 25.0-29.9) 01/22/2017  . Encounter for preventive health examination 06/30/2016  . Renal colic 32/44/0102  . Ureteral calculus 04/12/2015  . Nephrolithiasis 03/04/2015  . Intractable migraine with aura without status migrainosus 10/29/2013  . Lack of libido 06/24/2013  . Migraine syndrome 03/11/2013  . Generalized anxiety disorder 03/11/2013   Durwin Reges DPT Durwin Reges 03/17/2020, 4:07 PM  [facility] [address] [place], [address] Phone: [phone]   Fax:  [phone]  Name: Tiffany Acevedo MRN: [id number] Date of Birth: Dec 28, 1965

## 2020-03-19 ENCOUNTER — Ambulatory Visit: Payer: BC Managed Care – PPO | Admitting: Physical Therapy

## 2020-03-19 ENCOUNTER — Other Ambulatory Visit: Payer: Self-pay

## 2020-03-19 ENCOUNTER — Encounter: Payer: Self-pay | Admitting: Physical Therapy

## 2020-03-19 DIAGNOSIS — M25612 Stiffness of left shoulder, not elsewhere classified: Secondary | ICD-10-CM

## 2020-03-19 DIAGNOSIS — M25512 Pain in left shoulder: Secondary | ICD-10-CM

## 2020-03-19 NOTE — Therapy (Signed)
[facility] [address] [place], [address] Phone: [phone]   Fax:  [phone]  Physical Therapy Treatment  Patient Details  Name: Tiffany Acevedo MRN: [id number] Date of Birth: 09/13/1965 No data recorded  Encounter Date: 03/19/2020   PT End of Session - 03/19/20 0758    Visit Number 13    Number of Visits 17    Date for PT Re-Evaluation 03/21/20    Authorization - Visit Number 12    Authorization - Number of Visits 17    PT Start Time 6659    PT Stop Time 0815    PT Time Calculation (min) 40 min    Activity Tolerance Patient tolerated treatment well    Behavior During Therapy [facility] for tasks assessed/performed           Past Medical History:  Diagnosis Date  . Allergy   . Anxiety   . Breast cancer ([place])   . Depression   . Excessive weight gain 06/30/2016  . Family history of colon cancer   . Family history of colon cancer   . Family history of prostate cancer   . Frequent headaches   . GERD (gastroesophageal reflux disease)   . History of kidney stones   . Hypertension   . Migraines   . Pre-diabetes     Past Surgical History:  Procedure Laterality Date  . BREAST BIOPSY Right 07/13/2019   stereo biopsy/ x clip/path pending  . BREAST BIOPSY Right 07/13/2019   stereo biopsy/coil clip/ path pending  . BREAST RECONSTRUCTION WITH PLACEMENT OF TISSUE EXPANDER AND FLEX HD (ACELLULAR HYDRATED DERMIS) Bilateral 08/27/2019   Procedure: BILATERAL BREAST RECONSTRUCTION WITH PLACEMENT OF TISSUE EXPANDER AND FLEX HD (ACELLULAR HYDRATED DERMIS);  Surgeon: Wallace Going, DO;  Location: [facility];  Service: Plastics;  Laterality: Bilateral;  . [place], 2004, 2013  . COLONOSCOPY  2015  . CYSTOSCOPY W/ RETROGRADES Right 09/13/2018   Procedure: CYSTOSCOPY WITH RETROGRADE PYELOGRAM;  Surgeon: Abbie Sons, MD;  Location: [facility];  Service: Urology;  Laterality: Right;  .  CYSTOSCOPY/URETEROSCOPY/HOLMIUM LASER/STENT PLACEMENT Left 05/09/2018   Procedure: CYSTOSCOPY/URETEROSCOPY/HOLMIUM LASER/STENT PLACEMENT;  Surgeon: Abbie Sons, MD;  Location: [facility];  Service: Urology;  Laterality: Left;  . CYSTOSCOPY/URETEROSCOPY/HOLMIUM LASER/STENT PLACEMENT Right 09/13/2018   Procedure: CYSTOSCOPY/URETEROSCOPY/HOLMIUM LASER/STENT PLACEMENT;  Surgeon: Abbie Sons, MD;  Location: [facility];  Service: Urology;  Laterality: Right;  . INCONTINENCE SURGERY     BLADDER TUCK  . PILONIDAL CYST EXCISION    . REMOVAL OF BILATERAL TISSUE EXPANDERS WITH PLACEMENT OF BILATERAL BREAST IMPLANTS Bilateral 01/02/2020   Procedure: REMOVAL OF BILATERAL TISSUE EXPANDERS WITH PLACEMENT OF BILATERAL BREAST IMPLANTS;  Surgeon: Wallace Going, DO;  Location: [facility];  Service: Plastics;  Laterality: Bilateral;  2 hours, please  . STONE EXTRACTION WITH BASKET Right 09/13/2018   Procedure: STONE EXTRACTION WITH BASKET;  Surgeon: Abbie Sons, MD;  Location: [facility];  Service: Urology;  Laterality: Right;  . TOTAL MASTECTOMY Bilateral 08/27/2019   Procedure: TOTAL MASTECTOMY;  Surgeon: Herbert Pun, MD;  Location: [facility];  Service: General;  Laterality: Bilateral;  START _0  DUE TO SENTINEL NODE  . UPPER GI ENDOSCOPY      There were no vitals filed for this visit.   Subjective Assessment - 03/19/20 0739    Subjective Doing well overall. Reports some pain last night in arm (upper arm and forearm) that subsided after an  hour- is a dull ache.    Pertinent History Pt is a 55 year old female s/p L proximal humerus fracture 12/02/10. PMH double masectomy 08/27/19 with expander placement and reconstruction finished 01/03/20.Pt fell in [place] walking straight forward onto her shoulder. Patient reports fracture was non-displaced and she did not have surgery, by Dr. Adrian Prince advisement, wore sling 6 weeks. Currently does not know orthopedic expectations for weight  restrictions/movement resistrictions, but has not been lifting more than 1/2 gallon of milk, but is no longer wearing sling. 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 Patient reports motion is very restricted and she is unable to don/doff clothes, her husband washes her hair for her, unable to push/pull, or lift anything. Pt is an avid golfer, and walks often. Worst shoulder pain over the past week 8/10 best 0/10 but reports she avoids painful movement.  Pt is R hand dominant. Pt denies N/V, B&B changes, unexplained weight fluctuation, saddle paresthesia, fever, night sweats, or unrelenting night pain at this time.    Limitations Lifting;House hold activities;Writing    How long can you sit comfortably? unlimited    How long can you stand comfortably? unlimited    How long can you walk comfortably? unlimited    Diagnostic tests Xrays [facility] 12/03/19    Patient Stated Goals Full ROM to complete self care    Pain Onset More than a month ago             Ther-Ex Pulleys abd x15; flex x15 with cuing for set up and posture with good carry over following Prone W 2x 6/8 with TC for technique with good carry over Seated with 1/2 foam roll at tspine dowel LUE AROM + with RUE Seated OMEGA press10#3x10with min cuing to prevent shoulder hiking Lat pulldown 15# x10; 20# x10 with  Seated lat stretch 18mn     Manual PROM all directions; contract relax to flex/abd/IR/ER 10sec contract 15sec relax with overpressure x2 STM to UT/levator, midtrap, rhomboids with some reduction in tension       PT Education - 03/19/20 0750    Education Details therex form/technique    Person(s) Educated Patient    Methods Explanation;Demonstration;Verbal cues    Comprehension Verbalized understanding;Returned demonstration;Verbal cues required            PT Short Term Goals - 01/25/20 1002      PT SHORT TERM GOAL #1   Title Pt will be  independent with HEP in order to improve strength and decrease pain in order to improve pain-free function at home and work.    Baseline 01/23/20 HEP given    Time 4    Period Weeks    Status New             PT Long Term Goals - 01/25/20 1003      PT LONG TERM GOAL #1   Title Pt will demonstrate full active L shoulder ROM in order to complete self care and overhead ADLS    Baseline 178/93/81flex 39d abd 39d  ER L ear IR L PSIS    Time 8    Period Weeks    Status New      PT LONG TERM GOAL #2   Title Pt will decrease worst pain as reported on NPRS by at least 3 points in order to demonstrate clinically significant reduction in pain.    Baseline 01/25/20 8/10    Time 8  Period Weeks    Status New      PT LONG TERM GOAL #3   Title Pt will demonstrate L gross shoulder strength of 5/5 in order to demonstrate PLOF and strength needed for heavy household tasks    Baseline 01/23/20 flex 2+ abd 2+ ER 2+ IR 2+    Time 8    Period Weeks    Status New      PT LONG TERM GOAL #4   Title Patient will increase FOTO score to 79 to demonstrate predicted increase in functional mobility to complete ADLs                 Plan - 03/19/20 0800    Clinical Impression Statement PT continued therex progression for increased shoulder mobility and strength with success. Pt is able to comply with all all cuing for proper technique of therex with good motivation throughout session. Pt will complete formal reassessment next visit, though ROM improvements are evident through session. PT will continue progressiona as able.    Personal Factors and Comorbidities Comorbidity 1;Comorbidity 2;Comorbidity 3+;Past/Current Experience;Fitness;Time since onset of injury/illness/exacerbation    Comorbidities breast cancer, HTN, GERD, anxiety    Examination-Activity Limitations Bathing;Lift;Transfers;Reach Overhead;Carry;Dressing    Examination-Participation Restrictions Yard Work;Cleaning;Community  Activity;Driving;Occupation;Laundry    Stability/Clinical Decision Making Evolving/Moderate complexity    Clinical Decision Making Moderate    Rehab Potential Good    PT Frequency 2x / week    PT Duration 8 weeks    PT Treatment/Interventions ADLs/Self Care Home Management;Electrical Stimulation;Therapeutic exercise;Balance training;Joint Manipulations;Taping;Spinal Manipulations;Cryotherapy;Iontophoresis 7m/ml Dexamethasone;DME Instruction;Neuromuscular re-education;Gait training;Stair training;Functional mobility training;Moist Heat;Traction;Ultrasound;Therapeutic activities;Patient/family education;Manual techniques;Dry needling;Passive range of motion;Vestibular    PT Next Visit Plan PROM/AAROM    PT Home Exercise Plan pulleys abd/flex    Consulted and Agree with Plan of Care Patient           Patient will benefit from skilled therapeutic intervention in order to improve the following deficits and impairments:  Decreased balance,Impaired flexibility,Impaired UE functional use,Hypomobility,Decreased strength,Decreased range of motion,Decreased endurance,Decreased activity tolerance,Postural dysfunction,Increased muscle spasms,Hypermobility,Decreased mobility,Decreased coordination,Abnormal gait,Pain,Improper body mechanics,Increased fascial restricitons  Visit Diagnosis: Acute pain of left shoulder  Stiffness of left shoulder, not elsewhere classified     Problem List Patient Active Problem List   Diagnosis Date Noted  . S/P breast reconstruction, bilateral 03/06/2020  . Sebaceous cyst 12/31/2019  . Breast wound 10/05/2019  . Acquired absence of bilateral breasts and nipples 09/04/2019  . Genetic testing 08/10/2019  . Monoallelic mutation of CHEK2 gene in female patient 08/07/2019  . Malignant neoplasm of upper-outer quadrant of right breast in female, estrogen receptor positive ([facility] [place] 07/30/2019  . Goals of care, counseling/discussion 07/30/2019  . Family history of prostate  cancer   . Family history of colon cancer   . Breast cancer ([facility] 07/25/2019  . Left lower quadrant pain 05/23/2018  . Personal history of kidney stones 04/20/2018  . Microscopic hematuria 04/20/2018  . 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 Overweight (BMI 25.0-29.9) 01/22/2017  . Encounter for preventive health examination 06/30/2016  . Renal colic 017/51/0258 . Ureteral calculus 04/12/2015  . Nephrolithiasis 03/04/2015  . Intractable migraine with aura without status migrainosus 10/29/2013  . Lack of libido 06/24/2013  . Migraine syndrome 03/11/2013  . Generalized anxiety disorder 03/11/2013   CDurwin RegesDPT CDurwin Reges2/10/2020, 8:24 AM  [facility] [address] [place] [address]Phone: [phone]  Fax:  [phone]  Name: Hilda Rynders MRN: [id number] Date of Birth: 1965-06-12

## 2020-03-20 LAB — POC COVID19 BINAXNOW: SARS Coronavirus 2 Ag: POSITIVE — AB

## 2020-03-24 ENCOUNTER — Ambulatory Visit: Payer: BC Managed Care – PPO | Admitting: Physical Therapy

## 2020-03-25 ENCOUNTER — Encounter: Payer: Self-pay | Admitting: Plastic Surgery

## 2020-03-25 ENCOUNTER — Telehealth (INDEPENDENT_AMBULATORY_CARE_PROVIDER_SITE_OTHER): Payer: BC Managed Care – PPO | Admitting: Plastic Surgery

## 2020-03-25 ENCOUNTER — Other Ambulatory Visit: Payer: Self-pay

## 2020-03-25 DIAGNOSIS — Z9889 Other specified postprocedural states: Secondary | ICD-10-CM

## 2020-03-25 DIAGNOSIS — Z9013 Acquired absence of bilateral breasts and nipples: Secondary | ICD-10-CM

## 2020-03-25 DIAGNOSIS — N651 Disproportion of reconstructed breast: Secondary | ICD-10-CM | POA: Insufficient documentation

## 2020-03-25 NOTE — Progress Notes (Signed)
   Subjective:    Patient ID: Tiffany Acevedo, female    DOB: July 23, 1965, 55 y.o.   MRN: [id number]  The patient is a 55 yrs old female joining me by phone.  She underwent bilateral mastectomies with reconstruction.  Her 597 cc mentor silicone implants were placed in November.   She is healing well from the surgery and her left shoulder is doing well from her fracture after a fall.  She would like better symmetry.     Review of Systems  Constitutional: Positive for activity change. Negative for appetite change and chills.  Eyes: Negative.   Respiratory: Negative for chest tightness and shortness of breath.   Cardiovascular: Negative for leg swelling.  Gastrointestinal: Negative for abdominal distention and abdominal pain.  Endocrine: Negative.   Genitourinary: Negative.   Musculoskeletal: Negative for back pain and neck pain.  Hematological: Negative.   Psychiatric/Behavioral: Negative.        Objective:   Physical Exam Virtual visit       Assessment & Plan:     ICD-10-CM   1. S/P breast reconstruction, bilateral  Z98.890   2. Acquired absence of bilateral breasts and nipples  Z90.13   3. Breast asymmetry following reconstructive surgery  N65.1      The patient gave consent to have this visit done by telemedicine / virtual visit.  This is also consent for access the chart and treat the patient via this visit. The patient is located at work.  I, the provider, am at the office.  We spent 10 minutes together for the visit.  Joined by her husband.     Plan for lipofilling of bilateral breasts for improved symmetry.

## 2020-03-26 ENCOUNTER — Ambulatory Visit: Payer: BC Managed Care – PPO | Admitting: Physical Therapy

## 2020-03-26 ENCOUNTER — Other Ambulatory Visit: Payer: Self-pay

## 2020-03-26 ENCOUNTER — Encounter: Payer: Self-pay | Admitting: Physical Therapy

## 2020-03-26 DIAGNOSIS — M25512 Pain in left shoulder: Secondary | ICD-10-CM

## 2020-03-26 DIAGNOSIS — M25612 Stiffness of left shoulder, not elsewhere classified: Secondary | ICD-10-CM

## 2020-03-26 NOTE — Therapy (Addendum)
[facility] [address], [place], [address] Phone: [phone]   Fax:  [phone]  Physical Therapy Treatment/Progress Note Reporting Period 01/22/21 - 03/26/20  Patient Details  Name: Tiffany Acevedo MRN: [id number] Date of Birth: [birth year] No data recorded  Encounter Date: 03/26/2020   PT End of Session - 03/26/20 0929    Visit Number 14    Number of Visits 17    Date for PT Re-Evaluation 05/16/20    Authorization - Visit Number 14    Authorization - Number of Visits 34    PT Start Time 0736    PT Stop Time 0815    PT Time Calculation (min) 39 min    Activity Tolerance Patient tolerated treatment well    Behavior During Therapy [facility] for tasks assessed/performed           Past Medical History:  Diagnosis Date  . Allergy   . Anxiety   . Breast cancer ([place])   . Depression   . Excessive weight gain 06/30/2016  . Family history of colon cancer   . Family history of colon cancer   . Family history of prostate cancer   . Frequent headaches   . GERD (gastroesophageal reflux disease)   . History of kidney stones   . Hypertension   . Migraines   . Pre-diabetes     Past Surgical History:  Procedure Laterality Date  . BREAST BIOPSY Right 07/13/2019   stereo biopsy/ x clip/path pending  . BREAST BIOPSY Right 07/13/2019   stereo biopsy/coil clip/ path pending  . BREAST RECONSTRUCTION WITH PLACEMENT OF TISSUE EXPANDER AND FLEX HD (ACELLULAR HYDRATED DERMIS) Bilateral 08/27/2019   Procedure: BILATERAL BREAST RECONSTRUCTION WITH PLACEMENT OF TISSUE EXPANDER AND FLEX HD (ACELLULAR HYDRATED DERMIS);  Surgeon: Wallace Going, DO;  Location: [facility];  Service: Plastics;  Laterality: Bilateral;  . [place], 2004, 2013  . COLONOSCOPY  2015  . CYSTOSCOPY W/ RETROGRADES Right 09/13/2018   Procedure: CYSTOSCOPY WITH RETROGRADE PYELOGRAM;  Surgeon: Abbie Sons, MD;  Location:  [facility];  Service: Urology;  Laterality: Right;  . CYSTOSCOPY/URETEROSCOPY/HOLMIUM LASER/STENT PLACEMENT Left 05/09/2018   Procedure: CYSTOSCOPY/URETEROSCOPY/HOLMIUM LASER/STENT PLACEMENT;  Surgeon: Abbie Sons, MD;  Location: [facility];  Service: Urology;  Laterality: Left;  . CYSTOSCOPY/URETEROSCOPY/HOLMIUM LASER/STENT PLACEMENT Right 09/13/2018   Procedure: CYSTOSCOPY/URETEROSCOPY/HOLMIUM LASER/STENT PLACEMENT;  Surgeon: Abbie Sons, MD;  Location: [facility];  Service: Urology;  Laterality: Right;  . INCONTINENCE SURGERY     BLADDER TUCK  . PILONIDAL CYST EXCISION    . REMOVAL OF BILATERAL TISSUE EXPANDERS WITH PLACEMENT OF BILATERAL BREAST IMPLANTS Bilateral 01/02/2020   Procedure: REMOVAL OF BILATERAL TISSUE EXPANDERS WITH PLACEMENT OF BILATERAL BREAST IMPLANTS;  Surgeon: Wallace Going, DO;  Location: [place];  Service: Plastics;  Laterality: Bilateral;  2 hours, please  . STONE EXTRACTION WITH BASKET Right 09/13/2018   Procedure: STONE EXTRACTION WITH BASKET;  Surgeon: Abbie Sons, MD;  Location: [facility];  Service: Urology;  Laterality: Right;  . TOTAL MASTECTOMY Bilateral 08/27/2019   Procedure: TOTAL MASTECTOMY;  Surgeon: Herbert Pun, MD;  Location: [facility];  Service: General;  Laterality: Bilateral;  START @930  DUE TO SENTINEL NODE  . UPPER GI ENDOSCOPY      There were no vitals filed for this visit.   Subjective Assessment - 03/26/20 0800    Subjective Doing well overall. Feels like progress is plateauing. No pain on arrival  Pertinent History Pt is a 55 year old female s/p L proximal humerus fracture 12/02/10. PMH double masectomy 08/27/19 with expander placement and reconstruction finished 01/03/20.Pt fell in [place] walking straight forward onto her shoulder. Patient reports fracture was non-displaced and she did not have surgery, by Dr. Adrian Prince advisement, wore sling 6 weeks. Currently does not know orthopedic expectations for weight  restrictions/movement resistrictions, but has not been lifting more than 1/2 gallon of milk, but is no longer wearing sling. 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 Patient reports motion is very restricted and she is unable to don/doff clothes, her husband washes her hair for her, unable to push/pull, or lift anything. Pt is an avid golfer, and walks often. Worst shoulder pain over the past week 8/10 best 0/10 but reports she avoids painful movement.  Pt is R hand dominant. Pt denies N/V, B&B changes, unexplained weight fluctuation, saddle paresthesia, fever, night sweats, or unrelenting night pain at this time.    Limitations Lifting;House hold activities;Writing    How long can you sit comfortably? unlimited    How long can you stand comfortably? unlimited    How long can you walk comfortably? unlimited    Diagnostic tests Xrays [facility] 12/03/19    Patient Stated Goals Full ROM to complete self care    Pain Onset More than a month ago           Ther-Ex TRX flex/abd walkouts x12 Total gym "pull ups" from near horizontal position Prone W 3x 8/7/6 with difficulty with scapular retraction Seated lat stretch 44mn    Manual PROM all directions; contract relax to flex/abd/IR/ER 10sec contract 15sec relax with overpressure x2 PROM to scapula all directions STM to UT/levator, midtrap, rhomboids with some reduction in tension                          PT Education - 03/26/20 0928    Education Details therex form/technique    Person(s) Educated Patient    Methods Explanation;Demonstration;Verbal cues    Comprehension Verbalized understanding;Returned demonstration;Verbal cues required            PT Short Term Goals - 01/25/20 1002      PT SHORT TERM GOAL #1   Title Pt will be independent with HEP in order to improve strength and decrease pain in order to improve pain-free function at home and work.     Baseline 01/23/20 HEP given    Time 4    Period Weeks    Status New             PT Long Term Goals - 01/25/20 1003      PT LONG TERM GOAL #1   Title Pt will demonstrate full active L shoulder ROM in order to complete self care and overhead ADLS    Baseline 116/10/96flex 39d abd 39d  ER L ear IR L PSIS    Time 8    Period Weeks    Status New      PT LONG TERM GOAL #2   Title Pt will decrease worst pain as reported on NPRS by at least 3 points in order to demonstrate clinically significant reduction in pain.    Baseline 01/25/20 8/10    Time 8    Period Weeks    Status New      PT LONG TERM GOAL #3   Title Pt will demonstrate L  gross shoulder strength of 5/5 in order to demonstrate PLOF and strength needed for heavy household tasks    Baseline 01/23/20 flex 2+ abd 2+ ER 2+ IR 2+    Time 8    Period Weeks    Status New      PT LONG TERM GOAL #4   Title Patient will increase FOTO score to 79 to demonstrate predicted increase in functional mobility to complete ADLs                 Plan - 03/26/20 0948    Clinical Impression Statement PT continued therex progression for increased shoulder mobility and strength PT continued manual techniques for increased GHJ and scapular mobility- as patient demonstrates minimal scapular mobility in therex. PT discussed TDN with patient, with patient agreeing to attempt intervention next session. Pt is motivated to comply with all cuing for proper technique of therex with minimal increased pain throughout session. Pt is seeking MRI through [facility], which will be helpful. PT will continue progression as able.    Personal Factors and Comorbidities Comorbidity 1;Comorbidity 2;Comorbidity 3+;Past/Current Experience;Fitness;Time since onset of injury/illness/exacerbation    Comorbidities breast cancer, HTN, GERD, anxiety    Examination-Activity Limitations Bathing;Lift;Transfers;Reach Overhead;Carry;Dressing    Examination-Participation  Restrictions Yard Work;Cleaning;Community Activity;Driving;Occupation;Laundry    Stability/Clinical Decision Making Evolving/Moderate complexity    Clinical Decision Making Moderate    Rehab Potential Good    PT Frequency 2x / week    PT Duration 8 weeks    PT Treatment/Interventions ADLs/Self Care Home Management;Electrical Stimulation;Therapeutic exercise;Balance training;Joint Manipulations;Taping;Spinal Manipulations;Cryotherapy;Iontophoresis 83m/ml Dexamethasone;DME Instruction;Neuromuscular re-education;Gait training;Stair training;Functional mobility training;Moist Heat;Traction;Ultrasound;Therapeutic activities;Patient/family education;Manual techniques;Dry needling;Passive range of motion;Vestibular    PT Next Visit Plan PROM/AAROM    PT Home Exercise Plan pulleys abd/flex    Consulted and Agree with Plan of Care Patient           Patient will benefit from skilled therapeutic intervention in order to improve the following deficits and impairments:  Decreased balance,Impaired flexibility,Impaired UE functional use,Hypomobility,Decreased strength,Decreased range of motion,Decreased endurance,Decreased activity tolerance,Postural dysfunction,Increased muscle spasms,Hypermobility,Decreased mobility,Decreased coordination,Abnormal gait,Pain,Improper body mechanics,Increased fascial restricitons  Visit Diagnosis: Acute pain of left shoulder  Stiffness of left shoulder, not elsewhere classified     Problem List Patient Active Problem List   Diagnosis Date Noted  . Breast asymmetry following reconstructive surgery 03/25/2020  . S/P breast reconstruction, bilateral 03/06/2020  . Sebaceous cyst 12/31/2019  . Breast wound 10/05/2019  . Acquired absence of bilateral breasts and nipples 09/04/2019  . Genetic testing 08/10/2019  . Monoallelic mutation of CHEK2 gene in female patient 08/07/2019  . Malignant neoplasm of upper-outer quadrant of right breast in female, [place]  [place] 07/30[place]  . Goals of care, counseling/discussion 07/30/2019  . Family history of prostate cancer   . Family history of colon cancer   . [place] cancer [place] 07/25/2019  . Left lower quadrant pain 05/23/2018  . Personal history of kidney stones 04/20/2018  . Microscopic hematuria 04/20/2018  . 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 Overweight (BMI 25.0-29.9) 01/22/2017  . Encounter for preventive health examination 06/30/2016  . Renal colic 025/06/3974 . Ureteral calculus 04/12/2015  . Nephrolithiasis 03/04/2015  . Intractable migraine with aura without status migrainosus 10/29/2013  . Lack of libido 06/24/2013  . Migraine syndrome 03/11/2013  . Generalized anxiety disorder 03/11/2013   CDurwin RegesDPT CDurwin Reges2/16/2022, 10:08 AM  [facility] [address]  [place], [place], [address] Phone: [phone]   Fax:  [phone]  Name: Tiffany Acevedo MRN: [id number] Date of Birth: 29-Jun-1965

## 2020-03-27 ENCOUNTER — Ambulatory Visit: Payer: BC Managed Care – PPO | Admitting: Physical Therapy

## 2020-03-27 ENCOUNTER — Other Ambulatory Visit: Payer: Self-pay

## 2020-03-27 ENCOUNTER — Encounter: Payer: Self-pay | Admitting: Physical Therapy

## 2020-03-27 DIAGNOSIS — M25612 Stiffness of left shoulder, not elsewhere classified: Secondary | ICD-10-CM

## 2020-03-27 DIAGNOSIS — M25512 Pain in left shoulder: Secondary | ICD-10-CM

## 2020-03-27 NOTE — Therapy (Signed)
[facility] [address] [place], [address] Phone: [phone]   Fax:  [phone]  Physical Therapy Treatment  Patient Details  Name: Tiffany Acevedo MRN: [id number] Date of Birth: 14-Feb-1965 No data recorded  Encounter Date: 03/27/2020    Past Medical History:  Diagnosis Date  . Allergy   . Anxiety   . Breast cancer ([place])   . Depression   . Excessive weight gain 06/30/2016  . Family history of colon cancer   . Family history of colon cancer   . Family history of prostate cancer   . Frequent headaches   . GERD (gastroesophageal reflux disease)   . History of kidney stones   . Hypertension   . Migraines   . Pre-diabetes     Past Surgical History:  Procedure Laterality Date  . BREAST BIOPSY Right 07/13/2019   stereo biopsy/ x clip/path pending  . BREAST BIOPSY Right 07/13/2019   stereo biopsy/coil clip/ path pending  . BREAST RECONSTRUCTION WITH PLACEMENT OF TISSUE EXPANDER AND FLEX HD (ACELLULAR HYDRATED DERMIS) Bilateral 08/27/2019   Procedure: BILATERAL BREAST RECONSTRUCTION WITH PLACEMENT OF TISSUE EXPANDER AND FLEX HD (ACELLULAR HYDRATED DERMIS);  Surgeon: Wallace Going, DO;  Location: [facility];  Service: Plastics;  Laterality: Bilateral;  . [place], 2004, 2013  . COLONOSCOPY  2015  . CYSTOSCOPY W/ RETROGRADES Right 09/13/2018   Procedure: CYSTOSCOPY WITH RETROGRADE PYELOGRAM;  Surgeon: Abbie Sons, MD;  Location: [facility];  Service: Urology;  Laterality: Right;  . CYSTOSCOPY/URETEROSCOPY/HOLMIUM LASER/STENT PLACEMENT Left 05/09/2018   Procedure: CYSTOSCOPY/URETEROSCOPY/HOLMIUM LASER/STENT PLACEMENT;  Surgeon: Abbie Sons, MD;  Location: [facility];  Service: Urology;  Laterality: Left;  . CYSTOSCOPY/URETEROSCOPY/HOLMIUM LASER/STENT PLACEMENT Right 09/13/2018   Procedure: CYSTOSCOPY/URETEROSCOPY/HOLMIUM LASER/STENT PLACEMENT;  Surgeon: Abbie Sons, MD;   Location: [facility];  Service: Urology;  Laterality: Right;  . INCONTINENCE SURGERY     BLADDER TUCK  . PILONIDAL CYST EXCISION    . REMOVAL OF BILATERAL TISSUE EXPANDERS WITH PLACEMENT OF BILATERAL BREAST IMPLANTS Bilateral 01/02/2020   Procedure: REMOVAL OF BILATERAL TISSUE EXPANDERS WITH PLACEMENT OF BILATERAL BREAST IMPLANTS;  Surgeon: Wallace Going, DO;  Location: [facility];  Service: Plastics;  Laterality: Bilateral;  2 hours, please  . STONE EXTRACTION WITH BASKET Right 09/13/2018   Procedure: STONE EXTRACTION WITH BASKET;  Surgeon: Abbie Sons, MD;  Location: [facility];  Service: Urology;  Laterality: Right;  . TOTAL MASTECTOMY Bilateral 08/27/2019   Procedure: TOTAL MASTECTOMY;  Surgeon: Herbert Pun, MD;  Location: [facility];  Service: General;  Laterality: Bilateral;  START _0  DUE TO SENTINEL NODE  . UPPER GI ENDOSCOPY      There were no vitals filed for this visit.   Subjective Assessment - 03/27/20 1749    Subjective Patient reports no pain on arrival other than soreness post exercise. Is planning to schedule MRI    Pertinent History Pt is a 55 year old female s/p L proximal humerus fracture 12/02/10. PMH double masectomy 08/27/19 with expander placement and reconstruction finished 01/03/20.Pt fell in [place] walking straight forward onto her shoulder. Patient reports fracture was non-displaced and she did not have surgery, by Dr. Adrian Prince advisement, wore sling 6 weeks. Currently does not know orthopedic expectations for weight restrictions/movement resistrictions, but has not been lifting more than 1/2 gallon of milk, but is no longer wearing sling. 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 Patient reports motion is very restricted and she is unable to don/doff clothes, her husband washes her hair for her, unable to push/pull, or lift anything. Pt is an avid golfer, and walks often. Worst shoulder pain  over the past week 8/10 best 0/10 but reports she avoids painful movement.  Pt is R hand dominant. Pt denies N/V, B&B changes, unexplained weight fluctuation, saddle paresthesia, fever, night sweats, or unrelenting night pain at this time.    Limitations Lifting;House hold activities;Writing    How long can you sit comfortably? unlimited    How long can you stand comfortably? unlimited    How long can you walk comfortably? unlimited    Diagnostic tests Xrays [facility] 12/03/19    Patient Stated Goals Full ROM to complete self care    Pain Onset More than a month ago           AROM before/after TDN Flex 95/114 abd 68/97  AAROM following TDN Flex 138d abd 105d  Ther-Ex TRX flex/abd walkouts x12 Total gym "pull ups" from near horizontal position L7 2x 12 with cuing for eccentric lower   Manual PROM to GHJ all directions PROM to scapula all directions STM to UT/levator, midtrap, rhomboids with some reduction in tension Following: Dry Needling: (4) 46m .25 needles placed along the R Biceps to decrease increased muscular spasms and trigger points with the patient positioned in supine. Patient was educated on risks and benefits of therapy and verbally consents to PT.                             PT Education - 03/27/20 1752    Education Details therex form/technique, TDN    Person(s) Educated Patient    Methods Explanation;Demonstration;Verbal cues    Comprehension Verbalized understanding;Returned demonstration;Verbal cues required            PT Short Term Goals - 01/25/20 1002      PT SHORT TERM GOAL #1   Title Pt will be independent with HEP in order to improve strength and decrease pain in order to improve pain-free function at home and work.    Baseline 01/23/20 HEP given    Time 4    Period Weeks    Status New             PT Long Term Goals - 01/25/20 1003      PT LONG TERM GOAL #1   Title Pt will demonstrate full active L  shoulder ROM in order to complete self care and overhead ADLS    Baseline 117/61/60flex 39d abd 39d  ER L ear IR L PSIS    Time 8    Period Weeks    Status New      PT LONG TERM GOAL #2   Title Pt will decrease worst pain as reported on NPRS by at least 3 points in order to demonstrate clinically significant reduction in pain.    Baseline 01/25/20 8/10    Time 8    Period Weeks    Status New      PT LONG TERM GOAL #3   Title Pt will demonstrate L gross shoulder strength of 5/5 in order to demonstrate PLOF and strength needed for heavy household tasks    Baseline 01/23/20 flex 2+ abd 2+ ER 2+ IR 2+    Time 8    Period Weeks    Status New  PT LONG TERM GOAL #4   Title Patient will increase FOTO score to 79 to demonstrate predicted increase in functional mobility to complete ADLs                 Plan - 03/27/20 1752    Clinical Impression Statement PT initiated TDN for increased mobility with good success. Pt responds well to TDN and is able to comply with cuing for exercise technique with greater ROM achieved in AAROM therex. Pt is scheduling MRI, which PT thinks will be helpful to ensure no other barriers to progression. PT will continue progression as able.    Personal Factors and Comorbidities Comorbidity 1;Comorbidity 2;Comorbidity 3+;Past/Current Experience;Fitness;Time since onset of injury/illness/exacerbation    Comorbidities breast cancer, HTN, GERD, anxiety    Examination-Activity Limitations Bathing;Lift;Transfers;Reach Overhead;Carry;Dressing    Examination-Participation Restrictions Yard Work;Cleaning;Community Activity;Driving;Occupation;Laundry    Stability/Clinical Decision Making Evolving/Moderate complexity    Clinical Decision Making Moderate    Rehab Potential Good    PT Frequency 2x / week    PT Duration 8 weeks    PT Treatment/Interventions ADLs/Self Care Home Management;Electrical Stimulation;Therapeutic exercise;Balance training;Joint  Manipulations;Taping;Spinal Manipulations;Cryotherapy;Iontophoresis 14m/ml Dexamethasone;DME Instruction;Neuromuscular re-education;Gait training;Stair training;Functional mobility training;Moist Heat;Traction;Ultrasound;Therapeutic activities;Patient/family education;Manual techniques;Dry needling;Passive range of motion;Vestibular    PT Next Visit Plan PROM/AAROM    PT Home Exercise Plan pulleys abd/flex    Consulted and Agree with Plan of Care Patient           Patient will benefit from skilled therapeutic intervention in order to improve the following deficits and impairments:  Decreased balance,Impaired flexibility,Impaired UE functional use,Hypomobility,Decreased strength,Decreased range of motion,Decreased endurance,Decreased activity tolerance,Postural dysfunction,Increased muscle spasms,Hypermobility,Decreased mobility,Decreased coordination,Abnormal gait,Pain,Improper body mechanics,Increased fascial restricitons  Visit Diagnosis: Acute pain of left shoulder  Stiffness of left shoulder, not elsewhere classified     Problem List Patient Active Problem List   Diagnosis Date Noted  . Breast asymmetry following reconstructive surgery 03/25/2020  . S/P breast reconstruction, bilateral 03/06/2020  . Sebaceous cyst 12/31/2019  . Breast wound 10/05/2019  . Acquired absence of bilateral breasts and nipples 09/04/2019  . Genetic testing 08/10/2019  . Monoallelic mutation of CHEK2 gene in female patient 08/07/2019  . Malignant neoplasm of upper-outer quadrant of right breast in female, estrogen receptor positive [place] 07/30/2019  . Goals of care, counseling/discussion 07/30/2019  . Family history of prostate cancer   . Family history of colon cancer   . Breast cancer [place] 07/25/2019  . Left lower quadrant pain 05/23/2018  . Personal history of kidney stones 04/20/2018  . Microscopic hematuria 04/20/2018  . 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 Overweight (BMI  25.0-29.9) 01/22/2017  . Encounter for preventive health examination 06/30/2016  . Renal colic 080/99/8338 . Ureteral calculus 04/12/2015  . Nephrolithiasis 03/04/2015  . Intractable migraine with aura without status migrainosus 10/29/2013  . Lack of libido 06/24/2013  . Migraine syndrome 03/11/2013  . Generalized anxiety disorder 03/11/2013   CDurwin RegesDPT CDurwin Reges2/17/2022, 5:56 PM  [facility] [address] [place] [address]Phone: [phone]  Fax:  [phone] Name: Tiffany VathMRN: [id number] of Birth: 103/06/1965

## 2020-04-02 ENCOUNTER — Encounter: Payer: Self-pay | Admitting: Physical Therapy

## 2020-04-02 ENCOUNTER — Other Ambulatory Visit: Payer: Self-pay

## 2020-04-02 ENCOUNTER — Ambulatory Visit: Payer: BC Managed Care – PPO | Admitting: Physical Therapy

## 2020-04-02 DIAGNOSIS — M25512 Pain in left shoulder: Secondary | ICD-10-CM

## 2020-04-02 DIAGNOSIS — M25612 Stiffness of left shoulder, not elsewhere classified: Secondary | ICD-10-CM

## 2020-04-02 NOTE — Therapy (Signed)
[place] [facility] [address] [place], [address] Phone: [phone]   Fax:  [phone]  Physical Therapy Treatment  Patient Details  Name: Tiffany Acevedo MRN: [id number] Date of Birth: [birth year] No data recorded  Encounter Date: 04/02/2020   PT End of Session - 04/02/20 0741    Visit Number 16    Number of Visits 17    Date for PT Re-Evaluation 05/16/20    Authorization - Visit Number 42    Authorization - Number of Visits 34    PT Start Time 0734    PT Stop Time 0815    PT Time Calculation (min) 41 min    Activity Tolerance Patient tolerated treatment well    Behavior During Therapy [facility] for tasks assessed/performed           Past Medical History:  Diagnosis Date  . Allergy   . Anxiety   . Breast cancer ([place])   . Depression   . Excessive weight gain 06/30/2016  . Family history of colon cancer   . Family history of colon cancer   . Family history of prostate cancer   . Frequent headaches   . GERD (gastroesophageal reflux disease)   . History of kidney stones   . Hypertension   . Migraines   . Pre-diabetes     Past Surgical History:  Procedure Laterality Date  . BREAST BIOPSY Right 07/13/2019   stereo biopsy/ x clip/path pending  . BREAST BIOPSY Right 07/13/2019   stereo biopsy/coil clip/ path pending  . BREAST RECONSTRUCTION WITH PLACEMENT OF TISSUE EXPANDER AND FLEX HD (ACELLULAR HYDRATED DERMIS) Bilateral 08/27/2019   Procedure: BILATERAL BREAST RECONSTRUCTION WITH PLACEMENT OF TISSUE EXPANDER AND FLEX HD (ACELLULAR HYDRATED DERMIS);  Surgeon: Wallace Going, DO;  Location: [facility];  Service: Plastics;  Laterality: Bilateral;  . [place], 2004, 2013  . COLONOSCOPY  2015  . CYSTOSCOPY W/ RETROGRADES Right 09/13/2018   Procedure: CYSTOSCOPY WITH RETROGRADE PYELOGRAM;  Surgeon: Abbie Sons, MD;  Location: [facility];  Service: Urology;  Laterality: Right;  .  CYSTOSCOPY/URETEROSCOPY/HOLMIUM LASER/STENT PLACEMENT Left 05/09/2018   Procedure: CYSTOSCOPY/URETEROSCOPY/HOLMIUM LASER/STENT PLACEMENT;  Surgeon: Abbie Sons, MD;  Location: [facility];  Service: Urology;  Laterality: Left;  . CYSTOSCOPY/URETEROSCOPY/HOLMIUM LASER/STENT PLACEMENT Right 09/13/2018   Procedure: CYSTOSCOPY/URETEROSCOPY/HOLMIUM LASER/STENT PLACEMENT;  Surgeon: Abbie Sons, MD;  Location: [facility];  Service: Urology;  Laterality: Right;  . INCONTINENCE SURGERY     BLADDER TUCK  . PILONIDAL CYST EXCISION    . REMOVAL OF BILATERAL TISSUE EXPANDERS WITH PLACEMENT OF BILATERAL BREAST IMPLANTS Bilateral 01/02/2020   Procedure: REMOVAL OF BILATERAL TISSUE EXPANDERS WITH PLACEMENT OF BILATERAL BREAST IMPLANTS;  Surgeon: Wallace Going, DO;  Location: [place];  Service: Plastics;  Laterality: Bilateral;  2 hours, please  . STONE EXTRACTION WITH BASKET Right 09/13/2018   Procedure: STONE EXTRACTION WITH BASKET;  Surgeon: Abbie Sons, MD;  Location: [facility];  Service: Urology;  Laterality: Right;  . TOTAL MASTECTOMY Bilateral 08/27/2019   Procedure: TOTAL MASTECTOMY;  Surgeon: Herbert Pun, MD;  Location: [facility];  Service: General;  Laterality: Bilateral;  START @930  DUE TO SENTINEL NODE  . UPPER GI ENDOSCOPY      There were no vitals filed for this visit.   Subjective Assessment - 04/02/20 0740    Subjective Patient reports pain over the weekend under her should blade. Is completing HEP.    Pertinent History  Pt is a 55 year old female s/p L proximal humerus fracture 12/02/10. PMH double masectomy 08/27/19 with expander placement and reconstruction finished 01/03/20.Pt fell in [place] walking straight forward onto her shoulder. Patient reports fracture was non-displaced and she did not have surgery, by Dr. Adrian Prince advisement, wore sling 6 weeks. Currently does not know orthopedic expectations for weight restrictions/movement resistrictions, but has not  been lifting more than 1/2 gallon of milk, but is no longer wearing sling. 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 Patient reports motion is very restricted and she is unable to don/doff clothes, her husband washes her hair for her, unable to push/pull, or lift anything. Pt is an avid golfer, and walks often. Worst shoulder pain over the past week 8/10 best 0/10 but reports she avoids painful movement.  Pt is R hand dominant. Pt denies N/V, B&B changes, unexplained weight fluctuation, saddle paresthesia, fever, night sweats, or unrelenting night pain at this time.    Limitations Lifting;House hold activities;Writing    How long can you sit comfortably? unlimited    How long can you stand comfortably? unlimited    How long can you walk comfortably? unlimited    Diagnostic tests Xrays [facility] 12/03/19    Patient Stated Goals Full ROM to complete self care    Pain Onset More than a month ago              AROM before/after TDN Flex 95/114 abd 68/97  AAROM following TDN Flex 138d abd 105d  Ther-Ex TRX flex/abd walkouts x12 Total gym "pull ups" from near horizontal position L7 x6; L8 2 x6 with cuing for eccentric lower Lat pulldown 15# x10; 20# 2x 8 with min cuing for technique with good carry over  Push up plus from elevated mat 2x8 with min cuing for technique, with difficulty with retraction  Prone row 2# DB2x 8with decent carry over of retraction   Manual PROM to GHJ all directions PROM to scapula all directions STM to UT/levator, midtrap, rhomboids with some reduction in tension               PT Education - 04/02/20 0741    Education Details therex form/technique    Methods Explanation;Tactile cues;Verbal cues    Comprehension Verbalized understanding;Returned demonstration;Verbal cues required            PT Short Term Goals - 01/25/20 1002      PT SHORT TERM GOAL #1   Title Pt will be  independent with HEP in order to improve strength and decrease pain in order to improve pain-free function at home and work.    Baseline 01/23/20 HEP given    Time 4    Period Weeks    Status New             PT Long Term Goals - 01/25/20 1003      PT LONG TERM GOAL #1   Title Pt will demonstrate full active L shoulder ROM in order to complete self care and overhead ADLS    Baseline 03/55/97 flex 39d abd 39d  ER L ear IR L PSIS    Time 8    Period Weeks    Status New      PT LONG TERM GOAL #2   Title Pt will decrease worst pain as reported on NPRS by at least 3 points in order to demonstrate clinically significant reduction in pain.    Baseline 01/25/20 8/10  Time 8    Period Weeks    Status New      PT LONG TERM GOAL #3   Title Pt will demonstrate L gross shoulder strength of 5/5 in order to demonstrate PLOF and strength needed for heavy household tasks    Baseline 01/23/20 flex 2+ abd 2+ ER 2+ IR 2+    Time 8    Period Weeks    Status New      PT LONG TERM GOAL #4   Title Patient will increase FOTO score to 79 to demonstrate predicted increase in functional mobility to complete ADLs                 Plan - 04/02/20 1610    Clinical Impression Statement PT continued therex progression for increased shoulder mobility and strength with success. patient is demonstrating difficulty with scapular mobility d/t increased guarding and muscle tension of periscapular muscles, inhibiting scapulohumeral rhythm for overhead motion. Patient is able to correct this with multimodal cuing somewhat with good motivation throughout session. PT will continue progression as able.    Personal Factors and Comorbidities Comorbidity 1;Comorbidity 2;Comorbidity 3+;Past/Current Experience;Fitness;Time since onset of injury/illness/exacerbation    Comorbidities breast cancer, HTN, GERD, anxiety    Examination-Activity Limitations Bathing;Lift;Transfers;Reach Overhead;Carry;Dressing     Examination-Participation Restrictions Yard Work;Cleaning;Community Activity;Driving;Occupation;Laundry    Stability/Clinical Decision Making Evolving/Moderate complexity    Clinical Decision Making Moderate    Rehab Potential Good    PT Frequency 2x / week    PT Duration 8 weeks    PT Treatment/Interventions ADLs/Self Care Home Management;Electrical Stimulation;Therapeutic exercise;Balance training;Joint Manipulations;Taping;Spinal Manipulations;Cryotherapy;Iontophoresis 49m/ml Dexamethasone;DME Instruction;Neuromuscular re-education;Gait training;Stair training;Functional mobility training;Moist Heat;Traction;Ultrasound;Therapeutic activities;Patient/family education;Manual techniques;Dry needling;Passive range of motion;Vestibular    PT Next Visit Plan PROM/AAROM    PT Home Exercise Plan pulleys abd/flex    Consulted and Agree with Plan of Care Patient           Patient will benefit from skilled therapeutic intervention in order to improve the following deficits and impairments:  Decreased balance,Impaired flexibility,Impaired UE functional use,Hypomobility,Decreased strength,Decreased range of motion,Decreased endurance,Decreased activity tolerance,Postural dysfunction,Increased muscle spasms,Hypermobility,Decreased mobility,Decreased coordination,Abnormal gait,Pain,Improper body mechanics,Increased fascial restricitons  Visit Diagnosis: Acute pain of left shoulder  Stiffness of left shoulder, not elsewhere classified     Problem List Patient Active Problem List   Diagnosis Date Noted  . Breast asymmetry following reconstructive surgery 03/25/2020  . S/P breast reconstruction, bilateral 03/06/2020  . Sebaceous cyst 12/31/2019  . Breast wound 10/05/2019  . Acquired absence of bilateral breasts and nipples 09/04/2019  . Genetic testing 08/10/2019  . Monoallelic mutation of CHEK2 gene in female patient 08/07/2019  . Malignant neoplasm of upper-outer quadrant of right breast in  female, estrogen receptor positive ([place] 07/30/2019  . Goals of care, counseling/discussion 07/30/2019  . Family history of prostate cancer   . Family history of colon cancer   . Breast cancer ([place] 07/25/2019  . Left lower quadrant pain 05/23/2018  . Personal history of kidney stones 04/20/2018  . Microscopic hematuria 04/20/2018  . 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 Overweight (BMI 25.0-29.9) 01/22/2017  . Encounter for preventive health examination 06/30/2016  . Renal colic 096/05/5407 . Ureteral calculus 04/12/2015  . Nephrolithiasis 03/04/2015  . Intractable migraine with aura without status migrainosus 10/29/2013  . Lack of libido 06/24/2013  . Migraine syndrome 03/11/2013  . Generalized anxiety disorder 03/11/2013   CDurwin RegesDPT CDurwin Reges2/23/2022, 10:25 AM  [facility] [address], [place], [address] Phone: [phone]   Fax:  [phone]  Name: Tiffany Acevedo MRN: [id number] Date of Birth: [birth year]

## 2020-04-04 ENCOUNTER — Ambulatory Visit: Payer: BC Managed Care – PPO | Admitting: Physical Therapy

## 2020-04-04 ENCOUNTER — Other Ambulatory Visit: Payer: Self-pay

## 2020-04-04 ENCOUNTER — Encounter: Payer: Self-pay | Admitting: Physical Therapy

## 2020-04-04 DIAGNOSIS — M25512 Pain in left shoulder: Secondary | ICD-10-CM

## 2020-04-04 DIAGNOSIS — M25612 Stiffness of left shoulder, not elsewhere classified: Secondary | ICD-10-CM

## 2020-04-04 NOTE — Therapy (Signed)
[place] [facility] [address] [place], [address] Phone: [phone]   Fax:  [phone]  Physical Therapy Treatment  Patient Details  Name: Tiffany Acevedo MRN: [id number] Date of Birth: July 14, 1965 No data recorded  Encounter Date: 04/04/2020   PT End of Session - 04/04/20 0825    Visit Number 17    Number of Visits 33    Date for PT Re-Evaluation 05/16/20    Authorization - Visit Number 72    Authorization - Number of Visits 34    PT Start Time 0800    PT Stop Time 0840    PT Time Calculation (min) 40 min    Activity Tolerance Patient tolerated treatment well    Behavior During Therapy [facility] for tasks assessed/performed           Past Medical History:  Diagnosis Date  . Allergy   . Anxiety   . Breast cancer ([place])   . Depression   . Excessive weight gain 06/30/2016  . Family history of colon cancer   . Family history of colon cancer   . Family history of prostate cancer   . Frequent headaches   . GERD (gastroesophageal reflux disease)   . History of kidney stones   . Hypertension   . Migraines   . Pre-diabetes     Past Surgical History:  Procedure Laterality Date  . BREAST BIOPSY Right 07/13/2019   stereo biopsy/ x clip/path pending  . BREAST BIOPSY Right 07/13/2019   stereo biopsy/coil clip/ path pending  . BREAST RECONSTRUCTION WITH PLACEMENT OF TISSUE EXPANDER AND FLEX HD (ACELLULAR HYDRATED DERMIS) Bilateral 08/27/2019   Procedure: BILATERAL BREAST RECONSTRUCTION WITH PLACEMENT OF TISSUE EXPANDER AND FLEX HD (ACELLULAR HYDRATED DERMIS);  Surgeon: Wallace Going, DO;  Location: [facility];  Service: Plastics;  Laterality: Bilateral;  . [place], 2004, 2013  . COLONOSCOPY  2015  . CYSTOSCOPY W/ RETROGRADES Right 09/13/2018   Procedure: CYSTOSCOPY WITH RETROGRADE PYELOGRAM;  Surgeon: Abbie Sons, MD;  Location: [facility];  Service: Urology;  Laterality: Right;  .  CYSTOSCOPY/URETEROSCOPY/HOLMIUM LASER/STENT PLACEMENT Left 05/09/2018   Procedure: CYSTOSCOPY/URETEROSCOPY/HOLMIUM LASER/STENT PLACEMENT;  Surgeon: Abbie Sons, MD;  Location: [facility];  Service: Urology;  Laterality: Left;  . CYSTOSCOPY/URETEROSCOPY/HOLMIUM LASER/STENT PLACEMENT Right 09/13/2018   Procedure: CYSTOSCOPY/URETEROSCOPY/HOLMIUM LASER/STENT PLACEMENT;  Surgeon: Abbie Sons, MD;  Location: [facility];  Service: Urology;  Laterality: Right;  . INCONTINENCE SURGERY     BLADDER TUCK  . PILONIDAL CYST EXCISION    . REMOVAL OF BILATERAL TISSUE EXPANDERS WITH PLACEMENT OF BILATERAL BREAST IMPLANTS Bilateral 01/02/2020   Procedure: REMOVAL OF BILATERAL TISSUE EXPANDERS WITH PLACEMENT OF BILATERAL BREAST IMPLANTS;  Surgeon: Wallace Going, DO;  Location: [place];  Service: Plastics;  Laterality: Bilateral;  2 hours, please  . STONE EXTRACTION WITH BASKET Right 09/13/2018   Procedure: STONE EXTRACTION WITH BASKET;  Surgeon: Abbie Sons, MD;  Location: [facility];  Service: Urology;  Laterality: Right;  . TOTAL MASTECTOMY Bilateral 08/27/2019   Procedure: TOTAL MASTECTOMY;  Surgeon: Herbert Pun, MD;  Location: [facility];  Service: General;  Laterality: Bilateral;  START _0  DUE TO SENTINEL NODE  . UPPER GI ENDOSCOPY      There were no vitals filed for this visit.   Subjective Assessment - 04/04/20 0812    Subjective Is awaiting call to schedule MRI. Is able to use stick deoderant now which is an improvement. Completing  HEP. SHe is noticing she has some reduced LUE grip strength with pushing buttons.    Pertinent History Pt is a 55 year old female s/p L proximal humerus fracture 12/02/10. PMH double masectomy 08/27/19 with expander placement and reconstruction finished 01/03/20.Pt fell in [place] walking straight forward onto her shoulder. Patient reports fracture was non-displaced and she did not have surgery, by Dr. Adrian Prince advisement, wore sling 6 weeks.  Currently does not know orthopedic expectations for weight restrictions/movement resistrictions, but has not been lifting more than 1/2 gallon of milk, but is no longer wearing sling. 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 Patient reports motion is very restricted and she is unable to don/doff clothes, her husband washes her hair for her, unable to push/pull, or lift anything. Pt is an avid golfer, and walks often. Worst shoulder pain over the past week 8/10 best 0/10 but reports she avoids painful movement.  Pt is R hand dominant. Pt denies N/V, B&B changes, unexplained weight fluctuation, saddle paresthesia, fever, night sweats, or unrelenting night pain at this time.    Limitations Lifting;House hold activities;Writing    How long can you sit comfortably? unlimited    How long can you stand comfortably? unlimited    How long can you walk comfortably? unlimited    Diagnostic tests Xrays [facility] 12/03/19    Patient Stated Goals Full ROM to complete self care    Pain Onset More than a month ago           AROM before/after TDN Flex 95/114 abd 68/97  AAROM following TDN Flex 138d abd 105d  Ther-Ex TRX flex/abd walkouts x12 Total gym "pull ups" from near horizontal positionL7 x6; L8 2 x6 with cuing for eccentric lower Supine scapular retraction/protraction 2# DB 3x 10 with max cuing initially for technique, difficulty with scapular retraction  Prone T 2x 6 with cuing for scapular retraction with decent carry over Prone row 2# DB2x 8with decent carry over of retraction   Manual PROM to scapula all directions STM to UT/levator, midtrap, rhomboids with some reduction in tension Following Dry Needling: (1/1/2) 75m .25 needles placed along the L UT, L supraspinatus, and L rhomboids/midtrap to decrease increased muscular spasms and trigger points with the patient positioned in prone. Patient was educated on risks and benefits  of therapy and verbally consents to PT.                             PT Education - 04/04/20 0818    Education Details therex form/technique    Person(s) Educated Patient    Methods Explanation;Demonstration;Verbal cues    Comprehension Verbalized understanding;Returned demonstration;Verbal cues required            PT Short Term Goals - 01/25/20 1002      PT SHORT TERM GOAL #1   Title Pt will be independent with HEP in order to improve strength and decrease pain in order to improve pain-free function at home and work.    Baseline 01/23/20 HEP given    Time 4    Period Weeks    Status New             PT Long Term Goals - 01/25/20 1003      PT LONG TERM GOAL #1   Title Pt will demonstrate full active L shoulder ROM in order to complete self care and overhead ADLS  Baseline 01/23/20 flex 39d abd 39d  ER L ear IR L PSIS    Time 8    Period Weeks    Status New      PT LONG TERM GOAL #2   Title Pt will decrease worst pain as reported on NPRS by at least 3 points in order to demonstrate clinically significant reduction in pain.    Baseline 01/25/20 8/10    Time 8    Period Weeks    Status New      PT LONG TERM GOAL #3   Title Pt will demonstrate L gross shoulder strength of 5/5 in order to demonstrate PLOF and strength needed for heavy household tasks    Baseline 01/23/20 flex 2+ abd 2+ ER 2+ IR 2+    Time 8    Period Weeks    Status New      PT LONG TERM GOAL #4   Title Patient will increase FOTO score to 79 to demonstrate predicted increase in functional mobility to complete ADLs                 Plan - 04/04/20 2423    Clinical Impression Statement PT continued therex progression for increased shoulder mobility and strength with success. PT utilized TDN of periscapular musculature to increase scapular mobility with success. Pt is making slow steady progress toward strength and mobility goals, but will still benefit from MRI imaging to  assess soft tissue. PT will continue progression as able.    Personal Factors and Comorbidities Comorbidity 1;Comorbidity 2;Comorbidity 3+;Past/Current Experience;Fitness;Time since onset of injury/illness/exacerbation    Comorbidities breast cancer, HTN, GERD, anxiety    Examination-Activity Limitations Bathing;Lift;Transfers;Reach Overhead;Carry;Dressing    Examination-Participation Restrictions Yard Work;Cleaning;Community Activity;Driving;Occupation;Laundry    Stability/Clinical Decision Making Evolving/Moderate complexity    Clinical Decision Making Moderate    Rehab Potential Good    PT Frequency 2x / week    PT Duration 8 weeks    PT Treatment/Interventions ADLs/Self Care Home Management;Electrical Stimulation;Therapeutic exercise;Balance training;Joint Manipulations;Taping;Spinal Manipulations;Cryotherapy;Iontophoresis 20m/ml Dexamethasone;DME Instruction;Neuromuscular re-education;Gait training;Stair training;Functional mobility training;Moist Heat;Traction;Ultrasound;Therapeutic activities;Patient/family education;Manual techniques;Dry needling;Passive range of motion;Vestibular    PT Next Visit Plan PROM/AAROM    PT Home Exercise Plan pulleys abd/flex    Consulted and Agree with Plan of Care Patient           Patient will benefit from skilled therapeutic intervention in order to improve the following deficits and impairments:  Decreased balance,Impaired flexibility,Impaired UE functional use,Hypomobility,Decreased strength,Decreased range of motion,Decreased endurance,Decreased activity tolerance,Postural dysfunction,Increased muscle spasms,Hypermobility,Decreased mobility,Decreased coordination,Abnormal gait,Pain,Improper body mechanics,Increased fascial restricitons  Visit Diagnosis: Acute pain of left shoulder  Stiffness of left shoulder, not elsewhere classified     Problem List Patient Active Problem List   Diagnosis Date Noted  . Breast asymmetry following  reconstructive surgery 03/25/2020  . S/P breast reconstruction, bilateral 03/06/2020  . Sebaceous cyst 12/31/2019  . Breast wound 10/05/2019  . Acquired absence of bilateral breasts and nipples 09/04/2019  . Genetic testing 08/10/2019  . Monoallelic mutation of CHEK2 gene in female patient 08/07/2019  . Malignant neoplasm of upper-outer quadrant of right breast in female, estrogen receptor positive ([place] 07/30/2019  . Goals of care, counseling/discussion 07/30/2019  . Family history of prostate cancer   . Family history of colon cancer   . Breast cancer ([place] 07/25/2019  . Left lower quadrant pain 05/23/2018  . Personal history of kidney stones 04/20/2018  . Microscopic hematuria 04/20/2018  . 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  Overweight (BMI 25.0-29.9) 01/22/2017  . Encounter for preventive health examination 06/30/2016  . Renal colic 54/98/2641  . Ureteral calculus 04/12/2015  . Nephrolithiasis 03/04/2015  . Intractable migraine with aura without status migrainosus 10/29/2013  . Lack of libido 06/24/2013  . Migraine syndrome 03/11/2013  . Generalized anxiety disorder 03/11/2013   Durwin Reges DPT Durwin Reges 04/04/2020, 9:05 AM  [place] [facility] [address], [place], [address] Phone: [phone]   Fax:  [phone]  Name: Shandell Giovanni MRN: [id number] Date of Birth: [birth year]

## 2020-04-04 NOTE — Addendum Note (Signed)
Addended by: Kelton Pillar on: 04/04/2020 08:31 AM   Modules accepted: Orders

## 2020-04-04 NOTE — Addendum Note (Signed)
Addended by: Kelton Pillar on: 04/04/2020 09:11 AM   Modules accepted: Orders

## 2020-04-07 ENCOUNTER — Ambulatory Visit: Payer: BC Managed Care – PPO | Admitting: Physical Therapy

## 2020-04-07 ENCOUNTER — Other Ambulatory Visit: Payer: Self-pay

## 2020-04-07 ENCOUNTER — Encounter: Payer: Self-pay | Admitting: Physical Therapy

## 2020-04-07 DIAGNOSIS — M25512 Pain in left shoulder: Secondary | ICD-10-CM

## 2020-04-07 DIAGNOSIS — M25612 Stiffness of left shoulder, not elsewhere classified: Secondary | ICD-10-CM

## 2020-04-07 NOTE — Therapy (Signed)
[facility] [address] [place], [address] Phone: [phone]   Fax:  [phone]  Physical Therapy Treatment  Patient Details  Name: Tiffany Acevedo MRN: [id number] Date of Birth: Apr 14, 1965 No data recorded  Encounter Date: 04/07/2020   PT End of Session - 04/07/20 0745    Visit Number 18    Number of Visits 33    Date for PT Re-Evaluation 05/16/20    Authorization - Visit Number 18    Authorization - Number of Visits 20    PT Start Time 0736    PT Stop Time 0815    PT Time Calculation (min) 39 min    Activity Tolerance Patient tolerated treatment well    Behavior During Therapy [facility] for tasks assessed/performed           Past Medical History:  Diagnosis Date  . Allergy   . Anxiety   . Breast cancer ([place])   . Depression   . Excessive weight gain 06/30/2016  . Family history of colon cancer   . Family history of colon cancer   . Family history of prostate cancer   . Frequent headaches   . GERD (gastroesophageal reflux disease)   . History of kidney stones   . Hypertension   . Migraines   . Pre-diabetes     Past Surgical History:  Procedure Laterality Date  . BREAST BIOPSY Right 07/13/2019   stereo biopsy/ x clip/path pending  . BREAST BIOPSY Right 07/13/2019   stereo biopsy/coil clip/ path pending  . BREAST RECONSTRUCTION WITH PLACEMENT OF TISSUE EXPANDER AND FLEX HD (ACELLULAR HYDRATED DERMIS) Bilateral 08/27/2019   Procedure: BILATERAL BREAST RECONSTRUCTION WITH PLACEMENT OF TISSUE EXPANDER AND FLEX HD (ACELLULAR HYDRATED DERMIS);  Surgeon: Wallace Going, DO;  Location: [facility];  Service: Plastics;  Laterality: Bilateral;  . [place], 2004, 2013  . COLONOSCOPY  2015  . CYSTOSCOPY W/ RETROGRADES Right 09/13/2018   Procedure: CYSTOSCOPY WITH RETROGRADE PYELOGRAM;  Surgeon: Abbie Sons, MD;  Location: [facility];  Service: Urology;  Laterality: Right;  .  CYSTOSCOPY/URETEROSCOPY/HOLMIUM LASER/STENT PLACEMENT Left 05/09/2018   Procedure: CYSTOSCOPY/URETEROSCOPY/HOLMIUM LASER/STENT PLACEMENT;  Surgeon: Abbie Sons, MD;  Location: [facility];  Service: Urology;  Laterality: Left;  . CYSTOSCOPY/URETEROSCOPY/HOLMIUM LASER/STENT PLACEMENT Right 09/13/2018   Procedure: CYSTOSCOPY/URETEROSCOPY/HOLMIUM LASER/STENT PLACEMENT;  Surgeon: Abbie Sons, MD;  Location: [facility];  Service: Urology;  Laterality: Right;  . INCONTINENCE SURGERY     BLADDER TUCK  . PILONIDAL CYST EXCISION    . REMOVAL OF BILATERAL TISSUE EXPANDERS WITH PLACEMENT OF BILATERAL BREAST IMPLANTS Bilateral 01/02/2020   Procedure: REMOVAL OF BILATERAL TISSUE EXPANDERS WITH PLACEMENT OF BILATERAL BREAST IMPLANTS;  Surgeon: Wallace Going, DO;  Location: [place];  Service: Plastics;  Laterality: Bilateral;  2 hours, please  . STONE EXTRACTION WITH BASKET Right 09/13/2018   Procedure: STONE EXTRACTION WITH BASKET;  Surgeon: Abbie Sons, MD;  Location: [facility];  Service: Urology;  Laterality: Right;  . TOTAL MASTECTOMY Bilateral 08/27/2019   Procedure: TOTAL MASTECTOMY;  Surgeon: Herbert Pun, MD;  Location: [facility];  Service: General;  Laterality: Bilateral;  START @930  DUE TO SENTINEL NODE  . UPPER GI ENDOSCOPY      There were no vitals filed for this visit.   Subjective Assessment - 04/07/20 0741    Subjective Reports that she was sore following TDN, not sure if it is helping. No pain on arrival. Trying  to complete HEP    Pertinent History Pt is a 55 year old female s/p L proximal humerus fracture 12/02/10. PMH double masectomy 08/27/19 with expander placement and reconstruction finished 01/03/20.Pt fell in [place] walking straight forward onto her shoulder. Patient reports fracture was non-displaced and she did not have surgery, by Dr. Adrian Prince advisement, wore sling 6 weeks. Currently does not know orthopedic expectations for weight  restrictions/movement resistrictions, but has not been lifting more than 1/2 gallon of milk, but is no longer wearing sling. 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 Patient reports motion is very restricted and she is unable to don/doff clothes, her husband washes her hair for her, unable to push/pull, or lift anything. Pt is an avid golfer, and walks often. Worst shoulder pain over the past week 8/10 best 0/10 but reports she avoids painful movement.  Pt is R hand dominant. Pt denies N/V, B&B changes, unexplained weight fluctuation, saddle paresthesia, fever, night sweats, or unrelenting night pain at this time.    Limitations Lifting;House hold activities;Writing    How long can you sit comfortably? unlimited    How long can you stand comfortably? unlimited    How long can you walk comfortably? unlimited    Diagnostic tests Xrays [facility] 12/03/19    Patient Stated Goals Full ROM to complete self care    Pain Onset More than a month ago             Ther-Ex TRX flex/abd walkouts x12 Total gym "pull ups" from near horizontal position L8 2 x6L9 x6with cuing for eccentric lower High row BluTB 3x 10 with min cuing for  Prone row 4# 3x 10/9/8 with min cuing for scapular retraction  Scaption RTB 3x 8/7/6 with min cuing for technique with good carry over  Review of the following HEP: PT reviewed the following HEP with patient with patient able to demonstrate or verbalize understadning of the following with min cuing for correction needed. PT educated patient on parameters of therex (how/when to inc/decrease intensity, frequency, rep/set range, stretch hold time, and purpose of therex) with verbalized understanding.  Access Code: [email or web address] Seated Shoulder Flexion AAROM with Pulley Behind - 1-2 x daily - 7 x weekly - 12 reps - 3-5sec hold Seated Shoulder Abduction AAROM with Pulley Behind - 1-2 x daily - 7 x weekly - 12 reps - 3-5  hold Standing Shoulder Flexion Wall Walk - 1 x daily - 7 x weekly - 3 sets - 12 reps - 3-5 hold Standing High Row with Resistance - 1 x daily - 2-3 x weekly - 3 sets - 10 reps Prone Shoulder Row - 1 x daily - 2-3 x weekly - 3 sets - 10 reps Prone Scapular Retraction Arms at Side - 1 x daily - 2-3 x weekly - 3 sets - 6-10 reps Low Trap Setting at Wall - 1 x daily - 2-3 x weekly - 3 sets - 6-10 reps Standing Shoulder Scaption - 1 x daily - 2-3 x weekly - 3 sets - 6-10 reps   Manual  PROM all directions   PROM Flex: 130d  Abd 100 IR 66 ER 31  AROM Flex: 125d  Abd 88 IR L4 ER C6          PT Education - 04/07/20 0744    Education Details therex form/technique    Person(s) Educated Patient    Methods Explanation;Demonstration;Verbal cues    Comprehension Verbalized understanding;Returned demonstration;Verbal  cues required            PT Short Term Goals - 01/25/20 1002      PT SHORT TERM GOAL #1   Title Pt will be independent with HEP in order to improve strength and decrease pain in order to improve pain-free function at home and work.    Baseline 01/23/20 HEP given    Time 4    Period Weeks    Status New             PT Long Term Goals - 01/25/20 1003      PT LONG TERM GOAL #1   Title Pt will demonstrate full active L shoulder ROM in order to complete self care and overhead ADLS    Baseline 61/44/31 flex 39d abd 39d  ER L ear IR L PSIS    Time 8    Period Weeks    Status New      PT LONG TERM GOAL #2   Title Pt will decrease worst pain as reported on NPRS by at least 3 points in order to demonstrate clinically significant reduction in pain.    Baseline 01/25/20 8/10    Time 8    Period Weeks    Status New      PT LONG TERM GOAL #3   Title Pt will demonstrate L gross shoulder strength of 5/5 in order to demonstrate PLOF and strength needed for heavy household tasks    Baseline 01/23/20 flex 2+ abd 2+ ER 2+ IR 2+    Time 8    Period Weeks     Status New      PT LONG TERM GOAL #4   Title Patient will increase FOTO score to 79 to demonstrate predicted increase in functional mobility to complete ADLs                 Plan - 04/07/20 0754    Clinical Impression Statement PT continued therex progression for increased strength and overhead mobility with success. Patient is able to comply with all cuing for proper technique of therex following multimodal cuing with good motivation throughout session. Patient continues to make slow, steady progress toward goals. PT will continue progressiona as able.    Personal Factors and Comorbidities Comorbidity 1;Comorbidity 2;Comorbidity 3+;Past/Current Experience;Fitness;Time since onset of injury/illness/exacerbation    Comorbidities breast cancer, HTN, GERD, anxiety    Examination-Activity Limitations Bathing;Lift;Transfers;Reach Overhead;Carry;Dressing    Examination-Participation Restrictions Yard Work;Cleaning;Community Activity;Driving;Occupation;Laundry    Stability/Clinical Decision Making Evolving/Moderate complexity    Clinical Decision Making Moderate    Rehab Potential Good    PT Frequency 2x / week    PT Duration 8 weeks    PT Treatment/Interventions ADLs/Self Care Home Management;Electrical Stimulation;Therapeutic exercise;Balance training;Joint Manipulations;Taping;Spinal Manipulations;Cryotherapy;Iontophoresis 72m/ml Dexamethasone;DME Instruction;Neuromuscular re-education;Gait training;Stair training;Functional mobility training;Moist Heat;Traction;Ultrasound;Therapeutic activities;Patient/family education;Manual techniques;Dry needling;Passive range of motion;Vestibular    PT Next Visit Plan PROM/AAROM    PT Home Exercise Plan see 04/07/20 note    Consulted and Agree with Plan of Care Patient           Patient will benefit from skilled therapeutic intervention in order to improve the following deficits and impairments:  Decreased balance,Impaired flexibility,Impaired UE  functional use,Hypomobility,Decreased strength,Decreased range of motion,Decreased endurance,Decreased activity tolerance,Postural dysfunction,Increased muscle spasms,Hypermobility,Decreased mobility,Decreased coordination,Abnormal gait,Pain,Improper body mechanics,Increased fascial restricitons  Visit Diagnosis: Acute pain of left shoulder  Stiffness of left shoulder, not elsewhere classified     Problem List Patient Active Problem List   Diagnosis Date Noted  .  Breast asymmetry following reconstructive surgery 03/25/2020  . S/P breast reconstruction, bilateral 03/06/2020  . Sebaceous cyst 12/31/2019  . Breast wound 10/05/2019  . Acquired absence of bilateral breasts and nipples 09/04/2019  . Genetic testing 08/10/2019  . Monoallelic mutation of CHEK2 gene in female patient 08/07/2019  . Malignant neoplasm of upper-outer quadrant of right breast in female, estrogen receptor positive ([place]) 07/30/2019  . Goals of care, counseling/discussion 07/30/2019  . Family history of prostate cancer   . Family history of colon cancer   . Breast cancer ([place]) 07/25/2019  . Left lower quadrant pain 05/23/2018  . Personal history of kidney stones 04/20/2018  . Microscopic hematuria 04/20/2018  . 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 Overweight (BMI 25.0-29.9) 01/22/2017  . Encounter for preventive health examination 06/30/2016  . Renal colic 93/71/6967  . Ureteral calculus 04/12/2015  . Nephrolithiasis 03/04/2015  . Intractable migraine with aura without status migrainosus 10/29/2013  . Lack of libido 06/24/2013  . Migraine syndrome 03/11/2013  . Generalized anxiety disorder 03/11/2013   Durwin Reges DPT Durwin Reges 04/07/2020, 1:37 PM  [facility] [address], [place], [address] Phone: [phone]   Fax:  [phone]  Name: Ryian Lynde MRN: [id number] Date of Birth:  [birth year]

## 2020-04-09 ENCOUNTER — Other Ambulatory Visit: Payer: Self-pay

## 2020-04-09 ENCOUNTER — Ambulatory Visit: Payer: BC Managed Care – PPO | Attending: Oncology | Admitting: Physical Therapy

## 2020-04-09 DIAGNOSIS — M25512 Pain in left shoulder: Secondary | ICD-10-CM | POA: Insufficient documentation

## 2020-04-09 DIAGNOSIS — M25612 Stiffness of left shoulder, not elsewhere classified: Secondary | ICD-10-CM | POA: Diagnosis present

## 2020-04-09 NOTE — Therapy (Signed)
[facility] [address], [place], [address] Phone: [phone]   Fax:  [phone]  Physical Therapy Treatment  Patient Details  Name: Tiffany Acevedo MRN: [id number] Date of Birth: 04/23/1965 No data recorded  Encounter Date: 04/09/2020   PT End of Session - 04/09/20 1019    Visit Number 19    Number of Visits 33    Date for PT Re-Evaluation 05/16/20    Authorization - Visit Number 41    Authorization - Number of Visits 20    PT Start Time 0905    PT Stop Time 0945    PT Time Calculation (min) 40 min    Activity Tolerance Patient tolerated treatment well    Behavior During Therapy [facility] for tasks assessed/performed           Past Medical History:  Diagnosis Date  . Allergy   . Anxiety   . Breast cancer ([place])   . Depression   . Excessive weight gain 06/30/2016  . Family history of colon cancer   . Family history of colon cancer   . Family history of prostate cancer   . Frequent headaches   . GERD (gastroesophageal reflux disease)   . History of kidney stones   . Hypertension   . Migraines   . Pre-diabetes     Past Surgical History:  Procedure Laterality Date  . BREAST BIOPSY Right 07/13/2019   stereo biopsy/ x clip/path pending  . BREAST BIOPSY Right 07/13/2019   stereo biopsy/coil clip/ path pending  . BREAST RECONSTRUCTION WITH PLACEMENT OF TISSUE EXPANDER AND FLEX HD (ACELLULAR HYDRATED DERMIS) Bilateral 08/27/2019   Procedure: BILATERAL BREAST RECONSTRUCTION WITH PLACEMENT OF TISSUE EXPANDER AND FLEX HD (ACELLULAR HYDRATED DERMIS);  Surgeon: Wallace Going, DO;  Location: [facility];  Service: Plastics;  Laterality: Bilateral;  . [place], 2004, 2013  . COLONOSCOPY  2015  . CYSTOSCOPY W/ RETROGRADES Right 09/13/2018   Procedure: CYSTOSCOPY WITH RETROGRADE PYELOGRAM;  Surgeon: Abbie Sons, MD;  Location: [facility];  Service: Urology;  Laterality: Right;  .  CYSTOSCOPY/URETEROSCOPY/HOLMIUM LASER/STENT PLACEMENT Left 05/09/2018   Procedure: CYSTOSCOPY/URETEROSCOPY/HOLMIUM LASER/STENT PLACEMENT;  Surgeon: Abbie Sons, MD;  Location: [facility];  Service: Urology;  Laterality: Left;  . CYSTOSCOPY/URETEROSCOPY/HOLMIUM LASER/STENT PLACEMENT Right 09/13/2018   Procedure: CYSTOSCOPY/URETEROSCOPY/HOLMIUM LASER/STENT PLACEMENT;  Surgeon: Abbie Sons, MD;  Location: [facility];  Service: Urology;  Laterality: Right;  . INCONTINENCE SURGERY     BLADDER TUCK  . PILONIDAL CYST EXCISION    . REMOVAL OF BILATERAL TISSUE EXPANDERS WITH PLACEMENT OF BILATERAL BREAST IMPLANTS Bilateral 01/02/2020   Procedure: REMOVAL OF BILATERAL TISSUE EXPANDERS WITH PLACEMENT OF BILATERAL BREAST IMPLANTS;  Surgeon: Wallace Going, DO;  Location: [place];  Service: Plastics;  Laterality: Bilateral;  2 hours, please  . STONE EXTRACTION WITH BASKET Right 09/13/2018   Procedure: STONE EXTRACTION WITH BASKET;  Surgeon: Abbie Sons, MD;  Location: [facility];  Service: Urology;  Laterality: Right;  . TOTAL MASTECTOMY Bilateral 08/27/2019   Procedure: TOTAL MASTECTOMY;  Surgeon: Herbert Pun, MD;  Location: [facility];  Service: General;  Laterality: Bilateral;  START @930  DUE TO SENTINEL NODE  . UPPER GI ENDOSCOPY      There were no vitals filed for this visit.   Subjective Assessment - 04/09/20 1018    Subjective Patient reports she has some soreness along her shoulder blade. NO pain on arrival. Has MRI scheduled for  next week.    Pertinent History Pt is a 55 year old female s/p L proximal humerus fracture 12/02/10. PMH double masectomy 08/27/19 with expander placement and reconstruction finished 01/03/20.Pt fell in [place] walking straight forward onto her shoulder. Patient reports fracture was non-displaced and she did not have surgery, by Dr. Adrian Prince advisement, wore sling 6 weeks. Currently does not know orthopedic expectations for weight  restrictions/movement resistrictions, but has not been lifting more than 1/2 gallon of milk, but is no longer wearing sling. 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 Patient reports motion is very restricted and she is unable to don/doff clothes, her husband washes her hair for her, unable to push/pull, or lift anything. Pt is an avid golfer, and walks often. Worst shoulder pain over the past week 8/10 best 0/10 but reports she avoids painful movement.  Pt is R hand dominant. Pt denies N/V, B&B changes, unexplained weight fluctuation, saddle paresthesia, fever, night sweats, or unrelenting night pain at this time.    Limitations Lifting;House hold activities;Writing    How long can you sit comfortably? unlimited    How long can you stand comfortably? unlimited    How long can you walk comfortably? unlimited    Diagnostic tests Xrays [facility] 12/03/19    Patient Stated Goals Full ROM to complete self care    Pain Onset More than a month ago           Ther-Ex TRX flex/abd walkouts x12 Total gym "pull ups" L8 x6L9 2 x6 with cuing for eccentric lower Prone T off table 3x 6 with cuing for eccentric control Prone row 4# 3x 10 with min cuing for scapular retraction   Manual STM with trigger point release to mid/lower trap fibers, and latissimus PROM all directions            PT Education - 04/09/20 1019    Education Details therex form/technique    Person(s) Educated Patient    Methods Explanation;Demonstration;Verbal cues    Comprehension Verbalized understanding;Returned demonstration;Verbal cues required            PT Short Term Goals - 01/25/20 1002      PT SHORT TERM GOAL #1   Title Pt will be independent with HEP in order to improve strength and decrease pain in order to improve pain-free function at home and work.    Baseline 01/23/20 HEP given    Time 4    Period Weeks    Status New             PT  Long Term Goals - 01/25/20 1003      PT LONG TERM GOAL #1   Title Pt will demonstrate full active L shoulder ROM in order to complete self care and overhead ADLS    Baseline 09/81/19 flex 39d abd 39d  ER L ear IR L PSIS    Time 8    Period Weeks    Status New      PT LONG TERM GOAL #2   Title Pt will decrease worst pain as reported on NPRS by at least 3 points in order to demonstrate clinically significant reduction in pain.    Baseline 01/25/20 8/10    Time 8    Period Weeks    Status New      PT LONG TERM GOAL #3   Title Pt will demonstrate L gross shoulder strength of 5/5 in order to demonstrate PLOF and  strength needed for heavy household tasks    Baseline 01/23/20 flex 2+ abd 2+ ER 2+ IR 2+    Time 8    Period Weeks    Status New      PT LONG TERM GOAL #4   Title Patient will increase FOTO score to 79 to demonstrate predicted increase in functional mobility to complete ADLs                 Plan - 04/09/20 1020    Clinical Impression Statement PT continued therex progression for increased periscapular strengthening and mobility with success. patient with difficulty with eccentricc control from availible mobility, with tolerable increase in pain with this. Patient is making slow progress with mobility, but continues to demonstrate progress especially with AAROM/PROM. Pt is able to tolerate therex progression with good carry over of all cuing for proper technique with good motivation throughout session. PT will continue progression as able.    Personal Factors and Comorbidities Comorbidity 1;Comorbidity 2;Comorbidity 3+;Past/Current Experience;Fitness;Time since onset of injury/illness/exacerbation    Comorbidities breast cancer, HTN, GERD, anxiety    Examination-Activity Limitations Bathing;Lift;Transfers;Reach Overhead;Carry;Dressing    Examination-Participation Restrictions Yard Work;Cleaning;Community Activity;Driving;Occupation;Laundry    Stability/Clinical Decision  Making Evolving/Moderate complexity    Clinical Decision Making Moderate    Rehab Potential Good    PT Frequency 2x / week    PT Duration 8 weeks    PT Treatment/Interventions ADLs/Self Care Home Management;Electrical Stimulation;Therapeutic exercise;Balance training;Joint Manipulations;Taping;Spinal Manipulations;Cryotherapy;Iontophoresis 29m/ml Dexamethasone;DME Instruction;Neuromuscular re-education;Gait training;Stair training;Functional mobility training;Moist Heat;Traction;Ultrasound;Therapeutic activities;Patient/family education;Manual techniques;Dry needling;Passive range of motion;Vestibular    PT Next Visit Plan PROM/AAROM    PT Home Exercise Plan see 04/07/20 note    Consulted and Agree with Plan of Care Patient           Patient will benefit from skilled therapeutic intervention in order to improve the following deficits and impairments:  Decreased balance,Impaired flexibility,Impaired UE functional use,Hypomobility,Decreased strength,Decreased range of motion,Decreased endurance,Decreased activity tolerance,Postural dysfunction,Increased muscle spasms,Hypermobility,Decreased mobility,Decreased coordination,Abnormal gait,Pain,Improper body mechanics,Increased fascial restricitons  Visit Diagnosis: Acute pain of left shoulder  Stiffness of left shoulder, not elsewhere classified     Problem List Patient Active Problem List   Diagnosis Date Noted  . Breast asymmetry following reconstructive surgery 03/25/2020  . S/P breast reconstruction, bilateral 03/06/2020  . Sebaceous cyst 12/31/2019  . Breast wound 10/05/2019  . Acquired absence of bilateral breasts and nipples 09/04/2019  . Genetic testing 08/10/2019  . Monoallelic mutation of CHEK2 gene in female patient 08/07/2019  . Malignant neoplasm of upper-outer quadrant of right breast in female, estrogen receptor positive ([place] 07/30/2019  . Goals of care, counseling/discussion 07/30/2019  . Family history of prostate  cancer   . Family history of colon cancer   . Breast cancer ([place] 07/25/2019  . Left lower quadrant pain 05/23/2018  . Personal history of kidney stones 04/20/2018  . Microscopic hematuria 04/20/2018  . 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 Overweight (BMI 25.0-29.9) 01/22/2017  . Encounter for preventive health examination 06/30/2016  . Renal colic 071/07/2692 . Ureteral calculus 04/12/2015  . Nephrolithiasis 03/04/2015  . Intractable migraine with aura without status migrainosus 10/29/2013  . Lack of libido 06/24/2013  . Migraine syndrome 03/11/2013  . Generalized anxiety disorder 03/11/2013   CDurwin RegesDPT CDurwin Reges3/03/2020, 10:26 AM  [facility] [address] [place] [address]Phone: [phone]  Fax:  [phone] Name: TJumanah Hynson  [place] MRN: [id number] Date of Birth: 1965/03/25

## 2020-04-15 ENCOUNTER — Ambulatory Visit: Payer: BC Managed Care – PPO | Admitting: Physical Therapy

## 2020-04-15 ENCOUNTER — Other Ambulatory Visit: Payer: Self-pay

## 2020-04-15 ENCOUNTER — Encounter: Payer: Self-pay | Admitting: Physical Therapy

## 2020-04-15 DIAGNOSIS — M25612 Stiffness of left shoulder, not elsewhere classified: Secondary | ICD-10-CM

## 2020-04-15 DIAGNOSIS — M25512 Pain in left shoulder: Secondary | ICD-10-CM

## 2020-04-15 NOTE — Therapy (Signed)
[facility] [address] [place], [address] Phone: [phone]   Fax:  [phone]  Physical Therapy Treatment  Patient Details  Name: Tiffany Acevedo MRN: [id number] Date of Birth: [birth year] No data recorded  Encounter Date: 04/15/2020   PT End of Session - 04/15/20 0900    Visit Number 20    Number of Visits 33    Date for PT Re-Evaluation 05/16/20    Authorization - Visit Number 33    Authorization - Number of Visits 20    PT Start Time 0815    PT Stop Time 0845    PT Time Calculation (min) 30 min    Activity Tolerance Patient tolerated treatment well    Behavior During Therapy [facility] for tasks assessed/performed           Past Medical History:  Diagnosis Date  . Allergy   . Anxiety   . Breast cancer ([place])   . Depression   . Excessive weight gain 06/30/2016  . Family history of colon cancer   . Family history of colon cancer   . Family history of prostate cancer   . Frequent headaches   . GERD (gastroesophageal reflux disease)   . History of kidney stones   . Hypertension   . Migraines   . Pre-diabetes     Past Surgical History:  Procedure Laterality Date  . BREAST BIOPSY Right 07/13/2019   stereo biopsy/ x clip/path pending  . BREAST BIOPSY Right 07/13/2019   stereo biopsy/coil clip/ path pending  . BREAST RECONSTRUCTION WITH PLACEMENT OF TISSUE EXPANDER AND FLEX HD (ACELLULAR HYDRATED DERMIS) Bilateral 08/27/2019   Procedure: BILATERAL BREAST RECONSTRUCTION WITH PLACEMENT OF TISSUE EXPANDER AND FLEX HD (ACELLULAR HYDRATED DERMIS);  Surgeon: Wallace Going, DO;  Location: [facility];  Service: Plastics;  Laterality: Bilateral;  . [place], 2004, 2013  . COLONOSCOPY  2015  . CYSTOSCOPY W/ RETROGRADES Right 09/13/2018   Procedure: CYSTOSCOPY WITH RETROGRADE PYELOGRAM;  Surgeon: Abbie Sons, MD;  Location: [facility];  Service: Urology;  Laterality: Right;  .  CYSTOSCOPY/URETEROSCOPY/HOLMIUM LASER/STENT PLACEMENT Left 05/09/2018   Procedure: CYSTOSCOPY/URETEROSCOPY/HOLMIUM LASER/STENT PLACEMENT;  Surgeon: Abbie Sons, MD;  Location: [facility];  Service: Urology;  Laterality: Left;  . CYSTOSCOPY/URETEROSCOPY/HOLMIUM LASER/STENT PLACEMENT Right 09/13/2018   Procedure: CYSTOSCOPY/URETEROSCOPY/HOLMIUM LASER/STENT PLACEMENT;  Surgeon: Abbie Sons, MD;  Location: [facility];  Service: Urology;  Laterality: Right;  . INCONTINENCE SURGERY     BLADDER TUCK  . PILONIDAL CYST EXCISION    . REMOVAL OF BILATERAL TISSUE EXPANDERS WITH PLACEMENT OF BILATERAL BREAST IMPLANTS Bilateral 01/02/2020   Procedure: REMOVAL OF BILATERAL TISSUE EXPANDERS WITH PLACEMENT OF BILATERAL BREAST IMPLANTS;  Surgeon: Wallace Going, DO;  Location: [facility];  Service: Plastics;  Laterality: Bilateral;  2 hours, please  . STONE EXTRACTION WITH BASKET Right 09/13/2018   Procedure: STONE EXTRACTION WITH BASKET;  Surgeon: Abbie Sons, MD;  Location: [facility];  Service: Urology;  Laterality: Right;  . TOTAL MASTECTOMY Bilateral 08/27/2019   Procedure: TOTAL MASTECTOMY;  Surgeon: Herbert Pun, MD;  Location: [facility];  Service: General;  Laterality: Bilateral;  START @930  DUE TO SENTINEL NODE  . UPPER GI ENDOSCOPY      There were no vitals filed for this visit.   Subjective Assessment - 04/15/20 0821    Subjective Patient reports no pain in the shoulder, was discouraged this am when she couldn't reach the envelope deposit  at the bank. Has MRI scheduled tomorrow.    Pertinent History Pt is a 55 year old female s/p L proximal humerus fracture 12/02/10. PMH double masectomy 08/27/19 with expander placement and reconstruction finished 01/03/20.Pt fell in [place] walking straight forward onto her shoulder. Patient reports fracture was non-displaced and she did not have surgery, by Dr. Adrian Prince advisement, wore sling 6 weeks. Currently does not know orthopedic  expectations for weight restrictions/movement resistrictions, but has not been lifting more than 1/2 gallon of milk, but is no longer wearing sling. 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 Patient reports motion is very restricted and she is unable to don/doff clothes, her husband washes her hair for her, unable to push/pull, or lift anything. Pt is an avid golfer, and walks often. Worst shoulder pain over the past week 8/10 best 0/10 but reports she avoids painful movement.  Pt is R hand dominant. Pt denies N/V, B&B changes, unexplained weight fluctuation, saddle paresthesia, fever, night sweats, or unrelenting night pain at this time.    Limitations Lifting;House hold activities;Writing    How long can you sit comfortably? unlimited    How long can you walk comfortably? unlimited    Diagnostic tests Xrays [facility] 12/03/19    Patient Stated Goals Full ROM to complete self care    Pain Onset More than a month ago             Ther-Ex TRX flex/abd walkouts x12 Total gym "pull ups" L9 2 x6 with cuing for eccentric lower Prone row 4# 3x 10 with min cuing for scapular retraction   Manual STM with trigger point release to mid/lower trap fibers, and latissimus PROM all directions                            PT Education - 04/15/20 0859    Education Details therex form/technique    Person(s) Educated Patient    Methods Explanation;Demonstration;Verbal cues    Comprehension Verbalized understanding;Returned demonstration;Verbal cues required            PT Short Term Goals - 01/25/20 1002      PT SHORT TERM GOAL #1   Title Pt will be independent with HEP in order to improve strength and decrease pain in order to improve pain-free function at home and work.    Baseline 01/23/20 HEP given    Time 4    Period Weeks    Status New             PT Long Term Goals - 01/25/20 1003      PT LONG TERM  GOAL #1   Title Pt will demonstrate full active L shoulder ROM in order to complete self care and overhead ADLS    Baseline 80/16/55 flex 39d abd 39d  ER L ear IR L PSIS    Time 8    Period Weeks    Status New      PT LONG TERM GOAL #2   Title Pt will decrease worst pain as reported on NPRS by at least 3 points in order to demonstrate clinically significant reduction in pain.    Baseline 01/25/20 8/10    Time 8    Period Weeks    Status New      PT LONG TERM GOAL #3   Title Pt will demonstrate L gross shoulder strength of 5/5 in order to demonstrate PLOF  and strength needed for heavy household tasks    Baseline 01/23/20 flex 2+ abd 2+ ER 2+ IR 2+    Time 8    Period Weeks    Status New      PT LONG TERM GOAL #4   Title Patient will increase FOTO score to 79 to demonstrate predicted increase in functional mobility to complete ADLs                 Plan - 04/15/20 0900    Clinical Impression Statement Session shorted d/t patient needing to leave for work. PT continued therex progression and manual techniques for increased mobility. Pt is demonstrating a plateau in active motion availible and is starting to demonstrate an adhesive capsulitis pattern, with possible RTC involvement. PT will update POC following MRI scheduled tomorrow. Patient is able to comply with all cuing for teherex technique and is motivated throughout session. PT will continue progression as able.    Personal Factors and Comorbidities Comorbidity 1;Comorbidity 2;Comorbidity 3+;Past/Current Experience;Fitness;Time since onset of injury/illness/exacerbation    Comorbidities breast cancer, HTN, GERD, anxiety    Examination-Activity Limitations Bathing;Lift;Transfers;Reach Overhead;Carry;Dressing    Examination-Participation Restrictions Yard Work;Cleaning;Community Activity;Driving;Occupation;Laundry    Stability/Clinical Decision Making Evolving/Moderate complexity    Clinical Decision Making Moderate    Rehab  Potential Good    PT Frequency 2x / week    PT Duration 8 weeks    PT Treatment/Interventions ADLs/Self Care Home Management;Electrical Stimulation;Therapeutic exercise;Balance training;Joint Manipulations;Taping;Spinal Manipulations;Cryotherapy;Iontophoresis 51m/ml Dexamethasone;DME Instruction;Neuromuscular re-education;Gait training;Stair training;Functional mobility training;Moist Heat;Traction;Ultrasound;Therapeutic activities;Patient/family education;Manual techniques;Dry needling;Passive range of motion;Vestibular    PT Next Visit Plan PROM/AAROM    PT Home Exercise Plan see 04/07/20 note    Consulted and Agree with Plan of Care Patient           Patient will benefit from skilled therapeutic intervention in order to improve the following deficits and impairments:  Decreased balance,Impaired flexibility,Impaired UE functional use,Hypomobility,Decreased strength,Decreased range of motion,Decreased endurance,Decreased activity tolerance,Postural dysfunction,Increased muscle spasms,Hypermobility,Decreased mobility,Decreased coordination,Abnormal gait,Pain,Improper body mechanics,Increased fascial restricitons  Visit Diagnosis: Acute pain of left shoulder  Stiffness of left shoulder, not elsewhere classified     Problem List Patient Active Problem List   Diagnosis Date Noted  . Breast asymmetry following reconstructive surgery 03/25/2020  . S/P breast reconstruction, bilateral 03/06/2020  . Sebaceous cyst 12/31/2019  . Breast wound 10/05/2019  . Acquired absence of bilateral breasts and nipples 09/04/2019  . Genetic testing 08/10/2019  . Monoallelic mutation of CHEK2 gene in female patient 08/07/2019  . Malignant neoplasm of upper-outer quadrant of right breast in [place], [place] 07/30/2019  . Goals of care, counseling/discussion 07/30/2019  . Family history of prostate cancer   . Family history of colon cancer   . Breast cancer ([place] 07/25/2019  . Left  lower quadrant pain 05/23/2018  . Personal history of kidney stones 04/20/2018  . Microscopic hematuria 04/20/2018  . 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 Overweight (BMI 25.0-29.9) 01/22/2017  . Encounter for preventive health examination 06/30/2016  . Renal colic 005/69/7948 . Ureteral calculus 04/12/2015  . Nephrolithiasis 03/04/2015  . Intractable migraine with aura without status migrainosus 10/29/2013  . Lack of libido 06/24/2013  . Migraine syndrome 03/11/2013  . Generalized anxiety disorder 03/11/2013   CDurwin RegesDPT CDurwin Reges3/09/2020, 10:45 AM  [facility] [address] [place] [address]Phone: [phone]  Fax:  [phone] Name: TEllis  Austen Wygant MRN: [id number] Date of Birth: Jan 24, 1966

## 2020-04-18 ENCOUNTER — Ambulatory Visit: Payer: BC Managed Care – PPO | Admitting: Physical Therapy

## 2020-04-22 ENCOUNTER — Encounter: Payer: Self-pay | Admitting: Surgical

## 2020-04-22 ENCOUNTER — Other Ambulatory Visit: Payer: Self-pay

## 2020-04-22 ENCOUNTER — Ambulatory Visit (INDEPENDENT_AMBULATORY_CARE_PROVIDER_SITE_OTHER): Payer: BC Managed Care – PPO | Admitting: Surgical

## 2020-04-22 VITALS — BP 122/81 | HR 96 | Ht 64.0 in | Wt 155.0 lb

## 2020-04-22 DIAGNOSIS — N651 Disproportion of reconstructed breast: Secondary | ICD-10-CM

## 2020-04-22 DIAGNOSIS — Z9013 Acquired absence of bilateral breasts and nipples: Secondary | ICD-10-CM

## 2020-04-22 DIAGNOSIS — Z9889 Other specified postprocedural states: Secondary | ICD-10-CM

## 2020-04-22 NOTE — Progress Notes (Signed)
Patient ID: Tiffany Acevedo, female    DOB: November 17, 1965, 55 y.o.   MRN: [id number]  Chief Complaint  Patient presents with  . Pre-op Exam      ICD-10-CM   1. S/P breast reconstruction, bilateral  Z98.890   2. Acquired absence of bilateral breasts and nipples  Z90.13   3. Breast asymmetry following reconstructive surgery  N65.1      History of Present Illness: Tiffany Acevedo is a 55 y.o.  female  with a history of bilateral mastectomies after diagnosis of right breast cancer and genetic testing.  She underwent bilateral breast reconstruction with placement of tissue expanders followed by bilateral breast implants.  She presents for preoperative evaluation for upcoming procedure, liposuction of abdomen for Lipo filling of bilateral breasts, scheduled for 05/05/2020 with Dr. Marla Roe.  Patient is here with her husband.  The patient has not had problems with anesthesia. No history of DVT/PE.  No family history of DVT/PE.  No family or personal history of bleeding or clotting disorders.  Patient is not currently taking any blood thinners.  No history of CVA/MI.   Job: Teacher  PMH Significant for: Hypertension, migraines, anxiety  Patient reports that overall she is doing well, she does report that she is having a cough from Covid diagnosis approximately 5 weeks ago.  She reports that she has not seen her PCP in relation to this, but reports she may schedule an appointment to discuss her cough.  She reports she otherwise is doing well.  She does report that she is completing physical therapy for her left humeral fracture and her adhesive capsulitis of the left shoulder.  She reports she is seeing orthopedics for this and they are planning steroid injections for her shoulder.  She reports that she has some pain within the right lateral breast along her axillary region.  She is unsure if it is lymphedema related or due to the excess tissue in this area.  She has some  questions about this.   Past Medical History: Allergies: Allergies  Allergen Reactions  . Percocet [Oxycodone-Acetaminophen] Other (See Comments)    Arms and legs go numb  . Sulfa Antibiotics Nausea And Vomiting    Current Medications:  Current Outpatient Medications:  .  Ascorbic Acid (VITAMIN C) 100 MG tablet, Take 500 mg by mouth daily., Disp: , Rfl:  .  Calcium Carbonate-Vit D-Min (CALCIUM 1200 PO), Take 1 tablet by mouth daily. , Disp: , Rfl:  .  EMGALITY 120 MG/ML SOAJ, Inject 120 mg into the skin every 28 (twenty-eight) days., Disp: , Rfl:  .  hydrochlorothiazide (MICROZIDE) 12.5 MG capsule, Take 1 capsule (12.5 mg total) by mouth daily., Disp: 30 capsule, Rfl: 2 .  ibuprofen (ADVIL,MOTRIN) 200 MG tablet, Take 400 mg by mouth every 6 (six) hours as needed for headache or moderate pain. , Disp: , Rfl:  .  Ibuprofen-diphenhydrAMINE Cit (MOTRIN PM PO), Take 1 tablet by mouth at bedtime., Disp: , Rfl:  .  imipramine (TOFRANIL) 50 MG tablet, Take 50 mg by mouth at bedtime. , Disp: , Rfl:  .  metoprolol succinate (TOPROL-XL) 100 MG 24 hr tablet, Take 25 mg by mouth daily., Disp: , Rfl:  .  Multiple Vitamin (MULTIVITAMIN PO), Take 1 tablet by mouth daily., Disp: , Rfl:  .  omeprazole (PRILOSEC) 20 MG capsule, Take 20 mg by mouth daily. , Disp: , Rfl:  .  rizatriptan (MAXALT) 10 MG tablet, Take 10 mg by mouth as needed  for migraine. , Disp: , Rfl:  .  tamoxifen (NOLVADEX) 20 MG tablet, TAKE 1 TABLET BY MOUTH DAILY., Disp: 30 tablet, Rfl: 2 .  venlafaxine XR (EFFEXOR-XR) 150 MG 24 hr capsule, TAKE 1 CAPSULE BY MOUTH EVERY DAY WITH BREAKFAST (Patient taking differently: Take 150 mg by mouth daily with breakfast.), Disp: 30 capsule, Rfl: 5 .  Zinc 22.5 MG TABS, Take 1 tablet by mouth daily., Disp: , Rfl:    Past Medical Problems: Past Medical History:  Diagnosis Date  . Allergy   . Anxiety   . Breast cancer ([employer])   . Depression   . Excessive weight gain 06/30/2016  . Family history  of colon cancer   . Family history of colon cancer   . Family history of prostate cancer   . Frequent headaches   . GERD (gastroesophageal reflux disease)   . History of kidney stones   . Hypertension   . Migraines   . Pre-diabetes     Past Surgical History: Past Surgical History:  Procedure Laterality Date  . BREAST BIOPSY Right 07/13/2019   stereo biopsy/ x clip/path pending  . BREAST BIOPSY Right 07/13/2019   stereo biopsy/coil clip/ path pending  . BREAST RECONSTRUCTION WITH PLACEMENT OF TISSUE EXPANDER AND FLEX HD (ACELLULAR HYDRATED DERMIS) Bilateral 08/27/2019   Procedure: BILATERAL BREAST RECONSTRUCTION WITH PLACEMENT OF TISSUE EXPANDER AND FLEX HD (ACELLULAR HYDRATED DERMIS);  Surgeon: Wallace Going, DO;  Location: [facility];  Service: Plastics;  Laterality: Bilateral;  . [place], 2004, 2013  . COLONOSCOPY  2015  . CYSTOSCOPY W/ RETROGRADES Right 09/13/2018   Procedure: CYSTOSCOPY WITH RETROGRADE PYELOGRAM;  Surgeon: Abbie Sons, MD;  Location: [facility];  Service: Urology;  Laterality: Right;  . CYSTOSCOPY/URETEROSCOPY/HOLMIUM LASER/STENT PLACEMENT Left 05/09/2018   Procedure: CYSTOSCOPY/URETEROSCOPY/HOLMIUM LASER/STENT PLACEMENT;  Surgeon: Abbie Sons, MD;  Location: [facility];  Service: Urology;  Laterality: Left;  . CYSTOSCOPY/URETEROSCOPY/HOLMIUM LASER/STENT PLACEMENT Right 09/13/2018   Procedure: CYSTOSCOPY/URETEROSCOPY/HOLMIUM LASER/STENT PLACEMENT;  Surgeon: Abbie Sons, MD;  Location: [facility];  Service: Urology;  Laterality: Right;  . INCONTINENCE SURGERY     BLADDER TUCK  . PILONIDAL CYST EXCISION    . REMOVAL OF BILATERAL TISSUE EXPANDERS WITH PLACEMENT OF BILATERAL BREAST IMPLANTS Bilateral 01/02/2020   Procedure: REMOVAL OF BILATERAL TISSUE EXPANDERS WITH PLACEMENT OF BILATERAL BREAST IMPLANTS;  Surgeon: Wallace Going, DO;  Location: [facility];  Service: Plastics;  Laterality: Bilateral;  2 hours, please  .  STONE EXTRACTION WITH BASKET Right 09/13/2018   Procedure: STONE EXTRACTION WITH BASKET;  Surgeon: Abbie Sons, MD;  Location: [facility];  Service: Urology;  Laterality: Right;  . TOTAL MASTECTOMY Bilateral 08/27/2019   Procedure: TOTAL MASTECTOMY;  Surgeon: Herbert Pun, MD;  Location: [facility];  Service: General;  Laterality: Bilateral;  START @930  DUE TO SENTINEL NODE  . UPPER GI ENDOSCOPY      Social History: Social History   Socioeconomic History  . Marital status: Married    Spouse name: Not on file  . Number of children: Not on file  . Years of education: Not on file  . Highest education level: Not on file  Occupational History  . Not on file  Tobacco Use  . Smoking status: Never Smoker  . Smokeless tobacco: Never Used  Vaping Use  . Vaping Use: Never used  Substance and Sexual Activity  . Alcohol use: Yes    Comment: glass of wine occassionally  . Drug use: No  .  Sexual activity: Yes    Partners: Male  Other Topics Concern  . Not on file  Social History Narrative  . Not on file   Social Determinants of Health   Financial Resource Strain: Not on file  Food Insecurity: Not on file  Transportation Needs: Not on file  Physical Activity: Not on file  Stress: Not on file  Social Connections: Not on file  Intimate Partner Violence: Not on file    Family History: Family History  Problem Relation Age of Onset  . Hyperlipidemia Mother   . Hypertension Mother   . Depression Mother   . Colon cancer Mother 60  . Alcohol abuse Father   . Hyperlipidemia Father   . Stroke Father   . Hypertension Father   . Depression Father   . Heart disease Father   . Prostate cancer Father 67  . Arthritis Maternal Grandmother   . Hypertension Maternal Grandmother   . Depression Maternal Grandmother   . Arthritis Maternal Grandfather   . Stroke Maternal Grandfather   . Hypertension Maternal Grandfather   . Depression Maternal Grandfather   . Hypertension Paternal  Grandmother   . Depression Paternal Grandmother   . Hypertension Paternal Grandfather   . Depression Paternal Grandfather   . Leukemia Maternal Aunt        d <50  . Cancer Paternal Aunt        unsure types    Review of Systems: Review of Systems  Constitutional: Negative.   Respiratory: Positive for cough.   Cardiovascular: Negative.   Gastrointestinal: Negative.   Skin: Negative.   Neurological: Negative.     Physical Exam: Vital Signs BP 122/81 (BP Location: Left Arm, Patient Position: Sitting, Cuff Size: Normal)   Pulse 96   Ht 5\' 4"  (1.626 m)   Wt 155 lb (70.3 kg)   SpO2 98%   BMI 26.61 kg/m   Physical Exam Constitutional:      General: Not in acute distress.    Appearance: Normal appearance. Not ill-appearing.  HENT:     Head: Normocephalic and atraumatic.  Eyes:     Pupils: Pupils are equal, round Neck:     Musculoskeletal: Normal range of motion.  Cardiovascular:     Rate and Rhythm: Normal rate    Pulses: Normal pulses.  Pulmonary:     Effort: Pulmonary effort is normal. No respiratory distress.     Breath sounds: Normal breath sounds. No wheezing.  Abdominal:     General: Abdomen is flat. There is no distension.     Palpations: Abdomen is soft.     Tenderness: There is no abdominal tenderness.  Musculoskeletal: Normal range of motion.  Skin:    General: Skin is warm and dry.     Findings: No erythema or rash.  Neurological:     General: No focal deficit present.     Mental Status: Alert and oriented to person, place, and time. Mental status is at baseline.     Motor: No weakness.  Psychiatric:        Mood and Affect: Mood normal.        Behavior: Behavior normal.    Assessment/Plan: The patient is scheduled for liposuction with Lipo filling of bilateral breasts with Dr. Marla Roe.  Risks, benefits, and alternatives of procedure discussed, questions answered and consent obtained.    Smoking Status: Non-smoker; Counseling Given?  N/A Last  Mammogram: Status post bilateral mastectomy, most recent mammogram on 06/18/2019;   Caprini Score: 7, high risk;  Risk Factors include: Age, BMI greater than 25, history of breast cancer, currently on tamoxifen and length of planned surgery. Recommendation for mechanical and pharmacological prophylaxis. Encourage early ambulation.   Recommend patient hold tamoxifen 2 weeks prior to surgery, hold any multivitamins 2 weeks prior to surgery, recommend no ibuprofen or NSAIDs 1 week prior to surgery.  Pictures obtained: 03/07/2020  Post-op Rx sent to pharmacy: No prescription sent at this time, patient may postpone surgery.  Patient was provided with the General Surgical Risk consent document and Pain Medication Agreement prior to their appointment.  They had adequate time to read through the risk consent documents and Pain Medication Agreement. We also discussed them in person together during this preop appointment. All of their questions were answered to their satisfaction.  Recommended calling if they have any further questions.  Risk consent form and Pain Medication Agreement to be scanned into patient's chart.  The risks that can be encountered with and after liposuction were discussed and include the following but no limited to these:  Asymmetry, fluid accumulation, firmness of the area, fat necrosis with death of fat tissue, bleeding, infection, delayed healing, anesthesia risks, skin sensation changes, injury to structures including nerves, blood vessels, and muscles which may be temporary or permanent, allergies to tape, suture materials and glues, blood products, topical preparations or injected agents, skin and contour irregularities, skin discoloration and swelling, deep vein thrombosis, cardiac and pulmonary complications, pain, which may persist, persistent pain, recurrence of the lesion, poor healing of the incision, possible need for revisional surgery or staged procedures. Thiere can also be  persistent swelling, poor wound healing, rippling or loose skin, worsening of cellulite, swelling, and thermal burn or heat injury from ultrasound with the ultrasound-assisted lipoplasty technique. Any change in weight fluctuations can alter the outcome.  Patient is interested in excision of right breast excess tissue, we discussed with this procedure she would have increased postoperative restrictions including heavy lifting or strenuous activities.  She is currently undergoing physical therapy for her adhesive capsulitis and left humerus fracture.  We discussed that undergoing the excess tissue excision would limit her ability to complete physical therapy for the adhesive capsulitis and left humerus fracture.  I discussed with the patient that given her current situation of the left humeral fracture and adhesive capsulitis that postponing her surgical intervention is an option to allow her to recover better from her orthopedic injury.  She and her husband are going to think about this and give our office and update.  I did discuss with her that I would discuss possible excision of excess tissue of her right and left lateral breast with Dr. Marla Roe and our surgical scheduling team to determine if this was possible.   Electronically signed by: Carola Rhine Laynie Espy, PA-C 04/22/2020 2:07 PM

## 2020-04-22 NOTE — H&P (View-Only) (Signed)
Patient ID: Tiffany Acevedo, female    DOB: October 21, 1965, 55 y.o.   MRN: [id number]  Chief Complaint  Patient presents with  . Pre-op Exam      ICD-10-CM   1. S/P breast reconstruction, bilateral  Z98.890   2. Acquired absence of bilateral breasts and nipples  Z90.13   3. Breast asymmetry following reconstructive surgery  N65.1      History of Present Illness: Tiffany Acevedo is a 55 y.o.  female  with a history of bilateral mastectomies after diagnosis of right breast cancer and genetic testing.  She underwent bilateral breast reconstruction with placement of tissue expanders followed by bilateral breast implants.  She presents for preoperative evaluation for upcoming procedure, liposuction of abdomen for Lipo filling of bilateral breasts, scheduled for 05/05/2020 with Dr. Marla Roe.  Patient is here with her husband.  The patient has not had problems with anesthesia. No history of DVT/PE.  No family history of DVT/PE.  No family or personal history of bleeding or clotting disorders.  Patient is not currently taking any blood thinners.  No history of CVA/MI.   Job: Teacher  PMH Significant for: Hypertension, migraines, anxiety  Patient reports that overall she is doing well, she does report that she is having a cough from Covid diagnosis approximately 5 weeks ago.  She reports that she has not seen her PCP in relation to this, but reports she may schedule an appointment to discuss her cough.  She reports she otherwise is doing well.  She does report that she is completing physical therapy for her left humeral fracture and her adhesive capsulitis of the left shoulder.  She reports she is seeing orthopedics for this and they are planning steroid injections for her shoulder.  She reports that she has some pain within the right lateral breast along her axillary region.  She is unsure if it is lymphedema related or due to the excess tissue in this area.  She has some  questions about this.   Past Medical History: Allergies: Allergies  Allergen Reactions  . Percocet [Oxycodone-Acetaminophen] Other (See Comments)    Arms and legs go numb  . Sulfa Antibiotics Nausea And Vomiting    Current Medications:  Current Outpatient Medications:  .  Ascorbic Acid (VITAMIN C) 100 MG tablet, Take 500 mg by mouth daily., Disp: , Rfl:  .  Calcium Carbonate-Vit D-Min (CALCIUM 1200 PO), Take 1 tablet by mouth daily. , Disp: , Rfl:  .  EMGALITY 120 MG/ML SOAJ, Inject 120 mg into the skin every 28 (twenty-eight) days., Disp: , Rfl:  .  hydrochlorothiazide (MICROZIDE) 12.5 MG capsule, Take 1 capsule (12.5 mg total) by mouth daily., Disp: 30 capsule, Rfl: 2 .  ibuprofen (ADVIL,MOTRIN) 200 MG tablet, Take 400 mg by mouth every 6 (six) hours as needed for headache or moderate pain. , Disp: , Rfl:  .  Ibuprofen-diphenhydrAMINE Cit (MOTRIN PM PO), Take 1 tablet by mouth at bedtime., Disp: , Rfl:  .  imipramine (TOFRANIL) 50 MG tablet, Take 50 mg by mouth at bedtime. , Disp: , Rfl:  .  metoprolol succinate (TOPROL-XL) 100 MG 24 hr tablet, Take 25 mg by mouth daily., Disp: , Rfl:  .  Multiple Vitamin (MULTIVITAMIN PO), Take 1 tablet by mouth daily., Disp: , Rfl:  .  omeprazole (PRILOSEC) 20 MG capsule, Take 20 mg by mouth daily. , Disp: , Rfl:  .  rizatriptan (MAXALT) 10 MG tablet, Take 10 mg by mouth as needed  for migraine. , Disp: , Rfl:  .  tamoxifen (NOLVADEX) 20 MG tablet, TAKE 1 TABLET BY MOUTH DAILY., Disp: 30 tablet, Rfl: 2 .  venlafaxine XR (EFFEXOR-XR) 150 MG 24 hr capsule, TAKE 1 CAPSULE BY MOUTH EVERY DAY WITH BREAKFAST (Patient taking differently: Take 150 mg by mouth daily with breakfast.), Disp: 30 capsule, Rfl: 5 .  Zinc 22.5 MG TABS, Take 1 tablet by mouth daily., Disp: , Rfl:    Past Medical Problems: Past Medical History:  Diagnosis Date  . Allergy   . Anxiety   . Breast cancer ([place])   . Depression   . Excessive weight gain 06/30/2016  . Family history  of colon cancer   . Family history of colon cancer   . Family history of prostate cancer   . Frequent headaches   . GERD (gastroesophageal reflux disease)   . History of kidney stones   . Hypertension   . Migraines   . Pre-diabetes     Past Surgical History: Past Surgical History:  Procedure Laterality Date  . BREAST BIOPSY Right 07/13/2019   stereo biopsy/ x clip/path pending  . BREAST BIOPSY Right 07/13/2019   stereo biopsy/coil clip/ path pending  . BREAST RECONSTRUCTION WITH PLACEMENT OF TISSUE EXPANDER AND FLEX HD (ACELLULAR HYDRATED DERMIS) Bilateral 08/27/2019   Procedure: BILATERAL BREAST RECONSTRUCTION WITH PLACEMENT OF TISSUE EXPANDER AND FLEX HD (ACELLULAR HYDRATED DERMIS);  Surgeon: Wallace Going, DO;  Location: [facility];  Service: Plastics;  Laterality: Bilateral;  . [place], 2004, 2013  . COLONOSCOPY  2015  . CYSTOSCOPY W/ RETROGRADES Right 09/13/2018   Procedure: CYSTOSCOPY WITH RETROGRADE PYELOGRAM;  Surgeon: Abbie Sons, MD;  Location: [facility];  Service: Urology;  Laterality: Right;  . CYSTOSCOPY/URETEROSCOPY/HOLMIUM LASER/STENT PLACEMENT Left 05/09/2018   Procedure: CYSTOSCOPY/URETEROSCOPY/HOLMIUM LASER/STENT PLACEMENT;  Surgeon: Abbie Sons, MD;  Location: [facility];  Service: Urology;  Laterality: Left;  . CYSTOSCOPY/URETEROSCOPY/HOLMIUM LASER/STENT PLACEMENT Right 09/13/2018   Procedure: CYSTOSCOPY/URETEROSCOPY/HOLMIUM LASER/STENT PLACEMENT;  Surgeon: Abbie Sons, MD;  Location: [facility];  Service: Urology;  Laterality: Right;  . INCONTINENCE SURGERY     BLADDER TUCK  . PILONIDAL CYST EXCISION    . REMOVAL OF BILATERAL TISSUE EXPANDERS WITH PLACEMENT OF BILATERAL BREAST IMPLANTS Bilateral 01/02/2020   Procedure: REMOVAL OF BILATERAL TISSUE EXPANDERS WITH PLACEMENT OF BILATERAL BREAST IMPLANTS;  Surgeon: Wallace Going, DO;  Location: [facility];  Service: Plastics;  Laterality: Bilateral;  2 hours, please  .  STONE EXTRACTION WITH BASKET Right 09/13/2018   Procedure: STONE EXTRACTION WITH BASKET;  Surgeon: Abbie Sons, MD;  Location: [facility];  Service: Urology;  Laterality: Right;  . TOTAL MASTECTOMY Bilateral 08/27/2019   Procedure: TOTAL MASTECTOMY;  Surgeon: Herbert Pun, MD;  Location: [facility];  Service: General;  Laterality: Bilateral;  START @930  DUE TO SENTINEL NODE  . UPPER GI ENDOSCOPY      Social History: Social History   Socioeconomic History  . Marital status: Married    Spouse name: Not on file  . Number of children: Not on file  . Years of education: Not on file  . Highest education level: Not on file  Occupational History  . Not on file  Tobacco Use  . Smoking status: Never Smoker  . Smokeless tobacco: Never Used  Vaping Use  . Vaping Use: Never used  Substance and Sexual Activity  . Alcohol use: Yes    Comment: glass of wine occassionally  . Drug use: No  .  Sexual activity: Yes    Partners: Male  Other Topics Concern  . Not on file  Social History Narrative  . Not on file   Social Determinants of Health   Financial Resource Strain: Not on file  Food Insecurity: Not on file  Transportation Needs: Not on file  Physical Activity: Not on file  Stress: Not on file  Social Connections: Not on file  Intimate Partner Violence: Not on file    Family History: Family History  Problem Relation Age of Onset  . Hyperlipidemia Mother   . Hypertension Mother   . Depression Mother   . Colon cancer Mother 63  . Alcohol abuse Father   . Hyperlipidemia Father   . Stroke Father   . Hypertension Father   . Depression Father   . Heart disease Father   . Prostate cancer Father 12  . Arthritis Maternal Grandmother   . Hypertension Maternal Grandmother   . Depression Maternal Grandmother   . Arthritis Maternal Grandfather   . Stroke Maternal Grandfather   . Hypertension Maternal Grandfather   . Depression Maternal Grandfather   . Hypertension Paternal  Grandmother   . Depression Paternal Grandmother   . Hypertension Paternal Grandfather   . Depression Paternal Grandfather   . Leukemia Maternal Aunt        d <50  . Cancer Paternal Aunt        unsure types    Review of Systems: Review of Systems  Constitutional: Negative.   Respiratory: Positive for cough.   Cardiovascular: Negative.   Gastrointestinal: Negative.   Skin: Negative.   Neurological: Negative.     Physical Exam: Vital Signs BP 122/81 (BP Location: Left Arm, Patient Position: Sitting, Cuff Size: Normal)   Pulse 96   Ht 5\' 4"  (1.626 m)   Wt 155 lb (70.3 kg)   SpO2 98%   BMI 26.61 kg/m   Physical Exam Constitutional:      General: Not in acute distress.    Appearance: Normal appearance. Not ill-appearing.  HENT:     Head: Normocephalic and atraumatic.  Eyes:     Pupils: Pupils are equal, round Neck:     Musculoskeletal: Normal range of motion.  Cardiovascular:     Rate and Rhythm: Normal rate    Pulses: Normal pulses.  Pulmonary:     Effort: Pulmonary effort is normal. No respiratory distress.     Breath sounds: Normal breath sounds. No wheezing.  Abdominal:     General: Abdomen is flat. There is no distension.     Palpations: Abdomen is soft.     Tenderness: There is no abdominal tenderness.  Musculoskeletal: Normal range of motion.  Skin:    General: Skin is warm and dry.     Findings: No erythema or rash.  Neurological:     General: No focal deficit present.     Mental Status: Alert and oriented to person, place, and time. Mental status is at baseline.     Motor: No weakness.  Psychiatric:        Mood and Affect: Mood normal.        Behavior: Behavior normal.    Assessment/Plan: The patient is scheduled for liposuction with Lipo filling of bilateral breasts with Dr. Marla Roe.  Risks, benefits, and alternatives of procedure discussed, questions answered and consent obtained.    Smoking Status: Non-smoker; Counseling Given?  N/A Last  Mammogram: Status post bilateral mastectomy, most recent mammogram on 06/18/2019;   Caprini Score: 7, high risk;  Risk Factors include: Age, BMI greater than 25, history of breast cancer, currently on tamoxifen and length of planned surgery. Recommendation for mechanical and pharmacological prophylaxis. Encourage early ambulation.   Recommend patient hold tamoxifen 2 weeks prior to surgery, hold any multivitamins 2 weeks prior to surgery, recommend no ibuprofen or NSAIDs 1 week prior to surgery.  Pictures obtained: 03/07/2020  Post-op Rx sent to pharmacy: No prescription sent at this time, patient may postpone surgery.  Patient was provided with the General Surgical Risk consent document and Pain Medication Agreement prior to their appointment.  They had adequate time to read through the risk consent documents and Pain Medication Agreement. We also discussed them in person together during this preop appointment. All of their questions were answered to their satisfaction.  Recommended calling if they have any further questions.  Risk consent form and Pain Medication Agreement to be scanned into patient's chart.  The risks that can be encountered with and after liposuction were discussed and include the following but no limited to these:  Asymmetry, fluid accumulation, firmness of the area, fat necrosis with death of fat tissue, bleeding, infection, delayed healing, anesthesia risks, skin sensation changes, injury to structures including nerves, blood vessels, and muscles which may be temporary or permanent, allergies to tape, suture materials and glues, blood products, topical preparations or injected agents, skin and contour irregularities, skin discoloration and swelling, deep vein thrombosis, cardiac and pulmonary complications, pain, which may persist, persistent pain, recurrence of the lesion, poor healing of the incision, possible need for revisional surgery or staged procedures. Thiere can also be  persistent swelling, poor wound healing, rippling or loose skin, worsening of cellulite, swelling, and thermal burn or heat injury from ultrasound with the ultrasound-assisted lipoplasty technique. Any change in weight fluctuations can alter the outcome.  Patient is interested in excision of right breast excess tissue, we discussed with this procedure she would have increased postoperative restrictions including heavy lifting or strenuous activities.  She is currently undergoing physical therapy for her adhesive capsulitis and left humerus fracture.  We discussed that undergoing the excess tissue excision would limit her ability to complete physical therapy for the adhesive capsulitis and left humerus fracture.  I discussed with the patient that given her current situation of the left humeral fracture and adhesive capsulitis that postponing her surgical intervention is an option to allow her to recover better from her orthopedic injury.  She and her husband are going to think about this and give our office and update.  I did discuss with her that I would discuss possible excision of excess tissue of her right and left lateral breast with Dr. Marla Roe and our surgical scheduling team to determine if this was possible.   Electronically signed by: Carola Rhine Aysen Shieh, PA-C 04/22/2020 2:07 PM

## 2020-04-23 ENCOUNTER — Encounter: Payer: Self-pay | Admitting: Physical Therapy

## 2020-04-23 ENCOUNTER — Ambulatory Visit: Payer: BC Managed Care – PPO | Admitting: Physical Therapy

## 2020-04-23 DIAGNOSIS — M25612 Stiffness of left shoulder, not elsewhere classified: Secondary | ICD-10-CM

## 2020-04-23 DIAGNOSIS — M25512 Pain in left shoulder: Secondary | ICD-10-CM

## 2020-04-23 NOTE — Therapy (Signed)
[facility] [address], [place], [address] Phone: [phone]   Fax:  [phone]  Physical Therapy Treatment  Patient Details  Name: Tiffany Acevedo MRN: [id number] Date of Birth: [birth year] No data recorded  Encounter Date: 04/23/2020   PT End of Session - 04/23/20 0933    Visit Number 21    Number of Visits 33    Date for PT Re-Evaluation 05/16/20    Authorization - Visit Number 21    Authorization - Number of Visits 20    PT Start Time 0904    PT Stop Time 0945    PT Time Calculation (min) 41 min    Activity Tolerance Patient tolerated treatment well    Behavior During Therapy [facility] for tasks assessed/performed           Past Medical History:  Diagnosis Date  . Allergy   . Anxiety   . Breast cancer ([place])   . Depression   . Excessive weight gain 06/30/2016  . Family history of colon cancer   . Family history of colon cancer   . Family history of prostate cancer   . Frequent headaches   . GERD (gastroesophageal reflux disease)   . History of kidney stones   . Hypertension   . Migraines   . Pre-diabetes     Past Surgical History:  Procedure Laterality Date  . BREAST BIOPSY Right 07/13/2019   stereo biopsy/ x clip/path pending  . BREAST BIOPSY Right 07/13/2019   stereo biopsy/coil clip/ path pending  . BREAST RECONSTRUCTION WITH PLACEMENT OF TISSUE EXPANDER AND FLEX HD (ACELLULAR HYDRATED DERMIS) Bilateral 08/27/2019   Procedure: BILATERAL BREAST RECONSTRUCTION WITH PLACEMENT OF TISSUE EXPANDER AND FLEX HD (ACELLULAR HYDRATED DERMIS);  Surgeon: Wallace Going, DO;  Location: [facility];  Service: Plastics;  Laterality: Bilateral;  . [place], 2004, 2013  . COLONOSCOPY  2015  . CYSTOSCOPY W/ RETROGRADES Right 09/13/2018   Procedure: CYSTOSCOPY WITH RETROGRADE PYELOGRAM;  Surgeon: Abbie Sons, MD;  Location: [facility];  Service: Urology;  Laterality: Right;  .  CYSTOSCOPY/URETEROSCOPY/HOLMIUM LASER/STENT PLACEMENT Left 05/09/2018   Procedure: CYSTOSCOPY/URETEROSCOPY/HOLMIUM LASER/STENT PLACEMENT;  Surgeon: Abbie Sons, MD;  Location: [facility];  Service: Urology;  Laterality: Left;  . CYSTOSCOPY/URETEROSCOPY/HOLMIUM LASER/STENT PLACEMENT Right 09/13/2018   Procedure: CYSTOSCOPY/URETEROSCOPY/HOLMIUM LASER/STENT PLACEMENT;  Surgeon: Abbie Sons, MD;  Location: [facility];  Service: Urology;  Laterality: Right;  . INCONTINENCE SURGERY     BLADDER TUCK  . PILONIDAL CYST EXCISION    . REMOVAL OF BILATERAL TISSUE EXPANDERS WITH PLACEMENT OF BILATERAL BREAST IMPLANTS Bilateral 01/02/2020   Procedure: REMOVAL OF BILATERAL TISSUE EXPANDERS WITH PLACEMENT OF BILATERAL BREAST IMPLANTS;  Surgeon: Wallace Going, DO;  Location: [place] [place];  Service: Plastics;  Laterality: Bilateral;  2 hours, please  . STONE EXTRACTION WITH BASKET Right 09/13/2018   Procedure: STONE EXTRACTION WITH BASKET;  Surgeon: Abbie Sons, MD;  Location: [facility];  Service: Urology;  Laterality: Right;  . TOTAL MASTECTOMY Bilateral 08/27/2019   Procedure: TOTAL MASTECTOMY;  Surgeon: Herbert Pun, MD;  Location: [facility];  Service: General;  Laterality: Bilateral;  START @930  DUE TO SENTINEL NODE  . UPPER GI ENDOSCOPY      There were no vitals filed for this visit.   Subjective Assessment - 04/23/20 0912    Subjective Pt comes in with MRI results, reported no labrum tear or RTC tear. Reports minimal pain on arrival.  Has been completing HEP with no issues.    Pertinent History Pt is a 55 year old female s/p L proximal humerus fracture 12/02/10. PMH double masectomy 08/27/19 with expander placement and reconstruction finished 01/03/20.Pt fell in [place] walking straight forward onto her shoulder. Patient reports fracture was non-displaced and she did not have surgery, by Dr. Adrian Prince advisement, wore sling 6 weeks. Currently does not know orthopedic expectations  for weight restrictions/movement resistrictions, but has not been lifting more than 1/2 gallon of milk, but is no longer wearing sling. 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 Patient reports motion is very restricted and she is unable to don/doff clothes, her husband washes her hair for her, unable to push/pull, or lift anything. Pt is an avid golfer, and walks often. Worst shoulder pain over the past week 8/10 best 0/10 but reports she avoids painful movement.  Pt is R hand dominant. Pt denies N/V, B&B changes, unexplained weight fluctuation, saddle paresthesia, fever, night sweats, or unrelenting night pain at this time.    Limitations Lifting;House hold activities;Writing    How long can you sit comfortably? unlimited    How long can you stand comfortably? unlimited    How long can you walk comfortably? unlimited    Diagnostic tests Xrays [facility] 12/03/19    Patient Stated Goals Full ROM to complete self care    Pain Onset More than a month ago           Ther-Ex TRX flex/abd walkouts x12 Total gym "pull ups"L12 2 x8/6; L13 x6 with cuing for eccentric lower; 10sec hand at end of sets Seated military press 2# 3x 6 with cuing to maintain scapular retraction with good carry over [place] chest/ slight overhead press 3x 8/7/6  Manual STM withtrigger point releaseto mid/lower trap fibers, and latissimus PROM all directions       PT Education - 04/23/20 0933    Education Details therex form/technique    Person(s) Educated Patient    Methods Explanation;Demonstration;Verbal cues    Comprehension Verbalized understanding;Returned demonstration;Verbal cues required            PT Short Term Goals - 01/25/20 1002      PT SHORT TERM GOAL #1   Title Pt will be independent with HEP in order to improve strength and decrease pain in order to improve pain-free function at home and work.    Baseline 01/23/20 HEP given     Time 4    Period Weeks    Status New             PT Long Term Goals - 01/25/20 1003      PT LONG TERM GOAL #1   Title Pt will demonstrate full active L shoulder ROM in order to complete self care and overhead ADLS    Baseline 63/89/37 flex 39d abd 39d  ER L ear IR L PSIS    Time 8    Period Weeks    Status New      PT LONG TERM GOAL #2   Title Pt will decrease worst pain as reported on NPRS by at least 3 points in order to demonstrate clinically significant reduction in pain.    Baseline 01/25/20 8/10    Time 8    Period Weeks    Status New      PT LONG TERM GOAL #3   Title Pt will demonstrate L gross shoulder strength of 5/5 in  order to demonstrate PLOF and strength needed for heavy household tasks    Baseline 01/23/20 flex 2+ abd 2+ ER 2+ IR 2+    Time 8    Period Weeks    Status New      PT LONG TERM GOAL #4   Title Patient will increase FOTO score to 79 to demonstrate predicted increase in functional mobility to complete ADLs                 Plan - 04/23/20 1040    Clinical Impression Statement PT continued therex progression for increased shoulder strength and mobility with success. Patient is able to comply with all cuing for proper technique with good motivation throughout session. Pt with difficulty with gaurding through manual techniques, but is able to eventually obtain more range passively this session. PT will continue progression as able.    Personal Factors and Comorbidities Comorbidity 1;Comorbidity 2;Comorbidity 3+;Past/Current Experience;Fitness;Time since onset of injury/illness/exacerbation    Comorbidities breast cancer, HTN, GERD, anxiety    Examination-Activity Limitations Bathing;Lift;Transfers;Reach Overhead;Carry;Dressing    Examination-Participation Restrictions Yard Work;Cleaning;Community Activity;Driving;Occupation;Laundry    Stability/Clinical Decision Making Evolving/Moderate complexity    Clinical Decision Making Moderate    Rehab  Potential Good    PT Frequency 2x / week    PT Duration 8 weeks    PT Treatment/Interventions ADLs/Self Care Home Management;Electrical Stimulation;Therapeutic exercise;Balance training;Joint Manipulations;Taping;Spinal Manipulations;Cryotherapy;Iontophoresis 25m/ml Dexamethasone;DME Instruction;Neuromuscular re-education;Gait training;Stair training;Functional mobility training;Moist Heat;Traction;Ultrasound;Therapeutic activities;Patient/family education;Manual techniques;Dry needling;Passive range of motion;Vestibular    PT Next Visit Plan PROM/AAROM    PT Home Exercise Plan see 04/07/20 note    Consulted and Agree with Plan of Care Patient           Patient will benefit from skilled therapeutic intervention in order to improve the following deficits and impairments:  Decreased balance,Impaired flexibility,Impaired UE functional use,Hypomobility,Decreased strength,Decreased range of motion,Decreased endurance,Decreased activity tolerance,Postural dysfunction,Increased muscle spasms,Hypermobility,Decreased mobility,Decreased coordination,Abnormal gait,Pain,Improper body mechanics,Increased fascial restricitons  Visit Diagnosis: Acute pain of left shoulder  Stiffness of left shoulder, not elsewhere classified     Problem List Patient Active Problem List   Diagnosis Date Noted  . Breast asymmetry following reconstructive surgery 03/25/2020  . S/P breast reconstruction, bilateral 03/06/2020  . Sebaceous cyst 12/31/2019  . Breast wound 10/05/2019  . Acquired absence of bilateral breasts and nipples 09/04/2019  . Genetic testing 08/10/2019  . Monoallelic mutation of CHEK2 gene in female patient 08/07/2019  . Malignant neoplasm of upper-outer quadrant of right breast in female, estrogen receptor positive [place] 07/30/2019  . Goals of care, counseling/discussion 07/30/2019  . Family history of prostate cancer   . Family history of colon cancer   . Breast cancer [place] 07/25/2019  . Left  lower quadrant pain 05/23/2018  . Personal history of kidney stones 04/20/2018  . Microscopic hematuria 04/20/2018  . 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 Overweight (BMI 25.0-29.9) 01/22/2017  . Encounter for preventive health examination 06/30/2016  . Renal colic 009/23/3007 . Ureteral calculus 04/12/2015  . Nephrolithiasis 03/04/2015  . Intractable migraine with aura without status migrainosus 10/29/2013  . Lack of libido 06/24/2013  . Migraine syndrome 03/11/2013  . Generalized anxiety disorder 03/11/2013   CDurwin RegesDPT CDurwin Reges3/16/2022, 11:09 AM  [facility] [address] [place] [id number]: [phone]  Fax:  [phone] Name: TSanam MarmoMRN: [id number] of Birth: 110/07/1965

## 2020-04-25 ENCOUNTER — Encounter: Payer: Self-pay | Admitting: Physical Therapy

## 2020-04-25 ENCOUNTER — Other Ambulatory Visit: Payer: Self-pay

## 2020-04-25 ENCOUNTER — Ambulatory Visit: Payer: BC Managed Care – PPO | Admitting: Physical Therapy

## 2020-04-25 DIAGNOSIS — M25512 Pain in left shoulder: Secondary | ICD-10-CM | POA: Diagnosis not present

## 2020-04-25 DIAGNOSIS — M25612 Stiffness of left shoulder, not elsewhere classified: Secondary | ICD-10-CM

## 2020-04-25 NOTE — Therapy (Signed)
[facility] [address] [place], [address] Phone: [phone]   Fax:  [phone]  Physical Therapy Treatment  Patient Details  Name: Tiffany Acevedo MRN: [id number] Date of Birth: 23-Oct-1965 No data recorded  Encounter Date: 04/25/2020   PT End of Session - 04/25/20 1035    Visit Number 22    Number of Visits 33    Date for PT Re-Evaluation 05/16/20    Authorization - Visit Number 67    Authorization - Number of Visits 20    PT Start Time 0804    PT Stop Time 8315    PT Time Calculation (min) 31 min    Activity Tolerance Patient tolerated treatment well    Behavior During Therapy [facility] for tasks assessed/performed           Past Medical History:  Diagnosis Date  . Allergy   . Anxiety   . Breast cancer ([place])   . Depression   . Excessive weight gain 06/30/2016  . Family history of colon cancer   . Family history of colon cancer   . Family history of prostate cancer   . Frequent headaches   . GERD (gastroesophageal reflux disease)   . History of kidney stones   . Hypertension   . Migraines   . Pre-diabetes     Past Surgical History:  Procedure Laterality Date  . BREAST BIOPSY Right 07/13/2019   stereo biopsy/ x clip/path pending  . BREAST BIOPSY Right 07/13/2019   stereo biopsy/coil clip/ path pending  . BREAST RECONSTRUCTION WITH PLACEMENT OF TISSUE EXPANDER AND FLEX HD (ACELLULAR HYDRATED DERMIS) Bilateral 08/27/2019   Procedure: BILATERAL BREAST RECONSTRUCTION WITH PLACEMENT OF TISSUE EXPANDER AND FLEX HD (ACELLULAR HYDRATED DERMIS);  Surgeon: Wallace Going, DO;  Location: [facility];  Service: Plastics;  Laterality: Bilateral;  . [place], 2004, 2013  . COLONOSCOPY  2015  . CYSTOSCOPY W/ RETROGRADES Right 09/13/2018   Procedure: CYSTOSCOPY WITH RETROGRADE PYELOGRAM;  Surgeon: Abbie Sons, MD;  Location: [facility];  Service: Urology;  Laterality: Right;  .  CYSTOSCOPY/URETEROSCOPY/HOLMIUM LASER/STENT PLACEMENT Left 05/09/2018   Procedure: CYSTOSCOPY/URETEROSCOPY/HOLMIUM LASER/STENT PLACEMENT;  Surgeon: Abbie Sons, MD;  Location: [facility];  Service: Urology;  Laterality: Left;  . CYSTOSCOPY/URETEROSCOPY/HOLMIUM LASER/STENT PLACEMENT Right 09/13/2018   Procedure: CYSTOSCOPY/URETEROSCOPY/HOLMIUM LASER/STENT PLACEMENT;  Surgeon: Abbie Sons, MD;  Location: [facility];  Service: Urology;  Laterality: Right;  . INCONTINENCE SURGERY     BLADDER TUCK  . PILONIDAL CYST EXCISION    . REMOVAL OF BILATERAL TISSUE EXPANDERS WITH PLACEMENT OF BILATERAL BREAST IMPLANTS Bilateral 01/02/2020   Procedure: REMOVAL OF BILATERAL TISSUE EXPANDERS WITH PLACEMENT OF BILATERAL BREAST IMPLANTS;  Surgeon: Wallace Going, DO;  Location: [facility];  Service: Plastics;  Laterality: Bilateral;  2 hours, please  . STONE EXTRACTION WITH BASKET Right 09/13/2018   Procedure: STONE EXTRACTION WITH BASKET;  Surgeon: Abbie Sons, MD;  Location: [facility];  Service: Urology;  Laterality: Right;  . TOTAL MASTECTOMY Bilateral 08/27/2019   Procedure: TOTAL MASTECTOMY;  Surgeon: Herbert Pun, MD;  Location: [facility];  Service: General;  Laterality: Bilateral;  START _0  DUE TO SENTINEL NODE  . UPPER GI ENDOSCOPY      There were no vitals filed for this visit.   Subjective Assessment - 04/25/20 0826    Subjective Pt reports no pain currently. Has been completing HEP.    Pertinent History Pt is a 55  year old female s/p L proximal humerus fracture 12/02/10. PMH double masectomy 08/27/19 with expander placement and reconstruction finished 01/03/20.Pt fell in [place] walking straight forward onto her shoulder. Patient reports fracture was non-displaced and she did not have surgery, by Dr. Adrian Prince advisement, wore sling 6 weeks. Currently does not know orthopedic expectations for weight restrictions/movement resistrictions, but has not been lifting more than 1/2  gallon of milk, but is no longer wearing sling. 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 Patient reports motion is very restricted and she is unable to don/doff clothes, her husband washes her hair for her, unable to push/pull, or lift anything. Pt is an avid golfer, and walks often. Worst shoulder pain over the past week 8/10 best 0/10 but reports she avoids painful movement.  Pt is R hand dominant. Pt denies N/V, B&B changes, unexplained weight fluctuation, saddle paresthesia, fever, night sweats, or unrelenting night pain at this time.    Limitations Lifting;House hold activities;Writing    How long can you sit comfortably? unlimited    How long can you stand comfortably? unlimited    How long can you walk comfortably? unlimited    Diagnostic tests Xrays [facility] 12/03/19    Patient Stated Goals Full ROM to complete self care    Pain Onset More than a month ago             Ther-Ex TRX flex/abd walkouts x12 Total gym "pull ups"L12 2 x8/6; L13 x6 with cuing for eccentric lower; 10sec hand at end of sets Y on wall x12 Seated OMEGA chest/ slight overhead press 15# 3x 8/7/6  Manual STM withtrigger point releaseto mid/lower trap fibers, and latissimus PROM all directions                          PT Education - 04/25/20 1035    Education Details therex form/technique    Person(s) Educated Patient    Methods Explanation;Demonstration;Verbal cues    Comprehension Verbalized understanding;Returned demonstration;Verbal cues required            PT Short Term Goals - 01/25/20 1002      PT SHORT TERM GOAL #1   Title Pt will be independent with HEP in order to improve strength and decrease pain in order to improve pain-free function at home and work.    Baseline 01/23/20 HEP given    Time 4    Period Weeks    Status New             PT Long Term Goals - 01/25/20 1003      PT LONG TERM GOAL #1    Title Pt will demonstrate full active L shoulder ROM in order to complete self care and overhead ADLS    Baseline 42/35/36 flex 39d abd 39d  ER L ear IR L PSIS    Time 8    Period Weeks    Status New      PT LONG TERM GOAL #2   Title Pt will decrease worst pain as reported on NPRS by at least 3 points in order to demonstrate clinically significant reduction in pain.    Baseline 01/25/20 8/10    Time 8    Period Weeks    Status New      PT LONG TERM GOAL #3   Title Pt will demonstrate L gross shoulder strength of 5/5 in order to demonstrate PLOF and strength  needed for heavy household tasks    Baseline 01/23/20 flex 2+ abd 2+ ER 2+ IR 2+    Time 8    Period Weeks    Status New      PT LONG TERM GOAL #4   Title Patient will increase FOTO score to 79 to demonstrate predicted increase in functional mobility to complete ADLs                 Plan - 04/25/20 1038    Clinical Impression Statement Session shortened d/t patient having to arrive to work early. PT continued therex progression for increased mobility and strength for overhead motions with success. patient is able to comply with all cuing for proper technique of therex with good motivation throughout session and no increased pain. Patient is continuing to demonstrate steady progress, with less gaurding following MRI results. PT will continue progression as able.    Personal Factors and Comorbidities Comorbidity 1;Comorbidity 2;Comorbidity 3+;Past/Current Experience;Fitness;Time since onset of injury/illness/exacerbation    Comorbidities breast cancer, HTN, GERD, anxiety    Examination-Activity Limitations Bathing;Lift;Transfers;Reach Overhead;Carry;Dressing    Examination-Participation Restrictions Yard Work;Cleaning;Community Activity;Driving;Occupation;Laundry    Stability/Clinical Decision Making Evolving/Moderate complexity    Clinical Decision Making Moderate    Rehab Potential Good    PT Frequency 2x / week    PT  Duration 8 weeks    PT Treatment/Interventions ADLs/Self Care Home Management;Electrical Stimulation;Therapeutic exercise;Balance training;Joint Manipulations;Taping;Spinal Manipulations;Cryotherapy;Iontophoresis 5m/ml Dexamethasone;DME Instruction;Neuromuscular re-education;Gait training;Stair training;Functional mobility training;Moist Heat;Traction;Ultrasound;Therapeutic activities;Patient/family education;Manual techniques;Dry needling;Passive range of motion;Vestibular    PT Next Visit Plan PROM/AAROM    PT Home Exercise Plan see 04/07/20 note    Consulted and Agree with Plan of Care Patient           Patient will benefit from skilled therapeutic intervention in order to improve the following deficits and impairments:  Decreased balance,Impaired flexibility,Impaired UE functional use,Hypomobility,Decreased strength,Decreased range of motion,Decreased endurance,Decreased activity tolerance,Postural dysfunction,Increased muscle spasms,Hypermobility,Decreased mobility,Decreased coordination,Abnormal gait,Pain,Improper body mechanics,Increased fascial restricitons  Visit Diagnosis: Acute pain of left shoulder  Stiffness of left shoulder, not elsewhere classified     Problem List Patient Active Problem List   Diagnosis Date Noted  . Breast asymmetry following reconstructive surgery 03/25/2020  . S/P breast reconstruction, bilateral 03/06/2020  . Sebaceous cyst 12/31/2019  . Breast wound 10/05/2019  . Acquired absence of bilateral breasts and nipples 09/04/2019  . Genetic testing 08/10/2019  . Monoallelic mutation of CHEK2 gene in female patient 08/07/2019  . Malignant neoplasm of upper-outer quadrant of right breast in female, estrogen receptor positive ([place] 07/30/2019  . Goals of care, counseling/discussion 07/30/2019  . Family history of prostate cancer   . Family history of colon cancer   . Breast cancer ([place] 07/25/2019  . Left lower quadrant pain 05/23/2018  . Personal  history of kidney stones 04/20/2018  . Microscopic hematuria 04/20/2018  . 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 Overweight (BMI 25.0-29.9) 01/22/2017  . Encounter for preventive health examination 06/30/2016  . Renal colic 003/00/9233 . Ureteral calculus 04/12/2015  . Nephrolithiasis 03/04/2015  . Intractable migraine with aura without status migrainosus 10/29/2013  . Lack of libido 06/24/2013  . Migraine syndrome 03/11/2013  . Generalized anxiety disorder 03/11/2013   CDurwin RegesDPT CDurwin Reges3/18/2022, 10:42 AM  [facility] [address] [place] [address]Phone: [phone]  Fax:  [phone] Name: TJazlynne MillinerMRN: [id number] of Birth: 105-09-1965

## 2020-04-28 ENCOUNTER — Encounter (HOSPITAL_BASED_OUTPATIENT_CLINIC_OR_DEPARTMENT_OTHER): Payer: Self-pay | Admitting: Plastic Surgery

## 2020-04-28 ENCOUNTER — Other Ambulatory Visit: Payer: Self-pay | Admitting: Oncology

## 2020-04-28 ENCOUNTER — Other Ambulatory Visit: Payer: Self-pay

## 2020-04-30 ENCOUNTER — Other Ambulatory Visit (HOSPITAL_COMMUNITY): Payer: BC Managed Care – PPO

## 2020-05-05 ENCOUNTER — Encounter (HOSPITAL_BASED_OUTPATIENT_CLINIC_OR_DEPARTMENT_OTHER)
Admission: RE | Disposition: A | Discharge status: Home / Self Care | Payer: Self-pay | Source: Home / Self Care | Attending: Plastic Surgery

## 2020-05-05 ENCOUNTER — Ambulatory Visit (HOSPITAL_BASED_OUTPATIENT_CLINIC_OR_DEPARTMENT_OTHER): Payer: BC Managed Care – PPO | Admitting: Anesthesiology

## 2020-05-05 ENCOUNTER — Ambulatory Visit (HOSPITAL_BASED_OUTPATIENT_CLINIC_OR_DEPARTMENT_OTHER)
Admission: RE | Admit: 2020-05-05 | Discharge: 2020-05-05 | Disposition: A | Discharge status: Home / Self Care | Payer: BC Managed Care – PPO | Attending: Plastic Surgery | Admitting: Plastic Surgery

## 2020-05-05 ENCOUNTER — Other Ambulatory Visit: Payer: Self-pay

## 2020-05-05 ENCOUNTER — Other Ambulatory Visit: Payer: Self-pay | Admitting: Surgical

## 2020-05-05 ENCOUNTER — Encounter (HOSPITAL_BASED_OUTPATIENT_CLINIC_OR_DEPARTMENT_OTHER): Payer: Self-pay | Admitting: Plastic Surgery

## 2020-05-05 DIAGNOSIS — Z806 Family history of leukemia: Secondary | ICD-10-CM | POA: Insufficient documentation

## 2020-05-05 DIAGNOSIS — Z8249 Family history of ischemic heart disease and other diseases of the circulatory system: Secondary | ICD-10-CM | POA: Insufficient documentation

## 2020-05-05 DIAGNOSIS — R7303 Prediabetes: Secondary | ICD-10-CM | POA: Insufficient documentation

## 2020-05-05 DIAGNOSIS — Z9013 Acquired absence of bilateral breasts and nipples: Secondary | ICD-10-CM | POA: Diagnosis not present

## 2020-05-05 DIAGNOSIS — Z8042 Family history of malignant neoplasm of prostate: Secondary | ICD-10-CM | POA: Diagnosis not present

## 2020-05-05 DIAGNOSIS — Z79899 Other long term (current) drug therapy: Secondary | ICD-10-CM | POA: Diagnosis not present

## 2020-05-05 DIAGNOSIS — Z853 Personal history of malignant neoplasm of breast: Secondary | ICD-10-CM | POA: Insufficient documentation

## 2020-05-05 DIAGNOSIS — Z823 Family history of stroke: Secondary | ICD-10-CM | POA: Diagnosis not present

## 2020-05-05 DIAGNOSIS — N651 Disproportion of reconstructed breast: Secondary | ICD-10-CM

## 2020-05-05 DIAGNOSIS — Z885 Allergy status to narcotic agent status: Secondary | ICD-10-CM | POA: Diagnosis not present

## 2020-05-05 DIAGNOSIS — Z882 Allergy status to sulfonamides status: Secondary | ICD-10-CM | POA: Insufficient documentation

## 2020-05-05 DIAGNOSIS — Z8261 Family history of arthritis: Secondary | ICD-10-CM | POA: Insufficient documentation

## 2020-05-05 DIAGNOSIS — Z800 Family history of malignant neoplasm of digestive organs: Secondary | ICD-10-CM | POA: Diagnosis not present

## 2020-05-05 HISTORY — PX: LIPOSUCTION WITH LIPOFILLING: SHX6436

## 2020-05-05 LAB — BASIC METABOLIC PANEL
Anion gap: 8 (ref 5–15)
BUN: 14 mg/dL (ref 6–20)
CO2: 27 mmol/L (ref 22–32)
Calcium: 9.3 mg/dL (ref 8.9–10.3)
Chloride: 106 mmol/L (ref 98–111)
Creatinine, Ser: 0.86 mg/dL (ref 0.44–1.00)
GFR, Estimated: >60 mL/min (ref >60)
Glucose, Bld: 109 mg/dL — ABNORMAL HIGH (ref 70–99)
Potassium: 3.8 mmol/L (ref 3.5–5.1)
Sodium: 141 mmol/L (ref 135–145)

## 2020-05-05 LAB — POCT PREGNANCY, URINE: Preg Test, Ur: NEGATIVE

## 2020-05-05 LAB — GLUCOSE, CAPILLARY: Glucose-Capillary: 121 mg/dL — ABNORMAL HIGH (ref 70–99)

## 2020-05-05 SURGERY — LIPOSUCTION, WITH FAT TRANSFER
Anesthesia: General | Site: Breast | Laterality: Bilateral

## 2020-05-05 MED ORDER — LIDOCAINE HCL 1 % IJ SOLN
INTRAVENOUS | Status: DC | PRN
  Administered 2020-05-05: 900 mL

## 2020-05-05 MED ORDER — PROPOFOL 10 MG/ML IV BOLUS
INTRAVENOUS | Status: DC | PRN
  Administered 2020-05-05: 150 mg via INTRAVENOUS

## 2020-05-05 MED ORDER — FENTANYL CITRATE (PF) 100 MCG/2ML IJ SOLN
25.0000 ug | INTRAMUSCULAR | Status: DC | PRN
Start: 2020-05-05 — End: 2020-05-05

## 2020-05-05 MED ORDER — SODIUM CHLORIDE 0.9 % IV SOLN
250.0000 mL | INTRAVENOUS | Status: DC | PRN
Start: 2020-05-05 — End: 2020-05-05

## 2020-05-05 MED ORDER — SUGAMMADEX SODIUM 200 MG/2ML IV SOLN
INTRAVENOUS | Status: DC | PRN
  Administered 2020-05-05: 150 mg via INTRAVENOUS

## 2020-05-05 MED ORDER — FENTANYL CITRATE (PF) 100 MCG/2ML IJ SOLN
INTRAMUSCULAR | Status: AC
  Filled 2020-05-05: qty 2

## 2020-05-05 MED ORDER — SODIUM CHLORIDE 0.9% FLUSH: 3.0000 mL | INTRAVENOUS | Status: DC | PRN

## 2020-05-05 MED ORDER — FENTANYL CITRATE (PF) 100 MCG/2ML IJ SOLN
INTRAMUSCULAR | Status: DC | PRN
  Administered 2020-05-05: 50 ug via INTRAVENOUS
  Administered 2020-05-05: 100 ug via INTRAVENOUS
  Administered 2020-05-05: 50 ug via INTRAVENOUS

## 2020-05-05 MED ORDER — PHENYLEPHRINE 40 MCG/ML (10ML) SYRINGE FOR IV PUSH (FOR BLOOD PRESSURE SUPPORT)
PREFILLED_SYRINGE | INTRAVENOUS | Status: AC
  Filled 2020-05-05: qty 10

## 2020-05-05 MED ORDER — ROCURONIUM BROMIDE 10 MG/ML (PF) SYRINGE
PREFILLED_SYRINGE | INTRAVENOUS | Status: AC
  Filled 2020-05-05: qty 20

## 2020-05-05 MED ORDER — CHLORHEXIDINE GLUCONATE CLOTH 2 % EX PADS: 6.0000 | MEDICATED_PAD | Freq: Once | CUTANEOUS | Status: DC

## 2020-05-05 MED ORDER — CEFAZOLIN SODIUM-DEXTROSE 2-4 GM/100ML-% IV SOLN
INTRAVENOUS | Status: AC
  Filled 2020-05-05: qty 100

## 2020-05-05 MED ORDER — LIDOCAINE 2% (20 MG/ML) 5 ML SYRINGE
INTRAMUSCULAR | Status: AC
  Filled 2020-05-05: qty 10

## 2020-05-05 MED ORDER — LIDOCAINE HCL (CARDIAC) PF 100 MG/5ML IV SOSY
PREFILLED_SYRINGE | INTRAVENOUS | Status: DC | PRN
  Administered 2020-05-05: 60 mg via INTRAVENOUS

## 2020-05-05 MED ORDER — MIDAZOLAM HCL 5 MG/5ML IJ SOLN
INTRAMUSCULAR | Status: DC | PRN
  Administered 2020-05-05: 2 mg via INTRAVENOUS

## 2020-05-05 MED ORDER — ONDANSETRON HCL 4 MG/2ML IJ SOLN
4.0000 mg | Freq: Four times a day (QID) | INTRAMUSCULAR | Status: DC | PRN
Start: 2020-05-05 — End: 2020-05-05

## 2020-05-05 MED ORDER — ROCURONIUM BROMIDE 100 MG/10ML IV SOLN
INTRAVENOUS | Status: DC | PRN
  Administered 2020-05-05: 50 mg via INTRAVENOUS

## 2020-05-05 MED ORDER — LACTATED RINGERS IV SOLN: INTRAVENOUS | Status: DC

## 2020-05-05 MED ORDER — PHENYLEPHRINE HCL (PRESSORS) 10 MG/ML IV SOLN
INTRAVENOUS | Status: AC
  Filled 2020-05-05: qty 1

## 2020-05-05 MED ORDER — MIDAZOLAM HCL 2 MG/2ML IJ SOLN
INTRAMUSCULAR | Status: AC
  Filled 2020-05-05: qty 2

## 2020-05-05 MED ORDER — CEPHALEXIN 500 MG PO CAPS: 500.0000 mg | ORAL_CAPSULE | Freq: Four times a day (QID) | ORAL | 0 refills | Status: AC

## 2020-05-05 MED ORDER — ONDANSETRON HCL 4 MG/2ML IJ SOLN
INTRAMUSCULAR | Status: AC
  Filled 2020-05-05: qty 2

## 2020-05-05 MED ORDER — SODIUM CHLORIDE 0.9% FLUSH: 3.0000 mL | Freq: Two times a day (BID) | INTRAVENOUS | Status: DC

## 2020-05-05 MED ORDER — FENTANYL CITRATE (PF) 100 MCG/2ML IJ SOLN: 25.0000 ug | INTRAMUSCULAR | Status: DC | PRN

## 2020-05-05 MED ORDER — CEFAZOLIN SODIUM-DEXTROSE 2-4 GM/100ML-% IV SOLN
2.0000 g | INTRAVENOUS | Status: AC
  Administered 2020-05-05: 2 g via INTRAVENOUS

## 2020-05-05 MED ORDER — ONDANSETRON HCL 4 MG/2ML IJ SOLN
INTRAMUSCULAR | Status: DC | PRN
  Administered 2020-05-05: 4 mg via INTRAVENOUS

## 2020-05-05 MED ORDER — DEXAMETHASONE SODIUM PHOSPHATE 4 MG/ML IJ SOLN
INTRAMUSCULAR | Status: DC | PRN
  Administered 2020-05-05: 8 mg via INTRAVENOUS

## 2020-05-05 MED ORDER — LIDOCAINE-EPINEPHRINE 1 %-1:100000 IJ SOLN
INTRAMUSCULAR | Status: DC | PRN
  Administered 2020-05-05: 8 mL

## 2020-05-05 MED ORDER — EPHEDRINE 5 MG/ML INJ
INTRAVENOUS | Status: AC
  Filled 2020-05-05: qty 10

## 2020-05-05 SURGICAL SUPPLY — 54 items
ADH SKN CLS APL DERMABOND .7 (GAUZE/BANDAGES/DRESSINGS) ×1
BINDER ABDOMINAL  9 SM 30-45 (SOFTGOODS) ×2
BINDER ABDOMINAL 10 UNV 27-48 (MISCELLANEOUS) IMPLANT
BINDER ABDOMINAL 12 SM 30-45 (SOFTGOODS) IMPLANT
BINDER ABDOMINAL 9 SM 30-45 (SOFTGOODS) IMPLANT
BINDER BREAST LRG (GAUZE/BANDAGES/DRESSINGS) ×1 IMPLANT
BINDER BREAST MEDIUM (GAUZE/BANDAGES/DRESSINGS) IMPLANT
BINDER BREAST XLRG (GAUZE/BANDAGES/DRESSINGS) IMPLANT
BINDER BREAST XXLRG (GAUZE/BANDAGES/DRESSINGS) IMPLANT
BLADE HEX COATED 2.75 (ELECTRODE) IMPLANT
BLADE SURG 15 STRL LF DISP TIS (BLADE) ×1 IMPLANT
BLADE SURG 15 STRL SS (BLADE) ×2
COVER BACK TABLE 60X90IN (DRAPES) ×2 IMPLANT
COVER MAYO STAND STRL (DRAPES) ×2 IMPLANT
COVER WAND RF STERILE (DRAPES) IMPLANT
DECANTER SPIKE VIAL GLASS SM (MISCELLANEOUS) IMPLANT
DERMABOND ADVANCED (GAUZE/BANDAGES/DRESSINGS) ×1
DERMABOND ADVANCED .7 DNX12 (GAUZE/BANDAGES/DRESSINGS) ×1 IMPLANT
DRAPE LAPAROSCOPIC ABDOMINAL (DRAPES) ×2 IMPLANT
DRSG PAD ABDOMINAL 8X10 ST (GAUZE/BANDAGES/DRESSINGS) ×4 IMPLANT
ELECT REM PT RETURN 9FT ADLT (ELECTROSURGICAL) ×2
ELECTRODE REM PT RTRN 9FT ADLT (ELECTROSURGICAL) ×1 IMPLANT
EXTRACTOR CANIST REVOLVE STRL (CANNISTER) ×2 IMPLANT
GLOVE SURG ENC MOIS LTX SZ6.5 (GLOVE) ×5 IMPLANT
GLOVE SURG UNDER POLY LF SZ6.5 (GLOVE) ×1 IMPLANT
GLOVE SURG UNDER POLY LF SZ7 (GLOVE) ×2 IMPLANT
GOWN STRL REUS W/ TWL LRG LVL3 (GOWN DISPOSABLE) ×2 IMPLANT
GOWN STRL REUS W/ TWL XL LVL3 (GOWN DISPOSABLE) IMPLANT
GOWN STRL REUS W/TWL LRG LVL3 (GOWN DISPOSABLE) ×4
GOWN STRL REUS W/TWL XL LVL3 (GOWN DISPOSABLE) ×4
IV LACTATED RINGERS 1000ML (IV SOLUTION) ×4 IMPLANT
LINER CANISTER 1000CC FLEX (MISCELLANEOUS) ×4 IMPLANT
NDL HYPO 25X1 1.5 SAFETY (NEEDLE) IMPLANT
NDL SAFETY ECLIPSE 18X1.5 (NEEDLE) ×1 IMPLANT
NEEDLE HYPO 18GX1.5 SHARP (NEEDLE) ×2
NEEDLE HYPO 25X1 1.5 SAFETY (NEEDLE) ×2 IMPLANT
PACK BASIN DAY SURGERY FS (CUSTOM PROCEDURE TRAY) ×2 IMPLANT
PAD ALCOHOL SWAB (MISCELLANEOUS) ×2 IMPLANT
PAD FOAM SILICONE BACKED (GAUZE/BANDAGES/DRESSINGS) ×1 IMPLANT
PENCIL SMOKE EVACUATOR (MISCELLANEOUS) ×1 IMPLANT
SLEEVE SCD COMPRESS KNEE MED (STOCKING) ×2 IMPLANT
SPONGE LAP 18X18 RF (DISPOSABLE) ×3 IMPLANT
SUT MNCRL AB 4-0 PS2 18 (SUTURE) IMPLANT
SUT MON AB 5-0 PS2 18 (SUTURE) ×4 IMPLANT
SYR 10ML LL (SYRINGE) ×9 IMPLANT
SYR 3ML 18GX1 1/2 (SYRINGE) IMPLANT
SYR 50ML LL SCALE MARK (SYRINGE) ×3 IMPLANT
SYR CONTROL 10ML LL (SYRINGE) ×2 IMPLANT
SYR TOOMEY 50ML (SYRINGE) ×2 IMPLANT
TOWEL GREEN STERILE FF (TOWEL DISPOSABLE) ×4 IMPLANT
TRAY DSU PREP LF (CUSTOM PROCEDURE TRAY) ×2 IMPLANT
TUBING INFILTRATION IT-10001 (TUBING) ×1 IMPLANT
TUBING SET GRADUATE ASPIR 12FT (MISCELLANEOUS) ×2 IMPLANT
UNDERPAD 30X36 HEAVY ABSORB (UNDERPADS AND DIAPERS) ×4 IMPLANT

## 2020-05-05 NOTE — Anesthesia Preprocedure Evaluation (Signed)
Anesthesia Evaluation  Patient identified by MRN, date of birth, ID band Patient awake    Reviewed: Allergy & Precautions, H&P , NPO status , Patient's Chart, lab work & pertinent test results  Airway Mallampati: II   Neck ROM: full    Dental   Pulmonary neg pulmonary ROS,    breath sounds clear to auscultation       Cardiovascular hypertension,  Rhythm:regular Rate:Normal     Neuro/Psych  Headaches, PSYCHIATRIC DISORDERS Anxiety Depression    GI/Hepatic GERD  ,  Endo/Other    Renal/GU stones     Musculoskeletal   Abdominal   Peds  Hematology   Anesthesia Other Findings   Reproductive/Obstetrics                             Anesthesia Physical Anesthesia Plan  ASA: II  Anesthesia Plan: General   Post-op Pain Management:    Induction: Intravenous  PONV Risk Score and Plan: 3 and Ondansetron, Dexamethasone, Midazolam and Treatment may vary due to age or medical condition  Airway Management Planned: Oral ETT  Additional Equipment:   Intra-op Plan:   Post-operative Plan: Extubation in OR  Informed Consent: I have reviewed the patients History and Physical, chart, labs and discussed the procedure including the risks, benefits and alternatives for the proposed anesthesia with the patient or authorized representative who has indicated his/her understanding and acceptance.     Dental advisory given  Plan Discussed with: CRNA, Anesthesiologist and Surgeon  Anesthesia Plan Comments:         Anesthesia Quick Evaluation

## 2020-05-05 NOTE — Transfer of Care (Signed)
Immediate Anesthesia Transfer of Care Note  Patient: Tiffany Acevedo  Procedure(s) Performed: LIPOSUCTION WITH LIPOFILLING; LIPOSUCTION OF EXCESS LATERAL BREAST TISSUE (Bilateral Breast)  Patient Location: PACU  Anesthesia Type:General  Level of Consciousness: drowsy, patient cooperative and responds to stimulation  Airway & Oxygen Therapy: Patient Spontanous Breathing and Patient connected to face mask oxygen  Post-op Assessment: Report given to RN and Post -op Vital signs reviewed and stable  Post vital signs: Reviewed and stable  Last Vitals:  Vitals Value Taken Time  BP    Temp    Pulse 95 05/05/20 1448  Resp 9 05/05/20 1448  SpO2 91 % 05/05/20 1448  Vitals shown include unvalidated device data.  Last Pain:  Vitals:   05/05/20 1101  TempSrc: Oral  PainSc: 0-No pain         Complications: No complications documented.

## 2020-05-05 NOTE — Op Note (Signed)
DATE OF OPERATION: 05/05/2020  LOCATION: [facility]  PREOPERATIVE DIAGNOSIS: Breast asymmetry after treatment for breast cancer and mastectomy  POSTOPERATIVE DIAGNOSIS: Same  PROCEDURE: Lipo filling of bilateral breasts with liposuction lateral breasts for improved symmetry  SURGEON: Lillyann Ahart Sanger Shemeika Starzyk, DO  ASSISTANT: Roetta Sessions, PA  EBL: 1 cc  CONDITION: Stable  COMPLICATIONS: None  INDICATION: The patient, Tiffany Acevedo, is a 55 y.o. female born on 26-Jun-1965, is here for treatment after bilateral mastectomies.  She was treated for breast cancer.  She had reconstruction with expanders and then implants.  She had some residual asymmetry and loss of volume of the upper and medial pole on both sides.  She has a slight bit of excess tissue on the lateral aspect of both breasts.Marland Kitchen   PROCEDURE DETAILS:  The patient was seen prior to surgery and marked.  The IV antibiotics were given. The patient was taken to the operating room and given a general anesthetic. A standard time out was performed and all information was confirmed by those in the room. SCDs were placed.   The chest and abdomen were prepped with Betadine.  1% lidocaine with epinephrine was injected around the umbilical area inferiorly and the breast at the inframammary fold laterally and then medially at the previous incision site.  A 15 blade was used to make a 2 mm incision in each spot mentioned.  Tumescent was placed in the abdomen and lateral breasts.  Once the local tumescent had time to work the liposuction was performed to harvest the fat from the abdomen.  The fat was collected and the revolve device and prepared according to the manufacture guidelines.  We had 3 washes done to prepare the fat.  The adipose cells were then collected in 10 cc syringes.  Using the cannula fat was injected into the medial superior and medial inferior portion of the breast for a total of 100 cc on the right and 90 cc on  the left.  Then the liposuction was performed laterally for improved contour.  All incisions were closed with 5-0 Monocryl.  TopiFoam was applied with Dermabond to the incision sites.  The patient was placed in a breast binder and an abdominal binder.  The patient was allowed to wake up and taken to recovery room in stable condition at the end of the case. The family was notified at the end of the case.   The advanced practice practitioner (APP) assisted throughout the case.  The APP was essential in retraction and counter traction when needed to make the case progress smoothly.  This retraction and assistance made it possible to see the tissue plans for the procedure.  The assistance was needed for blood control, tissue re-approximation and assisted with closure of the incision site.

## 2020-05-05 NOTE — Discharge Instructions (Addendum)

## 2020-05-05 NOTE — Interval H&P Note (Signed)
History and Physical Interval Note:  05/05/2020 10:58 AM  Tiffany Acevedo  has presented today for surgery, with the diagnosis of status post breast reconstruction.  The various methods of treatment have been discussed with the patient and family. After consideration of risks, benefits and other options for treatment, the patient has consented to  Procedure(s) with comments: LIPOSUCTION WITH LIPOFILLING;  EXCISION OF EXCESS LATERAL BREAST TISSUE (Bilateral) - 90 min as a surgical intervention.  The patient's history has been reviewed, patient examined, no change in status, stable for surgery.  I have reviewed the patient's chart and labs.  Questions were answered to the patient's satisfaction.     Loel Lofty Torie Priebe

## 2020-05-05 NOTE — Anesthesia Procedure Notes (Signed)
Procedure Name: Intubation Performed by: Ezequiel Kayser, CRNA Pre-anesthesia Checklist: Patient identified, Emergency Drugs available, Suction available and Patient being monitored Patient Re-evaluated:Patient Re-evaluated prior to induction Oxygen Delivery Method: Circle System Utilized Preoxygenation: Pre-oxygenation with 100% oxygen Induction Type: IV induction Ventilation: Mask ventilation without difficulty Laryngoscope Size: Mac Grade View: Grade I Tube type: Oral Number of attempts: 1 Airway Equipment and Method: Stylet and Oral airway Placement Confirmation: ETT inserted through vocal cords under direct vision,  positive ETCO2 and breath sounds checked- equal and bilateral Secured at: 21 cm Tube secured with: Tape Dental Injury: Teeth and Oropharynx as per pre-operative assessment

## 2020-05-06 ENCOUNTER — Encounter (HOSPITAL_BASED_OUTPATIENT_CLINIC_OR_DEPARTMENT_OTHER): Payer: Self-pay | Admitting: Plastic Surgery

## 2020-05-06 NOTE — Addendum Note (Signed)
Addendum  created 05/06/20 1202 by Zarif Rathje, Ernesta Amble, CRNA   Charge Capture section accepted

## 2020-05-06 NOTE — Anesthesia Postprocedure Evaluation (Signed)
Anesthesia Post Note  Patient: Tiffany Acevedo  Procedure(s) Performed: LIPOSUCTION WITH LIPOFILLING; LIPOSUCTION OF EXCESS LATERAL BREAST TISSUE (Bilateral Breast)     Patient location during evaluation: PACU Anesthesia Type: General Level of consciousness: sedated and patient cooperative Pain management: pain level controlled Vital Signs Assessment: post-procedure vital signs reviewed and stable Respiratory status: spontaneous breathing Cardiovascular status: stable Anesthetic complications: no   No complications documented.  Last Vitals:  Vitals:   05/05/20 1500 05/05/20 1554  BP: 132/65 (!) 153/84  Pulse: 91 88  Resp: 20 17  Temp:  37.2 C  SpO2: 97% 96%    Last Pain:  Vitals:   05/05/20 1536  TempSrc:   PainSc: 0-No pain                 Nolon Nations

## 2020-05-20 ENCOUNTER — Ambulatory Visit (INDEPENDENT_AMBULATORY_CARE_PROVIDER_SITE_OTHER): Payer: BC Managed Care – PPO | Admitting: Plastic Surgery

## 2020-05-20 ENCOUNTER — Encounter: Payer: Self-pay | Admitting: Plastic Surgery

## 2020-05-20 ENCOUNTER — Other Ambulatory Visit: Payer: Self-pay

## 2020-05-20 ENCOUNTER — Ambulatory Visit: Payer: BC Managed Care – PPO | Admitting: Physical Therapy

## 2020-05-20 VITALS — BP 134/80 | HR 91

## 2020-05-20 DIAGNOSIS — N651 Disproportion of reconstructed breast: Secondary | ICD-10-CM

## 2020-05-20 NOTE — Progress Notes (Signed)
The patient is a 55 year old female here for follow-up after undergoing Lipo filling of her breast.  She is overall doing really well.  She says it was less painful and she thought it would be.  She has a little bit of bruising of her breast and abdomen.  She is doing great with wearing a spanks and sports bra.  No sign of infection.  She is ready to move ahead with nipple areola tattoo placement.  Pictures were obtained of the patient and placed in the chart with the patient's or guardian's permission.

## 2020-05-21 ENCOUNTER — Ambulatory Visit: Payer: BC Managed Care – PPO | Attending: Oncology | Admitting: Physical Therapy

## 2020-05-21 ENCOUNTER — Encounter: Payer: Self-pay | Admitting: Physical Therapy

## 2020-05-21 DIAGNOSIS — M25512 Pain in left shoulder: Secondary | ICD-10-CM | POA: Diagnosis not present

## 2020-05-21 DIAGNOSIS — M25612 Stiffness of left shoulder, not elsewhere classified: Secondary | ICD-10-CM

## 2020-05-21 NOTE — Therapy (Signed)
Tiffany Acevedo [address], [place], [address] Phone: [phone]   Fax:  [phone]  Physical Therapy Treatment/Progress Note  Reporting Period 03/26/20 - 05/21/20  Patient Details  Name: Tiffany Acevedo MRN: [id number] Date of Birth: [birth year] No data recorded  Encounter Date: 05/21/2020   PT End of Session - 05/21/20 1601    Visit Number 22    Number of Visits 45    Date for PT Re-Evaluation 07/18/20    Authorization Time Period no limit    Authorization - Visit Number 72    Authorization - Number of Visits 100    PT Start Time 1520    PT Stop Time 1600    PT Time Calculation (min) 40 min    Activity Tolerance Patient tolerated treatment well    Behavior During Therapy [facility] for tasks assessed/performed           Past Medical History:  Diagnosis Date  . Allergy   . Anxiety   . Breast cancer ([place])   . Depression   . Excessive weight gain 06/30/2016  . Family history of colon cancer   . Family history of colon cancer   . Family history of prostate cancer   . Frequent headaches   . GERD (gastroesophageal reflux disease)   . History of kidney stones   . Hypertension   . Migraines   . Pre-diabetes     Past Surgical History:  Procedure Laterality Date  . BREAST BIOPSY Right 07/13/2019   stereo biopsy/ x clip/path pending  . BREAST BIOPSY Right 07/13/2019   stereo biopsy/coil clip/ path pending  . BREAST RECONSTRUCTION WITH PLACEMENT OF TISSUE EXPANDER AND FLEX HD (ACELLULAR HYDRATED DERMIS) Bilateral 08/27/2019   Procedure: BILATERAL BREAST RECONSTRUCTION WITH PLACEMENT OF TISSUE EXPANDER AND FLEX HD (ACELLULAR HYDRATED DERMIS);  Surgeon: Wallace Going, DO;  Location: [facility];  Service: Plastics;  Laterality: Bilateral;  . [place], 2004, 2013  . COLONOSCOPY  2015  . CYSTOSCOPY W/ RETROGRADES Right 09/13/2018   Procedure: CYSTOSCOPY WITH RETROGRADE PYELOGRAM;   Surgeon: Abbie Sons, MD;  Location: [facility];  Service: Urology;  Laterality: Right;  . CYSTOSCOPY/URETEROSCOPY/HOLMIUM LASER/STENT PLACEMENT Left 05/09/2018   Procedure: CYSTOSCOPY/URETEROSCOPY/HOLMIUM LASER/STENT PLACEMENT;  Surgeon: Abbie Sons, MD;  Location: [facility];  Service: Urology;  Laterality: Left;  . CYSTOSCOPY/URETEROSCOPY/HOLMIUM LASER/STENT PLACEMENT Right 09/13/2018   Procedure: CYSTOSCOPY/URETEROSCOPY/HOLMIUM LASER/STENT PLACEMENT;  Surgeon: Abbie Sons, MD;  Location: [facility];  Service: Urology;  Laterality: Right;  . INCONTINENCE SURGERY     BLADDER TUCK  . LIPOSUCTION WITH LIPOFILLING Bilateral 05/05/2020   Procedure: LIPOSUCTION WITH LIPOFILLING; LIPOSUCTION OF EXCESS LATERAL BREAST TISSUE;  Surgeon: Wallace Going, DO;  Location: [place];  Service: Plastics;  Laterality: Bilateral;  90 min  . PILONIDAL CYST EXCISION    . REMOVAL OF BILATERAL TISSUE EXPANDERS WITH PLACEMENT OF BILATERAL BREAST IMPLANTS Bilateral 01/02/2020   Procedure: REMOVAL OF BILATERAL TISSUE EXPANDERS WITH PLACEMENT OF BILATERAL BREAST IMPLANTS;  Surgeon: Wallace Going, DO;  Location: [place];  Service: Plastics;  Laterality: Bilateral;  2 hours, please  . STONE EXTRACTION WITH BASKET Right 09/13/2018   Procedure: STONE EXTRACTION WITH BASKET;  Surgeon: Abbie Sons, MD;  Location: [facility];  Service: Urology;  Laterality: Right;  . TOTAL MASTECTOMY Bilateral 08/27/2019   Procedure: TOTAL MASTECTOMY;  Surgeon: Herbert Pun, MD;  Location: [facility];  Service: General;  Laterality:  Bilateral;  START @930  DUE TO SENTINEL NODE  . UPPER GI ENDOSCOPY      There were no vitals filed for this visit.   Subjective Assessment - 05/21/20 1526    Subjective Pt returns to to PT following absence d/t surgerical intervention for breast symmetry. She reports stiffness > pain. Reports she has not been able to do HEP d/t surgical precautions. Was  cleared by Dr. Marla Roe for PT today.    Pertinent History Pt is a 55 year old female s/p L proximal humerus fracture 12/02/10. PMH double masectomy 08/27/19 with expander placement and reconstruction finished 01/03/20.Pt fell in [place] walking straight forward onto her shoulder. Patient reports fracture was non-displaced and she did not have surgery, by Dr. Adrian Prince advisement, wore sling 6 weeks. Currently does not know orthopedic expectations for weight restrictions/movement resistrictions, but has not been lifting more than 1/2 gallon of milk, but is no longer wearing sling. 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 Patient reports motion is very restricted and she is unable to don/doff clothes, her husband washes her hair for her, unable to push/pull, or lift anything. Pt is an avid golfer, and walks often. Worst shoulder pain over the past week 8/10 best 0/10 but reports she avoids painful movement.  Pt is R hand dominant. Pt denies N/V, B&B changes, unexplained weight fluctuation, saddle paresthesia, fever, night sweats, or unrelenting night pain at this time.    Limitations Lifting;House hold activities;Writing    How long can you sit comfortably? unlimited    How long can you stand comfortably? unlimited    How long can you walk comfortably? unlimited    Diagnostic tests Xrays [facility] 12/03/19    Patient Stated Goals Full ROM to complete self care    Pain Onset More than a month ago             Ther-Ex TRX flex 62mns; abd 259ms with min cuing for posture with good carry over PT reviewed the following HEP with patient with patient able to demonstrate a set of the following with min cuing for correction needed. PT educated patient on parameters of therex (how/when to inc/decrease intensity, frequency, rep/set range, stretch hold time, and purpose of therex) with verbalized understanding.  Standing Shoulder Flexion Wall Walk - 2-3 x  daily - 7 x weekly - 12 reps - 2-3sec hold Standing Shoulder Abduction Finger Walk at Wall - 2-3 x daily - 7 x weekly - 12 reps - 2-3sec hold Standing Shoulder Internal Rotation Stretch with Towel - 2-3 x daily - 7 x weekly - 12 reps - 2-3sec hold Supine Shoulder External Rotation in 45 Degrees Abduction AAROM with [place] - 2-3 x daily - 7 x weekly - 12 reps - 2-3sec hold   Manual PROM all directions                          PT Education - 05/21/20 1600    Education Details therex form/technique, HEP update    Person(s) Educated Patient    Methods Explanation;Demonstration;Verbal cues    Comprehension Verbalized understanding;Returned demonstration;Verbal cues required            PT Short Term Goals - 05/21/20 1535      PT SHORT TERM GOAL #1   Title Pt will be independent with HEP in order to improve strength and decrease pain in order to improve pain-free function at home  and work.    Baseline 01/23/20 HEP given; 05/21/20 completing prior to surgery, HEP updated today    Time 4    Period Weeks    Status Revised      PT SHORT TERM GOAL #2   Title Pt will demonstrate full PROM to demonstrate joint mobility needed for for full AROM    Baseline 05/21/20 flex: 143 abd: 105 ER: 54 IR: 72    Status New             PT Long Term Goals - 05/21/20 1531      PT LONG TERM GOAL #1   Title Pt will demonstrate full active L shoulder ROM in order to complete self care and overhead ADLS    Baseline 25/05/39 flex 39d abd 39d  ER L ear IR L PSIS; 05/22/10 flex 116d abd 81d  ER C7 IR T12    Time 8    Period Weeks    Status On-going      PT LONG TERM GOAL #2   Title Pt will decrease worst pain as reported on NPRS by at least 3 points in order to demonstrate clinically significant reduction in pain.    Baseline 01/25/20 8/10; 05/21/20    Time 8    Period Weeks    Status On-going      PT LONG TERM GOAL #3   Title Pt will demonstrate L gross shoulder strength of  5/5 in order to demonstrate PLOF and strength needed for heavy household tasks    Baseline 01/23/20 flex 2+ abd 2+ ER 2+ IR 2+; 05/21/20 In availible range: flex: 4+ abd: 4- ER: 4+ IR: 4+    Time 8    Period Weeks    Status On-going      PT LONG TERM GOAL #4   Title Patient will increase FOTO score to 79 to demonstrate predicted increase in functional mobility to complete ADLs                 Plan - 05/21/20 1602    Clinical Impression Statement Reassessment completed d/t pt absence for 3 weeks following surgical intervention for breast symmetry. Patient demonstrates slightly improved PROM, and maintained AROM since last visit. Does demonstrate good strength in limited range. MRI denies concerns for adhesive capsulitis, and improvements in PROM indicate more likely delayed fracture healing and possible RTC involvement (healed by this time, no involvement seen on MRI), vs. adhesive capsulitis. PT will initiate progressive therex for remaining PROM/AROM and strengthening as long as patient continues to demonstrate progress. PT reviewed HEP with patient to aid in this with demonstrated and verbalized understanding of recommendations. PT will continue progression as able.    Personal Factors and Comorbidities Comorbidity 1;Comorbidity 2;Comorbidity 3+;Past/Current Experience;Fitness;Time since onset of injury/illness/exacerbation    Examination-Activity Limitations Bathing;Lift;Transfers;Reach Overhead;Carry;Dressing    Examination-Participation Restrictions Yard Work;Cleaning;Community Activity;Driving;Occupation;Laundry    Stability/Clinical Decision Making Evolving/Moderate complexity    Clinical Decision Making Moderate    Rehab Potential Good    PT Frequency 2x / week    PT Treatment/Interventions ADLs/Self Care Home Management;Electrical Stimulation;Therapeutic exercise;Balance training;Joint Manipulations;Taping;Spinal Manipulations;Cryotherapy;Iontophoresis 18m/ml Dexamethasone;DME  Instruction;Neuromuscular re-education;Gait training;Stair training;Functional mobility training;Moist Heat;Traction;Ultrasound;Therapeutic activities;Patient/family education;Manual techniques;Dry needling;Passive range of motion;Vestibular    PT Next Visit Plan PROM/AAROM    PT Home Exercise Plan see 04/07/20 note    Consulted and Agree with Plan of Care Patient           Patient will benefit from skilled therapeutic intervention in order to  improve the following deficits and impairments:  Decreased balance,Impaired flexibility,Impaired UE functional use,Hypomobility,Decreased strength,Decreased range of motion,Decreased endurance,Decreased activity tolerance,Postural dysfunction,Increased muscle spasms,Hypermobility,Decreased mobility,Decreased coordination,Abnormal gait,Pain,Improper body mechanics,Increased fascial restricitons  Visit Diagnosis: Acute pain of left shoulder  Stiffness of left shoulder, not elsewhere classified     Problem List Patient Active Problem List   Diagnosis Date Noted  . Breast asymmetry following reconstructive surgery 03/25/2020  . S/P breast reconstruction, bilateral 03/06/2020  . Sebaceous cyst 12/31/2019  . Breast wound 10/05/2019  . Acquired absence of bilateral breasts and nipples 09/04/2019  . Genetic testing 08/10/2019  . Monoallelic mutation of CHEK2 gene in female patient 08/07/2019  . Malignant neoplasm of upper-outer quadrant of right breast in female, estrogen receptor positive ([place]) 07/30/2019  . Goals of care, counseling/discussion 07/30/2019  . Family history of prostate cancer   . Family history of colon cancer   . Breast cancer ([place]) 07/25/2019  . Left lower quadrant pain 05/23/2018  . Personal history of kidney stones 04/20/2018  . Microscopic hematuria 04/20/2018  . 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 Overweight (BMI 25.0-29.9) 01/22/2017  . Encounter for preventive health examination 06/30/2016  . Renal colic  19/71/8569  . Ureteral calculus 04/12/2015  . Nephrolithiasis 03/04/2015  . Intractable migraine with aura without status migrainosus 10/29/2013  . Lack of libido 06/24/2013  . Migraine syndrome 03/11/2013  . Generalized anxiety disorder 03/11/2013   Tiffany Acevedo DPT Tiffany Acevedo 05/21/2020, 4:27 PM  [facility] [address] [place], [address] Phone: [phone]   Fax:  [phone]  Name: Tiffany Acevedo MRN: [id number] Date of Birth: 02-15-1965

## 2020-05-23 ENCOUNTER — Other Ambulatory Visit: Payer: Self-pay

## 2020-05-23 ENCOUNTER — Ambulatory Visit: Payer: BC Managed Care – PPO | Admitting: Physical Therapy

## 2020-05-23 DIAGNOSIS — M25512 Pain in left shoulder: Secondary | ICD-10-CM

## 2020-05-23 DIAGNOSIS — M25612 Stiffness of left shoulder, not elsewhere classified: Secondary | ICD-10-CM

## 2020-05-23 NOTE — Therapy (Signed)
[facility] [address], [place], [address] Phone: [phone]   Fax:  [phone]  Physical Therapy Treatment  Patient Details  Name: Tiffany Acevedo MRN: [id number] Date of Birth: 1965-09-01 No data recorded  Encounter Date: 05/23/2020   PT End of Session - 05/23/20 1144    Visit Number 23    Number of Visits 45    Date for PT Re-Evaluation 07/18/20    Authorization Time Period no limit    Authorization - Visit Number 23    Authorization - Number of Visits 100    PT Start Time 1100    PT Stop Time 1145    PT Time Calculation (min) 45 min    Activity Tolerance Patient tolerated treatment well    Behavior During Therapy [facility] for tasks assessed/performed           Past Medical History:  Diagnosis Date  . Allergy   . Anxiety   . Breast cancer ([place])   . Depression   . Excessive weight gain 06/30/2016  . Family history of colon cancer   . Family history of colon cancer   . Family history of prostate cancer   . Frequent headaches   . GERD (gastroesophageal reflux disease)   . History of kidney stones   . Hypertension   . Migraines   . Pre-diabetes     Past Surgical History:  Procedure Laterality Date  . BREAST BIOPSY Right 07/13/2019   stereo biopsy/ x clip/path pending  . BREAST BIOPSY Right 07/13/2019   stereo biopsy/coil clip/ path pending  . BREAST RECONSTRUCTION WITH PLACEMENT OF TISSUE EXPANDER AND FLEX HD (ACELLULAR HYDRATED DERMIS) Bilateral 08/27/2019   Procedure: BILATERAL BREAST RECONSTRUCTION WITH PLACEMENT OF TISSUE EXPANDER AND FLEX HD (ACELLULAR HYDRATED DERMIS);  Surgeon: Wallace Going, DO;  Location: [facility];  Service: Plastics;  Laterality: Bilateral;  . [place], 2004, 2013  . COLONOSCOPY  2015  . CYSTOSCOPY W/ RETROGRADES Right 09/13/2018   Procedure: CYSTOSCOPY WITH RETROGRADE PYELOGRAM;  Surgeon: Abbie Sons, MD;  Location: [facility];   Service: Urology;  Laterality: Right;  . CYSTOSCOPY/URETEROSCOPY/HOLMIUM LASER/STENT PLACEMENT Left 05/09/2018   Procedure: CYSTOSCOPY/URETEROSCOPY/HOLMIUM LASER/STENT PLACEMENT;  Surgeon: Abbie Sons, MD;  Location: [facility];  Service: Urology;  Laterality: Left;  . CYSTOSCOPY/URETEROSCOPY/HOLMIUM LASER/STENT PLACEMENT Right 09/13/2018   Procedure: CYSTOSCOPY/URETEROSCOPY/HOLMIUM LASER/STENT PLACEMENT;  Surgeon: Abbie Sons, MD;  Location: [facility];  Service: Urology;  Laterality: Right;  . INCONTINENCE SURGERY     BLADDER TUCK  . LIPOSUCTION WITH LIPOFILLING Bilateral 05/05/2020   Procedure: LIPOSUCTION WITH LIPOFILLING; LIPOSUCTION OF EXCESS LATERAL BREAST TISSUE;  Surgeon: Wallace Going, DO;  Location: [place];  Service: Plastics;  Laterality: Bilateral;  90 min  . PILONIDAL CYST EXCISION    . REMOVAL OF BILATERAL TISSUE EXPANDERS WITH PLACEMENT OF BILATERAL BREAST IMPLANTS Bilateral 01/02/2020   Procedure: REMOVAL OF BILATERAL TISSUE EXPANDERS WITH PLACEMENT OF BILATERAL BREAST IMPLANTS;  Surgeon: Wallace Going, DO;  Location: [place];  Service: Plastics;  Laterality: Bilateral;  2 hours, please  . STONE EXTRACTION WITH BASKET Right 09/13/2018   Procedure: STONE EXTRACTION WITH BASKET;  Surgeon: Abbie Sons, MD;  Location: [facility];  Service: Urology;  Laterality: Right;  . TOTAL MASTECTOMY Bilateral 08/27/2019   Procedure: TOTAL MASTECTOMY;  Surgeon: Herbert Pun, MD;  Location: [facility];  Service: General;  Laterality: Bilateral;  START _0  DUE TO SENTINEL  NODE  . UPPER GI ENDOSCOPY      There were no vitals filed for this visit.   Subjective Assessment - 05/23/20 1142    Subjective Pt reports compliance with HEP without pain since last session. Is going on vacation next week and would like some resistance therex to do with mobility therex    Pertinent History Pt is a 55 year old female s/p L proximal humerus fracture  12/02/10. PMH double masectomy 08/27/19 with expander placement and reconstruction finished 01/03/20.Pt fell in [place] walking straight forward onto her shoulder. Patient reports fracture was non-displaced and she did not have surgery, by Dr. Adrian Prince advisement, wore sling 6 weeks. Currently does not know orthopedic expectations for weight restrictions/movement resistrictions, but has not been lifting more than 1/2 gallon of milk, but is no longer wearing sling. 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 Patient reports motion is very restricted and she is unable to don/doff clothes, her husband washes her hair for her, unable to push/pull, or lift anything. Pt is an avid golfer, and walks often. Worst shoulder pain over the past week 8/10 best 0/10 but reports she avoids painful movement.  Pt is R hand dominant. Pt denies N/V, B&B changes, unexplained weight fluctuation, saddle paresthesia, fever, night sweats, or unrelenting night pain at this time.    Limitations Lifting;House hold activities;Writing    How long can you sit comfortably? unlimited    How long can you stand comfortably? unlimited    How long can you walk comfortably? unlimited    Diagnostic tests Xrays [facility] 12/03/19    Patient Stated Goals Full ROM to complete self care    Pain Onset More than a month ago             Ther-Ex TRX flex/abd walkouts x12 Total gym "pull ups"L11 2x 5; L12 x5 with good carry over of technique from previous sessions Y on wall 2x 10 with min cuing for elbow ext with good carry over High rows BluTB 2x 10 with min cuing for scapulohumeral rhythm with decent carry over Scaption with GTB 2x 6 with min cuing to prevent shoulder hiking with decent carry over  PT reviewed the following HEP with patient with patient able to demonstrate a set of the following with min cuing for correction needed. PT educated patient on parameters of therex (how/when to  inc/decrease intensity, frequency, rep/set range, stretch hold time, and purpose of therex) with verbalized understanding.  Low Trap Setting at [place] - 1 x daily - 2-3 x weekly - 3 sets - 10 reps Standing High Row with Resistance - 1 x daily - 2-3 x weekly - 3 sets - 10 reps Seated Shoulder Scaption - 1 x daily - 7 x weekly - 3 sets - 10 reps   Manual PROM all directions Some popping present with end range flexion, no apprehension + relocation or sulcus present                        PT Education - 05/23/20 1143    Education Details therex form/technique, HEP review    Person(s) Educated Patient    Methods Explanation;Demonstration;Verbal cues    Comprehension Verbalized understanding;Returned demonstration;Verbal cues required            PT Short Term Goals - 05/21/20 1535      PT SHORT TERM GOAL #1   Title Pt will be independent with  HEP in order to improve strength and decrease pain in order to improve pain-free function at home and work.    Baseline 01/23/20 HEP given; 05/21/20 completing prior to surgery, HEP updated today    Time 4    Period Weeks    Status Revised      PT SHORT TERM GOAL #2   Title Pt will demonstrate full PROM to demonstrate joint mobility needed for for full AROM    Baseline 05/21/20 flex: 143 abd: 105 ER: 54 IR: 72    Status New             PT Long Term Goals - 05/21/20 1531      PT LONG TERM GOAL #1   Title Pt will demonstrate full active L shoulder ROM in order to complete self care and overhead ADLS    Baseline 16/10/96 flex 39d abd 39d  ER L ear IR L PSIS; 05/22/10 flex 116d abd 81d  ER C7 IR T12    Time 8    Period Weeks    Status On-going      PT LONG TERM GOAL #2   Title Pt will decrease worst pain as reported on NPRS by at least 3 points in order to demonstrate clinically significant reduction in pain.    Baseline 01/25/20 8/10; 05/21/20    Time 8    Period Weeks    Status On-going      PT LONG TERM GOAL #3    Title Pt will demonstrate L gross shoulder strength of 5/5 in order to demonstrate PLOF and strength needed for heavy household tasks    Baseline 01/23/20 flex 2+ abd 2+ ER 2+ IR 2+; 05/21/20 In availible range: flex: 4+ abd: 4- ER: 4+ IR: 4+    Time 8    Period Weeks    Status On-going      PT LONG TERM GOAL #4   Title Patient will increase FOTO score to 79 to demonstrate predicted increase in functional mobility to complete ADLs                 Plan - 05/23/20 1147    Clinical Impression Statement PT initiated therex progression for overhead mobility, strengthening, and scapulohumeral rhythm with success. Pt is able to complete all therex with proper tehcnique following cuing with good motivation and no increased pain throughout session. Patient with difficulty preventing scapular elevation and shoulder hiking with overhead motions, but able to somewhat correct. Of note patient had some palable and audible popping with end range passive flexion, no sulcus or apprehension + relocation, and no maintained pain following. PT will continue to monitor this. PT will continue progression as able.    Personal Factors and Comorbidities Comorbidity 1;Comorbidity 2;Comorbidity 3+;Past/Current Experience;Fitness;Time since onset of injury/illness/exacerbation    Comorbidities breast cancer, HTN, GERD, anxiety    Examination-Activity Limitations Bathing;Lift;Transfers;Reach Overhead;Carry;Dressing    Examination-Participation Restrictions Yard Work;Cleaning;Community Activity;Driving;Occupation;Laundry    Stability/Clinical Decision Making Evolving/Moderate complexity    Clinical Decision Making Moderate    Rehab Potential Good    PT Frequency 2x / week    PT Duration 8 weeks    PT Treatment/Interventions ADLs/Self Care Home Management;Electrical Stimulation;Therapeutic exercise;Balance training;Joint Manipulations;Taping;Spinal Manipulations;Cryotherapy;Iontophoresis 29m/ml Dexamethasone;DME  Instruction;Neuromuscular re-education;Gait training;Stair training;Functional mobility training;Moist Heat;Traction;Ultrasound;Therapeutic activities;Patient/family education;Manual techniques;Dry needling;Passive range of motion;Vestibular    PT Next Visit Plan PROM/AAROM    PT Home Exercise Plan see 04/07/20 note    Consulted and Agree with Plan of Care Patient  Patient will benefit from skilled therapeutic intervention in order to improve the following deficits and impairments:  Decreased balance,Impaired flexibility,Impaired UE functional use,Hypomobility,Decreased strength,Decreased range of motion,Decreased endurance,Decreased activity tolerance,Postural dysfunction,Increased muscle spasms,Hypermobility,Decreased mobility,Decreased coordination,Abnormal gait,Pain,Improper body mechanics,Increased fascial restricitons  Visit Diagnosis: Acute pain of left shoulder  Stiffness of left shoulder, not elsewhere classified     Problem List Patient Active Problem List   Diagnosis Date Noted  . Breast asymmetry following reconstructive surgery 03/25/2020  . S/P breast reconstruction, bilateral 03/06/2020  . Sebaceous cyst 12/31/2019  . Breast wound 10/05/2019  . Acquired absence of bilateral breasts and nipples 09/04/2019  . Genetic testing 08/10/2019  . Monoallelic mutation of CHEK2 gene in female patient 08/07/2019  . Malignant neoplasm of upper-outer quadrant of right breast in female, estrogen receptor positive ([place]) 07/30/2019  . Goals of care, counseling/discussion 07/30/2019  . Family history of prostate cancer   . Family history of colon cancer   . Breast cancer ([place]) 07/25/2019  . Left lower quadrant pain 05/23/2018  . Personal history of kidney stones 04/20/2018  . Microscopic hematuria 04/20/2018  . 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 Overweight (BMI 25.0-29.9) 01/22/2017  . Encounter for preventive health examination 06/30/2016  . Renal colic  51/89/8421  . Ureteral calculus 04/12/2015  . Nephrolithiasis 03/04/2015  . Intractable migraine with aura without status migrainosus 10/29/2013  . Lack of libido 06/24/2013  . Migraine syndrome 03/11/2013  . Generalized anxiety disorder 03/11/2013   Durwin Reges DPT Durwin Reges DPT Durwin Reges 05/23/2020, 11:51 AM  [place] [address], [place], [address] Phone: [phone]   Fax:  [phone]  Name: Shalyn Koral MRN: [id number] Date of Birth: 08-28-1965

## 2020-05-27 ENCOUNTER — Encounter: Payer: BC Managed Care – PPO | Admitting: Physical Therapy

## 2020-05-29 ENCOUNTER — Encounter: Payer: BC Managed Care – PPO | Admitting: Physical Therapy

## 2020-06-03 ENCOUNTER — Other Ambulatory Visit: Payer: Self-pay

## 2020-06-03 ENCOUNTER — Encounter: Payer: Self-pay | Admitting: Physical Therapy

## 2020-06-03 ENCOUNTER — Encounter: Payer: Self-pay | Admitting: Surgical

## 2020-06-03 ENCOUNTER — Ambulatory Visit: Payer: BC Managed Care – PPO | Admitting: Physical Therapy

## 2020-06-03 ENCOUNTER — Ambulatory Visit (INDEPENDENT_AMBULATORY_CARE_PROVIDER_SITE_OTHER): Payer: BC Managed Care – PPO | Admitting: Surgical

## 2020-06-03 VITALS — BP 145/85 | HR 92

## 2020-06-03 DIAGNOSIS — M25512 Pain in left shoulder: Secondary | ICD-10-CM

## 2020-06-03 DIAGNOSIS — Z9889 Other specified postprocedural states: Secondary | ICD-10-CM

## 2020-06-03 DIAGNOSIS — N651 Disproportion of reconstructed breast: Secondary | ICD-10-CM

## 2020-06-03 DIAGNOSIS — M25612 Stiffness of left shoulder, not elsewhere classified: Secondary | ICD-10-CM

## 2020-06-03 NOTE — Progress Notes (Signed)
Patient is a 55 year old female here for follow-up after undergoing Lipo filling of her breasts with Dr. Marla Roe on 05/05/2020.  She is approximately 1 month postop.  She is doing really well.  She reports that she has noticed an improvement in the contour of her breasts.  She has some suture knots that she would like removed.  She is here with her daughter.  Chaperone present on exam On exam bilateral breast incisions are intact.  Abdominal incisions are intact.  No bruising is noted.  No subcutaneous fluid collections are noted.  No erythema is noted.  She has good contour of bilateral breasts with improved fullness of the superior pole.  No longer necessary to wear compressive garment on abdomen.  No longer necessary to wear compressive garment over breast 24/7.  She is scheduled for nipple areolar tattooing in the near future.  All of her questions were answered to her content.  There is no sign of infection, seroma, hematoma.  Pictures were previously taken.  Recommend following up in 6 months for reevaluation.  Recommend calling with questions or concerns.

## 2020-06-03 NOTE — Therapy (Signed)
[facility] [address] [place], [address] Phone: [phone]   Fax:  [phone]  Physical Therapy Treatment  Patient Details  Name: Tiffany Acevedo MRN: [id number] Date of Birth: [birth year] No data recorded  Encounter Date: 06/03/2020   PT End of Session - 06/03/20 1718    Visit Number 24    Number of Visits 45    Date for PT Re-Evaluation 07/18/20    Authorization - Visit Number 24    Authorization - Number of Visits 100    PT Start Time 0349    PT Stop Time 1730    PT Time Calculation (min) 45 min    Activity Tolerance Patient tolerated treatment well    Behavior During Therapy [facility] for tasks assessed/performed           Past Medical History:  Diagnosis Date  . Allergy   . Anxiety   . Breast cancer ([place])   . Depression   . Excessive weight gain 06/30/2016  . Family history of colon cancer   . Family history of colon cancer   . Family history of prostate cancer   . Frequent headaches   . GERD (gastroesophageal reflux disease)   . History of kidney stones   . Hypertension   . Migraines   . Pre-diabetes     Past Surgical History:  Procedure Laterality Date  . BREAST BIOPSY Right 07/13/2019   stereo biopsy/ x clip/path pending  . BREAST BIOPSY Right 07/13/2019   stereo biopsy/coil clip/ path pending  . BREAST RECONSTRUCTION WITH PLACEMENT OF TISSUE EXPANDER AND FLEX HD (ACELLULAR HYDRATED DERMIS) Bilateral 08/27/2019   Procedure: BILATERAL BREAST RECONSTRUCTION WITH PLACEMENT OF TISSUE EXPANDER AND FLEX HD (ACELLULAR HYDRATED DERMIS);  Surgeon: Wallace Going, DO;  Location: [facility];  Service: Plastics;  Laterality: Bilateral;  . [place], 2004, 2013  . COLONOSCOPY  2015  . CYSTOSCOPY W/ RETROGRADES Right 09/13/2018   Procedure: CYSTOSCOPY WITH RETROGRADE PYELOGRAM;  Surgeon: Abbie Sons, MD;  Location: [facility];  Service: Urology;  Laterality: Right;  .  CYSTOSCOPY/URETEROSCOPY/HOLMIUM LASER/STENT PLACEMENT Left 05/09/2018   Procedure: CYSTOSCOPY/URETEROSCOPY/HOLMIUM LASER/STENT PLACEMENT;  Surgeon: Abbie Sons, MD;  Location: [facility];  Service: Urology;  Laterality: Left;  . CYSTOSCOPY/URETEROSCOPY/HOLMIUM LASER/STENT PLACEMENT Right 09/13/2018   Procedure: CYSTOSCOPY/URETEROSCOPY/HOLMIUM LASER/STENT PLACEMENT;  Surgeon: Abbie Sons, MD;  Location: [facility];  Service: Urology;  Laterality: Right;  . INCONTINENCE SURGERY     BLADDER TUCK  . LIPOSUCTION WITH LIPOFILLING Bilateral 05/05/2020   Procedure: LIPOSUCTION WITH LIPOFILLING; LIPOSUCTION OF EXCESS LATERAL BREAST TISSUE;  Surgeon: Wallace Going, DO;  Location: [facility];  Service: Plastics;  Laterality: Bilateral;  90 min  . PILONIDAL CYST EXCISION    . REMOVAL OF BILATERAL TISSUE EXPANDERS WITH PLACEMENT OF BILATERAL BREAST IMPLANTS Bilateral 01/02/2020   Procedure: REMOVAL OF BILATERAL TISSUE EXPANDERS WITH PLACEMENT OF BILATERAL BREAST IMPLANTS;  Surgeon: Wallace Going, DO;  Location: [facility];  Service: Plastics;  Laterality: Bilateral;  2 hours, please  . STONE EXTRACTION WITH BASKET Right 09/13/2018   Procedure: STONE EXTRACTION WITH BASKET;  Surgeon: Abbie Sons, MD;  Location: [facility];  Service: Urology;  Laterality: Right;  . TOTAL MASTECTOMY Bilateral 08/27/2019   Procedure: TOTAL MASTECTOMY;  Surgeon: Herbert Pun, MD;  Location: [facility];  Service: General;  Laterality: Bilateral;  START _0  DUE TO SENTINEL NODE  . UPPER GI ENDOSCOPY  There were no vitals filed for this visit.   Subjective Assessment - 06/03/20 1652    Subjective Patient reports no pain, minimal compliance with HEP while on vacation. Reports popping with some overhead motions 50% of the time, but that it does not hurt.    Pertinent History Pt is a 55 year old female s/p L proximal humerus fracture 12/02/10. PMH double masectomy 08/27/19 with  expander placement and reconstruction finished 01/03/20.Pt fell in [place] walking straight forward onto her shoulder. Patient reports fracture was non-displaced and she did not have surgery, by Dr. Adrian Prince advisement, wore sling 6 weeks. Currently does not know orthopedic expectations for weight restrictions/movement resistrictions, but has not been lifting more than 1/2 gallon of milk, but is no longer wearing sling. 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 Patient reports motion is very restricted and she is unable to don/doff clothes, her husband washes her hair for her, unable to push/pull, or lift anything. Pt is an avid golfer, and walks often. Worst shoulder pain over the past week 8/10 best 0/10 but reports she avoids painful movement.  Pt is R hand dominant. Pt denies N/V, B&B changes, unexplained weight fluctuation, saddle paresthesia, fever, night sweats, or unrelenting night pain at this time.    Limitations Lifting;House hold activities;Writing    How long can you sit comfortably? unlimited    How long can you stand comfortably? unlimited    How long can you walk comfortably? unlimited    Diagnostic tests Xrays [facility] 12/03/19    Patient Stated Goals Full ROM to complete self care    Pain Onset More than a month ago              Ther-Ex TRX flex/abd walkouts x12 Total gym "pull ups" L12 x6 L13 x5 L14 x5 with good carry over of technique from previous sessions [place] 2x 10 20# with min cuing for full ext at eccentric with good carry over  Face pull 10# 2x 10 with cuing to maintain elbow height with good carry over Lateral raises short lever x10; with 1# DB x8 with min cuing for true lateral with good carry over Y on wall x12   Manual PROM all directions Success with contract relax (10sec contract 20sec relax) x2 all directions              PT Education - 06/03/20 1655    Education Details therex  form/technique, HEP review    Person(s) Educated Patient    Methods Explanation;Demonstration;Verbal cues    Comprehension Verbalized understanding;Returned demonstration;Verbal cues required            PT Short Term Goals - 05/21/20 1535      PT SHORT TERM GOAL #1   Title Pt will be independent with HEP in order to improve strength and decrease pain in order to improve pain-free function at home and work.    Baseline 01/23/20 HEP given; 05/21/20 completing prior to surgery, HEP updated today    Time 4    Period Weeks    Status Revised      PT SHORT TERM GOAL #2   Title Pt will demonstrate full PROM to demonstrate joint mobility needed for for full AROM    Baseline 05/21/20 flex: 143 abd: 105 ER: 54 IR: 72    Status New             PT Long Term Goals - 05/21/20 1531  PT LONG TERM GOAL #1   Title Pt will demonstrate full active L shoulder ROM in order to complete self care and overhead ADLS    Baseline 35/36/14 flex 39d abd 39d  ER L ear IR L PSIS; 05/22/10 flex 116d abd 81d  ER C7 IR T12    Time 8    Period Weeks    Status On-going      PT LONG TERM GOAL #2   Title Pt will decrease worst pain as reported on NPRS by at least 3 points in order to demonstrate clinically significant reduction in pain.    Baseline 01/25/20 8/10; 05/21/20    Time 8    Period Weeks    Status On-going      PT LONG TERM GOAL #3   Title Pt will demonstrate L gross shoulder strength of 5/5 in order to demonstrate PLOF and strength needed for heavy household tasks    Baseline 01/23/20 flex 2+ abd 2+ ER 2+ IR 2+; 05/21/20 In availible range: flex: 4+ abd: 4- ER: 4+ IR: 4+    Time 8    Period Weeks    Status On-going      PT LONG TERM GOAL #4   Title Patient will increase FOTO score to 79 to demonstrate predicted increase in functional mobility to complete ADLs                 Plan - 06/03/20 1746    Clinical Impression Statement PT continued therex progression for increased overhead  mobility, strength, and scapulohumeral rhythm with success. Patietn is able to demonstrate good carry over of all cuing for proper technique of therex. Patient demonstrates muscle gaurding, inhibiting normal mechanics of motion and passive ROM, which she is able to somewhat correct through cuing. Patient responds very well to contract relax method to obtain increased PROM, demonstrating that muscle tension is a huge limiting factor to motion. PT encouraged patient in making conscious effort to not gaurd her UE, use heat, stretching, and posture to help with this with good understanding. PT will continue progression as able.    Personal Factors and Comorbidities Comorbidity 1;Comorbidity 2;Comorbidity 3+;Past/Current Experience;Fitness;Time since onset of injury/illness/exacerbation    Comorbidities breast cancer, HTN, GERD, anxiety    Examination-Activity Limitations Bathing;Lift;Transfers;Reach Overhead;Carry;Dressing    Examination-Participation Restrictions Yard Work;Cleaning;Community Activity;Driving;Occupation;Laundry    Stability/Clinical Decision Making Evolving/Moderate complexity    Clinical Decision Making Moderate    Rehab Potential Good    PT Frequency 2x / week    PT Duration 8 weeks    PT Treatment/Interventions ADLs/Self Care Home Management;Electrical Stimulation;Therapeutic exercise;Balance training;Joint Manipulations;Taping;Spinal Manipulations;Cryotherapy;Iontophoresis 28m/ml Dexamethasone;DME Instruction;Neuromuscular re-education;Gait training;Stair training;Functional mobility training;Moist Heat;Traction;Ultrasound;Therapeutic activities;Patient/family education;Manual techniques;Dry needling;Passive range of motion;Vestibular    PT Next Visit Plan PROM/AAROM    PT Home Exercise Plan see 04/07/20 note    Consulted and Agree with Plan of Care Patient           Patient will benefit from skilled therapeutic intervention in order to improve the following deficits and  impairments:  Decreased balance,Impaired flexibility,Impaired UE functional use,Hypomobility,Decreased strength,Decreased range of motion,Decreased endurance,Decreased activity tolerance,Postural dysfunction,Increased muscle spasms,Hypermobility,Decreased mobility,Decreased coordination,Abnormal gait,Pain,Improper body mechanics,Increased fascial restricitons  Visit Diagnosis: Acute pain of left shoulder  Stiffness of left shoulder, not elsewhere classified     Problem List Patient Active Problem List   Diagnosis Date Noted  . Breast asymmetry following reconstructive surgery 03/25/2020  . S/P breast reconstruction, bilateral 03/06/2020  . Sebaceous cyst 12/31/2019  . Breast wound  10/05/2019  . Acquired absence of bilateral breasts and nipples 09/04/2019  . Genetic testing 08/10/2019  . Monoallelic mutation of CHEK2 gene in female patient 08/07/2019  . Malignant neoplasm of upper-outer quadrant of right breast in female, estrogen receptor positive ([place]) 07/30/2019  . Goals of care, counseling/discussion 07/30/2019  . Family history of prostate cancer   . Family history of colon cancer   . Breast cancer ([place]) 07/25/2019  . Left lower quadrant pain 05/23/2018  . Personal history of kidney stones 04/20/2018  . Microscopic hematuria 04/20/2018  . 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 Overweight (BMI 25.0-29.9) 01/22/2017  . Encounter for preventive health examination 06/30/2016  . Renal colic 02/71/4232  . Ureteral calculus 04/12/2015  . Nephrolithiasis 03/04/2015  . Intractable migraine with aura without status migrainosus 10/29/2013  . Lack of libido 06/24/2013  . Migraine syndrome 03/11/2013  . Generalized anxiety disorder 03/11/2013   Durwin Reges DPT Durwin Reges 06/03/2020, 5:51 PM  [facility] [address] [place], [address] Phone: [phone]   Fax:  [phone]  Name:  Tiffany Acevedo MRN: [id number] Date of Birth: [birth year]

## 2020-06-04 ENCOUNTER — Encounter: Payer: BC Managed Care – PPO | Admitting: Physical Therapy

## 2020-06-05 ENCOUNTER — Encounter: Payer: Self-pay | Admitting: Physical Therapy

## 2020-06-05 ENCOUNTER — Other Ambulatory Visit: Payer: Self-pay

## 2020-06-05 ENCOUNTER — Ambulatory Visit: Payer: BC Managed Care – PPO | Admitting: Physical Therapy

## 2020-06-05 DIAGNOSIS — M25612 Stiffness of left shoulder, not elsewhere classified: Secondary | ICD-10-CM

## 2020-06-05 DIAGNOSIS — M25512 Pain in left shoulder: Secondary | ICD-10-CM

## 2020-06-05 NOTE — Therapy (Signed)
[facility] [address] [place], [address] Phone: [phone]   Fax:  [phone]  Physical Therapy Treatment  Patient Details  Name: Tiffany Acevedo MRN: [id number] Date of Birth: [birth year] No data recorded  Encounter Date: 06/05/2020    Past Medical History:  Diagnosis Date  . Allergy   . Anxiety   . Breast cancer ([place])   . Depression   . Excessive weight gain 06/30/2016  . Family history of colon cancer   . Family history of colon cancer   . Family history of prostate cancer   . Frequent headaches   . GERD (gastroesophageal reflux disease)   . History of kidney stones   . Hypertension   . Migraines   . Pre-diabetes     Past Surgical History:  Procedure Laterality Date  . BREAST BIOPSY Right 07/13/2019   stereo biopsy/ x clip/path pending  . BREAST BIOPSY Right 07/13/2019   stereo biopsy/coil clip/ path pending  . BREAST RECONSTRUCTION WITH PLACEMENT OF TISSUE EXPANDER AND FLEX HD (ACELLULAR HYDRATED DERMIS) Bilateral 08/27/2019   Procedure: BILATERAL BREAST RECONSTRUCTION WITH PLACEMENT OF TISSUE EXPANDER AND FLEX HD (ACELLULAR HYDRATED DERMIS);  Surgeon: Wallace Going, DO;  Location: [facility];  Service: Plastics;  Laterality: Bilateral;  . [place], 2004, 2013  . COLONOSCOPY  2015  . CYSTOSCOPY W/ RETROGRADES Right 09/13/2018   Procedure: CYSTOSCOPY WITH RETROGRADE PYELOGRAM;  Surgeon: Abbie Sons, MD;  Location: [facility];  Service: Urology;  Laterality: Right;  . CYSTOSCOPY/URETEROSCOPY/HOLMIUM LASER/STENT PLACEMENT Left 05/09/2018   Procedure: CYSTOSCOPY/URETEROSCOPY/HOLMIUM LASER/STENT PLACEMENT;  Surgeon: Abbie Sons, MD;  Location: [facility];  Service: Urology;  Laterality: Left;  . CYSTOSCOPY/URETEROSCOPY/HOLMIUM LASER/STENT PLACEMENT Right 09/13/2018   Procedure: CYSTOSCOPY/URETEROSCOPY/HOLMIUM LASER/STENT PLACEMENT;  Surgeon: Abbie Sons, MD;   Location: [facility];  Service: Urology;  Laterality: Right;  . INCONTINENCE SURGERY     BLADDER TUCK  . LIPOSUCTION WITH LIPOFILLING Bilateral 05/05/2020   Procedure: LIPOSUCTION WITH LIPOFILLING; LIPOSUCTION OF EXCESS LATERAL BREAST TISSUE;  Surgeon: Wallace Going, DO;  Location: [facility];  Service: Plastics;  Laterality: Bilateral;  90 min  . PILONIDAL CYST EXCISION    . REMOVAL OF BILATERAL TISSUE EXPANDERS WITH PLACEMENT OF BILATERAL BREAST IMPLANTS Bilateral 01/02/2020   Procedure: REMOVAL OF BILATERAL TISSUE EXPANDERS WITH PLACEMENT OF BILATERAL BREAST IMPLANTS;  Surgeon: Wallace Going, DO;  Location: [facility];  Service: Plastics;  Laterality: Bilateral;  2 hours, please  . STONE EXTRACTION WITH BASKET Right 09/13/2018   Procedure: STONE EXTRACTION WITH BASKET;  Surgeon: Abbie Sons, MD;  Location: [facility];  Service: Urology;  Laterality: Right;  . TOTAL MASTECTOMY Bilateral 08/27/2019   Procedure: TOTAL MASTECTOMY;  Surgeon: Herbert Pun, MD;  Location: [facility];  Service: General;  Laterality: Bilateral;  START @930  DUE TO SENTINEL NODE  . UPPER GI ENDOSCOPY      There were no vitals filed for this visit.   Subjective Assessment - 06/05/20 1656    Subjective Reports no pain but muscle soreness from last session. Minimal compliance with HEP.    Pertinent History Pt is a 55 year old female s/p L proximal humerus fracture 12/02/10. PMH double masectomy 08/27/19 with expander placement and reconstruction finished 01/03/20.Pt fell in [place] walking straight forward onto her shoulder. Patient reports fracture was non-displaced and she did not have surgery, by Dr. Adrian Prince advisement, wore sling 6 weeks. Currently does not know  orthopedic expectations for weight restrictions/movement resistrictions, but has not been lifting more than 1/2 gallon of milk, but is no longer wearing sling. 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 Patient reports motion is very restricted and she is unable to don/doff clothes, her husband washes her hair for her, unable to push/pull, or lift anything. Pt is an avid golfer, and walks often. Worst shoulder pain over the past week 8/10 best 0/10 but reports she avoids painful movement.  Pt is R hand dominant. Pt denies N/V, B&B changes, unexplained weight fluctuation, saddle paresthesia, fever, night sweats, or unrelenting night pain at this time.    Limitations Lifting;House hold activities;Writing    How long can you sit comfortably? unlimited    How long can you stand comfortably? unlimited    How long can you walk comfortably? unlimited    Diagnostic tests Xrays [facility] 12/03/19    Patient Stated Goals Full ROM to complete self care    Pain Onset More than a month ago              Ther-Ex TRX flex/abd walkouts x12 Total gym "pull ups" L15 x5 L14 2x 5 with good carry over of technique from previous sessions ER at 90/90 with back against wall 2x 12 with minimal ROM, good effort Standing bilat RTB 2x 10 with min cuing for eccentric control with good carry over Lateral raises short lever with 1# DB 2x 10 with min cuing to prevent shoulder hiking with decent carry over   Manual PROM all directions Success with contract relax (10sec contract 20sec relax) x2 all directions            PT Short Term Goals - 05/21/20 1535      PT SHORT TERM GOAL #1   Title Pt will be independent with HEP in order to improve strength and decrease pain in order to improve pain-free function at home and work.    Baseline 01/23/20 HEP given; 05/21/20 completing prior to surgery, HEP updated today    Time 4    Period Weeks    Status Revised      PT SHORT TERM GOAL #2   Title Pt will demonstrate full PROM to demonstrate joint mobility needed for for full AROM    Baseline 05/21/20 flex: 143 abd: 105 ER: 54 IR: 72    Status New             PT  Long Term Goals - 05/21/20 1531      PT LONG TERM GOAL #1   Title Pt will demonstrate full active L shoulder ROM in order to complete self care and overhead ADLS    Baseline 40/98/11 flex 39d abd 39d  ER L ear IR L PSIS; 05/22/10 flex 116d abd 81d  ER C7 IR T12    Time 8    Period Weeks    Status On-going      PT LONG TERM GOAL #2   Title Pt will decrease worst pain as reported on NPRS by at least 3 points in order to demonstrate clinically significant reduction in pain.    Baseline 01/25/20 8/10; 05/21/20    Time 8    Period Weeks    Status On-going      PT LONG TERM GOAL #3   Title Pt will demonstrate L gross shoulder strength of 5/5 in order to demonstrate PLOF and strength needed for heavy household tasks  Baseline 01/23/20 flex 2+ abd 2+ ER 2+ IR 2+; 05/21/20 In availible range: flex: 4+ abd: 4- ER: 4+ IR: 4+    Time 8    Period Weeks    Status On-going      PT LONG TERM GOAL #4   Title Patient will increase FOTO score to 79 to demonstrate predicted increase in functional mobility to complete ADLs                  Patient will benefit from skilled therapeutic intervention in order to improve the following deficits and impairments:     Visit Diagnosis: No diagnosis found.     Problem List Patient Active Problem List   Diagnosis Date Noted  . Breast asymmetry following reconstructive surgery 03/25/2020  . S/P breast reconstruction, bilateral 03/06/2020  . Sebaceous cyst 12/31/2019  . Breast wound 10/05/2019  . Acquired absence of bilateral breasts and nipples 09/04/2019  . Genetic testing 08/10/2019  . Monoallelic mutation of CHEK2 gene in female patient 08/07/2019  . Malignant neoplasm of upper-outer quadrant of right breast in female, estrogen receptor positive ([place]) 07/30/2019  . Goals of care, counseling/discussion 07/30/2019  . Family history of prostate cancer   . Family history of colon cancer   . Breast cancer ([place]) 07/25/2019  . Left lower  quadrant pain 05/23/2018  . Personal history of kidney stones 04/20/2018  . Microscopic hematuria 04/20/2018  . 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 Overweight (BMI 25.0-29.9) 01/22/2017  . Encounter for preventive health examination 06/30/2016  . Renal colic 22/56/7209  . Ureteral calculus 04/12/2015  . Nephrolithiasis 03/04/2015  . Intractable migraine with aura without status migrainosus 10/29/2013  . Lack of libido 06/24/2013  . Migraine syndrome 03/11/2013  . Generalized anxiety disorder 03/11/2013   Durwin Reges DPT Durwin Reges 06/05/2020, 4:58 PM  [facility] [address], [place], [address] Phone: [phone]   Fax:  [phone]  Name: Tiffany Acevedo MRN: [id number] Date of Birth: 10-30-1965

## 2020-06-06 ENCOUNTER — Encounter: Payer: BC Managed Care – PPO | Admitting: Physical Therapy

## 2020-06-10 ENCOUNTER — Ambulatory Visit: Payer: BC Managed Care – PPO | Admitting: Physical Therapy

## 2020-06-12 ENCOUNTER — Ambulatory Visit: Payer: BC Managed Care – PPO | Attending: Oncology | Admitting: Physical Therapy

## 2020-06-12 ENCOUNTER — Other Ambulatory Visit: Payer: Self-pay

## 2020-06-12 ENCOUNTER — Encounter: Payer: Self-pay | Admitting: Physical Therapy

## 2020-06-12 DIAGNOSIS — M25512 Pain in left shoulder: Secondary | ICD-10-CM | POA: Diagnosis not present

## 2020-06-12 DIAGNOSIS — M25612 Stiffness of left shoulder, not elsewhere classified: Secondary | ICD-10-CM | POA: Diagnosis present

## 2020-06-12 NOTE — Therapy (Signed)
[place] [facility] [address], [place], [address] Phone: [phone]   Fax:  [phone]  Physical Therapy Treatment  Patient Details  Name: Tiffany Acevedo MRN: [id number] Date of Birth: 26-Nov-1965 No data recorded  Encounter Date: 06/12/2020   PT End of Session - 06/12/20 1613    Visit Number 26    Number of Visits 45    Date for PT Re-Evaluation 07/18/20    Authorization - Visit Number 82    Authorization - Number of Visits 100    PT Start Time 9935    PT Stop Time 1646    PT Time Calculation (min) 38 min    Activity Tolerance Patient tolerated treatment well    Behavior During Therapy [facility] for tasks assessed/performed           Past Medical History:  Diagnosis Date  . Allergy   . Anxiety   . Breast cancer ([place])   . Depression   . Excessive weight gain 06/30/2016  . Family history of colon cancer   . Family history of colon cancer   . Family history of prostate cancer   . Frequent headaches   . GERD (gastroesophageal reflux disease)   . History of kidney stones   . Hypertension   . Migraines   . Pre-diabetes     Past Surgical History:  Procedure Laterality Date  . BREAST BIOPSY Right 07/13/2019   stereo biopsy/ x clip/path pending  . BREAST BIOPSY Right 07/13/2019   stereo biopsy/coil clip/ path pending  . BREAST RECONSTRUCTION WITH PLACEMENT OF TISSUE EXPANDER AND FLEX HD (ACELLULAR HYDRATED DERMIS) Bilateral 08/27/2019   Procedure: BILATERAL BREAST RECONSTRUCTION WITH PLACEMENT OF TISSUE EXPANDER AND FLEX HD (ACELLULAR HYDRATED DERMIS);  Surgeon: Wallace Going, DO;  Location: [facility];  Service: Plastics;  Laterality: Bilateral;  . [place], 2004, 2013  . COLONOSCOPY  2015  . CYSTOSCOPY W/ RETROGRADES Right 09/13/2018   Procedure: CYSTOSCOPY WITH RETROGRADE PYELOGRAM;  Surgeon: Abbie Sons, MD;  Location: [facility];  Service: Urology;  Laterality: Right;  .  CYSTOSCOPY/URETEROSCOPY/HOLMIUM LASER/STENT PLACEMENT Left 05/09/2018   Procedure: CYSTOSCOPY/URETEROSCOPY/HOLMIUM LASER/STENT PLACEMENT;  Surgeon: Abbie Sons, MD;  Location: [facility];  Service: Urology;  Laterality: Left;  . CYSTOSCOPY/URETEROSCOPY/HOLMIUM LASER/STENT PLACEMENT Right 09/13/2018   Procedure: CYSTOSCOPY/URETEROSCOPY/HOLMIUM LASER/STENT PLACEMENT;  Surgeon: Abbie Sons, MD;  Location: [facility];  Service: Urology;  Laterality: Right;  . INCONTINENCE SURGERY     BLADDER TUCK  . LIPOSUCTION WITH LIPOFILLING Bilateral 05/05/2020   Procedure: LIPOSUCTION WITH LIPOFILLING; LIPOSUCTION OF EXCESS LATERAL BREAST TISSUE;  Surgeon: Wallace Going, DO;  Location: [place];  Service: Plastics;  Laterality: Bilateral;  90 min  . PILONIDAL CYST EXCISION    . REMOVAL OF BILATERAL TISSUE EXPANDERS WITH PLACEMENT OF BILATERAL BREAST IMPLANTS Bilateral 01/02/2020   Procedure: REMOVAL OF BILATERAL TISSUE EXPANDERS WITH PLACEMENT OF BILATERAL BREAST IMPLANTS;  Surgeon: Wallace Going, DO;  Location: [place];  Service: Plastics;  Laterality: Bilateral;  2 hours, please  . STONE EXTRACTION WITH BASKET Right 09/13/2018   Procedure: STONE EXTRACTION WITH BASKET;  Surgeon: Abbie Sons, MD;  Location: [facility];  Service: Urology;  Laterality: Right;  . TOTAL MASTECTOMY Bilateral 08/27/2019   Procedure: TOTAL MASTECTOMY;  Surgeon: Herbert Pun, MD;  Location: [facility];  Service: General;  Laterality: Bilateral;  START @930  DUE TO SENTINEL NODE  . UPPER GI ENDOSCOPY  There were no vitals filed for this visit.   Subjective Assessment - 06/12/20 1610    Subjective patient reports pain in her shoulder today for the past couple days. She reports she feels that it needs to pop but has not popped.    Pertinent History Pt is a 55 year old female s/p L proximal humerus fracture 12/02/10. PMH double masectomy 08/27/19 with expander placement and  reconstruction finished 01/03/20.Pt fell in [place] walking straight forward onto her shoulder. Patient reports fracture was non-displaced and she did not have surgery, by Dr. Adrian Prince advisement, wore sling 6 weeks. Currently does not know orthopedic expectations for weight restrictions/movement resistrictions, but has not been lifting more than 1/2 gallon of milk, but is no longer wearing sling. 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 Patient reports motion is very restricted and she is unable to don/doff clothes, her husband washes her hair for her, unable to push/pull, or lift anything. Pt is an avid golfer, and walks often. Worst shoulder pain over the past week 8/10 best 0/10 but reports she avoids painful movement.  Pt is R hand dominant. Pt denies N/V, B&B changes, unexplained weight fluctuation, saddle paresthesia, fever, night sweats, or unrelenting night pain at this time.    Limitations Lifting;House hold activities;Writing           Ther-Ex TRX flex/abd walkouts x12 Total gym "pull ups" L15 x6, L15 2x 5with good carry over of technique from previous sessions, cueing for deep breaths throughout exercises   ER at 90/90 with back against wall 2x 12 with minimal ROM, good effort, tactile support to increase ROM and have patient control motion on the way down  Lateral raises short lever with 2# DB 3 x 8 with min cuing to prevent shoulder hiking with decent carry over,    Manual PROM all directions Success with contract relax (10sec contract 20sec relax) x2 all directions          PT Education - 06/12/20 1651    Education Details therex form/technique    Person(s) Educated Patient    Methods Explanation;Demonstration;Verbal cues    Comprehension Verbalized understanding;Returned demonstration;Verbal cues required            PT Short Term Goals - 05/21/20 1535      PT SHORT TERM GOAL #1   Title Pt will be  independent with HEP in order to improve strength and decrease pain in order to improve pain-free function at home and work.    Baseline 01/23/20 HEP given; 05/21/20 completing prior to surgery, HEP updated today    Time 4    Period Weeks    Status Revised      PT SHORT TERM GOAL #2   Title Pt will demonstrate full PROM to demonstrate joint mobility needed for for full AROM    Baseline 05/21/20 flex: 143 abd: 105 ER: 54 IR: 72    Status New             PT Long Term Goals - 05/21/20 1531      PT LONG TERM GOAL #1   Title Pt will demonstrate full active L shoulder ROM in order to complete self care and overhead ADLS    Baseline 89/37/34 flex 39d abd 39d  ER L ear IR L PSIS; 05/22/10 flex 116d abd 81d  ER C7 IR T12    Time 8    Period Weeks    Status On-going  PT LONG TERM GOAL #2   Title Pt will decrease worst pain as reported on NPRS by at least 3 points in order to demonstrate clinically significant reduction in pain.    Baseline 01/25/20 8/10; 05/21/20    Time 8    Period Weeks    Status On-going      PT LONG TERM GOAL #3   Title Pt will demonstrate L gross shoulder strength of 5/5 in order to demonstrate PLOF and strength needed for heavy household tasks    Baseline 01/23/20 flex 2+ abd 2+ ER 2+ IR 2+; 05/21/20 In availible range: flex: 4+ abd: 4- ER: 4+ IR: 4+    Time 8    Period Weeks    Status On-going      PT LONG TERM GOAL #4   Title Patient will increase FOTO score to 79 to demonstrate predicted increase in functional mobility to complete ADLs                 Plan - 06/12/20 1647    Clinical Impression Statement PT continued therex progression for increased overhead mobility and strength with success. Patient maintains motivation throughout session and responds to all cues to correct therex form. Patient continues to demonstrate increased ROM and strength. Patient reports slightly increased symptoms with exercises but toelrates them with success. PT wil  continue progression as able.    Personal Factors and Comorbidities Comorbidity 1;Comorbidity 2;Comorbidity 3+;Past/Current Experience;Fitness;Time since onset of injury/illness/exacerbation    Comorbidities breast cancer, HTN, GERD, anxiety    Examination-Activity Limitations Bathing;Lift;Transfers;Reach Overhead;Carry;Dressing    Examination-Participation Restrictions Yard Work;Cleaning;Community Activity;Driving;Occupation;Laundry    Stability/Clinical Decision Making Evolving/Moderate complexity    Clinical Decision Making Moderate    Rehab Potential Good    PT Frequency 2x / week    PT Duration 8 weeks    PT Treatment/Interventions ADLs/Self Care Home Management;Electrical Stimulation;Therapeutic exercise;Balance training;Joint Manipulations;Taping;Spinal Manipulations;Cryotherapy;Iontophoresis 62m/ml Dexamethasone;DME Instruction;Neuromuscular re-education;Gait training;Stair training;Functional mobility training;Moist Heat;Traction;Ultrasound;Therapeutic activities;Patient/family education;Manual techniques;Dry needling;Passive range of motion;Vestibular    PT Next Visit Plan PROM/AAROM    PT Home Exercise Plan see 04/07/20 note    Consulted and Agree with Plan of Care Patient           Patient will benefit from skilled therapeutic intervention in order to improve the following deficits and impairments:  Decreased balance,Impaired flexibility,Impaired UE functional use,Hypomobility,Decreased strength,Decreased range of motion,Decreased endurance,Decreased activity tolerance,Postural dysfunction,Increased muscle spasms,Hypermobility,Decreased mobility,Decreased coordination,Abnormal gait,Pain,Improper body mechanics,Increased fascial restricitons  Visit Diagnosis: Acute pain of left shoulder  Stiffness of left shoulder, not elsewhere classified     Problem List Patient Active Problem List   Diagnosis Date Noted  . Breast asymmetry following reconstructive surgery 03/25/2020  .  S/P breast reconstruction, bilateral 03/06/2020  . Sebaceous cyst 12/31/2019  . Breast wound 10/05/2019  . Acquired absence of bilateral breasts and nipples 09/04/2019  . Genetic testing 08/10/2019  . Monoallelic mutation of CHEK2 gene in female patient 08/07/2019  . Malignant neoplasm of upper-outer quadrant of right breast in female, estrogen receptor positive ([place] 07/30/2019  . Goals of care, counseling/discussion 07/30/2019  . Family history of prostate cancer   . Family history of colon cancer   . Breast cancer ([place] 07/25/2019  . Left lower quadrant pain 05/23/2018  . Personal history of kidney stones 04/20/2018  . Microscopic hematuria 04/20/2018  . 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 Overweight (BMI 25.0-29.9) 01/22/2017  . Encounter for preventive health examination 06/30/2016  . Renal colic 044/04/4740 .  Ureteral calculus 04/12/2015  . Nephrolithiasis 03/04/2015  . Intractable migraine with aura without status migrainosus 10/29/2013  . Lack of libido 06/24/2013  . Migraine syndrome 03/11/2013  . Generalized anxiety disorder 03/11/2013   Durwin Reges DPT Durwin Reges 06/12/2020, 6:20 PM  [place] [facility] [address] [place], [address] Phone: [phone]   Fax:  [phone]  Name: Berta Denson MRN: [id number] Date of Birth: 1965/10/31

## 2020-06-17 ENCOUNTER — Ambulatory Visit: Payer: BC Managed Care – PPO | Admitting: Physical Therapy

## 2020-06-19 ENCOUNTER — Other Ambulatory Visit: Payer: Self-pay

## 2020-06-19 ENCOUNTER — Encounter: Payer: Self-pay | Admitting: Physical Therapy

## 2020-06-19 ENCOUNTER — Ambulatory Visit: Payer: BC Managed Care – PPO | Admitting: Physical Therapy

## 2020-06-19 DIAGNOSIS — M25512 Pain in left shoulder: Secondary | ICD-10-CM

## 2020-06-19 DIAGNOSIS — M25612 Stiffness of left shoulder, not elsewhere classified: Secondary | ICD-10-CM

## 2020-06-19 NOTE — Therapy (Signed)
[place] [facility] [address] [place], [address] Phone: [phone]   Fax:  [phone]  Physical Therapy Treatment  Patient Details  Name: Tiffany Acevedo MRN: [id number] Date of Birth: April 07, 1965 No data recorded  Encounter Date: 06/19/2020   PT End of Session - 06/19/20 1714    Visit Number 27    Number of Visits 45    Date for PT Re-Evaluation 07/18/20    Authorization Time Period no limit    Authorization - Visit Number 22    Authorization - Number of Visits 100    PT Start Time 3151    PT Stop Time 1730    PT Time Calculation (min) 40 min    Activity Tolerance Patient tolerated treatment well    Behavior During Therapy [facility] for tasks assessed/performed           Past Medical History:  Diagnosis Date  . Allergy   . Anxiety   . Breast cancer ([place])   . Depression   . Excessive weight gain 06/30/2016  . Family history of colon cancer   . Family history of colon cancer   . Family history of prostate cancer   . Frequent headaches   . GERD (gastroesophageal reflux disease)   . History of kidney stones   . Hypertension   . Migraines   . Pre-diabetes     Past Surgical History:  Procedure Laterality Date  . BREAST BIOPSY Right 07/13/2019   stereo biopsy/ x clip/path pending  . BREAST BIOPSY Right 07/13/2019   stereo biopsy/coil clip/ path pending  . BREAST RECONSTRUCTION WITH PLACEMENT OF TISSUE EXPANDER AND FLEX HD (ACELLULAR HYDRATED DERMIS) Bilateral 08/27/2019   Procedure: BILATERAL BREAST RECONSTRUCTION WITH PLACEMENT OF TISSUE EXPANDER AND FLEX HD (ACELLULAR HYDRATED DERMIS);  Surgeon: Wallace Going, DO;  Location: [facility];  Service: Plastics;  Laterality: Bilateral;  . [place], 2004, 2013  . COLONOSCOPY  2015  . CYSTOSCOPY W/ RETROGRADES Right 09/13/2018   Procedure: CYSTOSCOPY WITH RETROGRADE PYELOGRAM;  Surgeon: Abbie Sons, MD;  Location: [facility];   Service: Urology;  Laterality: Right;  . CYSTOSCOPY/URETEROSCOPY/HOLMIUM LASER/STENT PLACEMENT Left 05/09/2018   Procedure: CYSTOSCOPY/URETEROSCOPY/HOLMIUM LASER/STENT PLACEMENT;  Surgeon: Abbie Sons, MD;  Location: [facility];  Service: Urology;  Laterality: Left;  . CYSTOSCOPY/URETEROSCOPY/HOLMIUM LASER/STENT PLACEMENT Right 09/13/2018   Procedure: CYSTOSCOPY/URETEROSCOPY/HOLMIUM LASER/STENT PLACEMENT;  Surgeon: Abbie Sons, MD;  Location: [facility];  Service: Urology;  Laterality: Right;  . INCONTINENCE SURGERY     BLADDER TUCK  . LIPOSUCTION WITH LIPOFILLING Bilateral 05/05/2020   Procedure: LIPOSUCTION WITH LIPOFILLING; LIPOSUCTION OF EXCESS LATERAL BREAST TISSUE;  Surgeon: Wallace Going, DO;  Location: [place];  Service: Plastics;  Laterality: Bilateral;  90 min  . PILONIDAL CYST EXCISION    . REMOVAL OF BILATERAL TISSUE EXPANDERS WITH PLACEMENT OF BILATERAL BREAST IMPLANTS Bilateral 01/02/2020   Procedure: REMOVAL OF BILATERAL TISSUE EXPANDERS WITH PLACEMENT OF BILATERAL BREAST IMPLANTS;  Surgeon: Wallace Going, DO;  Location: [place];  Service: Plastics;  Laterality: Bilateral;  2 hours, please  . STONE EXTRACTION WITH BASKET Right 09/13/2018   Procedure: STONE EXTRACTION WITH BASKET;  Surgeon: Abbie Sons, MD;  Location: [facility];  Service: Urology;  Laterality: Right;  . TOTAL MASTECTOMY Bilateral 08/27/2019   Procedure: TOTAL MASTECTOMY;  Surgeon: Herbert Pun, MD;  Location: [facility];  Service: General;  Laterality: Bilateral;  START _0  DUE TO SENTINEL  NODE  . UPPER GI ENDOSCOPY      There were no vitals filed for this visit.   Subjective Assessment - 06/19/20 1657    Subjective Patient reports no pain today. She is completing HEP, is discouraged my slowness of her progress. Still having some popping in her shoulder randomly.    Pertinent History Pt is a 55 year old female s/p L proximal humerus fracture 12/02/10.  PMH double masectomy 08/27/19 with expander placement and reconstruction finished 01/03/20.Pt fell in [place] walking straight forward onto her shoulder. Patient reports fracture was non-displaced and she did not have surgery, by Dr. Adrian Prince advisement, wore sling 6 weeks. Currently does not know orthopedic expectations for weight restrictions/movement resistrictions, but has not been lifting more than 1/2 gallon of milk, but is no longer wearing sling. 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 Patient reports motion is very restricted and she is unable to don/doff clothes, her husband washes her hair for her, unable to push/pull, or lift anything. Pt is an avid golfer, and walks often. Worst shoulder pain over the past week 8/10 best 0/10 but reports she avoids painful movement.  Pt is R hand dominant. Pt denies N/V, B&B changes, unexplained weight fluctuation, saddle paresthesia, fever, night sweats, or unrelenting night pain at this time.    Limitations Lifting;House hold activities;Writing    How long can you sit comfortably? unlimited    How long can you stand comfortably? unlimited    How long can you walk comfortably? unlimited    Diagnostic tests Xrays [facility] 12/03/19    Patient Stated Goals Full ROM to complete self care    Pain Onset More than a month ago              Ther-Ex TRX flex/abd walkouts x12 Total gym "pull ups" L15 3x 6with good carry over of technique from previous sessions, cueing for deep breaths throughout exercises   Supine row 5# 3x 10 with min cuing for scapular retraction with good carry over  Supine flexion <> ext 5# with min cuing for eccentric control with good carry over   Manual PROM all directions Success with contract relax (10sec contract 20sec relax) x2 all directions                        PT Education - 06/19/20 1707    Education Details therex form/technique     Person(s) Educated Patient    Methods Explanation;Demonstration;Verbal cues    Comprehension Verbalized understanding;Verbal cues required;Returned demonstration            PT Short Term Goals - 05/21/20 1535      PT SHORT TERM GOAL #1   Title Pt will be independent with HEP in order to improve strength and decrease pain in order to improve pain-free function at home and work.    Baseline 01/23/20 HEP given; 05/21/20 completing prior to surgery, HEP updated today    Time 4    Period Weeks    Status Revised      PT SHORT TERM GOAL #2   Title Pt will demonstrate full PROM to demonstrate joint mobility needed for for full AROM    Baseline 05/21/20 flex: 143 abd: 105 ER: 54 IR: 72    Status New             PT Long Term Goals - 05/21/20 1531      PT LONG  TERM GOAL #1   Title Pt will demonstrate full active L shoulder ROM in order to complete self care and overhead ADLS    Baseline 66/06/00 flex 39d abd 39d  ER L ear IR L PSIS; 05/22/10 flex 116d abd 81d  ER C7 IR T12    Time 8    Period Weeks    Status On-going      PT LONG TERM GOAL #2   Title Pt will decrease worst pain as reported on NPRS by at least 3 points in order to demonstrate clinically significant reduction in pain.    Baseline 01/25/20 8/10; 05/21/20    Time 8    Period Weeks    Status On-going      PT LONG TERM GOAL #3   Title Pt will demonstrate L gross shoulder strength of 5/5 in order to demonstrate PLOF and strength needed for heavy household tasks    Baseline 01/23/20 flex 2+ abd 2+ ER 2+ IR 2+; 05/21/20 In availible range: flex: 4+ abd: 4- ER: 4+ IR: 4+    Time 8    Period Weeks    Status On-going      PT LONG TERM GOAL #4   Title Patient will increase FOTO score to 79 to demonstrate predicted increase in functional mobility to complete ADLs                 Plan - 06/19/20 1803    Clinical Impression Statement PT continued therex progression for increased overhead mobility and scapulohumeral  rhythm with success. patient demonstrates good motion with active assisted therex with good motivation throughout session. Patient is able to comply with all cuing for proper technique of therex with minimal pain throughout session, allieviated with rest. PT will continue progression as able.    Personal Factors and Comorbidities Comorbidity 1;Comorbidity 2;Comorbidity 3+;Past/Current Experience;Fitness;Time since onset of injury/illness/exacerbation    Comorbidities breast cancer, HTN, GERD, anxiety    Examination-Activity Limitations Bathing;Lift;Transfers;Reach Overhead;Carry;Dressing    Examination-Participation Restrictions Yard Work;Cleaning;Community Activity;Driving;Occupation;Laundry    Stability/Clinical Decision Making Evolving/Moderate complexity    Clinical Decision Making Moderate    Rehab Potential Good    PT Frequency 2x / week    PT Duration 8 weeks    PT Treatment/Interventions ADLs/Self Care Home Management;Electrical Stimulation;Therapeutic exercise;Balance training;Joint Manipulations;Taping;Spinal Manipulations;Cryotherapy;Iontophoresis 56m/ml Dexamethasone;DME Instruction;Neuromuscular re-education;Gait training;Stair training;Functional mobility training;Moist Heat;Traction;Ultrasound;Therapeutic activities;Patient/family education;Manual techniques;Dry needling;Passive range of motion;Vestibular    PT Next Visit Plan PROM/AAROM    PT Home Exercise Plan see 04/07/20 note    Consulted and Agree with Plan of Care Patient           Patient will benefit from skilled therapeutic intervention in order to improve the following deficits and impairments:  Decreased balance,Impaired flexibility,Impaired UE functional use,Hypomobility,Decreased strength,Decreased range of motion,Decreased endurance,Decreased activity tolerance,Postural dysfunction,Increased muscle spasms,Hypermobility,Decreased mobility,Decreased coordination,Abnormal gait,Pain,Improper body mechanics,Increased fascial  restricitons  Visit Diagnosis: Acute pain of left shoulder  Stiffness of left shoulder, not elsewhere classified     Problem List Patient Active Problem List   Diagnosis Date Noted  . Breast asymmetry following reconstructive surgery 03/25/2020  . S/P breast reconstruction, bilateral 03/06/2020  . Sebaceous cyst 12/31/2019  . Breast wound 10/05/2019  . Acquired absence of bilateral breasts and nipples 09/04/2019  . Genetic testing 08/10/2019  . Monoallelic mutation of CHEK2 gene in female patient 08/07/2019  . Malignant neoplasm of upper-outer quadrant of right breast in female, estrogen receptor positive ([place] 07/30/2019  . Goals of care, counseling/discussion 07/30/2019  .  Family history of prostate cancer   . Family history of colon cancer   . Breast cancer ([place]) 07/25/2019  . Left lower quadrant pain 05/23/2018  . Personal history of kidney stones 04/20/2018  . Microscopic hematuria 04/20/2018  . 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 Overweight (BMI 25.0-29.9) 01/22/2017  . Encounter for preventive health examination 06/30/2016  . Renal colic 74/60/0298  . Ureteral calculus 04/12/2015  . Nephrolithiasis 03/04/2015  . Intractable migraine with aura without status migrainosus 10/29/2013  . Lack of libido 06/24/2013  . Migraine syndrome 03/11/2013  . Generalized anxiety disorder 03/11/2013   Durwin Reges DPT Durwin Reges 06/19/2020, 6:07 PM  [place] [facility] [address], [place], [address] Phone: [phone]   Fax:  [phone]  Name: Tiffany Acevedo MRN: [id number] Date of Birth: 09-01-1965

## 2020-06-24 ENCOUNTER — Ambulatory Visit: Payer: BC Managed Care – PPO | Admitting: Physical Therapy

## 2020-06-26 ENCOUNTER — Ambulatory Visit: Payer: BC Managed Care – PPO | Admitting: Physical Therapy

## 2020-07-01 ENCOUNTER — Ambulatory Visit: Payer: BC Managed Care – PPO | Admitting: Physical Therapy

## 2020-07-03 ENCOUNTER — Ambulatory Visit: Payer: BC Managed Care – PPO | Admitting: Physical Therapy

## 2020-07-09 ENCOUNTER — Ambulatory Visit: Payer: BC Managed Care – PPO | Admitting: Physical Therapy

## 2020-07-10 ENCOUNTER — Ambulatory Visit: Payer: BC Managed Care – PPO | Attending: Oncology | Admitting: Physical Therapy

## 2020-07-10 ENCOUNTER — Other Ambulatory Visit: Payer: Self-pay

## 2020-07-10 ENCOUNTER — Encounter: Payer: Self-pay | Admitting: Physical Therapy

## 2020-07-10 DIAGNOSIS — M25512 Pain in left shoulder: Secondary | ICD-10-CM | POA: Diagnosis not present

## 2020-07-10 DIAGNOSIS — Z9013 Acquired absence of bilateral breasts and nipples: Secondary | ICD-10-CM | POA: Diagnosis present

## 2020-07-10 DIAGNOSIS — M25612 Stiffness of left shoulder, not elsewhere classified: Secondary | ICD-10-CM

## 2020-07-10 NOTE — Therapy (Signed)
[facility] [address] [place], [address] Phone: [phone]   Fax:  [phone]  Physical Therapy Treatment  Patient Details  Name: Tiffany Acevedo MRN: [id number] Date of Birth: 06-15-1965 No data recorded  Encounter Date: 07/10/2020   PT End of Session - 07/10/20 1312    Visit Number 28    Number of Visits 45    Date for PT Re-Evaluation 07/18/20    Authorization Time Period no limit    Authorization - Visit Number 28    Authorization - Number of Visits 100    PT Start Time 2707    PT Stop Time 1346    PT Time Calculation (min) 38 min    Activity Tolerance Patient tolerated treatment well    Behavior During Therapy [facility] for tasks assessed/performed           Past Medical History:  Diagnosis Date  . Allergy   . Anxiety   . Breast cancer ([place])   . Depression   . Excessive weight gain 06/30/2016  . Family history of colon cancer   . Family history of colon cancer   . Family history of prostate cancer   . Frequent headaches   . GERD (gastroesophageal reflux disease)   . History of kidney stones   . Hypertension   . Migraines   . Pre-diabetes     Past Surgical History:  Procedure Laterality Date  . BREAST BIOPSY Right 07/13/2019   stereo biopsy/ x clip/path pending  . BREAST BIOPSY Right 07/13/2019   stereo biopsy/coil clip/ path pending  . BREAST RECONSTRUCTION WITH PLACEMENT OF TISSUE EXPANDER AND FLEX HD (ACELLULAR HYDRATED DERMIS) Bilateral 08/27/2019   Procedure: BILATERAL BREAST RECONSTRUCTION WITH PLACEMENT OF TISSUE EXPANDER AND FLEX HD (ACELLULAR HYDRATED DERMIS);  Surgeon: Wallace Going, DO;  Location: [facility];  Service: Plastics;  Laterality: Bilateral;  . [place], 2004, 2013  . COLONOSCOPY  2015  . CYSTOSCOPY W/ RETROGRADES Right 09/13/2018   Procedure: CYSTOSCOPY WITH RETROGRADE PYELOGRAM;  Surgeon: Abbie Sons, MD;  Location: [facility];   Service: Urology;  Laterality: Right;  . CYSTOSCOPY/URETEROSCOPY/HOLMIUM LASER/STENT PLACEMENT Left 05/09/2018   Procedure: CYSTOSCOPY/URETEROSCOPY/HOLMIUM LASER/STENT PLACEMENT;  Surgeon: Abbie Sons, MD;  Location: [facility];  Service: Urology;  Laterality: Left;  . CYSTOSCOPY/URETEROSCOPY/HOLMIUM LASER/STENT PLACEMENT Right 09/13/2018   Procedure: CYSTOSCOPY/URETEROSCOPY/HOLMIUM LASER/STENT PLACEMENT;  Surgeon: Abbie Sons, MD;  Location: [facility];  Service: Urology;  Laterality: Right;  . INCONTINENCE SURGERY     BLADDER TUCK  . LIPOSUCTION WITH LIPOFILLING Bilateral 05/05/2020   Procedure: LIPOSUCTION WITH LIPOFILLING; LIPOSUCTION OF EXCESS LATERAL BREAST TISSUE;  Surgeon: Wallace Going, DO;  Location: [place];  Service: Plastics;  Laterality: Bilateral;  90 min  . PILONIDAL CYST EXCISION    . REMOVAL OF BILATERAL TISSUE EXPANDERS WITH PLACEMENT OF BILATERAL BREAST IMPLANTS Bilateral 01/02/2020   Procedure: REMOVAL OF BILATERAL TISSUE EXPANDERS WITH PLACEMENT OF BILATERAL BREAST IMPLANTS;  Surgeon: Wallace Going, DO;  Location: [place];  Service: Plastics;  Laterality: Bilateral;  2 hours, please  . STONE EXTRACTION WITH BASKET Right 09/13/2018   Procedure: STONE EXTRACTION WITH BASKET;  Surgeon: Abbie Sons, MD;  Location: [facility];  Service: Urology;  Laterality: Right;  . TOTAL MASTECTOMY Bilateral 08/27/2019   Procedure: TOTAL MASTECTOMY;  Surgeon: Herbert Pun, MD;  Location: [facility];  Service: General;  Laterality: Bilateral;  START _0  DUE TO SENTINEL  NODE  . UPPER GI ENDOSCOPY      There were no vitals filed for this visit.   Subjective Assessment - 07/10/20 1310    Subjective Tiffany Acevedo reports she is surprised that her pain has "really not been bad". Has not been as "good with her HEP as she would like" but she does feel like she is using her arm more now.    Pertinent History Pt is a 55 year old female s/p L  proximal humerus fracture 12/02/10. PMH double masectomy 08/27/19 with expander placement and reconstruction finished 01/03/20.Pt fell in [place] walking straight forward onto her shoulder. Patient reports fracture was non-displaced and she did not have surgery, by Dr. Adrian Prince advisement, wore sling 6 weeks. Currently does not know orthopedic expectations for weight restrictions/movement resistrictions, but has not been lifting more than 1/2 gallon of milk, but is no longer wearing sling. 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 Patient reports motion is very restricted and she is unable to don/doff clothes, her husband washes her hair for her, unable to push/pull, or lift anything. Pt is an avid golfer, and walks often. Worst shoulder pain over the past week 8/10 best 0/10 but reports she avoids painful movement.  Pt is R hand dominant. Pt denies N/V, B&B changes, unexplained weight fluctuation, saddle paresthesia, fever, night sweats, or unrelenting night pain at this time.    Limitations Lifting;House hold activities;Writing    How long can you sit comfortably? unlimited    How long can you stand comfortably? unlimited    How long can you walk comfortably? unlimited    Diagnostic tests Xrays [facility] 12/03/19    Patient Stated Goals Full ROM to complete self care    Pain Onset More than a month ago             Ther-Ex TRX flex/abd walkouts x12 Total gym "pull ups" L16 x5 with minA needed for last rep; L152x 5 with 10sec hang after last rep in each set  Band pull aparts/horizontal abd with YTB with back against wall for TC of scapular retraction 3x 8/7/6 with min cuing for technique, good carry over, unable to abd all the way to wall for most reps  Bent over rows 15# 3x 8 with neutral grip 3x 8   Supine flexion <> ext 5# x12 with min cuing for eccentric control and scapular retraction into mat table with good carry  over   Manual PROM all directions Success with contract relax (10sec contract 20sec relax) x2 all directions                         PT Education - 07/10/20 1312    Education Details therex form/technique    Person(s) Educated Patient    Methods Explanation;Demonstration;Verbal cues    Comprehension Verbalized understanding;Returned demonstration;Verbal cues required            PT Short Term Goals - 05/21/20 1535      PT SHORT TERM GOAL #1   Title Pt will be independent with HEP in order to improve strength and decrease pain in order to improve pain-free function at home and work.    Baseline 01/23/20 HEP given; 05/21/20 completing prior to surgery, HEP updated today    Time 4    Period Weeks    Status Revised      PT SHORT TERM GOAL #2   Title Pt will demonstrate  full PROM to demonstrate joint mobility needed for for full AROM    Baseline 05/21/20 flex: 143 abd: 105 ER: 54 IR: 72    Status New             PT Long Term Goals - 05/21/20 1531      PT LONG TERM GOAL #1   Title Pt will demonstrate full active L shoulder ROM in order to complete self care and overhead ADLS    Baseline 33/54/56 flex 39d abd 39d  ER L ear IR L PSIS; 05/22/10 flex 116d abd 81d  ER C7 IR T12    Time 8    Period Weeks    Status On-going      PT LONG TERM GOAL #2   Title Pt will decrease worst pain as reported on NPRS by at least 3 points in order to demonstrate clinically significant reduction in pain.    Baseline 01/25/20 8/10; 05/21/20    Time 8    Period Weeks    Status On-going      PT LONG TERM GOAL #3   Title Pt will demonstrate L gross shoulder strength of 5/5 in order to demonstrate PLOF and strength needed for heavy household tasks    Baseline 01/23/20 flex 2+ abd 2+ ER 2+ IR 2+; 05/21/20 In availible range: flex: 4+ abd: 4- ER: 4+ IR: 4+    Time 8    Period Weeks    Status On-going      PT LONG TERM GOAL #4   Title Patient will increase FOTO score to 79  to demonstrate predicted increase in functional mobility to complete ADLs                 Plan - 07/10/20 1328    Clinical Impression Statement PT continued therex progression for increased mobility and focus on overhead strengthening and periscapular strength with success. Patient with minimal regression following 2 week absence d/t work, which is positive. Paiten tis able to comply with all cuing for proper technique of therex, with good motivation and minimal increased pain. Pt with some difficulty with scapular retraction/scapulohumeral rhythm, but is able to correct this at least 75% with multimodal cuing. PT will continue progression as able.    Personal Factors and Comorbidities Comorbidity 1;Comorbidity 2;Comorbidity 3+;Past/Current Experience;Fitness;Time since onset of injury/illness/exacerbation    Comorbidities breast cancer, HTN, GERD, anxiety    Examination-Activity Limitations Bathing;Lift;Transfers;Reach Overhead;Carry;Dressing    Examination-Participation Restrictions Yard Work;Cleaning;Community Activity;Driving;Occupation;Laundry    Stability/Clinical Decision Making Evolving/Moderate complexity    Clinical Decision Making Moderate    Rehab Potential Good    PT Frequency 2x / week    PT Duration 8 weeks    PT Treatment/Interventions ADLs/Self Care Home Management;Electrical Stimulation;Therapeutic exercise;Balance training;Joint Manipulations;Taping;Spinal Manipulations;Cryotherapy;Iontophoresis 1m/ml Dexamethasone;DME Instruction;Neuromuscular re-education;Gait training;Stair training;Functional mobility training;Moist Heat;Traction;Ultrasound;Therapeutic activities;Patient/family education;Manual techniques;Dry needling;Passive range of motion;Vestibular    PT Next Visit Plan PROM/AAROM    PT Home Exercise Plan see 04/07/20 note    Consulted and Agree with Plan of Care Patient           Patient will benefit from skilled therapeutic intervention in order to improve  the following deficits and impairments:  Decreased balance,Impaired flexibility,Impaired UE functional use,Hypomobility,Decreased strength,Decreased range of motion,Decreased endurance,Decreased activity tolerance,Postural dysfunction,Increased muscle spasms,Hypermobility,Decreased mobility,Decreased coordination,Abnormal gait,Pain,Improper body mechanics,Increased fascial restricitons  Visit Diagnosis: Acute pain of left shoulder  Stiffness of left shoulder, not elsewhere classified     Problem List Patient Active Problem List   Diagnosis Date  Noted  . Breast asymmetry following reconstructive surgery 03/25/2020  . S/P breast reconstruction, bilateral 03/06/2020  . Sebaceous cyst 12/31/2019  . Breast wound 10/05/2019  . Acquired absence of bilateral breasts and nipples 09/04/2019  . Genetic testing 08/10/2019  . Monoallelic mutation of CHEK2 gene in female patient 08/07/2019  . Malignant neoplasm of upper-outer quadrant of right breast in female, estrogen receptor positive ([place]) 07/30/2019  . Goals of care, counseling/discussion 07/30/2019  . Family history of prostate cancer   . Family history of colon cancer   . Breast cancer ([place]) 07/25/2019  . Left lower quadrant pain 05/23/2018  . Personal history of kidney stones 04/20/2018  . Microscopic hematuria 04/20/2018  . 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 Overweight (BMI 25.0-29.9) 01/22/2017  . Encounter for preventive health examination 06/30/2016  . Renal colic 23/00/9794  . Ureteral calculus 04/12/2015  . Nephrolithiasis 03/04/2015  . Intractable migraine with aura without status migrainosus 10/29/2013  . Lack of libido 06/24/2013  . Migraine syndrome 03/11/2013  . Generalized anxiety disorder 03/11/2013   Durwin Reges DPT Durwin Reges 07/10/2020, 2:01 PM  [facility] [address] [place], [address] Phone: [phone]    Fax:  [phone]  Name: Aspin Palomarez MRN: [id number] Date of Birth: Sep 03, 1965

## 2020-07-11 ENCOUNTER — Ambulatory Visit: Payer: BC Managed Care – PPO | Admitting: Physical Therapy

## 2020-07-16 ENCOUNTER — Ambulatory Visit: Payer: BC Managed Care – PPO | Admitting: Physical Therapy

## 2020-07-17 ENCOUNTER — Other Ambulatory Visit: Payer: Self-pay

## 2020-07-17 ENCOUNTER — Ambulatory Visit: Payer: BC Managed Care – PPO | Admitting: Physical Therapy

## 2020-07-17 ENCOUNTER — Encounter: Payer: Self-pay | Admitting: Physical Therapy

## 2020-07-17 DIAGNOSIS — M25512 Pain in left shoulder: Secondary | ICD-10-CM

## 2020-07-17 DIAGNOSIS — M25612 Stiffness of left shoulder, not elsewhere classified: Secondary | ICD-10-CM

## 2020-07-17 DIAGNOSIS — Z9013 Acquired absence of bilateral breasts and nipples: Secondary | ICD-10-CM

## 2020-07-17 NOTE — Therapy (Signed)
[facility] [address] [place], [address] Phone: [phone]   Fax:  [phone]  Physical Therapy Treatment  Patient Details  Name: Tiffany Acevedo MRN: [id number] Date of Birth: Jan 18, 1966 No data recorded  Encounter Date: 07/17/2020    Past Medical History:  Diagnosis Date   Allergy    Anxiety    Breast cancer ([place])    Depression    Excessive weight gain 06/30/2016   Family history of colon cancer    Family history of colon cancer    Family history of prostate cancer    Frequent headaches    GERD (gastroesophageal reflux disease)    History of kidney stones    Hypertension    Migraines    Pre-diabetes     Past Surgical History:  Procedure Laterality Date   BREAST BIOPSY Right 07/13/2019   stereo biopsy/ x clip/path pending   BREAST BIOPSY Right 07/13/2019   stereo biopsy/coil clip/ path pending   BREAST RECONSTRUCTION WITH PLACEMENT OF TISSUE EXPANDER AND FLEX HD (ACELLULAR HYDRATED DERMIS) Bilateral 08/27/2019   Procedure: BILATERAL BREAST RECONSTRUCTION WITH PLACEMENT OF TISSUE EXPANDER AND FLEX HD (ACELLULAR HYDRATED DERMIS);  Surgeon: Wallace Going, DO;  Location: [facility];  Service: Plastics;  Laterality: Bilateral;   [place], 2004, 2013   COLONOSCOPY  2015   CYSTOSCOPY W/ RETROGRADES Right 09/13/2018   Procedure: CYSTOSCOPY WITH RETROGRADE PYELOGRAM;  Surgeon: Abbie Sons, MD;  Location: [facility];  Service: Urology;  Laterality: Right;   CYSTOSCOPY/URETEROSCOPY/HOLMIUM LASER/STENT PLACEMENT Left 05/09/2018   Procedure: CYSTOSCOPY/URETEROSCOPY/HOLMIUM LASER/STENT PLACEMENT;  Surgeon: Abbie Sons, MD;  Location: [facility];  Service: Urology;  Laterality: Left;   CYSTOSCOPY/URETEROSCOPY/HOLMIUM LASER/STENT PLACEMENT Right 09/13/2018   Procedure: CYSTOSCOPY/URETEROSCOPY/HOLMIUM LASER/STENT PLACEMENT;  Surgeon: Abbie Sons, MD;  Location: [facility];   Service: Urology;  Laterality: Right;   INCONTINENCE SURGERY     BLADDER TUCK   LIPOSUCTION WITH LIPOFILLING Bilateral 05/05/2020   Procedure: LIPOSUCTION WITH LIPOFILLING; LIPOSUCTION OF EXCESS LATERAL BREAST TISSUE;  Surgeon: Wallace Going, DO;  Location: [place];  Service: Plastics;  Laterality: Bilateral;  90 min   PILONIDAL CYST EXCISION     REMOVAL OF BILATERAL TISSUE EXPANDERS WITH PLACEMENT OF BILATERAL BREAST IMPLANTS Bilateral 01/02/2020   Procedure: REMOVAL OF BILATERAL TISSUE EXPANDERS WITH PLACEMENT OF BILATERAL BREAST IMPLANTS;  Surgeon: Wallace Going, DO;  Location: [facility];  Service: Plastics;  Laterality: Bilateral;  2 hours, please   STONE EXTRACTION WITH BASKET Right 09/13/2018   Procedure: STONE EXTRACTION WITH BASKET;  Surgeon: Abbie Sons, MD;  Location: [facility];  Service: Urology;  Laterality: Right;   TOTAL MASTECTOMY Bilateral 08/27/2019   Procedure: TOTAL MASTECTOMY;  Surgeon: Herbert Pun, MD;  Location: [facility];  Service: General;  Laterality: Bilateral;  START _0  DUE TO SENTINEL NODE   UPPER GI ENDOSCOPY      There were no vitals filed for this visit.    Ther-Ex TRX flex/abd walkouts x12 Total gym "pull ups" L16 x5 with minA needed for last rep; L15 2x 5 with 10sec hang after last rep in each set  Seated DB chest press 2# DB x8; 3# 2x 6 with min cuing for technique with good carry over following   Push up position on very elevated mat table alt scaption lifts 2x 12 with cuing for L scapular stability with RUE lift   HEP  Manual  PROM all directions Success with contract relax (10sec contract 20sec relax) x2 all directions                            PT Short Term Goals - 07/17/20 1730       PT SHORT TERM GOAL #1   Title Pt will be independent with HEP in order to improve strength and decrease pain in order to improve pain-free function at home and work.     Baseline 01/23/20 HEP given; 05/21/20 completing prior to surgery, HEP updated today; 07/18/20 Completing HEP 50% of the time    Time 4    Period Weeks    Status Revised      PT SHORT TERM GOAL #2   Title Pt will demonstrate full PROM to demonstrate joint mobility needed for for full AROM    Baseline 05/21/20 flex: 143 abd: 105 ER: 54 IR: 72:  07/17/20 flex 154d abd 106 ER 72d IR 76d    Time 4    Period Weeks    Status Revised               PT Long Term Goals - 07/17/20 1706       PT LONG TERM GOAL #1   Title Pt will demonstrate full active L shoulder ROM in order to complete self care and overhead ADLS    Baseline 01/23/20 flex 39d abd 39d  ER L ear IR L PSIS; 05/21/20 flex 116d abd 81d  ER C7 IR T12; 07/17/20 flex 134d abd 92d  ER C7 IR T12    Time 8    Period Weeks    Status On-going      PT LONG TERM GOAL #2   Title Pt will decrease worst pain as reported on NPRS by at least 3 points in order to demonstrate clinically significant reduction in pain.    Baseline 01/25/20 8/10; 05/21/20 7/10; 07/17/20 4/10    Time 8    Period Weeks    Status Achieved      PT LONG TERM GOAL #3   Title Pt will demonstrate L gross shoulder strength of 5/5 in order to demonstrate PLOF and strength needed for heavy household tasks    Baseline 01/23/20 flex 2+ abd 2+ ER 2+ IR 2+; 05/21/20 In availible range: flex: 4+ abd: 4- ER: 4+ IR: 4+; 07/17/20 In availible range: flex: 4+ abd: 4- ER: 4+ IR: 4+    Time 8    Period Weeks    Status On-going      PT LONG TERM GOAL #4   Title Patient will increase FOTO score to 79 to demonstrate predicted increase in functional mobility to complete ADLs    Baseline 07/17/20    Time 8    Period Weeks    Status New                    Patient will benefit from skilled therapeutic intervention in order to improve the following deficits and impairments:  Decreased balance, Impaired flexibility, Impaired UE functional use, Hypomobility, Decreased strength,  Decreased range of motion, Decreased endurance, Decreased activity tolerance, Postural dysfunction, Increased muscle spasms, Hypermobility, Decreased mobility, Decreased coordination, Abnormal gait, Pain, Improper body mechanics, Increased fascial restricitons  Visit Diagnosis: Acute pain of left shoulder  Stiffness of left shoulder, not elsewhere classified  Status post bilateral mastectomy     Problem List Patient Active Problem List     Diagnosis Date Noted   Breast asymmetry following reconstructive surgery 03/25/2020   S/P breast reconstruction, bilateral 03/06/2020   Sebaceous cyst 12/31/2019   Breast wound 10/05/2019   Acquired absence of bilateral breasts and nipples 09/04/2019   Genetic testing 08/10/2019   Monoallelic mutation of CHEK2 gene in female patient 08/07/2019   Malignant neoplasm of upper-outer quadrant of right breast in female, estrogen receptor positive ([facility]) 07/30/2019   Goals of care, counseling/discussion 07/30/2019   Family history of prostate cancer    Family history of colon cancer    Breast cancer ([facility]) 07/25/2019   Left lower quadrant pain 05/23/2018   Personal history of kidney stones 04/20/2018   Microscopic hematuria 04/20/2018   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 [place], [place], [address] Phone: [phone]   Fax:  [phone]  Name: Heavenleigh [place] MRN: [id number] Date of Birth: 12/17/1965    

## 2020-07-22 ENCOUNTER — Other Ambulatory Visit: Payer: Self-pay

## 2020-07-22 ENCOUNTER — Ambulatory Visit: Payer: BC Managed Care – PPO | Admitting: Physical Therapy

## 2020-07-22 ENCOUNTER — Encounter: Payer: Self-pay | Admitting: Physical Therapy

## 2020-07-22 DIAGNOSIS — M25512 Pain in left shoulder: Secondary | ICD-10-CM | POA: Diagnosis not present

## 2020-07-22 DIAGNOSIS — M25612 Stiffness of left shoulder, not elsewhere classified: Secondary | ICD-10-CM

## 2020-07-22 NOTE — Therapy (Signed)
[facility] [address], [place], [address] Phone: [phone]   Fax:  [phone]  Physical Therapy Treatment  Patient Details  Name: Tiffany Acevedo MRN: [id number] Date of Birth: 1965/07/19 No data recorded  Encounter Date: 07/22/2020   PT End of Session - 07/22/20 1627     Visit Number 30    Number of Visits 50    Date for PT Re-Evaluation 09/12/20    Authorization - Visit Number 25    Authorization - Number of Visits 100    PT Start Time 1625    PT Stop Time 1655    PT Time Calculation (min) 30 min    Activity Tolerance Patient tolerated treatment well    Behavior During Therapy [facility] for tasks assessed/performed             Past Medical History:  Diagnosis Date   Allergy    Anxiety    Breast cancer ([place])    Depression    Excessive weight gain 06/30/2016   Family history of colon cancer    Family history of colon cancer    Family history of prostate cancer    Frequent headaches    GERD (gastroesophageal reflux disease)    History of kidney stones    Hypertension    Migraines    Pre-diabetes     Past Surgical History:  Procedure Laterality Date   BREAST BIOPSY Right 07/13/2019   stereo biopsy/ x clip/path pending   BREAST BIOPSY Right 07/13/2019   stereo biopsy/coil clip/ path pending   BREAST RECONSTRUCTION WITH PLACEMENT OF TISSUE EXPANDER AND FLEX HD (ACELLULAR HYDRATED DERMIS) Bilateral 08/27/2019   Procedure: BILATERAL BREAST RECONSTRUCTION WITH PLACEMENT OF TISSUE EXPANDER AND FLEX HD (ACELLULAR HYDRATED DERMIS);  Surgeon: Wallace Going, DO;  Location: [facility];  Service: Plastics;  Laterality: Bilateral;   [place], 2004, 2013   COLONOSCOPY  2015   CYSTOSCOPY W/ RETROGRADES Right 09/13/2018   Procedure: CYSTOSCOPY WITH RETROGRADE PYELOGRAM;  Surgeon: Abbie Sons, MD;  Location: [facility];  Service: Urology;  Laterality: Right;    CYSTOSCOPY/URETEROSCOPY/HOLMIUM LASER/STENT PLACEMENT Left 05/09/2018   Procedure: CYSTOSCOPY/URETEROSCOPY/HOLMIUM LASER/STENT PLACEMENT;  Surgeon: Abbie Sons, MD;  Location: [facility];  Service: Urology;  Laterality: Left;   CYSTOSCOPY/URETEROSCOPY/HOLMIUM LASER/STENT PLACEMENT Right 09/13/2018   Procedure: CYSTOSCOPY/URETEROSCOPY/HOLMIUM LASER/STENT PLACEMENT;  Surgeon: Abbie Sons, MD;  Location: [facility];  Service: Urology;  Laterality: Right;   INCONTINENCE SURGERY     BLADDER TUCK   LIPOSUCTION WITH LIPOFILLING Bilateral 05/05/2020   Procedure: LIPOSUCTION WITH LIPOFILLING; LIPOSUCTION OF EXCESS LATERAL BREAST TISSUE;  Surgeon: Wallace Going, DO;  Location: [place];  Service: Plastics;  Laterality: Bilateral;  90 min   PILONIDAL CYST EXCISION     REMOVAL OF BILATERAL TISSUE EXPANDERS WITH PLACEMENT OF BILATERAL BREAST IMPLANTS Bilateral 01/02/2020   Procedure: REMOVAL OF BILATERAL TISSUE EXPANDERS WITH PLACEMENT OF BILATERAL BREAST IMPLANTS;  Surgeon: Wallace Going, DO;  Location: [place];  Service: Plastics;  Laterality: Bilateral;  2 hours, please   STONE EXTRACTION WITH BASKET Right 09/13/2018   Procedure: STONE EXTRACTION WITH BASKET;  Surgeon: Abbie Sons, MD;  Location: [facility];  Service: Urology;  Laterality: Right;   TOTAL MASTECTOMY Bilateral 08/27/2019   Procedure: TOTAL MASTECTOMY;  Surgeon: Herbert Pun, MD;  Location: [facility];  Service: General;  Laterality: Bilateral;  START _0  DUE TO SENTINEL NODE   UPPER GI  ENDOSCOPY      There were no vitals filed for this visit.   Subjective Assessment - 07/22/20 1633     Subjective Pt reports L shoulder being very painful following last session. Pt reports active participation in [place].    Pertinent History Pt is a 55 year old female s/p L proximal humerus fracture 12/02/10. PMH double masectomy 08/27/19 with expander placement and reconstruction finished 01/03/20.Pt  fell in [place] walking straight forward onto her shoulder. Patient reports fracture was non-displaced and she did not have surgery, by Dr. Adrian Prince advisement, wore sling 6 weeks. Currently does not know orthopedic expectations for weight restrictions/movement resistrictions, but has not been lifting more than 1/2 gallon of milk, but is no longer wearing sling. 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 Patient reports motion is very restricted and she is unable to don/doff clothes, her husband washes her hair for her, unable to push/pull, or lift anything. Pt is an avid golfer, and walks often. Worst shoulder pain over the past week 8/10 best 0/10 but reports she avoids painful movement.  Pt is R hand dominant. Pt denies N/V, B&B changes, unexplained weight fluctuation, saddle paresthesia, fever, night sweats, or unrelenting night pain at this time.    Limitations Lifting;House hold activities;Writing              Therapeutic Exercise  TRX walk outs ABD 1 x 30 Lat Pull downs 1 x12 # 25, 1 x 12 #35 Standing ER GTB 2 x 12 reps  Manual Therapy  L glenohumeral jt distraction  L glenohumeral jt lateral distraction  Inf Mobilization with movement L shoulder Flex grade 3 30sec bouts 2 bouts  MET IR/ER @ 75 deg ABD *patient was able to touch the table in IR following; 5sec contract 5sec relaxion                            PT Education - 07/23/20 1032     Education Details therex form/technique    Person(s) Educated Patient    Methods Explanation;Demonstration;Verbal cues    Comprehension Verbalized understanding;Returned demonstration;Verbal cues required              PT Short Term Goals - 07/17/20 1730       PT SHORT TERM GOAL #1   Title Pt will be independent with HEP in order to improve strength and decrease pain in order to improve pain-free function at home and work.    Baseline 01/23/20 HEP given;  05/21/20 completing prior to surgery, HEP updated today; 07/18/20 Completing HEP 50% of the time    Time 4    Period Weeks    Status Revised      PT SHORT TERM GOAL #2   Title Pt will demonstrate full PROM to demonstrate joint mobility needed for for full AROM    Baseline 05/21/20 flex: 143 abd: 105 ER: 54 IR: 72:  07/17/20 flex 154d abd 106 ER 72d IR 76d    Time 4    Period Weeks    Status Revised               PT Long Term Goals - 07/17/20 1706       PT LONG TERM GOAL #1   Title Pt will demonstrate full active L shoulder ROM in order to complete self care and overhead ADLS    Baseline 77/93/90 flex 39d abd 39d  ER L ear IR L PSIS; 05/21/20 flex 116d abd 81d  ER C7 IR T12; 07/17/20 flex 134d abd 92d  ER C7 IR T12    Time 8    Period Weeks    Status On-going      PT LONG TERM GOAL #2   Title Pt will decrease worst pain as reported on NPRS by at least 3 points in order to demonstrate clinically significant reduction in pain.    Baseline 01/25/20 8/10; 05/21/20 7/10; 07/17/20 4/10    Time 8    Period Weeks    Status Achieved      PT LONG TERM GOAL #3   Title Pt will demonstrate L gross shoulder strength of 5/5 in order to demonstrate PLOF and strength needed for heavy household tasks    Baseline 01/23/20 flex 2+ abd 2+ ER 2+ IR 2+; 05/21/20 In availible range: flex: 4+ abd: 4- ER: 4+ IR: 4+; 07/17/20 In availible range: flex: 4+ abd: 4- ER: 4+ IR: 4+    Time 8    Period Weeks    Status On-going      PT LONG TERM GOAL #4   Title Patient will increase FOTO score to 79 to demonstrate predicted increase in functional mobility to complete ADLs    Baseline 07/17/20    Time 8    Period Weeks    Status New                   Plan - 07/22/20 1707     Clinical Impression Statement Patient tolerated session well evidenced by increased L shoulder IR ROM without increase in pain following manual therapy technique. PT to continue focusing on increasing L shoulder mobilty and  periscapular strengthening in order to address impairments and achieve set goals. Patient continues to have good motivation and will continue to benefit from skilled physical therapy.    Personal Factors and Comorbidities Comorbidity 1;Comorbidity 2;Comorbidity 3+;Past/Current Experience;Fitness;Time since onset of injury/illness/exacerbation    Examination-Activity Limitations Bathing;Lift;Transfers;Reach Overhead;Carry;Dressing    Examination-Participation Restrictions Yard Work;Cleaning;Community Activity;Driving;Occupation;Laundry    PT Treatment/Interventions ADLs/Self Care Home Management;Electrical Stimulation;Therapeutic exercise;Balance training;Joint Manipulations;Taping;Spinal Manipulations;Cryotherapy;Iontophoresis 83m/ml Dexamethasone;DME Instruction;Neuromuscular re-education;Gait training;Stair training;Functional mobility training;Moist Heat;Traction;Ultrasound;Therapeutic activities;Patient/family education;Manual techniques;Dry needling;Passive range of motion;Vestibular    PT Next Visit Plan Manual therapy: MWM, Low load long duration stretching, and joint mobilizations    PT Home Exercise Plan see 04/07/20 note             Patient will benefit from skilled therapeutic intervention in order to improve the following deficits and impairments:  Decreased balance, Impaired flexibility, Impaired UE functional use, Hypomobility, Decreased strength, Decreased range of motion, Decreased endurance, Decreased activity tolerance, Postural dysfunction, Increased muscle spasms, Hypermobility, Decreased mobility, Decreased coordination, Abnormal gait, Pain, Improper body mechanics, Increased fascial restricitons  Visit Diagnosis: Stiffness of left shoulder, not elsewhere classified     Problem List Patient Active Problem List   Diagnosis Date Noted   Breast asymmetry following reconstructive surgery 03/25/2020   S/P breast reconstruction, bilateral 03/06/2020   Sebaceous cyst  12/31/2019   Breast wound 10/05/2019   Acquired absence of bilateral breasts and nipples 09/04/2019   Genetic testing 057/84/6962  Monoallelic mutation of CHEK2 gene in female patient 08/07/2019   Malignant neoplasm of upper-outer quadrant of right breast in female, estrogen receptor positive [place] 07/30/2019   Goals of care, counseling/discussion 07/30/2019   Family history of prostate cancer    Family history of colon cancer    Breast  cancer ([place]) 07/25/2019   Left lower quadrant pain 05/23/2018   Personal history of kidney stones 04/20/2018   Microscopic hematuria 04/20/2018   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 [place], [address] Phone: [phone]   Fax:  [phone]  Name: Zamoria Boss MRN: [id number] Date of Birth: 02/28/1965

## 2020-07-24 ENCOUNTER — Ambulatory Visit (INDEPENDENT_AMBULATORY_CARE_PROVIDER_SITE_OTHER): Payer: BC Managed Care – PPO

## 2020-07-24 ENCOUNTER — Other Ambulatory Visit: Payer: Self-pay

## 2020-07-24 VITALS — BP 140/79 | HR 82 | Temp 98.1°F | Ht 64.0 in | Wt 154.0 lb

## 2020-07-24 DIAGNOSIS — Z719 Counseling, unspecified: Secondary | ICD-10-CM

## 2020-07-24 DIAGNOSIS — Z9889 Other specified postprocedural states: Secondary | ICD-10-CM

## 2020-07-24 DIAGNOSIS — Z9013 Acquired absence of bilateral breasts and nipples: Secondary | ICD-10-CM

## 2020-07-28 ENCOUNTER — Encounter: Payer: Self-pay | Admitting: Physical Therapy

## 2020-07-28 ENCOUNTER — Ambulatory Visit: Payer: BC Managed Care – PPO | Admitting: Physical Therapy

## 2020-07-28 ENCOUNTER — Other Ambulatory Visit: Payer: Self-pay

## 2020-07-28 DIAGNOSIS — M25612 Stiffness of left shoulder, not elsewhere classified: Secondary | ICD-10-CM

## 2020-07-28 DIAGNOSIS — M25512 Pain in left shoulder: Secondary | ICD-10-CM

## 2020-07-28 NOTE — Therapy (Signed)
[facility] [address] [place], [address] Phone: [phone]   Fax:  [phone]  Physical Therapy Treatment  Patient Details  Name: Tiffany Acevedo MRN: [id number] Date of Birth: Apr 05, 1965 No data recorded  Encounter Date: 07/28/2020   PT End of Session - 07/28/20 1648     Visit Number 31    Number of Visits 65    Date for PT Re-Evaluation 09/12/20    Authorization Time Period no limit    Authorization - Visit Number 42    Authorization - Number of Visits 100    PT Start Time 9024    PT Stop Time 0973    PT Time Calculation (min) 38 min    Activity Tolerance Patient tolerated treatment well    Behavior During Therapy [facility] for tasks assessed/performed             Past Medical History:  Diagnosis Date   Allergy    Anxiety    Breast cancer ([place])    Depression    Excessive weight gain 06/30/2016   Family history of colon cancer    Family history of colon cancer    Family history of prostate cancer    Frequent headaches    GERD (gastroesophageal reflux disease)    History of kidney stones    Hypertension    Migraines    Pre-diabetes     Past Surgical History:  Procedure Laterality Date   BREAST BIOPSY Right 07/13/2019   stereo biopsy/ x clip/path pending   BREAST BIOPSY Right 07/13/2019   stereo biopsy/coil clip/ path pending   BREAST RECONSTRUCTION WITH PLACEMENT OF TISSUE EXPANDER AND FLEX HD (ACELLULAR HYDRATED DERMIS) Bilateral 08/27/2019   Procedure: BILATERAL BREAST RECONSTRUCTION WITH PLACEMENT OF TISSUE EXPANDER AND FLEX HD (ACELLULAR HYDRATED DERMIS);  Surgeon: Wallace Going, DO;  Location: [facility];  Service: Plastics;  Laterality: Bilateral;   [place], 2004, 2013   COLONOSCOPY  2015   CYSTOSCOPY W/ RETROGRADES Right 09/13/2018   Procedure: CYSTOSCOPY WITH RETROGRADE PYELOGRAM;  Surgeon: Abbie Sons, MD;  Location: [facility];  Service: Urology;   Laterality: Right;   CYSTOSCOPY/URETEROSCOPY/HOLMIUM LASER/STENT PLACEMENT Left 05/09/2018   Procedure: CYSTOSCOPY/URETEROSCOPY/HOLMIUM LASER/STENT PLACEMENT;  Surgeon: Abbie Sons, MD;  Location: [facility];  Service: Urology;  Laterality: Left;   CYSTOSCOPY/URETEROSCOPY/HOLMIUM LASER/STENT PLACEMENT Right 09/13/2018   Procedure: CYSTOSCOPY/URETEROSCOPY/HOLMIUM LASER/STENT PLACEMENT;  Surgeon: Abbie Sons, MD;  Location: [facility];  Service: Urology;  Laterality: Right;   INCONTINENCE SURGERY     BLADDER TUCK   LIPOSUCTION WITH LIPOFILLING Bilateral 05/05/2020   Procedure: LIPOSUCTION WITH LIPOFILLING; LIPOSUCTION OF EXCESS LATERAL BREAST TISSUE;  Surgeon: Wallace Going, DO;  Location: [place];  Service: Plastics;  Laterality: Bilateral;  90 min   PILONIDAL CYST EXCISION     REMOVAL OF BILATERAL TISSUE EXPANDERS WITH PLACEMENT OF BILATERAL BREAST IMPLANTS Bilateral 01/02/2020   Procedure: REMOVAL OF BILATERAL TISSUE EXPANDERS WITH PLACEMENT OF BILATERAL BREAST IMPLANTS;  Surgeon: Wallace Going, DO;  Location: [place];  Service: Plastics;  Laterality: Bilateral;  2 hours, please   STONE EXTRACTION WITH BASKET Right 09/13/2018   Procedure: STONE EXTRACTION WITH BASKET;  Surgeon: Abbie Sons, MD;  Location: [facility];  Service: Urology;  Laterality: Right;   TOTAL MASTECTOMY Bilateral 08/27/2019   Procedure: TOTAL MASTECTOMY;  Surgeon: Herbert Pun, MD;  Location: [facility];  Service: General;  Laterality: Bilateral;  START @930   DUE TO SENTINEL NODE   UPPER GI ENDOSCOPY      There were no vitals filed for this visit.   Subjective Assessment - 07/28/20 1616     Subjective Patient reports no pain today, which is an improvement from last visit. REports compliance with HEP.    Pertinent History Pt is a 55 year old female s/p L proximal humerus fracture 12/02/10. PMH double masectomy 08/27/19 with expander placement and reconstruction  finished 01/03/20.Pt fell in [place] walking straight forward onto her shoulder. Patient reports fracture was non-displaced and she did not have surgery, by Dr. Adrian Prince advisement, wore sling 6 weeks. Currently does not know orthopedic expectations for weight restrictions/movement resistrictions, but has not been lifting more than 1/2 gallon of milk, but is no longer wearing sling. 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 Patient reports motion is very restricted and she is unable to don/doff clothes, her husband washes her hair for her, unable to push/pull, or lift anything. Pt is an avid golfer, and walks often. Worst shoulder pain over the past week 8/10 best 0/10 but reports she avoids painful movement.  Pt is R hand dominant. Pt denies N/V, B&B changes, unexplained weight fluctuation, saddle paresthesia, fever, night sweats, or unrelenting night pain at this time.    Limitations Lifting;House hold activities;Writing    How long can you sit comfortably? unlimited    How long can you stand comfortably? unlimited    How long can you walk comfortably? unlimited    Diagnostic tests Xrays [facility] 12/03/19    Patient Stated Goals Full ROM to complete self care    Pain Onset More than a month ago           Therapeutic Exercise   TRX walk outs flex/ABD x15  Total gym pull up L15 3x 5 with 10sec hang at last rep   OMEGA chest press slight overhead 15# x10; 20# 2x 8/6 with min cuing for reducing shoulder hiking   Manual Therapy   L glenohumeral jt distraction   L glenohumeral jt lateral distraction   Inf Mobilization with movement L shoulder Flex grade 3 30sec bouts 2 bouts   STM with trigger point release to L UT, pec minor, and latissimus Following: Dry Needling: (2) 35m .25 needles placed along the L UT and latissimus to decrease increased muscular spasms and trigger points with the patient positioned in supine. Patient was educated  on risks and benefits of therapy and verbally consents to PT.                                  PT Education - 07/28/20 1627     Education Details therex form/technique    Person(s) Educated Patient    Methods Explanation;Demonstration;Verbal cues    Comprehension Verbalized understanding;Returned demonstration;Verbal cues required              PT Short Term Goals - 07/17/20 1730       PT SHORT TERM GOAL #1   Title Pt will be independent with HEP in order to improve strength and decrease pain in order to improve pain-free function at home and work.    Baseline 01/23/20 HEP given; 05/21/20 completing prior to surgery, HEP updated today; 07/18/20 Completing HEP 50% of the time    Time 4    Period Weeks    Status Revised  PT SHORT TERM GOAL #2   Title Pt will demonstrate full PROM to demonstrate joint mobility needed for for full AROM    Baseline 05/21/20 flex: 143 abd: 105 ER: 54 IR: 72:  07/17/20 flex 154d abd 106 ER 72d IR 76d    Time 4    Period Weeks    Status Revised               PT Long Term Goals - 07/17/20 1706       PT LONG TERM GOAL #1   Title Pt will demonstrate full active L shoulder ROM in order to complete self care and overhead ADLS    Baseline 10/31/28 flex 39d abd 39d  ER L ear IR L PSIS; 05/21/20 flex 116d abd 81d  ER C7 IR T12; 07/17/20 flex 134d abd 92d  ER C7 IR T12    Time 8    Period Weeks    Status On-going      PT LONG TERM GOAL #2   Title Pt will decrease worst pain as reported on NPRS by at least 3 points in order to demonstrate clinically significant reduction in pain.    Baseline 01/25/20 8/10; 05/21/20 7/10; 07/17/20 4/10    Time 8    Period Weeks    Status Achieved      PT LONG TERM GOAL #3   Title Pt will demonstrate L gross shoulder strength of 5/5 in order to demonstrate PLOF and strength needed for heavy household tasks    Baseline 01/23/20 flex 2+ abd 2+ ER 2+ IR 2+; 05/21/20 In availible range: flex: 4+  abd: 4- ER: 4+ IR: 4+; 07/17/20 In availible range: flex: 4+ abd: 4- ER: 4+ IR: 4+    Time 8    Period Weeks    Status On-going      PT LONG TERM GOAL #4   Title Patient will increase FOTO score to 79 to demonstrate predicted increase in functional mobility to complete ADLs    Baseline 07/17/20    Time 8    Period Weeks    Status New                   Plan - 07/28/20 1649     Clinical Impression Statement PT continued therex progression with overhead focus with patient able to comply with all cuing for proper technique of therex. PT utilized TDN with manual techniques for increased mobility with increase in flex and abd approx2-d, but with clunking with lowering following. Paitent reports discomfort with this, negative sulcus sign. Pt to follow up with MD about cortisone injections, PT suggesting possible further MD follow up as well. PT will continue progression as able.    Personal Factors and Comorbidities Comorbidity 1;Comorbidity 2;Comorbidity 3+;Past/Current Experience;Fitness;Time since onset of injury/illness/exacerbation    Comorbidities breast cancer, HTN, GERD, anxiety    Examination-Activity Limitations Bathing;Lift;Transfers;Reach Overhead;Carry;Dressing    Examination-Participation Restrictions Yard Work;Cleaning;Community Activity;Driving;Occupation;Laundry    Stability/Clinical Decision Making Evolving/Moderate complexity    Clinical Decision Making Moderate    Rehab Potential Good    PT Frequency 2x / week    PT Duration 8 weeks    PT Treatment/Interventions ADLs/Self Care Home Management;Electrical Stimulation;Therapeutic exercise;Balance training;Joint Manipulations;Taping;Spinal Manipulations;Cryotherapy;Iontophoresis 56m/ml Dexamethasone;DME Instruction;Neuromuscular re-education;Gait training;Stair training;Functional mobility training;Moist Heat;Traction;Ultrasound;Therapeutic activities;Patient/family education;Manual techniques;Dry needling;Passive range of  motion;Vestibular    PT Next Visit Plan Manual therapy: MWM, Low load long duration stretching, and joint mobilizations    PT Home Exercise Plan see 04/07/20 note  Consulted and Agree with Plan of Care Patient             Patient will benefit from skilled therapeutic intervention in order to improve the following deficits and impairments:  Decreased balance, Impaired flexibility, Impaired UE functional use, Hypomobility, Decreased strength, Decreased range of motion, Decreased endurance, Decreased activity tolerance, Postural dysfunction, Increased muscle spasms, Hypermobility, Decreased mobility, Decreased coordination, Abnormal gait, Pain, Improper body mechanics, Increased fascial restricitons  Visit Diagnosis: Stiffness of left shoulder, not elsewhere classified  Acute pain of left shoulder     Problem List Patient Active Problem List   Diagnosis Date Noted   Breast asymmetry following reconstructive surgery 03/25/2020   S/P breast reconstruction, bilateral 03/06/2020   Sebaceous cyst 12/31/2019   Breast wound 10/05/2019   Acquired absence of bilateral breasts and nipples 09/04/2019   Genetic testing 71/16/5790   Monoallelic mutation of CHEK2 gene in female patient 08/07/2019   Malignant neoplasm of upper-outer quadrant of right breast in female, estrogen receptor positive ([place]) 07/30/2019   Goals of care, counseling/discussion 07/30/2019   Family history of prostate cancer    Family history of colon cancer    Breast cancer (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, [place], [address] Phone: [phone]   Fax:  [phone]  Name: Tiffany Acevedo MRN: [id number] Date of Birth: [birth year]

## 2020-08-04 ENCOUNTER — Ambulatory Visit: Payer: BC Managed Care – PPO | Admitting: Physical Therapy

## 2020-08-06 ENCOUNTER — Other Ambulatory Visit: Payer: Self-pay

## 2020-08-06 ENCOUNTER — Ambulatory Visit: Payer: BC Managed Care – PPO | Admitting: Physical Therapy

## 2020-08-06 ENCOUNTER — Encounter: Payer: Self-pay | Admitting: Physical Therapy

## 2020-08-06 DIAGNOSIS — M25512 Pain in left shoulder: Secondary | ICD-10-CM

## 2020-08-06 DIAGNOSIS — M25612 Stiffness of left shoulder, not elsewhere classified: Secondary | ICD-10-CM

## 2020-08-06 NOTE — Therapy (Signed)
[facility] [address] [place], [address] Phone: [phone]   Fax:  [phone]  Physical Therapy Treatment  Patient Details  Name: Tiffany Acevedo MRN: [id number] Date of Birth: 1965/10/26 No data recorded  Encounter Date: 08/06/2020   PT End of Session - 08/06/20 1711     Visit Number 32    Number of Visits 50    Date for PT Re-Evaluation 09/12/20    Authorization Time Period no limit    Authorization - Visit Number 67    Authorization - Number of Visits 100    PT Start Time 6681    PT Stop Time 1729    PT Time Calculation (min) 39 min    Activity Tolerance Patient tolerated treatment well    Behavior During Therapy [facility] for tasks assessed/performed             Past Medical History:  Diagnosis Date   Allergy    Anxiety    Breast cancer ([place])    Depression    Excessive weight gain 06/30/2016   Family history of colon cancer    Family history of colon cancer    Family history of prostate cancer    Frequent headaches    GERD (gastroesophageal reflux disease)    History of kidney stones    Hypertension    Migraines    Pre-diabetes     Past Surgical History:  Procedure Laterality Date   BREAST BIOPSY Right 07/13/2019   stereo biopsy/ x clip/path pending   BREAST BIOPSY Right 07/13/2019   stereo biopsy/coil clip/ path pending   BREAST RECONSTRUCTION WITH PLACEMENT OF TISSUE EXPANDER AND FLEX HD (ACELLULAR HYDRATED DERMIS) Bilateral 08/27/2019   Procedure: BILATERAL BREAST RECONSTRUCTION WITH PLACEMENT OF TISSUE EXPANDER AND FLEX HD (ACELLULAR HYDRATED DERMIS);  Surgeon: Wallace Going, DO;  Location: [facility];  Service: Plastics;  Laterality: Bilateral;   [place], 2004, 2013   COLONOSCOPY  2015   CYSTOSCOPY W/ RETROGRADES Right 09/13/2018   Procedure: CYSTOSCOPY WITH RETROGRADE PYELOGRAM;  Surgeon: Abbie Sons, MD;  Location: [facility];  Service: Urology;   Laterality: Right;   CYSTOSCOPY/URETEROSCOPY/HOLMIUM LASER/STENT PLACEMENT Left 05/09/2018   Procedure: CYSTOSCOPY/URETEROSCOPY/HOLMIUM LASER/STENT PLACEMENT;  Surgeon: Abbie Sons, MD;  Location: [facility];  Service: Urology;  Laterality: Left;   CYSTOSCOPY/URETEROSCOPY/HOLMIUM LASER/STENT PLACEMENT Right 09/13/2018   Procedure: CYSTOSCOPY/URETEROSCOPY/HOLMIUM LASER/STENT PLACEMENT;  Surgeon: Abbie Sons, MD;  Location: [facility];  Service: Urology;  Laterality: Right;   INCONTINENCE SURGERY     BLADDER TUCK   LIPOSUCTION WITH LIPOFILLING Bilateral 05/05/2020   Procedure: LIPOSUCTION WITH LIPOFILLING; LIPOSUCTION OF EXCESS LATERAL BREAST TISSUE;  Surgeon: Wallace Going, DO;  Location: [place];  Service: Plastics;  Laterality: Bilateral;  90 min   PILONIDAL CYST EXCISION     REMOVAL OF BILATERAL TISSUE EXPANDERS WITH PLACEMENT OF BILATERAL BREAST IMPLANTS Bilateral 01/02/2020   Procedure: REMOVAL OF BILATERAL TISSUE EXPANDERS WITH PLACEMENT OF BILATERAL BREAST IMPLANTS;  Surgeon: Wallace Going, DO;  Location: [place];  Service: Plastics;  Laterality: Bilateral;  2 hours, please   STONE EXTRACTION WITH BASKET Right 09/13/2018   Procedure: STONE EXTRACTION WITH BASKET;  Surgeon: Abbie Sons, MD;  Location: [facility];  Service: Urology;  Laterality: Right;   TOTAL MASTECTOMY Bilateral 08/27/2019   Procedure: TOTAL MASTECTOMY;  Surgeon: Herbert Pun, MD;  Location: [facility];  Service: General;  Laterality: Bilateral;  START @930   DUE TO SENTINEL NODE   UPPER GI ENDOSCOPY      There were no vitals filed for this visit.   Subjective Assessment - 08/06/20 1708     Subjective Pt reports no pain today. Compliance with HEP. Pt reports she is having clunking/popping sensation with all overhead motion. Has not scheduled shots yet.    Pertinent History Pt is a 55 year old female s/p L proximal humerus fracture 12/02/10. PMH double masectomy  08/27/19 with expander placement and reconstruction finished 01/03/20.Pt fell in [place] walking straight forward onto her shoulder. Patient reports fracture was non-displaced and she did not have surgery, by Dr. Adrian Prince advisement, wore sling 6 weeks. Currently does not know orthopedic expectations for weight restrictions/movement resistrictions, but has not been lifting more than 1/2 gallon of milk, but is no longer wearing sling. 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 Patient reports motion is very restricted and she is unable to don/doff clothes, her husband washes her hair for her, unable to push/pull, or lift anything. Pt is an avid golfer, and walks often. Worst shoulder pain over the past week 8/10 best 0/10 but reports she avoids painful movement.  Pt is R hand dominant. Pt denies N/V, B&B changes, unexplained weight fluctuation, saddle paresthesia, fever, night sweats, or unrelenting night pain at this time.    Limitations Lifting;House hold activities;Writing    How long can you sit comfortably? unlimited    How long can you stand comfortably? unlimited    How long can you walk comfortably? unlimited    Diagnostic tests Xrays [facility] 12/03/19    Patient Stated Goals Full ROM to complete self care    Pain Onset More than a month ago              Ther-Ex TRX flex/abd walkouts x12  Total gym "pull ups" L16 3x 6 with 10sec hang after last rep in each set   Push up position on very elevated mat table alt scaption lifts 2x 12 with cuing for L scapular stability with RUE lift; popping that becomes painful in second set  Seated DB chest press 3# 2x 6 with min cuing for technique with good carry over following      Manual Therapy  STM with trigger point release  to latissimus and pec minor PROM all directions, clunking pop with overhead flex so ceased                           PT Education - 08/06/20  1710     Education Details therex form/technique    Person(s) Educated Patient    Methods Explanation;Demonstration;Verbal cues    Comprehension Verbalized understanding;Verbal cues required;Returned demonstration              PT Short Term Goals - 07/17/20 1730       PT SHORT TERM GOAL #1   Title Pt will be independent with HEP in order to improve strength and decrease pain in order to improve pain-free function at home and work.    Baseline 01/23/20 HEP given; 05/21/20 completing prior to surgery, HEP updated today; 07/18/20 Completing HEP 50% of the time    Time 4    Period Weeks    Status Revised      PT SHORT TERM GOAL #2   Title Pt will demonstrate full PROM to demonstrate joint mobility needed for for full AROM  Baseline 05/21/20 flex: 143 abd: 105 ER: 54 IR: 72:  07/17/20 flex 154d abd 106 ER 72d IR 76d    Time 4    Period Weeks    Status Revised               PT Long Term Goals - 07/17/20 1706       PT LONG TERM GOAL #1   Title Pt will demonstrate full active L shoulder ROM in order to complete self care and overhead ADLS    Baseline 25/36/64 flex 39d abd 39d  ER L ear IR L PSIS; 05/21/20 flex 116d abd 81d  ER C7 IR T12; 07/17/20 flex 134d abd 92d  ER C7 IR T12    Time 8    Period Weeks    Status On-going      PT LONG TERM GOAL #2   Title Pt will decrease worst pain as reported on NPRS by at least 3 points in order to demonstrate clinically significant reduction in pain.    Baseline 01/25/20 8/10; 05/21/20 7/10; 07/17/20 4/10    Time 8    Period Weeks    Status Achieved      PT LONG TERM GOAL #3   Title Pt will demonstrate L gross shoulder strength of 5/5 in order to demonstrate PLOF and strength needed for heavy household tasks    Baseline 01/23/20 flex 2+ abd 2+ ER 2+ IR 2+; 05/21/20 In availible range: flex: 4+ abd: 4- ER: 4+ IR: 4+; 07/17/20 In availible range: flex: 4+ abd: 4- ER: 4+ IR: 4+    Time 8    Period Weeks    Status On-going      PT LONG TERM  GOAL #4   Title Patient will increase FOTO score to 79 to demonstrate predicted increase in functional mobility to complete ADLs    Baseline 07/17/20    Time 8    Period Weeks    Status New                   Plan - 08/06/20 1715     Clinical Impression Statement PT continued therex progression with overhead mobility and strength with success. Patient is able to complete active assisted overhead pull with assisted pull up, but when completing active overhead motion has clunking sensation at shoulder (exercise ceased). With PROM humeral head almost feels dislocating, with relocation on return from overhead. PT contacted MD Mack Guise for further advisement. PT will continue progression as able.    Personal Factors and Comorbidities Comorbidity 1;Comorbidity 2;Comorbidity 3+;Past/Current Experience;Fitness;Time since onset of injury/illness/exacerbation    Comorbidities breast cancer, HTN, GERD, anxiety    Examination-Activity Limitations Bathing;Lift;Transfers;Reach Overhead;Carry;Dressing    Examination-Participation Restrictions Yard Work;Cleaning;Community Activity;Driving;Occupation;Laundry    Stability/Clinical Decision Making Evolving/Moderate complexity    Clinical Decision Making Moderate    Rehab Potential Good    PT Frequency 2x / week    PT Duration 8 weeks    PT Treatment/Interventions ADLs/Self Care Home Management;Electrical Stimulation;Therapeutic exercise;Balance training;Joint Manipulations;Taping;Spinal Manipulations;Cryotherapy;Iontophoresis 7m/ml Dexamethasone;DME Instruction;Neuromuscular re-education;Gait training;Stair training;Functional mobility training;Moist Heat;Traction;Ultrasound;Therapeutic activities;Patient/family education;Manual techniques;Dry needling;Passive range of motion;Vestibular    PT Next Visit Plan Manual therapy: MWM, Low load long duration stretching, and joint mobilizations    PT Home Exercise Plan see 04/07/20 note    Consulted and Agree  with Plan of Care Patient             Patient will benefit from skilled therapeutic intervention in order to improve the following deficits  and impairments:  Decreased balance, Impaired flexibility, Impaired UE functional use, Hypomobility, Decreased strength, Decreased range of motion, Decreased endurance, Decreased activity tolerance, Postural dysfunction, Increased muscle spasms, Hypermobility, Decreased mobility, Decreased coordination, Abnormal gait, Pain, Improper body mechanics, Increased fascial restricitons  Visit Diagnosis: Stiffness of left shoulder, not elsewhere classified  Acute pain of left shoulder     Problem List Patient Active Problem List   Diagnosis Date Noted   Breast asymmetry following reconstructive surgery 03/25/2020   S/P breast reconstruction, bilateral 03/06/2020   Sebaceous cyst 12/31/2019   Breast wound 10/05/2019   Acquired absence of bilateral breasts and nipples 09/04/2019   Genetic testing 37/94/4461   Monoallelic mutation of CHEK2 gene in female patient 08/07/2019   Malignant neoplasm of upper-outer quadrant of right breast in female, estrogen receptor positive ([place]) 07/30/2019   Goals of care, counseling/discussion 07/30/2019   Family history of prostate cancer    Family history of colon cancer    Breast cancer ([place]) 07/25/2019   Left lower quadrant pain 05/23/2018   Personal history of kidney stones 04/20/2018   Microscopic hematuria 04/20/2018   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 [place], [address] Phone: [phone]   Fax:  [phone]  Name: Kristel Durkee MRN: [id number] Date of Birth: April 29, 1965

## 2020-08-12 ENCOUNTER — Ambulatory Visit: Payer: BC Managed Care – PPO | Attending: Oncology | Admitting: Physical Therapy

## 2020-08-12 DIAGNOSIS — M25612 Stiffness of left shoulder, not elsewhere classified: Secondary | ICD-10-CM | POA: Insufficient documentation

## 2020-08-12 DIAGNOSIS — M25512 Pain in left shoulder: Secondary | ICD-10-CM | POA: Insufficient documentation

## 2020-08-13 ENCOUNTER — Ambulatory Visit: Payer: BC Managed Care – PPO | Admitting: Physical Therapy

## 2020-08-18 ENCOUNTER — Inpatient Hospital Stay: Payer: BC Managed Care – PPO | Admitting: Oncology

## 2020-08-25 ENCOUNTER — Ambulatory Visit: Payer: BC Managed Care – PPO | Admitting: Physical Therapy

## 2020-08-27 ENCOUNTER — Encounter: Payer: BC Managed Care – PPO | Admitting: Physical Therapy

## 2020-08-28 ENCOUNTER — Ambulatory Visit: Payer: BC Managed Care – PPO | Admitting: Physical Therapy

## 2020-08-28 ENCOUNTER — Other Ambulatory Visit: Payer: Self-pay

## 2020-08-28 ENCOUNTER — Encounter: Payer: Self-pay | Admitting: Physical Therapy

## 2020-08-28 DIAGNOSIS — M25512 Pain in left shoulder: Secondary | ICD-10-CM | POA: Diagnosis present

## 2020-08-28 DIAGNOSIS — M25612 Stiffness of left shoulder, not elsewhere classified: Secondary | ICD-10-CM | POA: Diagnosis not present

## 2020-08-28 NOTE — Patient Instructions (Signed)
Patient will use vaseline/xeroform & gauze dressings until sites are completely healed. She understands that colors may fade up to 30%- which could require touch-up treatments in the future. She will call for any concerns or questions

## 2020-08-28 NOTE — Progress Notes (Signed)
07/24/20 NIPPLE AREOLAR TATTOO PROCEDURE  PREOPERATIVE DIAGNOSIS:  Acquired absence of BILATERAL nipple areolar   POSTOPERATIVE DIAGNOSIS: Acquired absence of BILATERAL nipple areolar    PROCEDURES: BILATERAL nipple areolar tattoo   ATTENDING SURGEON: Dr. Lyndee Leo Dillingham  ANESTHESIA:  EMLA  COMPLICATIONS: None.  JUSTIFICATION FOR PROCEDURE:  Tiffany Acevedo is a 55 y.o. female with a history of right breast cancer status post bilateral breast reconstruction. The patient presents for bilateral nipple areolar complex tattoo. Risks, benefits, indications, and alternatives of the above described procedures were discussed with the patient and all the patient's questions were answered.   DESCRIPTION OF PROCEDURE: After informed consent was obtained and proper identification of patient and surgical site was made, the patient was taken to the procedure room and pre-procedure photos were taken & entered into chart. Placement/size/shape & colors were chosen by patient & myself. The patient was then placed supine on the operating room table. A time out was performed to confirm patient's identity and surgical site. The patient was prepped and draped in the usual sterile fashion.   Using a # 7 pronged [employer] tattoo head, pigment was instilled to the designed bilateral nipple areolar complexes.  Once adequate pigment had been applied  a post-procedure photos taken. Vaseline/  xeroform & gauze dressing was applied.  The patient tolerated the procedure well.  Aftercare instructions were reviewed with pt.  World Famous Tattoo Ink used: Cytogeneticist Tut / Josph Macho Skin / Cool Mink / [place]/ [place] / Fair [place] Ultra Duration- used as needed for topical anesthesia Lot #'s & exp dates on file

## 2020-08-28 NOTE — Therapy (Signed)
[facility] [address] [place], [address] Phone: [phone]   Fax:  [phone]  Physical Therapy Treatment  Patient Details  Name: Tiffany Acevedo MRN: [id number] Date of Birth: [birth year] No data recorded  Encounter Date: 08/28/2020   PT End of Session - 08/28/20 1618     Visit Number 33    Number of Visits 15    Date for PT Re-Evaluation 09/12/20    Authorization Time Period no limit    Authorization - Visit Number 45    Authorization - Number of Visits 100    PT Start Time 6286    PT Stop Time 3817    PT Time Calculation (min) 38 min    Activity Tolerance Patient tolerated treatment well    Behavior During Therapy [facility] for tasks assessed/performed             Past Medical History:  Diagnosis Date   Allergy    Anxiety    Breast cancer ([place])    Depression    Excessive weight gain 06/30/2016   Family history of colon cancer    Family history of colon cancer    Family history of prostate cancer    Frequent headaches    GERD (gastroesophageal reflux disease)    History of kidney stones    Hypertension    Migraines    Pre-diabetes     Past Surgical History:  Procedure Laterality Date   BREAST BIOPSY Right 07/13/2019   stereo biopsy/ x clip/path pending   BREAST BIOPSY Right 07/13/2019   stereo biopsy/coil clip/ path pending   BREAST RECONSTRUCTION WITH PLACEMENT OF TISSUE EXPANDER AND FLEX HD (ACELLULAR HYDRATED DERMIS) Bilateral 08/27/2019   Procedure: BILATERAL BREAST RECONSTRUCTION WITH PLACEMENT OF TISSUE EXPANDER AND FLEX HD (ACELLULAR HYDRATED DERMIS);  Surgeon: Wallace Going, DO;  Location: [facility];  Service: Plastics;  Laterality: Bilateral;   [place], 2004, 2013   COLONOSCOPY  2015   CYSTOSCOPY W/ RETROGRADES Right 09/13/2018   Procedure: CYSTOSCOPY WITH RETROGRADE PYELOGRAM;  Surgeon: Abbie Sons, MD;  Location: [facility];  Service: Urology;   Laterality: Right;   CYSTOSCOPY/URETEROSCOPY/HOLMIUM LASER/STENT PLACEMENT Left 05/09/2018   Procedure: CYSTOSCOPY/URETEROSCOPY/HOLMIUM LASER/STENT PLACEMENT;  Surgeon: Abbie Sons, MD;  Location: [facility];  Service: Urology;  Laterality: Left;   CYSTOSCOPY/URETEROSCOPY/HOLMIUM LASER/STENT PLACEMENT Right 09/13/2018   Procedure: CYSTOSCOPY/URETEROSCOPY/HOLMIUM LASER/STENT PLACEMENT;  Surgeon: Abbie Sons, MD;  Location: [facility];  Service: Urology;  Laterality: Right;   INCONTINENCE SURGERY     BLADDER TUCK   LIPOSUCTION WITH LIPOFILLING Bilateral 05/05/2020   Procedure: LIPOSUCTION WITH LIPOFILLING; LIPOSUCTION OF EXCESS LATERAL BREAST TISSUE;  Surgeon: Wallace Going, DO;  Location: [place];  Service: Plastics;  Laterality: Bilateral;  90 min   PILONIDAL CYST EXCISION     REMOVAL OF BILATERAL TISSUE EXPANDERS WITH PLACEMENT OF BILATERAL BREAST IMPLANTS Bilateral 01/02/2020   Procedure: REMOVAL OF BILATERAL TISSUE EXPANDERS WITH PLACEMENT OF BILATERAL BREAST IMPLANTS;  Surgeon: Wallace Going, DO;  Location: [facility];  Service: Plastics;  Laterality: Bilateral;  2 hours, please   STONE EXTRACTION WITH BASKET Right 09/13/2018   Procedure: STONE EXTRACTION WITH BASKET;  Surgeon: Abbie Sons, MD;  Location: [facility];  Service: Urology;  Laterality: Right;   TOTAL MASTECTOMY Bilateral 08/27/2019   Procedure: TOTAL MASTECTOMY;  Surgeon: Herbert Pun, MD;  Location: [facility];  Service: General;  Laterality: Bilateral;  START @930   DUE TO SENTINEL NODE   UPPER GI ENDOSCOPY      There were no vitals filed for this visit.   Subjective Assessment - 08/28/20 1612     Subjective Pt reports she is getting clunk sensation that is painful while it happens, when lifting her arm in any direction. Patient reports compliance with HEP with wall walks and pulleys especially, more pain with strengthening activities.    Pertinent History Pt is a 55  year old female s/p L proximal humerus fracture 12/02/10. PMH double masectomy 08/27/19 with expander placement and reconstruction finished 01/03/20.Pt fell in [place] walking straight forward onto her shoulder. Patient reports fracture was non-displaced and she did not have surgery, by Dr. Adrian Prince advisement, wore sling 6 weeks. Currently does not know orthopedic expectations for weight restrictions/movement resistrictions, but has not been lifting more than 1/2 gallon of milk, but is no longer wearing sling. 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 Patient reports motion is very restricted and she is unable to don/doff clothes, her husband washes her hair for her, unable to push/pull, or lift anything. Pt is an avid golfer, and walks often. Worst shoulder pain over the past week 8/10 best 0/10 but reports she avoids painful movement.  Pt is R hand dominant. Pt denies N/V, B&B changes, unexplained weight fluctuation, saddle paresthesia, fever, night sweats, or unrelenting night pain at this time.    Limitations Lifting;House hold activities;Writing    How long can you sit comfortably? unlimited    How long can you stand comfortably? unlimited    How long can you walk comfortably? unlimited    Diagnostic tests Xrays [facility] 12/03/19    Patient Stated Goals Full ROM to complete self care    Pain Onset More than a month ago              Ther-Ex TRX flex/abd walkouts x12   Total gym "pull ups" L16 3x 6 with 10sec hang after last rep in each set   From mat table: Push up plus x10; from bosu ball x10 with good carry over of demo   From mat table bosu ball circles 5x clockwise 5x counterclockwise; x3 rounds cuing to prevent hip compensation       Manual Therapy  STM with trigger point release  to latissimus and pec minor PROM all directions, clunking pop with overhead flex so ceased in  overhead                         PT Education - 08/28/20 1614     Education Details therex form/technique    Person(s) Educated Patient    Methods Explanation;Demonstration;Verbal cues    Comprehension Verbalized understanding;Returned demonstration;Verbal cues required              PT Short Term Goals - 07/17/20 1730       PT SHORT TERM GOAL #1   Title Pt will be independent with HEP in order to improve strength and decrease pain in order to improve pain-free function at home and work.    Baseline 01/23/20 HEP given; 05/21/20 completing prior to surgery, HEP updated today; 07/18/20 Completing HEP 50% of the time    Time 4    Period Weeks    Status Revised      PT SHORT TERM GOAL #2   Title Pt will demonstrate full PROM to demonstrate joint mobility needed for for full  AROM    Baseline 05/21/20 flex: 143 abd: 105 ER: 54 IR: 72:  07/17/20 flex 154d abd 106 ER 72d IR 76d    Time 4    Period Weeks    Status Revised               PT Long Term Goals - 07/17/20 1706       PT LONG TERM GOAL #1   Title Pt will demonstrate full active L shoulder ROM in order to complete self care and overhead ADLS    Baseline 94/49/67 flex 39d abd 39d  ER L ear IR L PSIS; 05/21/20 flex 116d abd 81d  ER C7 IR T12; 07/17/20 flex 134d abd 92d  ER C7 IR T12    Time 8    Period Weeks    Status On-going      PT LONG TERM GOAL #2   Title Pt will decrease worst pain as reported on NPRS by at least 3 points in order to demonstrate clinically significant reduction in pain.    Baseline 01/25/20 8/10; 05/21/20 7/10; 07/17/20 4/10    Time 8    Period Weeks    Status Achieved      PT LONG TERM GOAL #3   Title Pt will demonstrate L gross shoulder strength of 5/5 in order to demonstrate PLOF and strength needed for heavy household tasks    Baseline 01/23/20 flex 2+ abd 2+ ER 2+ IR 2+; 05/21/20 In availible range: flex: 4+ abd: 4- ER: 4+ IR: 4+; 07/17/20 In availible range: flex: 4+ abd: 4-  ER: 4+ IR: 4+    Time 8    Period Weeks    Status On-going      PT LONG TERM GOAL #4   Title Patient will increase FOTO score to 79 to demonstrate predicted increase in functional mobility to complete ADLs    Baseline 07/17/20    Time 8    Period Weeks    Status New                   Plan - 08/29/20 5916     Clinical Impression Statement PT continued therex progression with focus on closed chain therex without overhead motion to prevent aggravation. No pain or popping/clunking with choosen therex, patient able to complete with proper technique with good motivation throughout session. With overhead motion (passive and active) patient with ant popping of what feels like long head of bicep, with a posterior clunk that mimics a posterior dislocation. PT continues to advise appt with Dr. Mack Guise as this does not resemble the normal mechanics of the shoulder. PT will continue progression as able.    Personal Factors and Comorbidities Comorbidity 1;Comorbidity 2;Past/Current Experience;Fitness;Time since onset of injury/illness/exacerbation    Comorbidities breast cancer, HTN, GERD, anxiety    Examination-Activity Limitations Bathing;Lift;Transfers;Reach Overhead;Carry;Dressing    Examination-Participation Restrictions Yard Work;Cleaning;Community Activity;Driving;Occupation;Laundry    Stability/Clinical Decision Making Evolving/Moderate complexity    Clinical Decision Making Moderate    Rehab Potential Good    PT Frequency 2x / week    PT Duration 8 weeks    PT Treatment/Interventions ADLs/Self Care Home Management;Electrical Stimulation;Therapeutic exercise;Balance training;Joint Manipulations;Taping;Spinal Manipulations;Cryotherapy;Iontophoresis 75m/ml Dexamethasone;DME Instruction;Neuromuscular re-education;Gait training;Stair training;Functional mobility training;Moist Heat;Traction;Ultrasound;Therapeutic activities;Patient/family education;Manual techniques;Dry needling;Passive  range of motion;Vestibular    PT Next Visit Plan Manual therapy: MWM, Low load long duration stretching, and joint mobilizations    PT Home Exercise Plan see 04/07/20 note    Consulted and Agree with Plan of Care  Patient             Patient will benefit from skilled therapeutic intervention in order to improve the following deficits and impairments:  Decreased balance, Impaired flexibility, Impaired UE functional use, Hypomobility, Decreased strength, Decreased range of motion, Decreased endurance, Decreased activity tolerance, Postural dysfunction, Increased muscle spasms, Hypermobility, Decreased mobility, Decreased coordination, Abnormal gait, Pain, Improper body mechanics, Increased fascial restricitons  Visit Diagnosis: Stiffness of left shoulder, not elsewhere classified  Acute pain of left shoulder     Problem List Patient Active Problem List   Diagnosis Date Noted   Breast asymmetry following reconstructive surgery 03/25/2020   S/P breast reconstruction, bilateral 03/06/2020   Sebaceous cyst 12/31/2019   Breast wound 10/05/2019   Acquired absence of bilateral breasts and nipples 09/04/2019   Genetic testing 11/56/7164   Monoallelic mutation of CHEK2 gene in female patient 08/07/2019   Malignant neoplasm of upper-outer quadrant of right breast in female, estrogen receptor positive ([place]) 07/30/2019   Goals of care, counseling/discussion 07/30/2019   Family history of prostate cancer    Family history of colon cancer    Breast cancer ([place]) 07/25/2019   Left lower quadrant pain 05/23/2018   Personal history of kidney stones 04/20/2018   Microscopic hematuria 04/20/2018   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, [place], [address] Phone: [phone]   Fax:  [phone]  Name: Tiffany Acevedo MRN: [id number] Date of Birth: May 01, 1965

## 2020-09-01 ENCOUNTER — Encounter: Payer: Self-pay | Admitting: Physical Therapy

## 2020-09-01 ENCOUNTER — Inpatient Hospital Stay: Payer: BC Managed Care – PPO | Admitting: Oncology

## 2020-09-01 ENCOUNTER — Ambulatory Visit: Payer: BC Managed Care – PPO | Admitting: Physical Therapy

## 2020-09-01 DIAGNOSIS — M25612 Stiffness of left shoulder, not elsewhere classified: Secondary | ICD-10-CM

## 2020-09-01 DIAGNOSIS — M25512 Pain in left shoulder: Secondary | ICD-10-CM

## 2020-09-01 NOTE — Therapy (Signed)
[facility] [address] [place], [address] Phone: [phone]   Fax:  [phone]  Physical Therapy Treatment  Patient Details  Name: Tiffany Acevedo MRN: [id number] Date of Birth: 1965/06/10 No data recorded  Encounter Date: 09/01/2020   PT End of Session - 09/01/20 0954     Visit Number 34    Number of Visits 50    Date for PT Re-Evaluation 09/12/20    Authorization Time Period no limit    Authorization - Visit Number 70    Authorization - Number of Visits 100    PT Start Time 0951    PT Stop Time 1030    PT Time Calculation (min) 39 min    Activity Tolerance Patient tolerated treatment well    Behavior During Therapy [facility] for tasks assessed/performed             Past Medical History:  Diagnosis Date   Allergy    Anxiety    Breast cancer ([place])    Depression    Excessive weight gain 06/30/2016   Family history of colon cancer    Family history of colon cancer    Family history of prostate cancer    Frequent headaches    GERD (gastroesophageal reflux disease)    History of kidney stones    Hypertension    Migraines    Pre-diabetes     Past Surgical History:  Procedure Laterality Date   BREAST BIOPSY Right 07/13/2019   stereo biopsy/ x clip/path pending   BREAST BIOPSY Right 07/13/2019   stereo biopsy/coil clip/ path pending   BREAST RECONSTRUCTION WITH PLACEMENT OF TISSUE EXPANDER AND FLEX HD (ACELLULAR HYDRATED DERMIS) Bilateral 08/27/2019   Procedure: BILATERAL BREAST RECONSTRUCTION WITH PLACEMENT OF TISSUE EXPANDER AND FLEX HD (ACELLULAR HYDRATED DERMIS);  Surgeon: Wallace Going, DO;  Location: [facility];  Service: Plastics;  Laterality: Bilateral;   [place], 2004, 2013   COLONOSCOPY  2015   CYSTOSCOPY W/ RETROGRADES Right 09/13/2018   Procedure: CYSTOSCOPY WITH RETROGRADE PYELOGRAM;  Surgeon: Abbie Sons, MD;  Location: [facility];  Service: Urology;   Laterality: Right;   CYSTOSCOPY/URETEROSCOPY/HOLMIUM LASER/STENT PLACEMENT Left 05/09/2018   Procedure: CYSTOSCOPY/URETEROSCOPY/HOLMIUM LASER/STENT PLACEMENT;  Surgeon: Abbie Sons, MD;  Location: [facility];  Service: Urology;  Laterality: Left;   CYSTOSCOPY/URETEROSCOPY/HOLMIUM LASER/STENT PLACEMENT Right 09/13/2018   Procedure: CYSTOSCOPY/URETEROSCOPY/HOLMIUM LASER/STENT PLACEMENT;  Surgeon: Abbie Sons, MD;  Location: [facility];  Service: Urology;  Laterality: Right;   INCONTINENCE SURGERY     BLADDER TUCK   LIPOSUCTION WITH LIPOFILLING Bilateral 05/05/2020   Procedure: LIPOSUCTION WITH LIPOFILLING; LIPOSUCTION OF EXCESS LATERAL BREAST TISSUE;  Surgeon: Wallace Going, DO;  Location: [place];  Service: Plastics;  Laterality: Bilateral;  90 min   PILONIDAL CYST EXCISION     REMOVAL OF BILATERAL TISSUE EXPANDERS WITH PLACEMENT OF BILATERAL BREAST IMPLANTS Bilateral 01/02/2020   Procedure: REMOVAL OF BILATERAL TISSUE EXPANDERS WITH PLACEMENT OF BILATERAL BREAST IMPLANTS;  Surgeon: Wallace Going, DO;  Location: [facility];  Service: Plastics;  Laterality: Bilateral;  2 hours, please   STONE EXTRACTION WITH BASKET Right 09/13/2018   Procedure: STONE EXTRACTION WITH BASKET;  Surgeon: Abbie Sons, MD;  Location: [facility];  Service: Urology;  Laterality: Right;   TOTAL MASTECTOMY Bilateral 08/27/2019   Procedure: TOTAL MASTECTOMY;  Surgeon: Herbert Pun, MD;  Location: [facility];  Service: General;  Laterality: Bilateral;  START @930   DUE TO SENTINEL NODE   UPPER GI ENDOSCOPY      There were no vitals filed for this visit.   Subjective Assessment - 09/01/20 1004     Subjective Patient reports less clunking since last visit, but still present on lowering arm from overhead position. Compliance with HEP without questions/concerns. Still trying to get an appt with Dr. Harden Mo office    Pertinent History Pt is a 55 year old female s/p L  proximal humerus fracture 12/02/10. PMH double masectomy 08/27/19 with expander placement and reconstruction finished 01/03/20.Pt fell in [place] walking straight forward onto her shoulder. Patient reports fracture was non-displaced and she did not have surgery, by Dr. Adrian Prince advisement, wore sling 6 weeks. Currently does not know orthopedic expectations for weight restrictions/movement resistrictions, but has not been lifting more than 1/2 gallon of milk, but is no longer wearing sling. 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 Patient reports motion is very restricted and she is unable to don/doff clothes, her husband washes her hair for her, unable to push/pull, or lift anything. Pt is an avid golfer, and walks often. Worst shoulder pain over the past week 8/10 best 0/10 but reports she avoids painful movement.  Pt is R hand dominant. Pt denies N/V, B&B changes, unexplained weight fluctuation, saddle paresthesia, fever, night sweats, or unrelenting night pain at this time.    Limitations Lifting;House hold activities;Writing    How long can you sit comfortably? unlimited    How long can you stand comfortably? unlimited    How long can you walk comfortably? unlimited    Diagnostic tests Xrays [facility] 12/03/19    Patient Stated Goals Full ROM to complete self care    Pain Onset More than a month ago             Ther-Ex TRX flex/abd walkouts x12   Total gym "pull ups" L17 2x 5; L16 x6 with 10sec hang after last rep in each set  Face pull/rear delt pull 10# 3x 8 with cuing for abd at 90d with good carry over   From mat table with bosu ball (harside) x10 with good carry over of demo; 3# DB passes        Manual Therapy  STM with trigger point release  to latissimus and pec minor PROM all directions, clunking pop with overhead flex so ceased in overhead                    PT Education - 09/01/20 0953     Education  Details therex form/technique    Person(s) Educated Patient    Methods Explanation;Demonstration;Verbal cues    Comprehension Verbalized understanding;Returned demonstration;Verbal cues required              PT Short Term Goals - 07/17/20 1730       PT SHORT TERM GOAL #1   Title Pt will be independent with HEP in order to improve strength and decrease pain in order to improve pain-free function at home and work.    Baseline 01/23/20 HEP given; 05/21/20 completing prior to surgery, HEP updated today; 07/18/20 Completing HEP 50% of the time    Time 4    Period Weeks    Status Revised      PT SHORT TERM GOAL #2   Title Pt will demonstrate full PROM to demonstrate joint mobility needed for for full AROM    Baseline 05/21/20 flex: 143 abd:  105 ER: 54 IR: 72:  07/17/20 flex 154d abd 106 ER 72d IR 76d    Time 4    Period Weeks    Status Revised               PT Long Term Goals - 07/17/20 1706       PT LONG TERM GOAL #1   Title Pt will demonstrate full active L shoulder ROM in order to complete self care and overhead ADLS    Baseline 41/96/22 flex 39d abd 39d  ER L ear IR L PSIS; 05/21/20 flex 116d abd 81d  ER C7 IR T12; 07/17/20 flex 134d abd 92d  ER C7 IR T12    Time 8    Period Weeks    Status On-going      PT LONG TERM GOAL #2   Title Pt will decrease worst pain as reported on NPRS by at least 3 points in order to demonstrate clinically significant reduction in pain.    Baseline 01/25/20 8/10; 05/21/20 7/10; 07/17/20 4/10    Time 8    Period Weeks    Status Achieved      PT LONG TERM GOAL #3   Title Pt will demonstrate L gross shoulder strength of 5/5 in order to demonstrate PLOF and strength needed for heavy household tasks    Baseline 01/23/20 flex 2+ abd 2+ ER 2+ IR 2+; 05/21/20 In availible range: flex: 4+ abd: 4- ER: 4+ IR: 4+; 07/17/20 In availible range: flex: 4+ abd: 4- ER: 4+ IR: 4+    Time 8    Period Weeks    Status On-going      PT LONG TERM GOAL #4   Title  Patient will increase FOTO score to 79 to demonstrate predicted increase in functional mobility to complete ADLs    Baseline 07/17/20    Time 8    Period Weeks    Status New                   Plan - 09/01/20 1011     Clinical Impression Statement PT continued therex progression for posterior scapulo-humeral strengthening, attempting to avoid motions with clunking sensation. PT assisted patient in trying to get in touch with Dr. Harden Mo office for follow up for her shoulder. Paiten tis able to comply with all cuing for proper technique of all therex with good motivation throughout session. PT will conitnue progression as able.    Personal Factors and Comorbidities Comorbidity 1;Comorbidity 2;Past/Current Experience;Fitness;Time since onset of injury/illness/exacerbation    Comorbidities breast cancer, HTN, GERD, anxiety    Examination-Activity Limitations Bathing;Lift;Transfers;Reach Overhead;Carry;Dressing    Examination-Participation Restrictions Yard Work;Cleaning;Community Activity;Driving;Occupation;Laundry    Stability/Clinical Decision Making Evolving/Moderate complexity    Clinical Decision Making Moderate    Rehab Potential Good    PT Frequency 2x / week    PT Duration 8 weeks    PT Treatment/Interventions ADLs/Self Care Home Management;Electrical Stimulation;Therapeutic exercise;Balance training;Joint Manipulations;Taping;Spinal Manipulations;Cryotherapy;Iontophoresis 56m/ml Dexamethasone;DME Instruction;Neuromuscular re-education;Gait training;Stair training;Functional mobility training;Moist Heat;Traction;Ultrasound;Therapeutic activities;Patient/family education;Manual techniques;Dry needling;Passive range of motion;Vestibular    PT Next Visit Plan Manual therapy: MWM, Low load long duration stretching, and joint mobilizations    PT Home Exercise Plan see 04/07/20 note    Consulted and Agree with Plan of Care Patient             Patient will benefit from skilled  therapeutic intervention in order to improve the following deficits and impairments:  Decreased balance, Impaired flexibility, Impaired UE functional  use, Hypomobility, Decreased strength, Decreased range of motion, Decreased endurance, Decreased activity tolerance, Postural dysfunction, Increased muscle spasms, Hypermobility, Decreased mobility, Decreased coordination, Abnormal gait, Pain, Improper body mechanics, Increased fascial restricitons  Visit Diagnosis: Stiffness of left shoulder, not elsewhere classified  Acute pain of left shoulder     Problem List Patient Active Problem List   Diagnosis Date Noted   Breast asymmetry following reconstructive surgery 03/25/2020   S/P breast reconstruction, bilateral 03/06/2020   Sebaceous cyst 12/31/2019   Breast wound 10/05/2019   Acquired absence of bilateral breasts and nipples 09/04/2019   Genetic testing 88/89/1694   Monoallelic mutation of CHEK2 gene in female patient 08/07/2019   Malignant neoplasm of upper-outer quadrant of right breast in female, estrogen receptor positive ([place]) 07/30/2019   Goals of care, counseling/discussion 07/30/2019   Family history of prostate cancer    Family history of colon cancer    Breast cancer ([place]) 07/25/2019   Left lower quadrant pain 05/23/2018   Personal history of kidney stones 04/20/2018   Microscopic hematuria 04/20/2018   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 [place], [address] Phone: [phone]   Fax:  [phone]  Name: Tiffany Acevedo MRN: [id number] Date of Birth: [birth year]

## 2020-09-03 ENCOUNTER — Ambulatory Visit (INDEPENDENT_AMBULATORY_CARE_PROVIDER_SITE_OTHER): Payer: BC Managed Care – PPO

## 2020-09-03 ENCOUNTER — Ambulatory Visit: Payer: BC Managed Care – PPO | Admitting: Physical Therapy

## 2020-09-03 ENCOUNTER — Other Ambulatory Visit: Payer: Self-pay

## 2020-09-03 VITALS — BP 138/82 | HR 86 | Temp 98.4°F

## 2020-09-03 DIAGNOSIS — Z9013 Acquired absence of bilateral breasts and nipples: Secondary | ICD-10-CM

## 2020-09-08 ENCOUNTER — Encounter: Payer: Self-pay | Admitting: Physical Therapy

## 2020-09-08 ENCOUNTER — Ambulatory Visit: Payer: BC Managed Care – PPO | Attending: Oncology | Admitting: Physical Therapy

## 2020-09-08 DIAGNOSIS — M25612 Stiffness of left shoulder, not elsewhere classified: Secondary | ICD-10-CM | POA: Diagnosis present

## 2020-09-08 DIAGNOSIS — M25512 Pain in left shoulder: Secondary | ICD-10-CM | POA: Insufficient documentation

## 2020-09-08 NOTE — Therapy (Signed)
[facility] [address] [place], [address] Phone: [phone]   Fax:  [phone]  Physical Therapy Treatment  Patient Details  Name: Tiffany Acevedo MRN: [id number] Date of Birth: 06/06/1965 No data recorded  Encounter Date: 09/08/2020   PT End of Session - 09/08/20 1647     Visit Number 35    Number of Visits 50    Date for PT Re-Evaluation 09/12/20    Authorization Time Period no limit    Authorization - Visit Number 58    Authorization - Number of Visits 100    PT Start Time 7619    PT Stop Time 5093    PT Time Calculation (min) 39 min    Activity Tolerance Patient tolerated treatment well    Behavior During Therapy [facility] for tasks assessed/performed             Past Medical History:  Diagnosis Date   Allergy    Anxiety    Breast cancer ([place])    Depression    Excessive weight gain 06/30/2016   Family history of colon cancer    Family history of colon cancer    Family history of prostate cancer    Frequent headaches    GERD (gastroesophageal reflux disease)    History of kidney stones    Hypertension    Migraines    Pre-diabetes     Past Surgical History:  Procedure Laterality Date   BREAST BIOPSY Right 07/13/2019   stereo biopsy/ x clip/path pending   BREAST BIOPSY Right 07/13/2019   stereo biopsy/coil clip/ path pending   BREAST RECONSTRUCTION WITH PLACEMENT OF TISSUE EXPANDER AND FLEX HD (ACELLULAR HYDRATED DERMIS) Bilateral 08/27/2019   Procedure: BILATERAL BREAST RECONSTRUCTION WITH PLACEMENT OF TISSUE EXPANDER AND FLEX HD (ACELLULAR HYDRATED DERMIS);  Surgeon: Wallace Going, DO;  Location: [facility];  Service: Plastics;  Laterality: Bilateral;   [place], 2004, 2013   COLONOSCOPY  2015   CYSTOSCOPY W/ RETROGRADES Right 09/13/2018   Procedure: CYSTOSCOPY WITH RETROGRADE PYELOGRAM;  Surgeon: Abbie Sons, MD;  Location: [facility];  Service: Urology;   Laterality: Right;   CYSTOSCOPY/URETEROSCOPY/HOLMIUM LASER/STENT PLACEMENT Left 05/09/2018   Procedure: CYSTOSCOPY/URETEROSCOPY/HOLMIUM LASER/STENT PLACEMENT;  Surgeon: Abbie Sons, MD;  Location: [facility];  Service: Urology;  Laterality: Left;   CYSTOSCOPY/URETEROSCOPY/HOLMIUM LASER/STENT PLACEMENT Right 09/13/2018   Procedure: CYSTOSCOPY/URETEROSCOPY/HOLMIUM LASER/STENT PLACEMENT;  Surgeon: Abbie Sons, MD;  Location: [facility];  Service: Urology;  Laterality: Right;   INCONTINENCE SURGERY     BLADDER TUCK   LIPOSUCTION WITH LIPOFILLING Bilateral 05/05/2020   Procedure: LIPOSUCTION WITH LIPOFILLING; LIPOSUCTION OF EXCESS LATERAL BREAST TISSUE;  Surgeon: Wallace Going, DO;  Location: [place];  Service: Plastics;  Laterality: Bilateral;  90 min   PILONIDAL CYST EXCISION     REMOVAL OF BILATERAL TISSUE EXPANDERS WITH PLACEMENT OF BILATERAL BREAST IMPLANTS Bilateral 01/02/2020   Procedure: REMOVAL OF BILATERAL TISSUE EXPANDERS WITH PLACEMENT OF BILATERAL BREAST IMPLANTS;  Surgeon: Wallace Going, DO;  Location: [facility];  Service: Plastics;  Laterality: Bilateral;  2 hours, please   STONE EXTRACTION WITH BASKET Right 09/13/2018   Procedure: STONE EXTRACTION WITH BASKET;  Surgeon: Abbie Sons, MD;  Location: [facility];  Service: Urology;  Laterality: Right;   TOTAL MASTECTOMY Bilateral 08/27/2019   Procedure: TOTAL MASTECTOMY;  Surgeon: Herbert Pun, MD;  Location: [facility];  Service: General;  Laterality: Bilateral;  START _0   DUE TO SENTINEL NODE   UPPER GI ENDOSCOPY      There were no vitals filed for this visit.   Subjective Assessment - 09/08/20 1608     Subjective Pt reports being able to play golf this past Saturday without pain in L shoulder and without having to altering her swing. She reports no pain in L shoulder other than moving outside of her available ROM. She reports receiving steroid injection in L shoulder for  inflammation.    Pertinent History Pt is a 55 year old female s/p L proximal humerus fracture 12/02/10. PMH double masectomy 08/27/19 with expander placement and reconstruction finished 01/03/20.Pt fell in [place] walking straight forward onto her shoulder. Patient reports fracture was non-displaced and she did not have surgery, by Dr. Adrian Prince advisement, wore sling 6 weeks. Currently does not know orthopedic expectations for weight restrictions/movement resistrictions, but has not been lifting more than 1/2 gallon of milk, but is no longer wearing sling. 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 Patient reports motion is very restricted and she is unable to don/doff clothes, her husband washes her hair for her, unable to push/pull, or lift anything. Pt is an avid golfer, and walks often. Worst shoulder pain over the past week 8/10 best 0/10 but reports she avoids painful movement.  Pt is R hand dominant. Pt denies N/V, B&B changes, unexplained weight fluctuation, saddle paresthesia, fever, night sweats, or unrelenting night pain at this time.    Limitations Lifting;House hold activities;Writing    How long can you sit comfortably? unlimited    How long can you stand comfortably? unlimited    How long can you walk comfortably? unlimited    Diagnostic tests Xrays [facility] 12/03/19    Patient Stated Goals Full ROM to complete self care              Therapeutic  TRX AAROM  walk out flexion/abd x 20  Total gym pull ups L18 1 x 5 reps with PT assist on last 2; L 17 1 x 5 reps; L16 1 x 5 reps with 15 sec hang following last rep.  Standing lat pull down (straight arm) 1 x 10 reps #15; 2 x 10 reps #10 with emphasis on squeezing periscapular muscles with 2-3 sec  Bent over row supinated grip 2 x 10 reps with emphasis on scapular retraction  Modified plyo-push up on bosu ball 1 x 5 reps L<>R for increased strength and WB  tolerance            PT Education - 09/08/20 1646     Education Details Pt educated on importance of continuing to work in increased motion to maintain.    Person(s) Educated Patient    Methods Explanation    Comprehension Verbalized understanding              PT Short Term Goals - 07/17/20 1730       PT SHORT TERM GOAL #1   Title Pt will be independent with HEP in order to improve strength and decrease pain in order to improve pain-free function at home and work.    Baseline 01/23/20 HEP given; 05/21/20 completing prior to surgery, HEP updated today; 07/18/20 Completing HEP 50% of the time    Time 4    Period Weeks    Status Revised      PT SHORT TERM GOAL #2   Title Pt will demonstrate full PROM to demonstrate joint  mobility needed for for full AROM    Baseline 05/21/20 flex: 143 abd: 105 ER: 54 IR: 72:  07/17/20 flex 154d abd 106 ER 72d IR 76d    Time 4    Period Weeks    Status Revised               PT Long Term Goals - 07/17/20 1706       PT LONG TERM GOAL #1   Title Pt will demonstrate full active L shoulder ROM in order to complete self care and overhead ADLS    Baseline 13/08/65 flex 39d abd 39d  ER L ear IR L PSIS; 05/21/20 flex 116d abd 81d  ER C7 IR T12; 07/17/20 flex 134d abd 92d  ER C7 IR T12    Time 8    Period Weeks    Status On-going      PT LONG TERM GOAL #2   Title Pt will decrease worst pain as reported on NPRS by at least 3 points in order to demonstrate clinically significant reduction in pain.    Baseline 01/25/20 8/10; 05/21/20 7/10; 07/17/20 4/10    Time 8    Period Weeks    Status Achieved      PT LONG TERM GOAL #3   Title Pt will demonstrate L gross shoulder strength of 5/5 in order to demonstrate PLOF and strength needed for heavy household tasks    Baseline 01/23/20 flex 2+ abd 2+ ER 2+ IR 2+; 05/21/20 In availible range: flex: 4+ abd: 4- ER: 4+ IR: 4+; 07/17/20 In availible range: flex: 4+ abd: 4- ER: 4+ IR: 4+    Time 8    Period  Weeks    Status On-going      PT LONG TERM GOAL #4   Title Patient will increase FOTO score to 79 to demonstrate predicted increase in functional mobility to complete ADLs    Baseline 07/17/20    Time 8    Period Weeks    Status New                   Plan - 09/08/20 1648     Clinical Impression Statement Patient tolerated session well evidenced by no reported increase in pain following following therapeutic exercise. PT continued L shoulder mobility and strengthening progression with overhead and WB activities. Patient will continue to benefit from skilled PT to ensure safe and effective exercise progression to achieve set goals.    Personal Factors and Comorbidities Comorbidity 1;Comorbidity 2;Past/Current Experience;Fitness;Time since onset of injury/illness/exacerbation    Comorbidities breast cancer, HTN, GERD, anxiety    Examination-Activity Limitations Bathing;Lift;Transfers;Reach Overhead;Carry;Dressing    Examination-Participation Restrictions Yard Work;Cleaning;Community Activity;Driving;Occupation;Laundry    Stability/Clinical Decision Making Evolving/Moderate complexity    Clinical Decision Making Moderate    Rehab Potential Good    PT Frequency 2x / week    PT Duration 8 weeks    PT Treatment/Interventions ADLs/Self Care Home Management;Electrical Stimulation;Therapeutic exercise;Balance training;Joint Manipulations;Taping;Spinal Manipulations;Cryotherapy;Iontophoresis 64m/ml Dexamethasone;DME Instruction;Neuromuscular re-education;Gait training;Stair training;Functional mobility training;Moist Heat;Traction;Ultrasound;Therapeutic activities;Patient/family education;Manual techniques;Dry needling;Passive range of motion;Vestibular    PT Next Visit Plan Manual therapy: MWM, Low load long duration stretching, and joint mobilizations    PT Home Exercise Plan see 04/07/20 note    Consulted and Agree with Plan of Care Patient             Patient will benefit from  skilled therapeutic intervention in order to improve the following deficits and impairments:  Decreased balance, Impaired flexibility, Impaired  UE functional use, Hypomobility, Decreased strength, Decreased range of motion, Decreased endurance, Decreased activity tolerance, Postural dysfunction, Increased muscle spasms, Hypermobility, Decreased mobility, Decreased coordination, Abnormal gait, Pain, Improper body mechanics, Increased fascial restricitons  Visit Diagnosis: Stiffness of left shoulder, not elsewhere classified     Problem List Patient Active Problem List   Diagnosis Date Noted   Breast asymmetry following reconstructive surgery 03/25/2020   S/P breast reconstruction, bilateral 03/06/2020   Sebaceous cyst 12/31/2019   Breast wound 10/05/2019   Acquired absence of bilateral breasts and nipples 09/04/2019   Genetic testing 12/05/9020   Monoallelic mutation of CHEK2 gene in female patient 08/07/2019   Malignant neoplasm of upper-outer quadrant of right breast in female, estrogen receptor positive ([place]) 07/30/2019   Goals of care, counseling/discussion 07/30/2019   Family history of prostate cancer    Family history of colon cancer    Breast cancer ([place]) 07/25/2019   Left lower quadrant pain 05/23/2018   Personal history of kidney stones 04/20/2018   Microscopic hematuria 04/20/2018   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  [place], [address] Phone: [phone]   Fax:  [phone]  Name: Aniya Jolicoeur MRN: [id number] Date of Birth: [birth year]

## 2020-09-09 NOTE — Patient Instructions (Signed)
Patient will use vaseline/xeroform & gauze dressing until healed. She is aware that d/t fading- she may need future touch-ups. Call for any concerns

## 2020-09-09 NOTE — Progress Notes (Signed)
09/03/20 NIPPLE AREOLAR TATTOO PROCEDURE  PREOPERATIVE DIAGNOSIS:  Acquired absence of bilateral  nipple areolar   POSTOPERATIVE DIAGNOSIS: Acquired absence of bilateral nipple areolar    PROCEDURES:  bilateral nipple areolar tattoo touch-up  ATTENDING SURGEON: Dr. Lyndee Leo [place]  ANESTHESIA:  EMLA  COMPLICATIONS: None.  JUSTIFICATION FOR PROCEDURE:  Tiffany Acevedo is a 55 y.o. female with a history of right breast cancer status post bilateral breast reconstruction. The patient presents for nipple areolar complex tattoo touch-up.  Risks, benefits, indications, and alternatives of the above described procedures were discussed with the patient and all the patient's questions were answered.   DESCRIPTION OF PROCEDURE: After informed consent was obtained and proper identification of patient and surgical site was made, the patient was taken to the procedure room and pre-procedure photos were taken. Patient was then placed supine on the operating room table. A time out was performed to confirm patient's identity and surgical site. The patient was prepped and draped in the usual sterile fashion.  .Using a # 7 pronged tattoo head, pigment was instilled to the designed nipple areolar complexes. Once adequate pigment had been applied to the nipple areolar complex, a photo was taken. Vaseline/xeroform / gauze dressing was applied. The patient tolerated the procedure well.   World Famous Tattoo Ink used: Electrical engineer / [employer] / [employer]a [employer] Skin / [place] / Cool Tressa Busman /Tan Mink Duration used as needed for topical anesthesia Lot #'s & exp dates on file

## 2020-09-15 ENCOUNTER — Encounter: Payer: Self-pay | Admitting: Physical Therapy

## 2020-09-15 ENCOUNTER — Inpatient Hospital Stay: Payer: BC Managed Care – PPO | Attending: Oncology | Admitting: Oncology

## 2020-09-15 ENCOUNTER — Ambulatory Visit: Payer: BC Managed Care – PPO | Admitting: Physical Therapy

## 2020-09-15 VITALS — BP 146/76 | HR 91 | Temp 98.5°F | Resp 20 | Wt 160.0 lb

## 2020-09-15 DIAGNOSIS — D0511 Intraductal carcinoma in situ of right breast: Secondary | ICD-10-CM

## 2020-09-15 DIAGNOSIS — C50411 Malignant neoplasm of upper-outer quadrant of right female breast: Secondary | ICD-10-CM | POA: Insufficient documentation

## 2020-09-15 DIAGNOSIS — Z5181 Encounter for therapeutic drug level monitoring: Secondary | ICD-10-CM | POA: Diagnosis not present

## 2020-09-15 DIAGNOSIS — Z79810 Long term (current) use of selective estrogen receptor modulators (SERMs): Secondary | ICD-10-CM

## 2020-09-15 DIAGNOSIS — M25612 Stiffness of left shoulder, not elsewhere classified: Secondary | ICD-10-CM | POA: Diagnosis not present

## 2020-09-15 DIAGNOSIS — M25512 Pain in left shoulder: Secondary | ICD-10-CM

## 2020-09-15 DIAGNOSIS — Z170 Estrogen receptor positive status [ER+]: Secondary | ICD-10-CM | POA: Diagnosis not present

## 2020-09-15 DIAGNOSIS — N951 Menopausal and female climacteric states: Secondary | ICD-10-CM | POA: Insufficient documentation

## 2020-09-15 NOTE — Progress Notes (Signed)
Survivorship Care Plan visit completed.  Treatment summary reviewed and given to patient.  ASCO answers booklet reviewed and given to patient.  CARE program and Cancer Transitions discussed with patient along with other resources cancer center offers to patients and caregivers.  Patient verbalized understanding.    

## 2020-09-15 NOTE — Therapy (Signed)
[facility] [address], [place], [address] Phone: [phone]   Fax:  [phone]  Physical Therapy Treatment  Patient Details  Name: Tiffany Acevedo MRN: [id number] Date of Birth: May 05, 1965 No data recorded  Encounter Date: 09/15/2020   PT End of Session - 09/15/20 1627     Visit Number 36    Number of Visits 50    Date for PT Re-Evaluation 11/14/20    Authorization - Visit Number 67    Authorization - Number of Visits 100    PT Start Time 6568    PT Stop Time 1648    PT Time Calculation (min) 38 min    Activity Tolerance Patient tolerated treatment well    Behavior During Therapy [facility] for tasks assessed/performed             Past Medical History:  Diagnosis Date   Allergy    Anxiety    Breast cancer ([place])    Depression    Excessive weight gain 06/30/2016   Family history of colon cancer    Family history of colon cancer    Family history of prostate cancer    Frequent headaches    GERD (gastroesophageal reflux disease)    History of kidney stones    Hypertension    Migraines    Pre-diabetes     Past Surgical History:  Procedure Laterality Date   BREAST BIOPSY Right 07/13/2019   stereo biopsy/ x clip/path pending   BREAST BIOPSY Right 07/13/2019   stereo biopsy/coil clip/ path pending   BREAST RECONSTRUCTION WITH PLACEMENT OF TISSUE EXPANDER AND FLEX HD (ACELLULAR HYDRATED DERMIS) Bilateral 08/27/2019   Procedure: BILATERAL BREAST RECONSTRUCTION WITH PLACEMENT OF TISSUE EXPANDER AND FLEX HD (ACELLULAR HYDRATED DERMIS);  Surgeon: Wallace Going, DO;  Location: [facility];  Service: Plastics;  Laterality: Bilateral;   [place], 2004, 2013   COLONOSCOPY  2015   CYSTOSCOPY W/ RETROGRADES Right 09/13/2018   Procedure: CYSTOSCOPY WITH RETROGRADE PYELOGRAM;  Surgeon: Abbie Sons, MD;  Location: [facility];  Service: Urology;  Laterality: Right;    CYSTOSCOPY/URETEROSCOPY/HOLMIUM LASER/STENT PLACEMENT Left 05/09/2018   Procedure: CYSTOSCOPY/URETEROSCOPY/HOLMIUM LASER/STENT PLACEMENT;  Surgeon: Abbie Sons, MD;  Location: [facility];  Service: Urology;  Laterality: Left;   CYSTOSCOPY/URETEROSCOPY/HOLMIUM LASER/STENT PLACEMENT Right 09/13/2018   Procedure: CYSTOSCOPY/URETEROSCOPY/HOLMIUM LASER/STENT PLACEMENT;  Surgeon: Abbie Sons, MD;  Location: [facility];  Service: Urology;  Laterality: Right;   INCONTINENCE SURGERY     BLADDER TUCK   LIPOSUCTION WITH LIPOFILLING Bilateral 05/05/2020   Procedure: LIPOSUCTION WITH LIPOFILLING; LIPOSUCTION OF EXCESS LATERAL BREAST TISSUE;  Surgeon: Wallace Going, DO;  Location: [place] [facility];  Service: Plastics;  Laterality: Bilateral;  90 min   PILONIDAL CYST EXCISION     REMOVAL OF BILATERAL TISSUE EXPANDERS WITH PLACEMENT OF BILATERAL BREAST IMPLANTS Bilateral 01/02/2020   Procedure: REMOVAL OF BILATERAL TISSUE EXPANDERS WITH PLACEMENT OF BILATERAL BREAST IMPLANTS;  Surgeon: Wallace Going, DO;  Location: [facility];  Service: Plastics;  Laterality: Bilateral;  2 hours, please   STONE EXTRACTION WITH BASKET Right 09/13/2018   Procedure: STONE EXTRACTION WITH BASKET;  Surgeon: Abbie Sons, MD;  Location: [facility];  Service: Urology;  Laterality: Right;   TOTAL MASTECTOMY Bilateral 08/27/2019   Procedure: TOTAL MASTECTOMY;  Surgeon: Herbert Pun, MD;  Location: [facility];  Service: General;  Laterality: Bilateral;  START @930  DUE TO SENTINEL NODE   UPPER GI  ENDOSCOPY      There were no vitals filed for this visit.   Subjective Assessment - 09/15/20 1613     Subjective Pt reports she thinks her motion is better since shot. She is still having some clunking/popping that is still painful.    Pertinent History Pt is a 55 year old female s/p L proximal humerus fracture 12/02/10. PMH double masectomy 08/27/19 with expander placement and reconstruction  finished 01/03/20.Pt fell in [place] walking straight forward onto her shoulder. Patient reports fracture was non-displaced and she did not have surgery, by Dr. Adrian Prince advisement, wore sling 6 weeks. Currently does not know orthopedic expectations for weight restrictions/movement resistrictions, but has not been lifting more than 1/2 gallon of milk, but is no longer wearing sling. 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 Patient reports motion is very restricted and she is unable to don/doff clothes, her husband washes her hair for her, unable to push/pull, or lift anything. Pt is an avid golfer, and walks often. Worst shoulder pain over the past week 8/10 best 0/10 but reports she avoids painful movement.  Pt is R hand dominant. Pt denies N/V, B&B changes, unexplained weight fluctuation, saddle paresthesia, fever, night sweats, or unrelenting night pain at this time.    Limitations Lifting;House hold activities;Writing    How long can you sit comfortably? unlimited    How long can you stand comfortably? unlimited    How long can you walk comfortably? unlimited    Diagnostic tests Xrays [facility] 12/03/19    Patient Stated Goals Full ROM to complete self care    Pain Onset More than a month ago             Therapeutic   TRX AAROM  walk out flexion/abd x 20   Total gym pull ups L17 2x 5 with 10sec hang after each set   Supine lat pullover 7# AW on PVC pipe 2x 8 with cuing for scapulohumeral rhythm with scap retraction    PT reviewed the following HEP with patient with patient able to demonstrate a set of the following with min cuing for correction needed. PT educated patient on parameters of therex (how/when to inc/decrease intensity, frequency, rep/set range, stretch hold time, and purpose of therex) with verbalized understanding.   Access Code: [email or web address] Standing Shoulder Abduction Finger Walk at Wall - 3 x daily - 7 x weekly - 12-20  reps Standing Shoulder Abduction Finger Walk at Wall - 3 x daily - 7 x weekly - 12-20 reps Low Trap Setting at [place] - 1 x daily - 1-2 x weekly - 3 sets - 10 reps Standing Shoulder Scaption - 1 x daily - 1-2 x weekly - 3 sets - 10 reps Prone Shoulder Row - 1 x daily - 1-2 x weekly - 3 sets - 10 reps                           PT Education - 09/15/20 1626     Education Details therex form/technique; HEP update    Person(s) Educated Patient    Methods Explanation;Demonstration;Verbal cues    Comprehension Verbalized understanding;Returned demonstration;Verbal cues required              PT Short Term Goals - 09/15/20 1615       PT SHORT TERM GOAL #1   Title Pt will be independent with HEP in order to improve strength  and decrease pain in order to improve pain-free function at home and work.    Baseline 01/23/20 HEP given; 05/21/20 completing prior to surgery, HEP updated today; 07/18/20 Completing HEP 50% of the time 09/15/20 completing 50% of the time    Time 4    Period Weeks    Status On-going      PT SHORT TERM GOAL #2   Title Pt will demonstrate full PROM to demonstrate joint mobility needed for for full AROM    Baseline 05/21/20 flex: 143 abd: 105 ER: 54 IR: 72:  07/17/20 flex 154d abd 106 ER 72d IR 76d; 09/15/20 163d; abd 130d ER 82d IR 87d    Time 4    Period Weeks    Status Partially Met               PT Long Term Goals - 09/15/20 1631       PT LONG TERM GOAL #1   Title Pt will demonstrate full active L shoulder ROM in order to complete self care and overhead ADLS    Baseline 37/36/68 flex 39d abd 39d  ER L ear IR L PSIS; 05/21/20 flex 116d abd 81d  ER C7 IR T12; 07/17/20 flex 134d abd 92d  ER C7 IR T12; 8/8/2 164d; abd 153d; ER CTJ; IR T12    Time 8    Period Weeks    Status Partially Met      PT LONG TERM GOAL #2   Title Pt will decrease worst pain as reported on NPRS by at least 3 points in order to demonstrate clinically significant reduction  in pain.    Baseline 01/25/20 8/10; 05/21/20 7/10; 07/17/20 4/10    Time 8    Period Weeks    Status Achieved      PT LONG TERM GOAL #3   Title Pt will demonstrate L gross shoulder strength of 5/5 in order to demonstrate PLOF and strength needed for heavy household tasks    Baseline 01/23/20 flex 2+ abd 2+ ER 2+ IR 2+; 05/21/20 In availible range: flex: 4+ abd: 4- ER: 4+ IR: 4+; 07/17/20 In availible range: flex: 4+ abd: 4- ER: 4+ IR: 4+; 09/15/20 5/5 gross    Time 8    Period Weeks    Status Achieved      PT LONG TERM GOAL #4   Title Patient will increase FOTO score to 79 to demonstrate predicted increase in functional mobility to complete ADLs    Baseline 07/17/20    Time 8      PT LONG TERM GOAL #5   Title Pt will demonstrate 4+ L lower trap strength in order to complete heavy overhead tasks    Baseline 09/15/20 3/5    Time 8    Period Weeks    Status New                   Plan - 09/15/20 1654     Clinical Impression Statement PT reassessed patient this visit where patient has made significant progress toward goals since last re-asssessment. Patient with impairments still in final 20d of overhead mobility, strength of scapular moveers (specifically lower traps), and scapulohumeral rhythm. PT updated HEP to reflect current progress and impairments, which patient is able to demonstrate and verbalize understanding of. PT will continue therex progression as able.    Personal Factors and Comorbidities Comorbidity 1;Comorbidity 2;Past/Current Experience;Fitness;Time since onset of injury/illness/exacerbation    Comorbidities breast cancer, HTN, GERD, anxiety  Examination-Activity Limitations Bathing;Lift;Transfers;Reach Overhead;Carry;Dressing    Examination-Participation Restrictions Yard Work;Cleaning;Community Activity;Driving;Occupation;Laundry    Stability/Clinical Decision Making Evolving/Moderate complexity    Clinical Decision Making Moderate    Rehab Potential Good    PT  Frequency 2x / week    PT Duration 8 weeks    PT Treatment/Interventions ADLs/Self Care Home Management;Electrical Stimulation;Therapeutic exercise;Balance training;Joint Manipulations;Taping;Spinal Manipulations;Cryotherapy;Iontophoresis 66m/ml Dexamethasone;DME Instruction;Neuromuscular re-education;Gait training;Stair training;Functional mobility training;Moist Heat;Traction;Ultrasound;Therapeutic activities;Patient/family education;Manual techniques;Dry needling;Passive range of motion;Vestibular    PT Next Visit Plan Manual therapy: MWM, Low load long duration stretching, and joint mobilizations    PT Home Exercise Plan see 04/07/20 note    Consulted and Agree with Plan of Care Patient             Patient will benefit from skilled therapeutic intervention in order to improve the following deficits and impairments:  Decreased balance, Impaired flexibility, Impaired UE functional use, Hypomobility, Decreased strength, Decreased range of motion, Decreased endurance, Decreased activity tolerance, Postural dysfunction, Increased muscle spasms, Hypermobility, Decreased mobility, Decreased coordination, Abnormal gait, Pain, Improper body mechanics, Increased fascial restricitons  Visit Diagnosis: Stiffness of left shoulder, not elsewhere classified  Acute pain of left shoulder     Problem List Patient Active Problem List   Diagnosis Date Noted   Breast asymmetry following reconstructive surgery 03/25/2020   S/P breast reconstruction, bilateral 03/06/2020   Sebaceous cyst 12/31/2019   Closed fracture of proximal end of left humerus 12/03/2019   Broken humerus 12/02/2019   Breast wound 10/05/2019   Acquired absence of bilateral breasts and nipples 09/04/2019   Genetic testing 047/10/6281  Monoallelic mutation of CHEK2 gene in female patient 08/07/2019   Malignant neoplasm of upper-outer quadrant of right breast in female, estrogen receptor positive [place] 07/30/2019   Goals of care,  counseling/discussion 07/30/2019   Family history of prostate cancer    Family history of colon cancer    [place]ast cancer ([place] 07/25/2019   Left lower quadrant pain 05/23/2018   Personal history of kidney stones 04/20/2018   Microscopic hematuria 04/20/2018   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 [place] [address]Phone: [phone]  Fax:  [phone] Name: THattie PineMRN: [id number] of Birth: 1Aug 26, 1967

## 2020-09-16 ENCOUNTER — Encounter: Payer: Self-pay | Admitting: Oncology

## 2020-09-16 NOTE — Progress Notes (Signed)
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  This was followed by diagnostic mammogram which showed numerous groups of calcifications spanning at least 6.4 cm.  She had a biopsy of the medialmost and lateralmost calcification.  The medialmost calcification showed invasive mammary carcinoma grade 1 ER/PR positive and HER-2 negative.  The lateralmost calcification was negative for atypia and malignancy and showed fibrocystic changes.  Patient has met with Dr. Peyton Najjar and is leaning towards at least a unilateral mastectomy.  She also underwent bilateral MRI which did not show any enhancement even at the site of biopsy-proven malignancy or elsewhere.  No MRI evidence of malignancy in the left breast.  Patient has not had any menstrual cycle since May 2021 right before her surgery.   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  3 sentinel lymph nodes  negative for malignancy.  No evidence of malignancy or DCIS in the left breast.  Interval history-patient is currently on tamoxifen which she is tolerating well for the most part other than hot flashes which can be sometimes annoying for her and affecting her quality of life.  She is not currently on any medications for the same.  Denies other side effects from tamoxifen.  She has not had any menstrual cycle since May 2021.  ECOG PS- 0 Pain scale- 0   Review of systems- Review of Systems  Constitutional:  Negative for chills, fever, malaise/fatigue and weight loss.  HENT:  Negative for congestion, ear discharge and nosebleeds.   Eyes:  Negative for blurred vision.  Respiratory:  Negative for cough, hemoptysis, sputum production, shortness of breath and wheezing.   Cardiovascular:  Negative for chest pain, palpitations, orthopnea and claudication.  Gastrointestinal:  Negative for abdominal pain, blood in stool, constipation, diarrhea, heartburn, melena, nausea and vomiting.  Genitourinary:  Negative for dysuria, flank pain, frequency, hematuria and urgency.  Musculoskeletal:  Negative for back pain, joint pain and myalgias.  Skin:  Negative for rash.  Neurological:  Negative for dizziness, tingling, focal weakness, seizures, weakness and headaches.  Endo/Heme/Allergies:  Does not bruise/bleed easily.       Hot flashes  Psychiatric/Behavioral:  Negative for depression and suicidal ideas. The patient does not have insomnia.      Allergies  Allergen Reactions   Oxycodone-Acetaminophen    Percocet [Oxycodone-Acetaminophen] Other (See Comments)    Arms and legs go numb   Sulfa Antibiotics Nausea And Vomiting     Past Medical History:  Diagnosis Date   Allergy    Anxiety    Breast cancer ([place])    Depression  Excessive weight gain 06/30/2016   Family history of colon cancer    Family history of colon cancer    Family history of prostate cancer    Frequent headaches    GERD  (gastroesophageal reflux disease)    History of kidney stones    Hypertension    Migraines    Pre-diabetes      Past Surgical History:  Procedure Laterality Date   BREAST BIOPSY Right 07/13/2019   stereo biopsy/ x clip/path pending   BREAST BIOPSY Right 07/13/2019   stereo biopsy/coil clip/ path pending   BREAST RECONSTRUCTION WITH PLACEMENT OF TISSUE EXPANDER AND FLEX HD (ACELLULAR HYDRATED DERMIS) Bilateral 08/27/2019   Procedure: BILATERAL BREAST RECONSTRUCTION WITH PLACEMENT OF TISSUE EXPANDER AND FLEX HD (ACELLULAR HYDRATED DERMIS);  Surgeon: Wallace Going, DO;  Location: [facility];  Service: Plastics;  Laterality: Bilateral;   [place], 2004, 2013   COLONOSCOPY  2015   CYSTOSCOPY W/ RETROGRADES Right 09/13/2018   Procedure: CYSTOSCOPY WITH RETROGRADE PYELOGRAM;  Surgeon: Abbie Sons, MD;  Location: [facility];  Service: Urology;  Laterality: Right;   CYSTOSCOPY/URETEROSCOPY/HOLMIUM LASER/STENT PLACEMENT Left 05/09/2018   Procedure: CYSTOSCOPY/URETEROSCOPY/HOLMIUM LASER/STENT PLACEMENT;  Surgeon: Abbie Sons, MD;  Location: [facility];  Service: Urology;  Laterality: Left;   CYSTOSCOPY/URETEROSCOPY/HOLMIUM LASER/STENT PLACEMENT Right 09/13/2018   Procedure: CYSTOSCOPY/URETEROSCOPY/HOLMIUM LASER/STENT PLACEMENT;  Surgeon: Abbie Sons, MD;  Location: [facility];  Service: Urology;  Laterality: Right;   INCONTINENCE SURGERY     BLADDER TUCK   LIPOSUCTION WITH LIPOFILLING Bilateral 05/05/2020   Procedure: LIPOSUCTION WITH LIPOFILLING; LIPOSUCTION OF EXCESS LATERAL BREAST TISSUE;  Surgeon: Wallace Going, DO;  Location: [place];  Service: Plastics;  Laterality: Bilateral;  90 min   PILONIDAL CYST EXCISION     REMOVAL OF BILATERAL TISSUE EXPANDERS WITH PLACEMENT OF BILATERAL BREAST IMPLANTS Bilateral 01/02/2020   Procedure: REMOVAL OF BILATERAL TISSUE EXPANDERS WITH PLACEMENT OF BILATERAL BREAST IMPLANTS;  Surgeon: Wallace Going, DO;   Location: [place];  Service: Plastics;  Laterality: Bilateral;  2 hours, please   STONE EXTRACTION WITH BASKET Right 09/13/2018   Procedure: STONE EXTRACTION WITH BASKET;  Surgeon: Abbie Sons, MD;  Location: [facility];  Service: Urology;  Laterality: Right;   TOTAL MASTECTOMY Bilateral 08/27/2019   Procedure: TOTAL MASTECTOMY;  Surgeon: Herbert Pun, MD;  Location: [facility];  Service: General;  Laterality: Bilateral;  START _0  DUE TO SENTINEL NODE   UPPER GI ENDOSCOPY      Social History   Socioeconomic History   Marital status: Married    Spouse name: Not on file   Number of children: Not on file   Years of education: Not on file   Highest education level: Not on file  Occupational History   Not on file  Tobacco Use   Smoking status: Never   Smokeless tobacco: Never  Vaping Use   Vaping Use: Never used  Substance and Sexual Activity   Alcohol use: Yes    Comment: glass of wine occassionally   Drug use: No   Sexual activity: Yes    Partners: Male  Other Topics Concern   Not on file  Social History Narrative   Not on file   Social Determinants of Health   Financial Resource Strain: Not on file  Food Insecurity: Not on file  Transportation Needs: Not on file  Physical Activity: Not on file  Stress: Not on file  Social Connections: Not on file  Intimate  Partner Violence: Not on file    Family History  Problem Relation Age of Onset   Hyperlipidemia Mother    Hypertension Mother    Depression Mother    Colon cancer Mother 69   Alcohol abuse Father    Hyperlipidemia Father    Stroke Father    Hypertension Father    Depression Father    Heart disease Father    Prostate cancer Father 53   Arthritis Maternal Grandmother    Hypertension Maternal Grandmother    Depression Maternal Grandmother    Arthritis Maternal Grandfather    Stroke Maternal Grandfather    Hypertension Maternal Grandfather    Depression Maternal Grandfather     Hypertension Paternal Grandmother    Depression Paternal Grandmother    Hypertension Paternal Grandfather    Depression Paternal Grandfather    Leukemia Maternal Aunt        d <50   Cancer Paternal Aunt        unsure types     Current Outpatient Medications:    Ascorbic Acid (VITAMIN C) 100 MG tablet, Take 500 mg by mouth daily., Disp: , Rfl:    Calcium Carbonate-Vit D-Min (CALCIUM 1200 PO), Take 1 tablet by mouth daily. , Disp: , Rfl:    Fremanezumab-vfrm (AJOVY) 225 MG/1.5ML SOAJ, Inject into the skin., Disp: , Rfl:    hydrochlorothiazide (MICROZIDE) 12.5 MG capsule, Take 1 capsule (12.5 mg total) by mouth daily., Disp: 30 capsule, Rfl: 2   ibuprofen (ADVIL,MOTRIN) 200 MG tablet, Take 400 mg by mouth every 6 (six) hours as needed for headache or moderate pain. , Disp: , Rfl:    Ibuprofen-diphenhydrAMINE Cit (MOTRIN PM PO), Take 1 tablet by mouth at bedtime., Disp: , Rfl:    imipramine (TOFRANIL) 50 MG tablet, Take 50 mg by mouth at bedtime. , Disp: , Rfl:    metoprolol succinate (TOPROL-XL) 100 MG 24 hr tablet, Take 25 mg by mouth daily., Disp: , Rfl:    Multiple Vitamin (MULTIVITAMIN PO), Take 1 tablet by mouth daily., Disp: , Rfl:    omeprazole (PRILOSEC) 20 MG capsule, Take 20 mg by mouth daily. , Disp: , Rfl:    rizatriptan (MAXALT) 10 MG tablet, Take 10 mg by mouth as needed for migraine. , Disp: , Rfl:    tamoxifen (NOLVADEX) 20 MG tablet, TAKE 1 TABLET BY MOUTH DAILY., Disp: 30 tablet, Rfl: 2   venlafaxine XR (EFFEXOR-XR) 150 MG 24 hr capsule, TAKE 1 CAPSULE BY MOUTH EVERY DAY WITH BREAKFAST (Patient taking differently: Take 150 mg by mouth daily with breakfast.), Disp: 30 capsule, Rfl: 5   Zinc 22.5 MG TABS, Take 1 tablet by mouth daily., Disp: , Rfl:   Physical exam:  Vitals:   09/15/20 1411  BP: (!) 146/76  Pulse: 91  Resp: 20  Temp: 98.5 F (36.9 C)  TempSrc: Tympanic  SpO2: 100%  Weight: 160 lb (72.6 kg)   Physical Exam Constitutional:      General: She is not  in acute distress. Cardiovascular:     Rate and Rhythm: Normal rate and regular rhythm.     Heart sounds: Normal heart sounds.  Pulmonary:     Effort: Pulmonary effort is normal.     Breath sounds: Normal breath sounds.  Skin:    General: Skin is warm and dry.  Neurological:     Mental Status: She is alert and oriented to person, place, and time.  Patient is s/p bilateral mastectomy with reconstruction.  No evidence of chest  wall recurrence.  No palpable bilateral axillary adenopathy.   CMP Latest Ref Rng & Units 05/05/2020  Glucose 70 - 99 mg/dL 109(H)  BUN 6 - 20 mg/dL 14  Creatinine 0.44 - 1.00 mg/dL 0.86  Sodium 135 - 145 mmol/L 141  Potassium 3.5 - 5.1 mmol/L 3.8  Chloride 98 - 111 mmol/L 106  CO2 22 - 32 mmol/L 27  Calcium 8.9 - 10.3 mg/dL 9.3  Total Protein 6.5 - 8.1 g/dL -  Total Bilirubin 0.3 - 1.2 mg/dL -  Alkaline Phos 38 - 126 U/L -  AST 15 - 41 U/L -  ALT 0 - 44 U/L -   CBC Latest Ref Rng & Units 09/14/2018  WBC 4.0 - 10.5 K/uL 17.7(H)  Hemoglobin 12.0 - 15.0 g/dL 12.8  Hematocrit 36.0 - 46.0 % 38.9  Platelets 150 - 400 K/uL 257     Assessment and plan- Patient is a 55 y.o. female with CHEK2 mutation and pathological prognostic stage Ia invasive mammary carcinoma of the right breast ER/PR positive HER2 negative pT1 apN0 cM0 status post bilateral mastectomy.  She is currently on tamoxifen and this is a routine follow-up visit  Clinically patient is doing well with no concerning signs and symptoms of recurrence based on today's exam.  Again I have reassured the patient that post bilateral mastectomy there would be no role for any routine mammograms.  There is also no role for routine lab work or tumor markers or surveillance scans in nonmetastatic breast cancer.  Breast cancer recurrence in the breast is very rare after bilateral mastectomy but sometimes it can be seen if 100% of the breast tissue is not out and breast cancer can develop in small residual breast tissue  left behind.  However that would not still justify a routine screening bilateral mammogram.  With regards to endocrine therapy patient is currently on tamoxifen.  She has not had any menstrual cycles for a year and therefore can be considered to be menopausal as such.  She is having ongoing hot flashes with tamoxifen.  We discussed following strategies moving forward 1.  Continue tamoxifen but no treatment for hot flashes 2.  Continue tamoxifen at 20 mg with consideration for gabapentin or paroxetine for hot flashes 3.  Consider decreasing the dose of tamoxifen to 10 mg.  Lower dose of tamoxifen has been studied in DCIS but not invasive cancer 4.  Switching to aromatase inhibitors such as Arimidex letrozole or Aromasin at this time.  Discussed risks and benefits of aromatase elevators including all but not limited to fatigue, joint pains, hot flashes and worsening bone health.  Patient understands all these options and would like to remain on tamoxifen at this time.  She will let us know if she changes her mind.  I will see her back in 6 months no labs   Visit Diagnosis 1. Malignant neoplasm of upper-outer quadrant of right breast in female, estrogen receptor positive ([place])   2. Encounter for monitoring tamoxifen therapy      Dr. Randa Evens, MD, MPH [facility] [phone] 09/16/2020 7:57 AM

## 2020-09-17 ENCOUNTER — Ambulatory Visit: Payer: BC Managed Care – PPO | Admitting: Physical Therapy

## 2020-09-22 ENCOUNTER — Ambulatory Visit: Payer: BC Managed Care – PPO | Admitting: Physical Therapy

## 2020-09-22 ENCOUNTER — Encounter: Payer: Self-pay | Admitting: Physical Therapy

## 2020-09-22 DIAGNOSIS — M25612 Stiffness of left shoulder, not elsewhere classified: Secondary | ICD-10-CM

## 2020-09-22 NOTE — Therapy (Signed)
[facility] [address] [place], [address] Phone: [phone]   Fax:  [phone]  Physical Therapy Treatment  Patient Details  Name: Tiffany Acevedo MRN: [id number] Date of Birth: [birth year] No data recorded  Encounter Date: 09/22/2020   PT End of Session - 09/22/20 1651     Visit Number 37    Number of Visits 38    Date for PT Re-Evaluation 11/14/20    Authorization Time Period no limit    Authorization - Visit Number 28    Authorization - Number of Visits 100    PT Start Time 4917    PT Stop Time 1640    PT Time Calculation (min) 32 min    Activity Tolerance Patient tolerated treatment well    Behavior During Therapy [facility] for tasks assessed/performed             Past Medical History:  Diagnosis Date   Allergy    Anxiety    Breast cancer ([place])    Depression    Excessive weight gain 06/30/2016   Family history of colon cancer    Family history of colon cancer    Family history of prostate cancer    Frequent headaches    GERD (gastroesophageal reflux disease)    History of kidney stones    Hypertension    Migraines    Pre-diabetes     Past Surgical History:  Procedure Laterality Date   BREAST BIOPSY Right 07/13/2019   stereo biopsy/ x clip/path pending   BREAST BIOPSY Right 07/13/2019   stereo biopsy/coil clip/ path pending   BREAST RECONSTRUCTION WITH PLACEMENT OF TISSUE EXPANDER AND FLEX HD (ACELLULAR HYDRATED DERMIS) Bilateral 08/27/2019   Procedure: BILATERAL BREAST RECONSTRUCTION WITH PLACEMENT OF TISSUE EXPANDER AND FLEX HD (ACELLULAR HYDRATED DERMIS);  Surgeon: Wallace Going, DO;  Location: [facility];  Service: Plastics;  Laterality: Bilateral;   [place], 2004, 2013   COLONOSCOPY  2015   CYSTOSCOPY W/ RETROGRADES Right 09/13/2018   Procedure: CYSTOSCOPY WITH RETROGRADE PYELOGRAM;  Surgeon: Abbie Sons, MD;  Location: [facility];  Service: Urology;   Laterality: Right;   CYSTOSCOPY/URETEROSCOPY/HOLMIUM LASER/STENT PLACEMENT Left 05/09/2018   Procedure: CYSTOSCOPY/URETEROSCOPY/HOLMIUM LASER/STENT PLACEMENT;  Surgeon: Abbie Sons, MD;  Location: [facility];  Service: Urology;  Laterality: Left;   CYSTOSCOPY/URETEROSCOPY/HOLMIUM LASER/STENT PLACEMENT Right 09/13/2018   Procedure: CYSTOSCOPY/URETEROSCOPY/HOLMIUM LASER/STENT PLACEMENT;  Surgeon: Abbie Sons, MD;  Location: [facility];  Service: Urology;  Laterality: Right;   INCONTINENCE SURGERY     BLADDER TUCK   LIPOSUCTION WITH LIPOFILLING Bilateral 05/05/2020   Procedure: LIPOSUCTION WITH LIPOFILLING; LIPOSUCTION OF EXCESS LATERAL BREAST TISSUE;  Surgeon: Wallace Going, DO;  Location: [place];  Service: Plastics;  Laterality: Bilateral;  90 min   PILONIDAL CYST EXCISION     REMOVAL OF BILATERAL TISSUE EXPANDERS WITH PLACEMENT OF BILATERAL BREAST IMPLANTS Bilateral 01/02/2020   Procedure: REMOVAL OF BILATERAL TISSUE EXPANDERS WITH PLACEMENT OF BILATERAL BREAST IMPLANTS;  Surgeon: Wallace Going, DO;  Location: [place];  Service: Plastics;  Laterality: Bilateral;  2 hours, please   STONE EXTRACTION WITH BASKET Right 09/13/2018   Procedure: STONE EXTRACTION WITH BASKET;  Surgeon: Abbie Sons, MD;  Location: [facility];  Service: Urology;  Laterality: Right;   TOTAL MASTECTOMY Bilateral 08/27/2019   Procedure: TOTAL MASTECTOMY;  Surgeon: Herbert Pun, MD;  Location: [facility];  Service: General;  Laterality: Bilateral;  START @930   DUE TO SENTINEL NODE   UPPER GI ENDOSCOPY      There were no vitals filed for this visit.   Subjective Assessment - 09/22/20 1612     Subjective Pt reports 0/10 pain on and was able to play golf this weekend. She reports being on a steroid taper to compliment her injection.    Pertinent History Pt is a 55 year old female s/p L proximal humerus fracture 12/02/10. PMH double masectomy 08/27/19 with expander  placement and reconstruction finished 01/03/20.Pt fell in [place] walking straight forward onto her shoulder. Patient reports fracture was non-displaced and she did not have surgery, by Dr. Adrian Prince advisement, wore sling 6 weeks. Currently does not know orthopedic expectations for weight restrictions/movement resistrictions, but has not been lifting more than 1/2 gallon of milk, but is no longer wearing sling. 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 Patient reports motion is very restricted and she is unable to don/doff clothes, her husband washes her hair for her, unable to push/pull, or lift anything. Pt is an avid golfer, and walks often. Worst shoulder pain over the past week 8/10 best 0/10 but reports she avoids painful movement.  Pt is R hand dominant. Pt denies N/V, B&B changes, unexplained weight fluctuation, saddle paresthesia, fever, night sweats, or unrelenting night pain at this time.    Limitations Lifting;House hold activities;Writing    How long can you sit comfortably? unlimited    How long can you stand comfortably? unlimited    How long can you walk comfortably? unlimited    Diagnostic tests Xrays [facility] 12/03/19    Patient Stated Goals Full ROM to complete self care             Therapeutic Exercise  AAROM TRX Flex/ABD 3 x 60 sec each direction  TRX Low Row 2 x 8 reps scap squeeze   Wall slides 1 x 10 without tband :1 x 10 yellow band: 1 x 10 reps with red tband  Standing shoulder extension 2 x 10 reps with cues to promote scapular retraction  Elevated push ups 1 x 5 reps from mat table @ highest position  Half plyo push-ups (bosuball) x 1 trial                        PT Education - 09/22/20 1651     Education Details therex form/technique    Person(s) Educated Patient    Methods Explanation;Demonstration    Comprehension Verbalized understanding;Returned demonstration               PT Short Term Goals - 09/15/20 1615       PT SHORT TERM GOAL #1   Title Pt will be independent with HEP in order to improve strength and decrease pain in order to improve pain-free function at home and work.    Baseline 01/23/20 HEP given; 05/21/20 completing prior to surgery, HEP updated today; 07/18/20 Completing HEP 50% of the time 09/15/20 completing 50% of the time    Time 4    Period Weeks    Status On-going      PT SHORT TERM GOAL #2   Title Pt will demonstrate full PROM to demonstrate joint mobility needed for for full AROM    Baseline 05/21/20 flex: 143 abd: 105 ER: 54 IR: 72:  07/17/20 flex 154d abd 106 ER 72d IR 76d; 09/15/20 163d; abd 130d ER 82d IR 87d  Time 4    Period Weeks    Status Partially Met               PT Long Term Goals - 09/15/20 1631       PT LONG TERM GOAL #1   Title Pt will demonstrate full active L shoulder ROM in order to complete self care and overhead ADLS    Baseline 52/77/82 flex 39d abd 39d  ER L ear IR L PSIS; 05/21/20 flex 116d abd 81d  ER C7 IR T12; 07/17/20 flex 134d abd 92d  ER C7 IR T12; 8/8/2 164d; abd 153d; ER CTJ; IR T12    Time 8    Period Weeks    Status Partially Met      PT LONG TERM GOAL #2   Title Pt will decrease worst pain as reported on NPRS by at least 3 points in order to demonstrate clinically significant reduction in pain.    Baseline 01/25/20 8/10; 05/21/20 7/10; 07/17/20 4/10    Time 8    Period Weeks    Status Achieved      PT LONG TERM GOAL #3   Title Pt will demonstrate L gross shoulder strength of 5/5 in order to demonstrate PLOF and strength needed for heavy household tasks    Baseline 01/23/20 flex 2+ abd 2+ ER 2+ IR 2+; 05/21/20 In availible range: flex: 4+ abd: 4- ER: 4+ IR: 4+; 07/17/20 In availible range: flex: 4+ abd: 4- ER: 4+ IR: 4+; 09/15/20 5/5 gross    Time 8    Period Weeks    Status Achieved      PT LONG TERM GOAL #4   Title Patient will increase FOTO score to 79 to demonstrate predicted increase in  functional mobility to complete ADLs    Baseline 07/17/20    Time 8      PT LONG TERM GOAL #5   Title Pt will demonstrate 4+ L lower trap strength in order to complete heavy overhead tasks    Baseline 09/15/20 3/5    Time 8    Period Weeks    Status New                   Plan - 09/22/20 1652     Clinical Impression Statement Pt tolerated session well with no reports of increase L shoulder pain with overhead or weight bearing activities. PT continued L periscapular strengthering and L shoulder mobility aided with multimodal cueing with good carry over. Continue PT POC.    Personal Factors and Comorbidities Comorbidity 1;Comorbidity 2;Past/Current Experience;Fitness;Time since onset of injury/illness/exacerbation    Comorbidities breast cancer, HTN, GERD, anxiety    Examination-Activity Limitations Bathing;Lift;Transfers;Reach Overhead;Carry;Dressing    Examination-Participation Restrictions Yard Work;Cleaning;Community Activity;Driving;Occupation;Laundry    Stability/Clinical Decision Making Evolving/Moderate complexity    Rehab Potential Good    PT Frequency 2x / week    PT Duration 8 weeks    PT Treatment/Interventions ADLs/Self Care Home Management;Electrical Stimulation;Therapeutic exercise;Balance training;Joint Manipulations;Taping;Spinal Manipulations;Cryotherapy;Iontophoresis 53m/ml Dexamethasone;DME Instruction;Neuromuscular re-education;Gait training;Stair training;Functional mobility training;Moist Heat;Traction;Ultrasound;Therapeutic activities;Patient/family education;Manual techniques;Dry needling;Passive range of motion;Vestibular    PT Next Visit Plan Manual therapy: MWM, Low load long duration stretching, and joint mobilizations    PT Home Exercise Plan see 04/07/20 note             Patient will benefit from skilled therapeutic intervention in order to improve the following deficits and impairments:  Decreased balance, Impaired flexibility, Impaired UE functional  use, Hypomobility, Decreased strength, Decreased  range of motion, Decreased endurance, Decreased activity tolerance, Postural dysfunction, Increased muscle spasms, Hypermobility, Decreased mobility, Decreased coordination, Abnormal gait, Pain, Improper body mechanics, Increased fascial restricitons  Visit Diagnosis: Stiffness of left shoulder, not elsewhere classified     Problem List Patient Active Problem List   Diagnosis Date Noted   Breast asymmetry following reconstructive surgery 03/25/2020   S/P breast reconstruction, bilateral 03/06/2020   Sebaceous cyst 12/31/2019   Closed fracture of proximal end of left humerus 12/03/2019   Broken humerus 12/02/2019   Breast wound 10/05/2019   Acquired absence of bilateral breasts and nipples 09/04/2019   Genetic testing 53/97/1410   Monoallelic mutation of CHEK2 gene in female patient 08/07/2019   Malignant neoplasm of upper-outer quadrant of right breast in female, estrogen receptor positive ([place]) 07/30/2019   Goals of care, counseling/discussion 07/30/2019   Family history of prostate cancer    Family history of colon cancer    Breast cancer ([place]) 07/25/2019   Left lower quadrant pain 05/23/2018   Personal history of kidney stones 04/20/2018   Microscopic hematuria 04/20/2018   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 [place], [address] Phone: [phone]   Fax:  [phone]  Name:  Bena Kobel MRN: [id number] Date of Birth: 10-12-1965

## 2020-09-24 ENCOUNTER — Ambulatory Visit: Payer: BC Managed Care – PPO | Admitting: Physical Therapy

## 2020-09-29 ENCOUNTER — Encounter: Payer: BC Managed Care – PPO | Admitting: Physical Therapy

## 2020-10-01 ENCOUNTER — Ambulatory Visit: Payer: BC Managed Care – PPO | Admitting: Physical Therapy

## 2020-10-06 ENCOUNTER — Other Ambulatory Visit: Payer: Self-pay | Admitting: Oncology

## 2020-10-06 ENCOUNTER — Ambulatory Visit: Payer: BC Managed Care – PPO | Admitting: Physical Therapy

## 2020-10-08 ENCOUNTER — Encounter: Payer: BC Managed Care – PPO | Admitting: Physical Therapy

## 2020-10-14 ENCOUNTER — Ambulatory Visit: Payer: BC Managed Care – PPO | Admitting: Physical Therapy

## 2020-10-21 ENCOUNTER — Ambulatory Visit: Payer: BC Managed Care – PPO | Attending: Oncology | Admitting: Physical Therapy

## 2020-10-21 ENCOUNTER — Encounter: Payer: Self-pay | Admitting: Physical Therapy

## 2020-10-21 ENCOUNTER — Other Ambulatory Visit: Payer: Self-pay

## 2020-10-21 DIAGNOSIS — M25612 Stiffness of left shoulder, not elsewhere classified: Secondary | ICD-10-CM | POA: Insufficient documentation

## 2020-10-21 NOTE — Therapy (Signed)
[facility] [address] [place], [address] Phone: [phone]   Fax:  [phone]  Physical Therapy Treatment  Patient Details  Name: Tiffany Acevedo MRN: [id number] Date of Birth: 1965/09/20 No data recorded  Encounter Date: 10/21/2020   PT End of Session - 10/21/20 1644     Visit Number 38    Number of Visits 50    Date for PT Re-Evaluation 11/14/20    Authorization - Visit Number 62    Authorization - Number of Visits 100    PT Start Time 3419    PT Stop Time 3790    PT Time Calculation (min) 32 min    Activity Tolerance Patient tolerated treatment well    Behavior During Therapy [facility] for tasks assessed/performed             Past Medical History:  Diagnosis Date   Allergy    Anxiety    Breast cancer ([place])    Depression    Excessive weight gain 06/30/2016   Family history of colon cancer    Family history of colon cancer    Family history of prostate cancer    Frequent headaches    GERD (gastroesophageal reflux disease)    History of kidney stones    Hypertension    Migraines    Pre-diabetes     Past Surgical History:  Procedure Laterality Date   BREAST BIOPSY Right 07/13/2019   stereo biopsy/ x clip/path pending   BREAST BIOPSY Right 07/13/2019   stereo biopsy/coil clip/ path pending   BREAST RECONSTRUCTION WITH PLACEMENT OF TISSUE EXPANDER AND FLEX HD (ACELLULAR HYDRATED DERMIS) Bilateral 08/27/2019   Procedure: BILATERAL BREAST RECONSTRUCTION WITH PLACEMENT OF TISSUE EXPANDER AND FLEX HD (ACELLULAR HYDRATED DERMIS);  Surgeon: Wallace Going, DO;  Location: [facility];  Service: Plastics;  Laterality: Bilateral;   [place], 2004, 2013   COLONOSCOPY  2015   CYSTOSCOPY W/ RETROGRADES Right 09/13/2018   Procedure: CYSTOSCOPY WITH RETROGRADE PYELOGRAM;  Surgeon: Abbie Sons, MD;  Location: [facility];  Service: Urology;  Laterality: Right;    CYSTOSCOPY/URETEROSCOPY/HOLMIUM LASER/STENT PLACEMENT Left 05/09/2018   Procedure: CYSTOSCOPY/URETEROSCOPY/HOLMIUM LASER/STENT PLACEMENT;  Surgeon: Abbie Sons, MD;  Location: [facility];  Service: Urology;  Laterality: Left;   CYSTOSCOPY/URETEROSCOPY/HOLMIUM LASER/STENT PLACEMENT Right 09/13/2018   Procedure: CYSTOSCOPY/URETEROSCOPY/HOLMIUM LASER/STENT PLACEMENT;  Surgeon: Abbie Sons, MD;  Location: [facility];  Service: Urology;  Laterality: Right;   INCONTINENCE SURGERY     BLADDER TUCK   LIPOSUCTION WITH LIPOFILLING Bilateral 05/05/2020   Procedure: LIPOSUCTION WITH LIPOFILLING; LIPOSUCTION OF EXCESS LATERAL BREAST TISSUE;  Surgeon: Wallace Going, DO;  Location: [place];  Service: Plastics;  Laterality: Bilateral;  90 min   PILONIDAL CYST EXCISION     REMOVAL OF BILATERAL TISSUE EXPANDERS WITH PLACEMENT OF BILATERAL BREAST IMPLANTS Bilateral 01/02/2020   Procedure: REMOVAL OF BILATERAL TISSUE EXPANDERS WITH PLACEMENT OF BILATERAL BREAST IMPLANTS;  Surgeon: Wallace Going, DO;  Location: [place];  Service: Plastics;  Laterality: Bilateral;  2 hours, please   STONE EXTRACTION WITH BASKET Right 09/13/2018   Procedure: STONE EXTRACTION WITH BASKET;  Surgeon: Abbie Sons, MD;  Location: [facility];  Service: Urology;  Laterality: Right;   TOTAL MASTECTOMY Bilateral 08/27/2019   Procedure: TOTAL MASTECTOMY;  Surgeon: Herbert Pun, MD;  Location: [facility];  Service: General;  Laterality: Bilateral;  START @930  DUE TO SENTINEL NODE   UPPER GI  ENDOSCOPY      There were no vitals filed for this visit.   Subjective Assessment - 10/21/20 1614     Subjective Pt describes L shoulder as pinching. She reports being busy with wedding planning and prepping and has not been active with HEP in the past week.    Pertinent History Pt is a 55 year old female s/p L proximal humerus fracture 12/02/10. PMH double masectomy 08/27/19 with expander placement  and reconstruction finished 01/03/20.Pt fell in [place] walking straight forward onto her shoulder. Patient reports fracture was non-displaced and she did not have surgery, by Dr. Adrian Prince advisement, wore sling 6 weeks. Currently does not know orthopedic expectations for weight restrictions/movement resistrictions, but has not been lifting more than 1/2 gallon of milk, but is no longer wearing sling. 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 Patient reports motion is very restricted and she is unable to don/doff clothes, her husband washes her hair for her, unable to push/pull, or lift anything. Pt is an avid golfer, and walks often. Worst shoulder pain over the past week 8/10 best 0/10 but reports she avoids painful movement.  Pt is R hand dominant. Pt denies N/V, B&B changes, unexplained weight fluctuation, saddle paresthesia, fever, night sweats, or unrelenting night pain at this time.    Limitations Lifting;House hold activities;Writing    How long can you sit comfortably? unlimited    How long can you stand comfortably? unlimited    How long can you walk comfortably? unlimited    Diagnostic tests Xrays [facility] 12/03/19    Patient Stated Goals Full ROM to complete self care              Therapeutic Exercise   AAROM TRX Flex/ABD 3 x 60 sec each direction  Total Total Gym pull ups 2 x 5 @ 17 and 15 with 60 sec hang @ end  Overhead wall slides with foam roller and red tband ABD 2 x 10 reps   Doorway stretch 3 x 30 sec  L shoulder Flex/ABD with slider on wall 1 x 15 reps   Manual therapy  Pec minor pin and stretch L shoulder  L shoulder distraction  L shoulder inferior mobilization with movement                             PT Education - 10/21/20 1652     Education Details educated on continuing HEP.    Person(s) Educated Patient    Methods Explanation    Comprehension Verbalized understanding               PT Short Term Goals - 09/15/20 1615       PT SHORT TERM GOAL #1   Title Pt will be independent with HEP in order to improve strength and decrease pain in order to improve pain-free function at home and work.    Baseline 01/23/20 HEP given; 05/21/20 completing prior to surgery, HEP updated today; 07/18/20 Completing HEP 50% of the time 09/15/20 completing 50% of the time    Time 4    Period Weeks    Status On-going      PT SHORT TERM GOAL #2   Title Pt will demonstrate full PROM to demonstrate joint mobility needed for for full AROM    Baseline 05/21/20 flex: 143 abd: 105 ER: 54 IR: 72:  07/17/20 flex 154d abd 106 ER 72d IR  76d; 09/15/20 163d; abd 130d ER 82d IR 87d    Time 4    Period Weeks    Status Partially Met               PT Long Term Goals - 09/15/20 1631       PT LONG TERM GOAL #1   Title Pt will demonstrate full active L shoulder ROM in order to complete self care and overhead ADLS    Baseline 02/63/78 flex 39d abd 39d  ER L ear IR L PSIS; 05/21/20 flex 116d abd 81d  ER C7 IR T12; 07/17/20 flex 134d abd 92d  ER C7 IR T12; 8/8/2 164d; abd 153d; ER CTJ; IR T12    Time 8    Period Weeks    Status Partially Met      PT LONG TERM GOAL #2   Title Pt will decrease worst pain as reported on NPRS by at least 3 points in order to demonstrate clinically significant reduction in pain.    Baseline 01/25/20 8/10; 05/21/20 7/10; 07/17/20 4/10    Time 8    Period Weeks    Status Achieved      PT LONG TERM GOAL #3   Title Pt will demonstrate L gross shoulder strength of 5/5 in order to demonstrate PLOF and strength needed for heavy household tasks    Baseline 01/23/20 flex 2+ abd 2+ ER 2+ IR 2+; 05/21/20 In availible range: flex: 4+ abd: 4- ER: 4+ IR: 4+; 07/17/20 In availible range: flex: 4+ abd: 4- ER: 4+ IR: 4+; 09/15/20 5/5 gross    Time 8    Period Weeks    Status Achieved      PT LONG TERM GOAL #4   Title Patient will increase FOTO score to 79 to demonstrate predicted  increase in functional mobility to complete ADLs    Baseline 07/17/20    Time 8      PT LONG TERM GOAL #5   Title Pt will demonstrate 4+ L lower trap strength in order to complete heavy overhead tasks    Baseline 09/15/20 3/5    Time 8    Period Weeks    Status New                   Plan - 10/21/20 1644     Clinical Impression Statement Pt tolerated session well with no reports of increased L shoulder pain with manual therapy and exercise. Pt demonstrated increased L shoulder tightness with decreased AROM. PT focused on overhead L shoulder AROM/AAROM. Pt educated to continue HEP with pulley and doorway stretch. Patient required multimodal cueing to ensure safe and effective exercise progression.    Personal Factors and Comorbidities Comorbidity 1;Comorbidity 2;Past/Current Experience;Fitness;Time since onset of injury/illness/exacerbation    Comorbidities breast cancer, HTN, GERD, anxiety    Examination-Activity Limitations Bathing;Lift;Transfers;Reach Overhead;Carry;Dressing    Examination-Participation Restrictions Yard Work;Cleaning;Community Activity;Driving;Occupation;Laundry    Rehab Potential Good    PT Frequency 2x / week    PT Duration 8 weeks    PT Treatment/Interventions ADLs/Self Care Home Management;Electrical Stimulation;Therapeutic exercise;Balance training;Joint Manipulations;Taping;Spinal Manipulations;Cryotherapy;Iontophoresis 71m/ml Dexamethasone;DME Instruction;Neuromuscular re-education;Gait training;Stair training;Functional mobility training;Moist Heat;Traction;Ultrasound;Therapeutic activities;Patient/family education;Manual techniques;Dry needling;Passive range of motion;Vestibular    PT Next Visit Plan Manual therapy: MWM, Low load long duration stretching, and joint mobilizations    PT Home Exercise Plan see 04/07/20 note    Consulted and Agree with Plan of Care Patient  Patient will benefit from skilled therapeutic intervention in order to  improve the following deficits and impairments:  Decreased balance, Impaired flexibility, Impaired UE functional use, Hypomobility, Decreased strength, Decreased range of motion, Decreased endurance, Decreased activity tolerance, Postural dysfunction, Increased muscle spasms, Hypermobility, Decreased mobility, Decreased coordination, Abnormal gait, Pain, Improper body mechanics, Increased fascial restricitons  Visit Diagnosis: Stiffness of left shoulder, not elsewhere classified     Problem List Patient Active Problem List   Diagnosis Date Noted   Breast asymmetry following reconstructive surgery 03/25/2020   S/P breast reconstruction, bilateral 03/06/2020   Sebaceous cyst 12/31/2019   Closed fracture of proximal end of left humerus 12/03/2019   Broken humerus 12/02/2019   Breast wound 10/05/2019   Acquired absence of bilateral breasts and nipples 09/04/2019   Genetic testing 83/29/1916   Monoallelic mutation of CHEK2 gene in female patient 08/07/2019   Malignant neoplasm of upper-outer quadrant of right breast in female, estrogen receptor positive ([place]) 07/30/2019   Goals of care, counseling/discussion 07/30/2019   Family history of prostate cancer    Family history of colon cancer    Breast cancer ([place]) 07/25/2019   Left lower quadrant pain 05/23/2018   Personal history of kidney stones 04/20/2018   Microscopic hematuria 04/20/2018   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 [place], [address] Phone: [phone]   Fax:  [phone]  Name: Tiffany Acevedo MRN: [id number] Date of Birth: 1965/11/28

## 2020-10-22 ENCOUNTER — Encounter: Payer: Self-pay | Admitting: Oncology

## 2020-10-22 NOTE — Telephone Encounter (Signed)
Please advise 

## 2020-10-23 ENCOUNTER — Other Ambulatory Visit: Payer: Self-pay | Admitting: *Deleted

## 2020-10-23 DIAGNOSIS — Z170 Estrogen receptor positive status [ER+]: Secondary | ICD-10-CM

## 2020-10-23 DIAGNOSIS — N939 Abnormal uterine and vaginal bleeding, unspecified: Secondary | ICD-10-CM

## 2020-10-23 DIAGNOSIS — C50411 Malignant neoplasm of upper-outer quadrant of right female breast: Secondary | ICD-10-CM

## 2020-10-23 DIAGNOSIS — Z79899 Other long term (current) drug therapy: Secondary | ICD-10-CM

## 2020-10-27 ENCOUNTER — Ambulatory Visit: Payer: BC Managed Care – PPO

## 2020-10-28 ENCOUNTER — Ambulatory Visit: Payer: BC Managed Care – PPO

## 2020-10-28 ENCOUNTER — Encounter: Payer: Self-pay | Admitting: Physical Therapy

## 2020-10-28 DIAGNOSIS — M25612 Stiffness of left shoulder, not elsewhere classified: Secondary | ICD-10-CM

## 2020-10-28 NOTE — Therapy (Signed)
[facility] [address], [place], [address] Phone: [phone]   Fax:  [phone]  Physical Therapy Treatment  Patient Details  Name: Tiffany Acevedo MRN: [id number] Date of Birth: 07-24-1965 No data recorded  Encounter Date: 10/28/2020   PT End of Session - 10/28/20 1729     Visit Number 39    Number of Visits 50    Date for PT Re-Evaluation 11/14/20    Authorization - Visit Number 69    Authorization - Number of Visits 100    PT Start Time 1620    PT Stop Time 8341    PT Time Calculation (min) 25 min    Activity Tolerance Patient tolerated treatment well    Behavior During Therapy [facility] for tasks assessed/performed             Past Medical History:  Diagnosis Date   Allergy    Anxiety    Breast cancer ([place])    Depression    Excessive weight gain 06/30/2016   Family history of colon cancer    Family history of colon cancer    Family history of prostate cancer    Frequent headaches    GERD (gastroesophageal reflux disease)    History of kidney stones    Hypertension    Migraines    Pre-diabetes     Past Surgical History:  Procedure Laterality Date   BREAST BIOPSY Right 07/13/2019   stereo biopsy/ x clip/path pending   BREAST BIOPSY Right 07/13/2019   stereo biopsy/coil clip/ path pending   BREAST RECONSTRUCTION WITH PLACEMENT OF TISSUE EXPANDER AND FLEX HD (ACELLULAR HYDRATED DERMIS) Bilateral 08/27/2019   Procedure: BILATERAL BREAST RECONSTRUCTION WITH PLACEMENT OF TISSUE EXPANDER AND FLEX HD (ACELLULAR HYDRATED DERMIS);  Surgeon: Wallace Going, DO;  Location: [facility];  Service: Plastics;  Laterality: Bilateral;   [place], 2004, 2013   COLONOSCOPY  2015   CYSTOSCOPY W/ RETROGRADES Right 09/13/2018   Procedure: CYSTOSCOPY WITH RETROGRADE PYELOGRAM;  Surgeon: Abbie Sons, MD;  Location: [facility];  Service: Urology;  Laterality: Right;    CYSTOSCOPY/URETEROSCOPY/HOLMIUM LASER/STENT PLACEMENT Left 05/09/2018   Procedure: CYSTOSCOPY/URETEROSCOPY/HOLMIUM LASER/STENT PLACEMENT;  Surgeon: Abbie Sons, MD;  Location: [facility];  Service: Urology;  Laterality: Left;   CYSTOSCOPY/URETEROSCOPY/HOLMIUM LASER/STENT PLACEMENT Right 09/13/2018   Procedure: CYSTOSCOPY/URETEROSCOPY/HOLMIUM LASER/STENT PLACEMENT;  Surgeon: Abbie Sons, MD;  Location: [facility];  Service: Urology;  Laterality: Right;   INCONTINENCE SURGERY     BLADDER TUCK   LIPOSUCTION WITH LIPOFILLING Bilateral 05/05/2020   Procedure: LIPOSUCTION WITH LIPOFILLING; LIPOSUCTION OF EXCESS LATERAL BREAST TISSUE;  Surgeon: Wallace Going, DO;  Location: [facility];  Service: Plastics;  Laterality: Bilateral;  90 min   PILONIDAL CYST EXCISION     REMOVAL OF BILATERAL TISSUE EXPANDERS WITH PLACEMENT OF BILATERAL BREAST IMPLANTS Bilateral 01/02/2020   Procedure: REMOVAL OF BILATERAL TISSUE EXPANDERS WITH PLACEMENT OF BILATERAL BREAST IMPLANTS;  Surgeon: Wallace Going, DO;  Location: [facility];  Service: Plastics;  Laterality: Bilateral;  2 hours, please   STONE EXTRACTION WITH BASKET Right 09/13/2018   Procedure: STONE EXTRACTION WITH BASKET;  Surgeon: Abbie Sons, MD;  Location: [facility];  Service: Urology;  Laterality: Right;   TOTAL MASTECTOMY Bilateral 08/27/2019   Procedure: TOTAL MASTECTOMY;  Surgeon: Herbert Pun, MD;  Location: [facility];  Service: General;  Laterality: Bilateral;  START @930  DUE TO SENTINEL NODE   UPPER GI  ENDOSCOPY      There were no vitals filed for this visit.   Subjective Assessment - 10/28/20 1622     Subjective Pt reports general soreness/tightness  in L shoulder but no pain.    Pertinent History Pt is a 55 year old female s/p L proximal humerus fracture 12/02/10. PMH double masectomy 08/27/19 with expander placement and reconstruction finished 01/03/20.Pt fell in [place] walking straight forward  onto her shoulder. Patient reports fracture was non-displaced and she did not have surgery, by Dr. Adrian Prince advisement, wore sling 6 weeks. Currently does not know orthopedic expectations for weight restrictions/movement resistrictions, but has not been lifting more than 1/2 gallon of milk, but is no longer wearing sling. 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 Patient reports motion is very restricted and she is unable to don/doff clothes, her husband washes her hair for her, unable to push/pull, or lift anything. Pt is an avid golfer, and walks often. Worst shoulder pain over the past week 8/10 best 0/10 but reports she avoids painful movement.  Pt is R hand dominant. Pt denies N/V, B&B changes, unexplained weight fluctuation, saddle paresthesia, fever, night sweats, or unrelenting night pain at this time.    Limitations Lifting;House hold activities;Writing    How long can you sit comfortably? unlimited    How long can you stand comfortably? unlimited    How long can you walk comfortably? unlimited    Diagnostic tests Xrays [facility] 12/03/19    Patient Stated Goals Full ROM to complete self care              Therapeutic Exercise   AAROM TRX Flex/ABD 3 x 60 sec each direction  Omega LAT pull down Hand on bar Flex/ABD 2 x 30sec; pt in control of tension   PVC Pull Over hang with 5# 2 x 30 sec   Manual Therapy  A-P Mobilization grade 3-4 for increase L shoulder flex  3 bouts x 30 sec  Inferior Mobilizations with movement flexion 3 bouts x 30 sec   Supine Shoulder flex passive/active: 145 deg prior to manual; 150 deg passive/active following      PT Education - 10/29/20 0932     Education Details therex form.technique    Person(s) Educated Patient    Methods Explanation;Demonstration    Comprehension Verbalized understanding;Returned demonstration              PT Short Term Goals - 09/15/20 1615       PT  SHORT TERM GOAL #1   Title Pt will be independent with HEP in order to improve strength and decrease pain in order to improve pain-free function at home and work.    Baseline 01/23/20 HEP given; 05/21/20 completing prior to surgery, HEP updated today; 07/18/20 Completing HEP 50% of the time 09/15/20 completing 50% of the time    Time 4    Period Weeks    Status On-going      PT SHORT TERM GOAL #2   Title Pt will demonstrate full PROM to demonstrate joint mobility needed for for full AROM    Baseline 05/21/20 flex: 143 abd: 105 ER: 54 IR: 72:  07/17/20 flex 154d abd 106 ER 72d IR 76d; 09/15/20 163d; abd 130d ER 82d IR 87d    Time 4    Period Weeks    Status Partially Met               PT Long  Term Goals - 09/15/20 1631       PT LONG TERM GOAL #1   Title Pt will demonstrate full active L shoulder ROM in order to complete self care and overhead ADLS    Baseline 16/10/96 flex 39d abd 39d  ER L ear IR L PSIS; 05/21/20 flex 116d abd 81d  ER C7 IR T12; 07/17/20 flex 134d abd 92d  ER C7 IR T12; 8/8/2 164d; abd 153d; ER CTJ; IR T12    Time 8    Period Weeks    Status Partially Met      PT LONG TERM GOAL #2   Title Pt will decrease worst pain as reported on NPRS by at least 3 points in order to demonstrate clinically significant reduction in pain.    Baseline 01/25/20 8/10; 05/21/20 7/10; 07/17/20 4/10    Time 8    Period Weeks    Status Achieved      PT LONG TERM GOAL #3   Title Pt will demonstrate L gross shoulder strength of 5/5 in order to demonstrate PLOF and strength needed for heavy household tasks    Baseline 01/23/20 flex 2+ abd 2+ ER 2+ IR 2+; 05/21/20 In availible range: flex: 4+ abd: 4- ER: 4+ IR: 4+; 07/17/20 In availible range: flex: 4+ abd: 4- ER: 4+ IR: 4+; 09/15/20 5/5 gross    Time 8    Period Weeks    Status Achieved      PT LONG TERM GOAL #4   Title Patient will increase FOTO score to 79 to demonstrate predicted increase in functional mobility to complete ADLs    Baseline 07/17/20     Time 8      PT LONG TERM GOAL #5   Title Pt will demonstrate 4+ L lower trap strength in order to complete heavy overhead tasks    Baseline 09/15/20 3/5    Time 8    Period Weeks    Status New                   Plan - 10/29/20 0454     Clinical Impression Statement Pt tolerated session well with no reports of increased L shoulder pain following manual therapy and exercise. PT focused on L shoulder mobility this session with patient displaying improved L shoulder AROM/PROM within session. Pt educated to continue HEP. Patient will continue to benefit from skilled therapy for exercise performance to ensure proper technique for safe and effective progression.    Personal Factors and Comorbidities Comorbidity 1;Comorbidity 2;Past/Current Experience;Fitness;Time since onset of injury/illness/exacerbation    Comorbidities breast cancer, HTN, GERD, anxiety    Examination-Activity Limitations Bathing;Lift;Transfers;Reach Overhead;Carry;Dressing    Examination-Participation Restrictions Yard Work;Cleaning;Community Activity;Driving;Occupation;Laundry    Stability/Clinical Decision Making Evolving/Moderate complexity    Clinical Decision Making Moderate    Rehab Potential Good    PT Frequency 2x / week    PT Duration 8 weeks    PT Treatment/Interventions ADLs/Self Care Home Management;Electrical Stimulation;Therapeutic exercise;Balance training;Joint Manipulations;Taping;Spinal Manipulations;Cryotherapy;Iontophoresis 59m/ml Dexamethasone;DME Instruction;Neuromuscular re-education;Gait training;Stair training;Functional mobility training;Moist Heat;Traction;Ultrasound;Therapeutic activities;Patient/family education;Manual techniques;Dry needling;Passive range of motion;Vestibular    PT Next Visit Plan Manual therapy: MWM, Low load long duration stretching, and joint mobilizations    Consulted and Agree with Plan of Care Patient             Patient will benefit from skilled therapeutic  intervention in order to improve the following deficits and impairments:  Decreased balance, Impaired flexibility, Impaired UE functional use, Hypomobility, Decreased strength, Decreased  range of motion, Decreased endurance, Decreased activity tolerance, Postural dysfunction, Increased muscle spasms, Hypermobility, Decreased mobility, Decreased coordination, Abnormal gait, Pain, Improper body mechanics, Increased fascial restricitons  Visit Diagnosis: Stiffness of left shoulder, not elsewhere classified     Problem List Patient Active Problem List   Diagnosis Date Noted   Breast asymmetry following reconstructive surgery 03/25/2020   S/P breast reconstruction, bilateral 03/06/2020   Sebaceous cyst 12/31/2019   Closed fracture of proximal end of left humerus 12/03/2019   Broken humerus 12/02/2019   Breast wound 10/05/2019   Acquired absence of bilateral breasts and nipples 09/04/2019   Genetic testing 37/34/2876   Monoallelic mutation of CHEK2 gene in female patient 08/07/2019   Malignant neoplasm of upper-outer quadrant of right breast in female, estrogen receptor positive ([place]) 07/30/2019   Goals of care, counseling/discussion 07/30/2019   Family history of prostate cancer    Family history of colon cancer    Breast cancer ([place]) 07/25/2019   Left lower quadrant pain 05/23/2018   Personal history of kidney stones 04/20/2018   Microscopic hematuria 04/20/2018   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 Fairly IV, PT, DPT Physical Therapist- [facility]   10/29/2020, 11:16 AM  [facility] [address], [place], [address] Phone: [phone]   Fax:  [phone]  Name: Tiffany Acevedo MRN: [id number] Date of Birth: [birth year]

## 2020-10-29 ENCOUNTER — Other Ambulatory Visit: Payer: Self-pay

## 2020-10-29 ENCOUNTER — Ambulatory Visit (INDEPENDENT_AMBULATORY_CARE_PROVIDER_SITE_OTHER): Payer: BC Managed Care – PPO

## 2020-10-29 ENCOUNTER — Inpatient Hospital Stay: Payer: BC Managed Care – PPO | Attending: Oncology | Admitting: Obstetrics and Gynecology

## 2020-10-29 VITALS — BP 159/93 | HR 73 | Temp 97.8°F | Resp 20 | Wt 161.1 lb

## 2020-10-29 VITALS — BP 138/76 | HR 88 | Temp 98.2°F | Ht 64.0 in | Wt 160.0 lb

## 2020-10-29 DIAGNOSIS — Z79810 Long term (current) use of selective estrogen receptor modulators (SERMs): Secondary | ICD-10-CM | POA: Insufficient documentation

## 2020-10-29 DIAGNOSIS — Z9013 Acquired absence of bilateral breasts and nipples: Secondary | ICD-10-CM | POA: Diagnosis not present

## 2020-10-29 DIAGNOSIS — C50411 Malignant neoplasm of upper-outer quadrant of right female breast: Secondary | ICD-10-CM | POA: Insufficient documentation

## 2020-10-29 DIAGNOSIS — R1030 Lower abdominal pain, unspecified: Secondary | ICD-10-CM | POA: Insufficient documentation

## 2020-10-29 DIAGNOSIS — N939 Abnormal uterine and vaginal bleeding, unspecified: Secondary | ICD-10-CM | POA: Diagnosis not present

## 2020-10-29 DIAGNOSIS — N950 Postmenopausal bleeding: Secondary | ICD-10-CM

## 2020-10-29 DIAGNOSIS — Z780 Asymptomatic menopausal state: Secondary | ICD-10-CM | POA: Insufficient documentation

## 2020-10-29 NOTE — Progress Notes (Signed)
Patient states she has been bleeding for 2 weeks and have not had a period since may 2021. Patient states bleeding stopped on Monday 9/19.   Patient states it started off light brown for 4-5 days and then turned bright red shortly after. Patient states she was also having some lower abdominal pain that she assumed was cramping.

## 2020-10-29 NOTE — Progress Notes (Signed)
Gynecologic Oncology Consult Visit   Referring Provider: Dr Janese Banks  Chief Concern: Postmenopausal bleeding  Subjective:  Tiffany Acevedo is a 55 y.o. P4 female who is seen in consultation from Dr. Carrie Mew for PMB.   She has breast cancer s/p bilateral mastectomies and has been on TMX since 11/21.  Started having hot flashes then. Plan for 5 years of treatment.   Patient states she has been bleeding for 2 weeks and have not had a period since may 2021. Patient states bleeding stopped on Monday 9/19.    Patient states it started off light brown for 4-5 days and then turned bright red shortly after. Patient states she was also having some lower abdominal pain that she assumed was cramping.  No prior hormone replacement therapy.   Problem List: Patient Active Problem List   Diagnosis Date Noted   Breast asymmetry following reconstructive surgery 03/25/2020   S/P breast reconstruction, bilateral 03/06/2020   Sebaceous cyst 12/31/2019   Closed fracture of proximal end of left humerus 12/03/2019   Broken humerus 12/02/2019   Breast wound 10/05/2019   Acquired absence of bilateral breasts and nipples 09/04/2019   Genetic testing 34/19/6222   Monoallelic mutation of CHEK2 gene in female patient 08/07/2019   Malignant neoplasm of upper-outer quadrant of right breast in female, estrogen receptor positive ([place]) 07/30/2019   Goals of care, counseling/discussion 07/30/2019   Family history of prostate cancer    Family history of colon cancer    Breast cancer ([place]) 07/25/2019   Left lower quadrant pain 05/23/2018   Personal history of kidney stones 04/20/2018   Microscopic hematuria 04/20/2018   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 01/22/2017   Overweight (BMI 25.0-29.9) 01/22/2017   Encounter for preventive health examination 97/98/9211   Renal colic 94/17/4081   Ureteral calculus 04/12/2015   Nephrolithiasis 03/04/2015   Intractable migraine with aura without status  migrainosus 10/29/2013   Lack of libido 06/24/2013   Migraine syndrome 03/11/2013   Generalized anxiety disorder 03/11/2013    Past Medical History: Past Medical History:  Diagnosis Date   Allergy    Anxiety    Breast cancer ([place])    Depression    Excessive weight gain 06/30/2016   Family history of colon cancer    Family history of colon cancer    Family history of prostate cancer    Frequent headaches    GERD (gastroesophageal reflux disease)    History of kidney stones    Hypertension    Migraines    Pre-diabetes     Past Surgical History: Past Surgical History:  Procedure Laterality Date   BREAST BIOPSY Right 07/13/2019   stereo biopsy/ x clip/path pending   BREAST BIOPSY Right 07/13/2019   stereo biopsy/coil clip/ path pending   BREAST RECONSTRUCTION WITH PLACEMENT OF TISSUE EXPANDER AND FLEX HD (ACELLULAR HYDRATED DERMIS) Bilateral 08/27/2019   Procedure: BILATERAL BREAST RECONSTRUCTION WITH PLACEMENT OF TISSUE EXPANDER AND FLEX HD (ACELLULAR HYDRATED DERMIS);  Surgeon: Wallace Going, DO;  Location: [facility];  Service: Plastics;  Laterality: Bilateral;   [place], 2004, 2013   COLONOSCOPY  2015   CYSTOSCOPY W/ RETROGRADES Right 09/13/2018   Procedure: CYSTOSCOPY WITH RETROGRADE PYELOGRAM;  Surgeon: Abbie Sons, MD;  Location: [facility];  Service: Urology;  Laterality: Right;   CYSTOSCOPY/URETEROSCOPY/HOLMIUM LASER/STENT PLACEMENT Left 05/09/2018   Procedure: CYSTOSCOPY/URETEROSCOPY/HOLMIUM LASER/STENT PLACEMENT;  Surgeon: Abbie Sons, MD;  Location: [facility];  Service: Urology;  Laterality: Left;   CYSTOSCOPY/URETEROSCOPY/HOLMIUM  LASER/STENT PLACEMENT Right 09/13/2018   Procedure: CYSTOSCOPY/URETEROSCOPY/HOLMIUM LASER/STENT PLACEMENT;  Surgeon: Abbie Sons, MD;  Location: [facility];  Service: Urology;  Laterality: Right;   INCONTINENCE SURGERY     BLADDER TUCK   LIPOSUCTION WITH LIPOFILLING Bilateral 05/05/2020   Procedure: LIPOSUCTION WITH  LIPOFILLING; LIPOSUCTION OF EXCESS LATERAL BREAST TISSUE;  Surgeon: Wallace Going, DO;  Location: [facility];  Service: Plastics;  Laterality: Bilateral;  90 min   PILONIDAL CYST EXCISION     REMOVAL OF BILATERAL TISSUE EXPANDERS WITH PLACEMENT OF BILATERAL BREAST IMPLANTS Bilateral 01/02/2020   Procedure: REMOVAL OF BILATERAL TISSUE EXPANDERS WITH PLACEMENT OF BILATERAL BREAST IMPLANTS;  Surgeon: Wallace Going, DO;  Location: [facility];  Service: Plastics;  Laterality: Bilateral;  2 hours, please   STONE EXTRACTION WITH BASKET Right 09/13/2018   Procedure: STONE EXTRACTION WITH BASKET;  Surgeon: Abbie Sons, MD;  Location: [facility];  Service: Urology;  Laterality: Right;   TOTAL MASTECTOMY Bilateral 08/27/2019   Procedure: TOTAL MASTECTOMY;  Surgeon: Herbert Pun, MD;  Location: [facility];  Service: General;  Laterality: Bilateral;  START @930  DUE TO SENTINEL NODE   UPPER GI ENDOSCOPY          Family History: Family History  Problem Relation Age of Onset   Hyperlipidemia Mother    Hypertension Mother    Depression Mother    Colon cancer Mother 76   Alcohol abuse Father    Hyperlipidemia Father    Stroke Father    Hypertension Father    Depression Father    Heart disease Father    Prostate cancer Father 85   Arthritis Maternal Grandmother    Hypertension Maternal Grandmother    Depression Maternal Grandmother    Arthritis Maternal Grandfather    Stroke Maternal Grandfather    Hypertension Maternal Grandfather    Depression Maternal Grandfather    Hypertension Paternal Grandmother    Depression Paternal Grandmother    Hypertension Paternal Grandfather    Depression Paternal Grandfather    Leukemia Maternal Aunt        d <50   Cancer Paternal Aunt        unsure types    Social History: Social History   Socioeconomic History   Marital status: Married    Spouse name: Not on file   Number of children: Not on file    Years of education: Not on file   Highest education level: Not on file  Occupational History   Not on file  Tobacco Use   Smoking status: Never   Smokeless tobacco: Never  Vaping Use   Vaping Use: Never used  Substance and Sexual Activity   Alcohol use: Yes    Comment: glass of wine occassionally   Drug use: No   Sexual activity: Yes    Partners: Male  Other Topics Concern   Not on file  Social History Narrative   Not on file   Social Determinants of Health   Financial Resource Strain: Not on file  Food Insecurity: Not on file  Transportation Needs: Not on file  Physical Activity: Not on file  Stress: Not on file  Social Connections: Not on file  Intimate Partner Violence: Not on file    Allergies: Allergies  Allergen Reactions   Oxycodone-Acetaminophen    Percocet [Oxycodone-Acetaminophen] Other (See Comments)    Arms and legs go numb   Sulfa Antibiotics Nausea And Vomiting    Current Medications: Current Outpatient Medications  Medication  Sig Dispense Refill   Ascorbic Acid (VITAMIN C) 100 MG tablet Take 500 mg by mouth daily.     Calcium Carbonate-Vit D-Min (CALCIUM 1200 PO) Take 1 tablet by mouth daily.      Fremanezumab-vfrm (AJOVY) 225 MG/1.5ML SOAJ Inject into the skin.     hydrochlorothiazide (MICROZIDE) 12.5 MG capsule Take 1 capsule (12.5 mg total) by mouth daily. 30 capsule 2   ibuprofen (ADVIL,MOTRIN) 200 MG tablet Take 400 mg by mouth every 6 (six) hours as needed for headache or moderate pain.      Ibuprofen-diphenhydrAMINE Cit (MOTRIN PM PO) Take 1 tablet by mouth at bedtime.     imipramine (TOFRANIL) 50 MG tablet Take 50 mg by mouth at bedtime.      metoprolol succinate (TOPROL-XL) 100 MG 24 hr tablet Take 25 mg by mouth daily.     Multiple Vitamin (MULTIVITAMIN PO) Take 1 tablet by mouth daily.     omeprazole (PRILOSEC) 20 MG capsule Take 20 mg by mouth daily.      rizatriptan (MAXALT) 10 MG tablet Take 10 mg by mouth as needed for migraine.       venlafaxine XR (EFFEXOR-XR) 150 MG 24 hr capsule TAKE 1 CAPSULE BY MOUTH EVERY DAY WITH BREAKFAST (Patient taking differently: Take 150 mg by mouth daily with breakfast.) 30 capsule 5   Zinc 22.5 MG TABS Take 1 tablet by mouth daily.     tamoxifen (NOLVADEX) 20 MG tablet TAKE ONE TABLET BY MOUTH EVERY DAY (Patient not taking: Reported on 10/29/2020) 30 tablet 6   No current facility-administered medications for this visit.    Review of Systems General: negative for, fevers, chills, fatigue, changes in sleep, changes in weight or appetite Skin: negative for changes in color, texture, moles or lesions Eyes: negative for, changes in vision, pain, diplopia HEENT: negative for, change in hearing, pain, discharge, tinnitus, vertigo, voice changes, sore throat, neck masses Pulmonary: negative for, dyspnea, orthopnea, productive cough Cardiac: negative for, palpitations, syncope, pain, discomfort, pressure Gastrointestinal: negative for, dysphagia, nausea, vomiting, jaundice, pain, constipation, diarrhea, hematemesis, hematochezia Genitourinary/Sexual: negative for, dysuria, discharge, hesitancy, nocturia, retention, stones, infections, STD's, incontinence Musculoskeletal: negative for, pain, stiffness, swelling, range of motion limitation Hematology: negative for, easy bruising, bleeding Neurologic/Psych: negative for, headaches, seizures, paralysis, weakness, tremor, change in gait, change in sensation, mood swings, depression, anxiety, change in memory  Objective:  Physical Examination:  BP (!) 159/93   Pulse 73   Temp 97.8 F (36.6 C)   Resp 20   Wt 161 lb 1.6 oz (73.1 kg)   SpO2 100%   BMI 27.65 kg/m    ECOG Performance Status: 0 - Asymptomatic  General appearance: alert, cooperative, and appears stated age HEENT:PERRLA and thyroid without masses Lymph node survey: non-palpable, axillary, inguinal, supraclavicular Cardiovascular: regular rate and rhythm, no murmurs or  gallops Respiratory: normal air entry, lungs clear to auscultation and no rales, rhonchi or wheezing Abdomen: no hernias and well healed incision Back: inspection of back is normal Extremities: extremities normal, atraumatic, no cyanosis or edema Skin exam - normal coloration and turgor, no rashes, no suspicious skin lesions noted. Neurological exam reveals alert, oriented, normal speech, no focal findings or movement disorder noted.  Pelvic: exam chaperoned by nurse, EGBUS within normal limits;  Vulva: normal appearing vulva with no masses, tenderness or lesions; Vagina: normal vagina; Adnexa: normal adnexa in size, nontender and no masses; Uterus: uterus is normal size, shape, consistency and nontender; Cervix: anteverted; Rectal: normal rectal, no masses  Procedure note: Time out done and consent signed for endometrial biopsy.  Pipelle went in easily and sounded to about 6 cm.  Small amount of blood and mucous with tissue obtained. She tolerated the procedure well with no pain or complications.     Assessment:  Aishah Teffeteller is a 55 y.o. female diagnosed with postmenopausal bleeding on tamoxifen that was started in 11/21 after surgery for breast cancer.    Medical co-morbidities complicating care: C section x 3 Plan:   Problem List Items Addressed This Visit       Other   Postmenopausal bleeding   Other Visit Diagnoses     Vaginal bleeding    -  Primary   Relevant Orders   Surgical pathology   US PELVIC COMPLETE WITH TRANSVAGINAL      Endometrial biopsy done today and will order pelvic US. Discussed that if biopsy shows cancer we will recommend surgery.  She has stopped the tamoxifen and we will determine based on the biopsy and [place] if this can be restarted, as the plan was for 5 years of treatment.  The patient's diagnosis, an outline of the further diagnostic and laboratory studies which will be required, the recommendation, and alternatives were discussed.  All  questions were answered to the patient's satisfaction.  A total of 45 minutes were spent with the patient/family today; 25 % was spent in education, counseling and coordination of care for post menopausal bleeding.    Mellody Drown, MD   CC:  Marinda Elk, MD [facility] [place],  [place] [phone]

## 2020-10-30 NOTE — Patient Instructions (Signed)
She will use vaseline/xeroform & gauze as needed until healed She will call for any concerns F/U - if needed

## 2020-10-30 NOTE — Progress Notes (Signed)
NIPPLE AREOLAR TATTOO PROCEDURE  PREOPERATIVE DIAGNOSIS:  Acquired absence of BILATERAL nipple areolar   POSTOPERATIVE DIAGNOSIS: Acquired absence of BILATERAL nipple areolar    PROCEDURES: BILATERAL nipple areolar tattoo  touch-up  ATTENDING SURGEON: Dr. Lyndee Leo Dillingham  ANESTHESIA:  EMLA  COMPLICATIONS: None.  JUSTIFICATION FOR PROCEDURE:  Tiffany Acevedo is a 55 y.o. female with a history of breast cancer status post bilateral breast reconstruction. The patient presents for bilateral nipple areolar complex tattoo touch-up. Risks, benefits, indications, and alternatives of the above described procedures were discussed with the patient and all the patient's questions were answered.   DESCRIPTION OF PROCEDURE: After informed consent was obtained and proper identification of patient and surgical site was made, the patient was taken to the procedure room and pre-procedure photos taken & entered into chart. Patient was then placed supine on the operating room table. A time out was performed to confirm patient's identity and surgical site. The patient was prepped and draped in the usual sterile fashion. Using a # 7 pronged/round tattoo head, pigment was instilled to the designed  bilateral nipple areolar complexes.  Once adequate pigment had been applied - a post-procedure photo taken.  Vaseline/xeroform & gauze dressing was applied.  The patient tolerated the procedure well.  World Famous Tattoo Ink used: Warm Mink / Warm Peach / Fair Honey / Chipper [place] / Dark Tan Duration used for anesthesia Lot#'s & exp dates on file

## 2020-10-31 LAB — SURGICAL PATHOLOGY

## 2020-11-04 ENCOUNTER — Ambulatory Visit: Payer: BC Managed Care – PPO | Admitting: Physical Therapy

## 2020-11-04 ENCOUNTER — Telehealth: Payer: Self-pay

## 2020-11-04 NOTE — Telephone Encounter (Signed)
Called with endometrial biopsy results. Left voicemail. Pelvic US scheduled for 10/3.

## 2020-11-06 ENCOUNTER — Telehealth: Payer: Self-pay | Admitting: Nurse Practitioner

## 2020-11-06 NOTE — Telephone Encounter (Signed)
Spoke to patient re: biopsy results which were negative for dysplasia or malignancy. Pelvic ultrasound as scheduled. She will continue to hold tamoxifen at this time.

## 2020-11-10 ENCOUNTER — Other Ambulatory Visit: Payer: Self-pay

## 2020-11-10 ENCOUNTER — Ambulatory Visit
Admission: RE | Admit: 2020-11-10 | Discharge: 2020-11-10 | Disposition: A | Discharge status: Home / Self Care | Payer: BC Managed Care – PPO | Source: Ambulatory Visit | Attending: Obstetrics and Gynecology | Admitting: Obstetrics and Gynecology

## 2020-11-10 DIAGNOSIS — N939 Abnormal uterine and vaginal bleeding, unspecified: Secondary | ICD-10-CM | POA: Insufficient documentation

## 2020-11-11 ENCOUNTER — Telehealth: Payer: Self-pay

## 2020-11-11 ENCOUNTER — Ambulatory Visit: Payer: BC Managed Care – PPO | Attending: Oncology | Admitting: Physical Therapy

## 2020-11-11 DIAGNOSIS — M25612 Stiffness of left shoulder, not elsewhere classified: Secondary | ICD-10-CM | POA: Diagnosis not present

## 2020-11-11 NOTE — Therapy (Signed)
[facility] [address], [place], [address] Phone: [phone]   Fax:  [phone]  Physical Therapy Treatment/Progress Note Reporting Period 09/15/20 - 11/12/20  Patient Details  Name: Tiffany Acevedo MRN: [id number] Date of Birth: [birth year] No data recorded  Encounter Date: 11/11/2020   PT End of Session - 11/11/20 1619     Visit Number 40    Number of Visits 46    Authorization - Visit Number 44    Authorization - Number of Visits 100    PT Start Time 2542    PT Stop Time 1645    PT Time Calculation (min) 35 min    Activity Tolerance Patient tolerated treatment well    Behavior During Therapy [facility] for tasks assessed/performed             Past Medical History:  Diagnosis Date   Allergy    Anxiety    Breast cancer ([place])    Depression    Excessive weight gain 06/30/2016   Family history of colon cancer    Family history of colon cancer    Family history of prostate cancer    Frequent headaches    GERD (gastroesophageal reflux disease)    History of kidney stones    Hypertension    Migraines    Pre-diabetes     Past Surgical History:  Procedure Laterality Date   BREAST BIOPSY Right 07/13/2019   stereo biopsy/ x clip/path pending   BREAST BIOPSY Right 07/13/2019   stereo biopsy/coil clip/ path pending   BREAST RECONSTRUCTION WITH PLACEMENT OF TISSUE EXPANDER AND FLEX HD (ACELLULAR HYDRATED DERMIS) Bilateral 08/27/2019   Procedure: BILATERAL BREAST RECONSTRUCTION WITH PLACEMENT OF TISSUE EXPANDER AND FLEX HD (ACELLULAR HYDRATED DERMIS);  Surgeon: Wallace Going, DO;  Location: [facility];  Service: Plastics;  Laterality: Bilateral;   [place], 2004, 2013   COLONOSCOPY  2015   CYSTOSCOPY W/ RETROGRADES Right 09/13/2018   Procedure: CYSTOSCOPY WITH RETROGRADE PYELOGRAM;  Surgeon: Abbie Sons, MD;  Location: [facility];  Service: Urology;  Laterality: Right;    CYSTOSCOPY/URETEROSCOPY/HOLMIUM LASER/STENT PLACEMENT Left 05/09/2018   Procedure: CYSTOSCOPY/URETEROSCOPY/HOLMIUM LASER/STENT PLACEMENT;  Surgeon: Abbie Sons, MD;  Location: [facility];  Service: Urology;  Laterality: Left;   CYSTOSCOPY/URETEROSCOPY/HOLMIUM LASER/STENT PLACEMENT Right 09/13/2018   Procedure: CYSTOSCOPY/URETEROSCOPY/HOLMIUM LASER/STENT PLACEMENT;  Surgeon: Abbie Sons, MD;  Location: [facility];  Service: Urology;  Laterality: Right;   INCONTINENCE SURGERY     BLADDER TUCK   LIPOSUCTION WITH LIPOFILLING Bilateral 05/05/2020   Procedure: LIPOSUCTION WITH LIPOFILLING; LIPOSUCTION OF EXCESS LATERAL BREAST TISSUE;  Surgeon: Wallace Going, DO;  Location: [place];  Service: Plastics;  Laterality: Bilateral;  90 min   PILONIDAL CYST EXCISION     REMOVAL OF BILATERAL TISSUE EXPANDERS WITH PLACEMENT OF BILATERAL BREAST IMPLANTS Bilateral 01/02/2020   Procedure: REMOVAL OF BILATERAL TISSUE EXPANDERS WITH PLACEMENT OF BILATERAL BREAST IMPLANTS;  Surgeon: Wallace Going, DO;  Location: [place];  Service: Plastics;  Laterality: Bilateral;  2 hours, please   STONE EXTRACTION WITH BASKET Right 09/13/2018   Procedure: STONE EXTRACTION WITH BASKET;  Surgeon: Abbie Sons, MD;  Location: [facility];  Service: Urology;  Laterality: Right;   TOTAL MASTECTOMY Bilateral 08/27/2019   Procedure: TOTAL MASTECTOMY;  Surgeon: Herbert Pun, MD;  Location: [facility];  Service: General;  Laterality: Bilateral;  START @930  DUE TO SENTINEL NODE   UPPER GI ENDOSCOPY  There were no vitals filed for this visit.   Subjective Assessment - 11/11/20 1614     Subjective Pt denies any shoulder pain at rest and most AROM.    Pertinent History Pt is a 55 year old female s/p L proximal humerus fracture 12/02/10. PMH double masectomy 08/27/19 with expander placement and reconstruction finished 01/03/20.Pt fell in [place] walking straight forward onto her shoulder.  Patient reports fracture was non-displaced and she did not have surgery, by Dr. Adrian Prince advisement, wore sling 6 weeks. Currently does not know orthopedic expectations for weight restrictions/movement resistrictions, but has not been lifting more than 1/2 gallon of milk, but is no longer wearing sling. 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 Patient reports motion is very restricted and she is unable to don/doff clothes, her husband washes her hair for her, unable to push/pull, or lift anything. Pt is an avid golfer, and walks often. Worst shoulder pain over the past week 8/10 best 0/10 but reports she avoids painful movement.  Pt is R hand dominant. Pt denies N/V, B&B changes, unexplained weight fluctuation, saddle paresthesia, fever, night sweats, or unrelenting night pain at this time.    Limitations Lifting;House hold activities;Writing    How long can you sit comfortably? unlimited    How long can you stand comfortably? unlimited    How long can you walk comfortably? unlimited    Diagnostic tests Xrays [facility] 12/03/19    Patient Stated Goals Full ROM to complete self care              Therapeutic Exercise   AAROM TRX Flex/ABD 1 x 20 reps  Standing Y on foam roller 2 x 10 reps    Omega LAT pull down 35# 3 x 10 reps emphasis on scap retraction   Modified Push up plus on treadmill railing 2 x 6 reps   Horizontal ADD stretch L shoulder 3 x 30sec   T- Spine rotation against wall x 6 reps holding 10 sec each side            PT Education - 11/11/20 1650     Education Details therex form/technique    Person(s) Educated Patient    Methods Explanation;Demonstration    Comprehension Verbalized understanding;Returned demonstration              PT Short Term Goals - 11/12/20 1039       PT SHORT TERM GOAL #1   Title Pt will be independent with HEP in order to improve strength and decrease pain in order to  improve pain-free function at home and work.    Baseline 01/23/20 HEP given; 05/21/20 completing prior to surgery, HEP updated today; 07/18/20 Completing HEP 50% of the time 09/15/20 completing 50% of the time; 11/12/20 completes current HEP regularly, HEP updated today    Time 4    Period Weeks    Status On-going      PT SHORT TERM GOAL #2   Title Pt will demonstrate full PROM to demonstrate joint mobility needed for for full AROM    Baseline 05/21/20 flex: 143 abd: 105 ER: 54 IR: 72:  07/17/20 flex 154d abd 106 ER 72d IR 76d; 09/15/20 163d; abd 130d ER 82d IR 87d; 11/12/20 ER and IR WNL; Abd and flex approx 160d    Time 4    Period Weeks    Status Partially Met  PT Long Term Goals - 11/12/20 1040       PT LONG TERM GOAL #1   Title Pt will demonstrate full active L shoulder ROM in order to complete self care and overhead ADLS    Baseline 17/61/60 flex 39d abd 39d  ER L ear IR L PSIS; 05/21/20 flex 116d abd 81d  ER C7 IR T12; 07/17/20 flex 134d abd 92d  ER C7 IR T12; 8/8/2 164d; abd 153d; ER CTJ; IR T12;    Time 8    Period Weeks    Status Deferred      PT LONG TERM GOAL #2   Title Pt will decrease worst pain as reported on NPRS by at least 3 points in order to demonstrate clinically significant reduction in pain.    Baseline 01/25/20 8/10; 05/21/20 7/10; 07/17/20 4/10    Time 8    Status Achieved      PT LONG TERM GOAL #3   Title Pt will demonstrate L gross shoulder strength of 5/5 in order to demonstrate PLOF and strength needed for heavy household tasks    Baseline 01/23/20 flex 2+ abd 2+ ER 2+ IR 2+; 05/21/20 In availible range: flex: 4+ abd: 4- ER: 4+ IR: 4+; 07/17/20 In availible range: flex: 4+ abd: 4- ER: 4+ IR: 4+; 09/15/20 5/5 gross    Time 8    Period Weeks    Status Achieved      PT LONG TERM GOAL #4   Title Patient will increase FOTO score to 79 to demonstrate predicted increase in functional mobility to complete ADLs    Baseline Initial 28; 09/15/20 69    Time 8     Period Weeks    Status Achieved      PT LONG TERM GOAL #5   Title Pt will demonstrate 4+ L lower trap strength in order to complete heavy overhead tasks    Baseline 09/15/20 3/5    Time 8    Period Weeks    Status Deferred                   Plan - 11/11/20 1646     Clinical Impression Statement Pt tolerated session well with minimal reports of increased L shoulder pain only with WB exercise. PT focused on L shoulder  and thoracic mobility this session. Pt educated to continue HEP with added shoulder mobility exercises. Patient is continuing to demonstrate small improvements in ROM with decreased pain, and better scapulohumeral rhythm. PT discussed with patient possibly d/c PT in the near future to complete continued progress with robust HEP as she increases independence of therex without cuing. Patient will continue to benefit from skilled therapy for exercise performance to ensure proper technique for safe and effective progression.    Personal Factors and Comorbidities Comorbidity 1;Comorbidity 2;Past/Current Experience;Fitness;Time since onset of injury/illness/exacerbation    Comorbidities breast cancer, HTN, GERD, anxiety    Examination-Activity Limitations Bathing;Lift;Transfers;Reach Overhead;Carry;Dressing    Examination-Participation Restrictions Yard Work;Cleaning;Community Activity;Driving;Occupation;Laundry    Stability/Clinical Decision Making Evolving/Moderate complexity    Clinical Decision Making Moderate    Rehab Potential Good    PT Frequency 2x / week    PT Duration 8 weeks    PT Treatment/Interventions ADLs/Self Care Home Management;Electrical Stimulation;Therapeutic exercise;Balance training;Joint Manipulations;Taping;Spinal Manipulations;Cryotherapy;Iontophoresis 74m/ml Dexamethasone;DME Instruction;Neuromuscular re-education;Gait training;Stair training;Functional mobility training;Moist Heat;Traction;Ultrasound;Therapeutic activities;Patient/family  education;Manual techniques;Dry needling;Passive range of motion;Vestibular    PT Next Visit Plan formal AROM and Y strength test for LTG update, d/c planning  PT Home Exercise Plan see 04/07/20 note    Consulted and Agree with Plan of Care Patient             Patient will benefit from skilled therapeutic intervention in order to improve the following deficits and impairments:  Decreased balance, Impaired flexibility, Impaired UE functional use, Hypomobility, Decreased strength, Decreased range of motion, Decreased endurance, Decreased activity tolerance, Postural dysfunction, Increased muscle spasms, Hypermobility, Decreased mobility, Decreased coordination, Abnormal gait, Pain, Improper body mechanics, Increased fascial restricitons  Visit Diagnosis: Stiffness of left shoulder, not elsewhere classified     Problem List Patient Active Problem List   Diagnosis Date Noted   Postmenopausal bleeding 10/29/2020   Breast asymmetry following reconstructive surgery 03/25/2020   S/P breast reconstruction, bilateral 03/06/2020   Sebaceous cyst 12/31/2019   Closed fracture of proximal end of left humerus 12/03/2019   Broken humerus 12/02/2019   Breast wound 10/05/2019   Acquired absence of bilateral breasts and nipples 09/04/2019   Genetic testing 62/94/7654   Monoallelic mutation of CHEK2 gene in female patient 08/07/2019   Malignant neoplasm of upper-outer quadrant of right breast in female, estrogen receptor positive ([place]) 07/30/2019   Goals of care, counseling/discussion 07/30/2019   Family history of prostate cancer    Family history of colon cancer    Breast cancer ([place]) 07/25/2019   Left lower quadrant pain 05/23/2018   Personal history of kidney stones 04/20/2018   Microscopic hematuria 04/20/2018   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 [place], [address] Phone: [phone]   Fax:  [phone]  Name: Tiffany Acevedo MRN: [id number] Date of Birth: 05-27-1965

## 2020-11-11 NOTE — Telephone Encounter (Signed)
Called Ms Putz and reviewed [place]. Per Beckey Rutter NP, no gyn oncology follow up needed and she can resume Tamoxifen. Read back performed.

## 2020-11-12 ENCOUNTER — Encounter: Payer: Self-pay | Admitting: Physical Therapy

## 2020-11-25 ENCOUNTER — Encounter: Payer: Self-pay | Admitting: Physical Therapy

## 2020-11-25 ENCOUNTER — Ambulatory Visit: Payer: BC Managed Care – PPO | Admitting: Physical Therapy

## 2020-11-25 DIAGNOSIS — M25612 Stiffness of left shoulder, not elsewhere classified: Secondary | ICD-10-CM

## 2020-11-25 NOTE — Therapy (Signed)
[facility] [address], [place], [address] Phone: [phone]   Fax:  [phone]  Physical Therapy Treatment Discharge Summary Reporting Period 11/11/20-11/25/20  Patient Details  Name: Tiffany Acevedo MRN: [id number] Date of Birth: 1965-03-13 No data recorded  Encounter Date: 11/25/2020   PT End of Session - 11/25/20 1527     Visit Number 41    Number of Visits 50    Date for PT Re-Evaluation 11/14/20    Authorization - Visit Number 61    Authorization - Number of Visits 100    PT Start Time 67    PT Stop Time 1603    PT Time Calculation (min) 39 min    Activity Tolerance Patient tolerated treatment well    Behavior During Therapy [facility] for tasks assessed/performed             Past Medical History:  Diagnosis Date   Allergy    Anxiety    Breast cancer ([place])    Depression    Excessive weight gain 06/30/2016   Family history of colon cancer    Family history of colon cancer    Family history of prostate cancer    Frequent headaches    GERD (gastroesophageal reflux disease)    History of kidney stones    Hypertension    Migraines    Pre-diabetes     Past Surgical History:  Procedure Laterality Date   BREAST BIOPSY Right 07/13/2019   stereo biopsy/ x clip/path pending   BREAST BIOPSY Right 07/13/2019   stereo biopsy/coil clip/ path pending   BREAST RECONSTRUCTION WITH PLACEMENT OF TISSUE EXPANDER AND FLEX HD (ACELLULAR HYDRATED DERMIS) Bilateral 08/27/2019   Procedure: BILATERAL BREAST RECONSTRUCTION WITH PLACEMENT OF TISSUE EXPANDER AND FLEX HD (ACELLULAR HYDRATED DERMIS);  Surgeon: Wallace Going, DO;  Location: [facility];  Service: Plastics;  Laterality: Bilateral;   [place], 2004, 2013   COLONOSCOPY  2015   CYSTOSCOPY W/ RETROGRADES Right 09/13/2018   Procedure: CYSTOSCOPY WITH RETROGRADE PYELOGRAM;  Surgeon: Abbie Sons, MD;  Location: [facility];   Service: Urology;  Laterality: Right;   CYSTOSCOPY/URETEROSCOPY/HOLMIUM LASER/STENT PLACEMENT Left 05/09/2018   Procedure: CYSTOSCOPY/URETEROSCOPY/HOLMIUM LASER/STENT PLACEMENT;  Surgeon: Abbie Sons, MD;  Location: [facility];  Service: Urology;  Laterality: Left;   CYSTOSCOPY/URETEROSCOPY/HOLMIUM LASER/STENT PLACEMENT Right 09/13/2018   Procedure: CYSTOSCOPY/URETEROSCOPY/HOLMIUM LASER/STENT PLACEMENT;  Surgeon: Abbie Sons, MD;  Location: [facility];  Service: Urology;  Laterality: Right;   INCONTINENCE SURGERY     BLADDER TUCK   LIPOSUCTION WITH LIPOFILLING Bilateral 05/05/2020   Procedure: LIPOSUCTION WITH LIPOFILLING; LIPOSUCTION OF EXCESS LATERAL BREAST TISSUE;  Surgeon: Wallace Going, DO;  Location: [facility];  Service: Plastics;  Laterality: Bilateral;  90 min   PILONIDAL CYST EXCISION     REMOVAL OF BILATERAL TISSUE EXPANDERS WITH PLACEMENT OF BILATERAL BREAST IMPLANTS Bilateral 01/02/2020   Procedure: REMOVAL OF BILATERAL TISSUE EXPANDERS WITH PLACEMENT OF BILATERAL BREAST IMPLANTS;  Surgeon: Wallace Going, DO;  Location: [facility];  Service: Plastics;  Laterality: Bilateral;  2 hours, please   STONE EXTRACTION WITH BASKET Right 09/13/2018   Procedure: STONE EXTRACTION WITH BASKET;  Surgeon: Abbie Sons, MD;  Location: [facility];  Service: Urology;  Laterality: Right;   TOTAL MASTECTOMY Bilateral 08/27/2019   Procedure: TOTAL MASTECTOMY;  Surgeon: Herbert Pun, MD;  Location: [facility];  Service: General;  Laterality: Bilateral;  START _0  DUE TO SENTINEL  NODE   UPPER GI ENDOSCOPY      There were no vitals filed for this visit.   Subjective Assessment - 11/25/20 1639     Subjective Pt denies any shoulder pain at rest and most AROM. She does report some L shoulder stiffness.    Pertinent History Pt is a 55 year old female s/p L proximal humerus fracture 12/02/10. PMH double masectomy 08/27/19 with expander placement and  reconstruction finished 01/03/20.Pt fell in [place] walking straight forward onto her shoulder. Patient reports fracture was non-displaced and she did not have surgery, by Dr. Adrian Prince advisement, wore sling 6 weeks. Currently does not know orthopedic expectations for weight restrictions/movement resistrictions, but has not been lifting more than 1/2 gallon of milk, but is no longer wearing sling. 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 Patient reports motion is very restricted and she is unable to don/doff clothes, her husband washes her hair for her, unable to push/pull, or lift anything. Pt is an avid golfer, and walks often. Worst shoulder pain over the past week 8/10 best 0/10 but reports she avoids painful movement.  Pt is R hand dominant. Pt denies N/V, B&B changes, unexplained weight fluctuation, saddle paresthesia, fever, night sweats, or unrelenting night pain at this time.    Limitations Lifting;House hold activities;Writing    How long can you sit comfortably? unlimited    How long can you stand comfortably? unlimited    How long can you walk comfortably? unlimited    Diagnostic tests Xrays [facility] 12/03/19    Patient Stated Goals Full ROM to complete self care            Therex   TRX flex and abd x12  L Shoulder AROM: Lower Trap Strength: 4+/5   PT reviewed the following HEP with patient with patient able to demonstrate a set of the following with min cuing for correction needed. PT educated patient on parameters of therex (how/when to inc/decrease intensity, frequency, rep/set range, stretch hold time, and purpose of therex) with verbalized understanding.  Y raise 3 x 10 rep with cues to minimize shoulder hiking  Lat Pull down with band 3 x 10 reps  Theraband Row 3 x 10 reps  Prone Y 3 x 6-8 reps      *Pt educated on decreased AROM, improvements in strength, and given HEP.  Pt expressed confidence in ability to  continue strength and ROM  exercise independently at home.                      PT Education - 11/25/20 1527     Education Details Pt given HEP to continue strength and ROM at home    Person(s) Educated Patient    Methods Explanation;Demonstration    Comprehension Verbalized understanding;Returned demonstration              PT Short Term Goals - 11/12/20 1039       PT SHORT TERM GOAL #1   Title Pt will be independent with HEP in order to improve strength and decrease pain in order to improve pain-free function at home and work.    Baseline 01/23/20 HEP given; 05/21/20 completing prior to surgery, HEP updated today; 07/18/20 Completing HEP 50% of the time 09/15/20 completing 50% of the time; 11/12/20 completes current HEP regularly, HEP updated today    Time 4    Period Weeks    Status On-going  PT SHORT TERM GOAL #2   Title Pt will demonstrate full PROM to demonstrate joint mobility needed for for full AROM    Baseline 05/21/20 flex: 143 abd: 105 ER: 54 IR: 72:  07/17/20 flex 154d abd 106 ER 72d IR 76d; 09/15/20 163d; abd 130d ER 82d IR 87d; 11/12/20 ER and IR WNL; Abd and flex approx 160d    Time 4    Period Weeks    Status Partially Met               PT Long Term Goals - 11/25/20 1529       PT LONG TERM GOAL #1   Title Pt will demonstrate full active L shoulder ROM in order to complete self care and overhead ADLS    Baseline 08/65/78 flex 39d abd 39d  ER L ear IR L PSIS; 05/21/20 flex 116d abd 81d  ER C7 IR T12; 07/17/20 flex 134d abd 92d  ER C7 IR T12; 8/8/2 164d; abd 153d; ER CTJ; IR T12; 11/25/20: flex 150, abd 115, ER CTJ, T10    Time 8    Period Weeks    Status Not Met      PT LONG TERM GOAL #2   Title Pt will decrease worst pain as reported on NPRS by at least 3 points in order to demonstrate clinically significant reduction in pain.    Baseline 01/25/20 8/10; 05/21/20 7/10; 07/17/20 4/10    Time 8    Period Weeks    Status Achieved      PT LONG  TERM GOAL #3   Title Pt will demonstrate L gross shoulder strength of 5/5 in order to demonstrate PLOF and strength needed for heavy household tasks    Baseline 01/23/20 flex 2+ abd 2+ ER 2+ IR 2+; 05/21/20 In availible range: flex: 4+ abd: 4- ER: 4+ IR: 4+; 07/17/20 In availible range: flex: 4+ abd: 4- ER: 4+ IR: 4+; 09/15/20 5/5 gross    Period Weeks    Status Achieved      PT LONG TERM GOAL #4   Title Patient will increase FOTO score to 79 to demonstrate predicted increase in functional mobility to complete ADLs    Baseline Initial 28; 09/15/20 69 11/11/20:    Time 8    Period Weeks    Status Achieved      PT LONG TERM GOAL #5   Title Pt will demonstrate 4+ L lower trap strength in order to complete heavy overhead tasks    Baseline 09/15/20 3/5 11/25/20:4+/5    Time 8    Period Weeks    Status Achieved                   Plan - 11/25/20 1647     Clinical Impression Statement Pt discharged this session. Pt has achieved most goals however continues to demonstrate decreased AROM (since progress report). Pt has increased shoulder/periscapular strength and was given an HEP. Pt is independent and has expressed confidence in ability to continue strength and ROM exercises at home. End PT POC for this episode.    Personal Factors and Comorbidities Comorbidity 1;Comorbidity 2;Past/Current Experience;Fitness;Time since onset of injury/illness/exacerbation    Comorbidities breast cancer, HTN, GERD, anxiety    Examination-Activity Limitations Bathing;Lift;Transfers;Reach Overhead;Carry;Dressing    Examination-Participation Restrictions Yard Work;Cleaning;Community Activity;Driving;Occupation;Laundry    Stability/Clinical Decision Making Evolving/Moderate complexity    Clinical Decision Making Moderate    Rehab Potential Good    PT Frequency 2x / week  PT Duration 8 weeks    PT Treatment/Interventions ADLs/Self Care Home Management;Electrical Stimulation;Therapeutic exercise;Balance  training;Joint Manipulations;Taping;Spinal Manipulations;Cryotherapy;Iontophoresis 26m/ml Dexamethasone;DME Instruction;Neuromuscular re-education;Gait training;Stair training;Functional mobility training;Moist Heat;Traction;Ultrasound;Therapeutic activities;Patient/family education;Manual techniques;Dry needling;Passive range of motion;Vestibular    PT Home Exercise Plan D/C with therex in note    Consulted and Agree with Plan of Care Patient             Patient will benefit from skilled therapeutic intervention in order to improve the following deficits and impairments:  Decreased balance, Impaired flexibility, Impaired UE functional use, Hypomobility, Decreased strength, Decreased range of motion, Decreased endurance, Decreased activity tolerance, Postural dysfunction, Increased muscle spasms, Hypermobility, Decreased mobility, Decreased coordination, Abnormal gait, Pain, Improper body mechanics, Increased fascial restricitons  Visit Diagnosis: Stiffness of left shoulder, not elsewhere classified     Problem List Patient Active Problem List   Diagnosis Date Noted   Postmenopausal bleeding 10/29/2020   Breast asymmetry following reconstructive surgery 03/25/2020   S/P breast reconstruction, bilateral 03/06/2020   Sebaceous cyst 12/31/2019   Closed fracture of proximal end of left humerus 12/03/2019   Broken humerus 12/02/2019   Breast wound 10/05/2019   Acquired absence of bilateral breasts and nipples 09/04/2019   Genetic testing 014/23/9532  Monoallelic mutation of CHEK2 gene in female patient 08/07/2019   Malignant neoplasm of upper-outer quadrant of right breast in female, estrogen receptor positive ([place] 07/30/2019   Goals of care, counseling/discussion 07/30/2019   Family history of prostate cancer    Family history of colon cancer    [place] 07/25/2019   Left lower quadrant pain 05/23/2018   Personal history of kidney stones 04/20/2018   Microscopic  hematuria 04/20/2018   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 [place] [address]Phone: [phone]  Fax:  [phone] Name: TTessah PatchenMRN: [id number] of Birth: 1April 24, 1967

## 2020-12-05 ENCOUNTER — Encounter: Payer: Self-pay | Admitting: Plastic Surgery

## 2020-12-05 ENCOUNTER — Other Ambulatory Visit: Payer: Self-pay

## 2020-12-05 ENCOUNTER — Ambulatory Visit (INDEPENDENT_AMBULATORY_CARE_PROVIDER_SITE_OTHER): Payer: BC Managed Care – PPO | Admitting: Plastic Surgery

## 2020-12-05 DIAGNOSIS — Z9889 Other specified postprocedural states: Secondary | ICD-10-CM | POA: Diagnosis not present

## 2020-12-05 NOTE — Progress Notes (Signed)
   Subjective:    Patient ID: Tiffany Acevedo, female    DOB: 1965-02-15, 55 y.o.   MRN: [id number]  The patient is a 55 year old female here for follow-up on her breast reconstruction.  She had her implant exchange November 2021 and fat grafting in March 2022.  She is doing really well.  She is looking good.  She has gained a little bit of weight and so now she is 155 pounds.  She is going to work on getting down to the 140 range.  I do not feel any lumps or bumps in her breasts and her implants are nice and soft and movable.  She had tattooing and has a little irregularity on the left side.   Review of Systems  Constitutional: Negative.   HENT: Negative.    Eyes: Negative.   Respiratory: Negative.    Cardiovascular: Negative.   Gastrointestinal: Negative.   Endocrine: Negative.   Genitourinary: Negative.   Musculoskeletal: Negative.   Skin: Negative.   Hematological: Negative.   Psychiatric/Behavioral: Negative.        Objective:   Physical Exam Vitals and nursing note reviewed.  Constitutional:      Appearance: Normal appearance.  HENT:     Head: Normocephalic and atraumatic.  Cardiovascular:     Rate and Rhythm: Normal rate.     Pulses: Normal pulses.  Pulmonary:     Effort: Pulmonary effort is normal. No respiratory distress.  Abdominal:     General: Abdomen is flat.  Skin:    General: Skin is warm.     Capillary Refill: Capillary refill takes less than 2 seconds.     Coloration: Skin is not jaundiced.  Neurological:     Mental Status: She is alert and oriented to person, place, and time.  Psychiatric:        Mood and Affect: Mood normal.        Behavior: Behavior normal.        Thought Content: Thought content normal.       Assessment & Plan:     ICD-10-CM   1. S/P breast reconstruction, bilateral  Z98.890        We will plan to see her yearly.  I am also going to refer her to the [facility].  I will also refer her to the  PAs for revision of tattoo. Pictures were obtained of the patient and placed in the chart with the patient's or guardian's permission.

## 2020-12-31 ENCOUNTER — Ambulatory Visit (INDEPENDENT_AMBULATORY_CARE_PROVIDER_SITE_OTHER): Payer: BC Managed Care – PPO | Admitting: Physician Assistant

## 2020-12-31 ENCOUNTER — Other Ambulatory Visit: Payer: Self-pay

## 2020-12-31 DIAGNOSIS — Z9013 Acquired absence of bilateral breasts and nipples: Secondary | ICD-10-CM

## 2020-12-31 NOTE — Progress Notes (Signed)
Patient likes the color of tattoo right breast and is satisfied with that cosmetic outcome.  However, left nipple tattoo is much darker in appearance and she is hoping that it could be lighter, similar to nipple on contralateral breast.  She also does not like the stippling border and would like it to be a bit smoother, similar to contralateral tattoo.  As for the lighter colored extension of areola superiorly, she would like that to ultimately be filled in, knowing that the nipple will then be inferiorly located over areola.  Understands that changing nipple position at this time is not realistic and is OK if it is not centered over areola.  Rescheduling for next week. Received verbal and written permission from patient to share photo with colleague for consultation.

## 2021-01-08 ENCOUNTER — Other Ambulatory Visit: Payer: Self-pay

## 2021-01-08 ENCOUNTER — Ambulatory Visit (INDEPENDENT_AMBULATORY_CARE_PROVIDER_SITE_OTHER): Payer: BC Managed Care – PPO | Admitting: Physician Assistant

## 2021-01-08 DIAGNOSIS — Z9013 Acquired absence of bilateral breasts and nipples: Secondary | ICD-10-CM | POA: Diagnosis not present

## 2021-01-08 NOTE — Progress Notes (Signed)
NIPPLE AREOLAR TATTOO PROCEDURE  PREOPERATIVE DIAGNOSIS:  Acquired absence of bilateral nipple areolar.  POSTOPERATIVE DIAGNOSIS: Acquired absence of bilateral nipple areolar    PROCEDURES: Revision of left breast nipple areola tattoo.  ANESTHESIA:  EMLA, 4% Lidocaine with epinephrine  COMPLICATIONS: None.  JUSTIFICATION FOR PROCEDURE:  Tiffany Acevedo is a 55 y.o. female with a history of breast cancer status post breast reconstruction. The patient presents for nipple areolar complex tattoo revision.  Specifically, lightening left nipple areolar.  Risks, benefits, indications, and alternatives of the above described procedures were discussed with the patient and all the patient's questions were answered.   DESCRIPTION OF PROCEDURE: After written informed consent was obtained and proper identification of patient and surgical site was made, the patient was taken to the procedure room and placed supine on the operating room table. A time out was performed to confirm patient's identity and surgical site. The patient was prepped and draped in the usual sterile fashion. Alcohol swabs were used. SofTap Lightning pigment lightener was applied in small sections followed by manual penetration using SofTap #18 and 2.  After all of the hyperpigmented sections were tattooed using the pigment lightener, the area was cleaned with soap and water and then dressed with Xeroform followed by 4 x 4 gauze and secured with Medipore tape. There were no complications and the procedure was tolerated without difficulty.    Areola-color: Lightning - Advertising copywriter.  Patient will expect the area to scab to some degree and then slough off.  She is encouraged not to pick at the scab and instead dress it once daily with Xeroform and gauze.  She will avoid submerging the area in water until it is healed at which point she will follow-up, possibly for additional lightening or other continued revision.

## 2021-01-27 ENCOUNTER — Ambulatory Visit (INDEPENDENT_AMBULATORY_CARE_PROVIDER_SITE_OTHER): Payer: BC Managed Care – PPO | Admitting: Physician Assistant

## 2021-01-27 ENCOUNTER — Other Ambulatory Visit: Payer: Self-pay

## 2021-01-27 DIAGNOSIS — Z9013 Acquired absence of bilateral breasts and nipples: Secondary | ICD-10-CM

## 2021-01-27 NOTE — Progress Notes (Signed)
NIPPLE AREOLAR TATTOO PROCEDURE   PREOPERATIVE DIAGNOSIS:  Acquired absence of bilateral nipple areolar.   POSTOPERATIVE DIAGNOSIS: Acquired absence of bilateral nipple areolar     PROCEDURES: Revision of left breast nipple areola tattoo.   ANESTHESIA:  EMLA, 4% Lidocaine with epinephrine   COMPLICATIONS: None.   JUSTIFICATION FOR PROCEDURE:  Tiffany Acevedo is a 55 y.o. female with a history of breast cancer status post breast reconstruction. The patient presents for nipple areolar complex tattoo revision.  Specifically, lightening left nipple areolar.  Risks, benefits, indications, and alternatives of the above described procedures were discussed with the patient and all the patient's questions were answered.   She was seen for initial lightening treatment on 01/08/2021.  Patient does not see any considerable change since that treatment.  Her preprocedural photo was obtained here today and placed in her chart.  Discussed darkening of the right areola versus additional lightening treatment to left areola.  She again emphasizes that she is quite pleased with the right nipple and simply is hoping that the left areola could be lightened.  She expressed that she would like to proceed with an additional lightening treatment here today.   DESCRIPTION OF PROCEDURE: After written informed consent was obtained and proper identification of patient and surgical site was made, the patient was taken to the procedure room and placed supine on the operating room table. A time out was performed to confirm patient's identity and surgical site. The patient was prepped and draped in the usual sterile fashion. Alcohol swabs were used. SofTap Lightning pigment lightener was applied in small sections followed by manual penetration using #7 Digital Revo.  After all of the hyperpigmented sections were tattooed using the pigment lightener, the area was cleaned with soap and water and then dressed with Xeroform followed by 4 x  4 gauze and secured with Medipore tape. There were no complications and the procedure was tolerated without difficulty.     Areola-color: Lightning - Advertising copywriter.   Patient will expect the area to scab to some degree and then slough off.  She is encouraged not to pick at the scab and instead dress it once daily with Xeroform and gauze.  She will avoid submerging the area in water until it is healed at which point she will follow-up, possibly for additional lightening or other continued revision.    In 4 weeks, asked that she upload a photo to [place].  At that time, I will call her discuss plans for either repeat lightning procedure versus darkening of contralateral areola for symmetry.

## 2021-03-20 ENCOUNTER — Other Ambulatory Visit: Payer: Self-pay

## 2021-03-20 ENCOUNTER — Inpatient Hospital Stay: Payer: BC Managed Care – PPO | Attending: Oncology | Admitting: Oncology

## 2021-03-20 VITALS — BP 134/63 | HR 74 | Temp 97.9°F | Resp 17 | Wt 162.0 lb

## 2021-03-20 DIAGNOSIS — Z9013 Acquired absence of bilateral breasts and nipples: Secondary | ICD-10-CM | POA: Insufficient documentation

## 2021-03-20 DIAGNOSIS — Z08 Encounter for follow-up examination after completed treatment for malignant neoplasm: Secondary | ICD-10-CM | POA: Diagnosis not present

## 2021-03-20 DIAGNOSIS — Z853 Personal history of malignant neoplasm of breast: Secondary | ICD-10-CM | POA: Diagnosis not present

## 2021-03-20 DIAGNOSIS — C50911 Malignant neoplasm of unspecified site of right female breast: Secondary | ICD-10-CM | POA: Diagnosis present

## 2021-03-20 DIAGNOSIS — Z170 Estrogen receptor positive status [ER+]: Secondary | ICD-10-CM | POA: Insufficient documentation

## 2021-03-20 DIAGNOSIS — Z5181 Encounter for therapeutic drug level monitoring: Secondary | ICD-10-CM | POA: Diagnosis not present

## 2021-03-20 DIAGNOSIS — Z79810 Long term (current) use of selective estrogen receptor modulators (SERMs): Secondary | ICD-10-CM | POA: Diagnosis not present

## 2021-03-20 NOTE — Progress Notes (Signed)
6 month follow up.Taking AI, does have hot flashes recently less intense. She is tolerating okay. Energy normal. Appetite is good.

## 2021-03-21 NOTE — Progress Notes (Signed)
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  This was followed by diagnostic mammogram which showed numerous groups of calcifications spanning at least 6.4 cm.  She had a biopsy of the medialmost and lateralmost calcification.  The medialmost calcification showed invasive mammary carcinoma grade 1 ER/PR positive and HER-2 negative.  The lateralmost calcification was negative for atypia and malignancy and showed fibrocystic changes.  Patient has met with Dr. Peyton Najjar and is leaning towards at least a unilateral mastectomy.  She also underwent bilateral MRI which did not show any enhancement even at the site of biopsy-proven malignancy or elsewhere.  No MRI evidence of malignancy in the left breast.  Patient has not had any menstrual cycle since May 2021 right before her surgery.   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  3 sentinel lymph nodes negative for  malignancy.  No evidence of malignancy or DCIS in the left breast.  Tamoxifen started in late December 2021  Interval history- patint is tolerating tamoxifen. She had a one odd menstrual cycle which lasted for about 2 weeks then stopped. She saw gyn onc and had pelvic usg which was normal. Hot flashes are self limited  ECOG PS- 0 Pain scale- 0   Review of systems- Review of Systems  Constitutional:  Negative for chills, fever, malaise/fatigue and weight loss.  HENT:  Negative for congestion, ear discharge and nosebleeds.   Eyes:  Negative for blurred vision.  Respiratory:  Negative for cough, hemoptysis, sputum production, shortness of breath and wheezing.   Cardiovascular:  Negative for chest pain, palpitations, orthopnea and claudication.  Gastrointestinal:  Negative for abdominal pain, blood in stool, constipation, diarrhea, heartburn, melena, nausea and vomiting.  Genitourinary:  Negative for dysuria, flank pain, frequency, hematuria and urgency.  Musculoskeletal:  Negative for back pain, joint pain and myalgias.  Skin:  Negative for rash.  Neurological:  Negative for dizziness, tingling, focal weakness, seizures, weakness and headaches.  Endo/Heme/Allergies:  Does not bruise/bleed easily.       Hot flashes  Psychiatric/Behavioral:  Negative for depression and suicidal ideas. The patient does not have insomnia.       Allergies  Allergen Reactions   Oxycodone-Acetaminophen    Percocet [Oxycodone-Acetaminophen] Other (See Comments)    Arms and legs go numb   Sulfa Antibiotics Nausea And Vomiting     Past Medical History:  Diagnosis Date   Allergy    Anxiety    Breast cancer ([place])    Depression    Excessive weight gain 06/30/2016   Family history of colon cancer    Family  history of colon cancer    Family history of prostate cancer    Frequent headaches    GERD (gastroesophageal reflux disease)    History of kidney stones    Hypertension    Migraines    Pre-diabetes       Past Surgical History:  Procedure Laterality Date   BREAST BIOPSY Right 07/13/2019   stereo biopsy/ x clip/path pending   BREAST BIOPSY Right 07/13/2019   stereo biopsy/coil clip/ path pending   BREAST RECONSTRUCTION WITH PLACEMENT OF TISSUE EXPANDER AND FLEX HD (ACELLULAR HYDRATED DERMIS) Bilateral 08/27/2019   Procedure: BILATERAL BREAST RECONSTRUCTION WITH PLACEMENT OF TISSUE EXPANDER AND FLEX HD (ACELLULAR HYDRATED DERMIS);  Surgeon: Wallace Going, DO;  Location: [facility];  Service: Plastics;  Laterality: Bilateral;   [place], 2004, 2013   COLONOSCOPY  2015   CYSTOSCOPY W/ RETROGRADES Right 09/13/2018   Procedure: CYSTOSCOPY WITH RETROGRADE PYELOGRAM;  Surgeon: Abbie Sons, MD;  Location: [facility];  Service: Urology;  Laterality: Right;   CYSTOSCOPY/URETEROSCOPY/HOLMIUM LASER/STENT PLACEMENT Left 05/09/2018   Procedure: CYSTOSCOPY/URETEROSCOPY/HOLMIUM LASER/STENT PLACEMENT;  Surgeon: Abbie Sons, MD;  Location: [facility];  Service: Urology;  Laterality: Left;   CYSTOSCOPY/URETEROSCOPY/HOLMIUM LASER/STENT PLACEMENT Right 09/13/2018   Procedure: CYSTOSCOPY/URETEROSCOPY/HOLMIUM LASER/STENT PLACEMENT;  Surgeon: Abbie Sons, MD;  Location: [facility];  Service: Urology;  Laterality: Right;   INCONTINENCE SURGERY     BLADDER TUCK   LIPOSUCTION WITH LIPOFILLING Bilateral 05/05/2020   Procedure: LIPOSUCTION WITH LIPOFILLING; LIPOSUCTION OF EXCESS LATERAL BREAST TISSUE;  Surgeon: Wallace Going, DO;  Location: [facility];  Service: Plastics;  Laterality: Bilateral;  90 min   PILONIDAL CYST EXCISION     REMOVAL OF BILATERAL TISSUE EXPANDERS WITH PLACEMENT OF BILATERAL BREAST IMPLANTS Bilateral 01/02/2020   Procedure: REMOVAL OF BILATERAL TISSUE EXPANDERS WITH PLACEMENT OF BILATERAL BREAST IMPLANTS;  Surgeon: Wallace Going, DO;  Location: [place];  Service: Plastics;  Laterality: Bilateral;  2 hours, please   STONE  EXTRACTION WITH BASKET Right 09/13/2018   Procedure: STONE EXTRACTION WITH BASKET;  Surgeon: Abbie Sons, MD;  Location: [facility];  Service: Urology;  Laterality: Right;   TOTAL MASTECTOMY Bilateral 08/27/2019   Procedure: TOTAL MASTECTOMY;  Surgeon: Herbert Pun, MD;  Location: [facility];  Service: General;  Laterality: Bilateral;  START _0  DUE TO SENTINEL NODE   UPPER GI ENDOSCOPY      Social History   Socioeconomic History   Marital status: Married    Spouse name: Not on file   Number of children: Not on file   Years of education: Not on file   Highest education level: Not on file  Occupational History   Not on file  Tobacco Use   Smoking status: Never   Smokeless tobacco: Never  Vaping Use   Vaping Use: Never used  Substance and Sexual Activity   Alcohol use: Yes    Comment: glass of wine occassionally   Drug use: No   Sexual activity: Yes    Partners: Male  Other Topics Concern   Not on file  Social History Narrative   Not on file   Social Determinants of Health   Financial Resource Strain: Not on file  Food Insecurity: Not on file  Transportation Needs: Not on file  Physical Activity: Not on file  Stress: Not on file  Social Connections: Not on file  Intimate Partner Violence: Not on file    Family History  Problem Relation Age of  Onset   Hyperlipidemia Mother    Hypertension Mother    Depression Mother    Colon cancer Mother 37   Alcohol abuse Father    Hyperlipidemia Father    Stroke Father    Hypertension Father    Depression Father    Heart disease Father    Prostate cancer Father 93   Arthritis Maternal Grandmother    Hypertension Maternal Grandmother    Depression Maternal Grandmother    Arthritis Maternal Grandfather    Stroke Maternal Grandfather    Hypertension Maternal Grandfather    Depression Maternal Grandfather    Hypertension Paternal Grandmother    Depression Paternal Grandmother    Hypertension Paternal Grandfather     Depression Paternal Grandfather    Leukemia Maternal Aunt        d <50   Cancer Paternal Aunt        unsure types     Current Outpatient Medications:    Ascorbic Acid (VITAMIN C) 100 MG tablet, Take 500 mg by mouth daily., Disp: , Rfl:    Calcium Carbonate-Vit D-Min (CALCIUM 1200 PO), Take 1 tablet by mouth daily. , Disp: , Rfl:    Fremanezumab-vfrm (AJOVY) 225 MG/1.5ML SOAJ, Inject into the skin., Disp: , Rfl:    hydrochlorothiazide (MICROZIDE) 12.5 MG capsule, Take 1 capsule (12.5 mg total) by mouth daily., Disp: 30 capsule, Rfl: 2   ibuprofen (ADVIL,MOTRIN) 200 MG tablet, Take 400 mg by mouth every 6 (six) hours as needed for headache or moderate pain. , Disp: , Rfl:    Ibuprofen-diphenhydrAMINE Cit (MOTRIN PM PO), Take 1 tablet by mouth at bedtime., Disp: , Rfl:    imipramine (TOFRANIL) 50 MG tablet, Take 50 mg by mouth at bedtime. , Disp: , Rfl:    metoprolol succinate (TOPROL-XL) 100 MG 24 hr tablet, Take 25 mg by mouth daily., Disp: , Rfl:    Multiple Vitamin (MULTIVITAMIN PO), Take 1 tablet by mouth daily., Disp: , Rfl:    omeprazole (PRILOSEC) 20 MG capsule, Take 20 mg by mouth daily. , Disp: , Rfl:    rizatriptan (MAXALT) 10 MG tablet, Take 10 mg by mouth as needed for migraine. , Disp: , Rfl:    tamoxifen (NOLVADEX) 20 MG tablet, TAKE ONE TABLET BY MOUTH EVERY DAY, Disp: 30 tablet, Rfl: 6   venlafaxine XR (EFFEXOR-XR) 150 MG 24 hr capsule, TAKE 1 CAPSULE BY MOUTH EVERY DAY WITH BREAKFAST (Patient taking differently: Take 150 mg by mouth daily with breakfast.), Disp: 30 capsule, Rfl: 5   Zinc 22.5 MG TABS, Take 1 tablet by mouth daily., Disp: , Rfl:   Physical exam:  Vitals:   03/20/21 1029  BP: 134/63  Pulse: 74  Resp: 17  Temp: 97.9 F (36.6 C)  TempSrc: Tympanic  SpO2: 100%  Weight: 162 lb (73.5 kg)   Physical Exam Constitutional:      General: She is not in acute distress. Eyes:     Pupils: Pupils are equal, round, and reactive to light.  Cardiovascular:      Rate and Rhythm: Normal rate and regular rhythm.  Pulmonary:     Effort: Pulmonary effort is normal.  Abdominal:     General: Bowel sounds are normal.     Palpations: Abdomen is soft.  Skin:    General: Skin is warm and dry.  Neurological:     Mental Status: She is alert and oriented to person, place, and time.   Chest wall exam: patint is s/p b/l mastectomy with reconstruction.  No palpable b/l axillay adenopathy. No palpable breast masses  CMP Latest Ref Rng & Units 05/05/2020  Glucose 70 - 99 mg/dL 109(H)  BUN 6 - 20 mg/dL 14  Creatinine 0.44 - 1.00 mg/dL 0.86  Sodium 135 - 145 mmol/L 141  Potassium 3.5 - 5.1 mmol/L 3.8  Chloride 98 - 111 mmol/L 106  CO2 22 - 32 mmol/L 27  Calcium 8.9 - 10.3 mg/dL 9.3  Total Protein 6.5 - 8.1 g/dL -  Total Bilirubin 0.3 - 1.2 mg/dL -  Alkaline Phos 38 - 126 U/L -  AST 15 - 41 U/L -  ALT 0 - 44 U/L -   CBC Latest Ref Rng & Units 09/14/2018  WBC 4.0 - 10.5 K/uL 17.7(H)  Hemoglobin 12.0 - 15.0 g/dL 12.8  Hematocrit 36.0 - 46.0 % 38.9  Platelets 150 - 400 K/uL 257     Assessment and plan- Patient is a 56 y.o. female  with CHEK2 mutation and pathological prognostic stage Ia invasive mammary carcinoma of the right breast ER/PR positive HER2 negative pT1 apN0 cM0 status post bilateral mastectomy.  This is a routine follow-up visit for breast cancer  Clinically patient is doing well with no concerning signs and symptoms of recurrence on today's exam.  She is s/p bilateral mastectomy and therefore does not require any surveillanceMammograms.  Patient has been on tamoxifen for about a year now and tolerating it well without any significant side effects.  Hot flashes are self-limited.  Patient did have a normal menstrual cycle which lasted for about 2 weeks ago and underwent a pelvic ultrasound which was otherwise unremarkable and was seen by GYN oncology.  Continue tamoxifen at this time and I will see her back in 6 months for routine exam.   Visit  Diagnosis 1. Encounter for monitoring tamoxifen therapy   2. Encounter for follow-up surveillance of breast cancer      Dr. Randa Evens, MD, MPH [facility] [phone] 03/21/2021 10:18 AM

## 2021-03-22 ENCOUNTER — Encounter: Payer: Self-pay | Admitting: Oncology

## 2021-05-03 IMAGING — MG MM BREAST BX W/ LOC DEV 1ST LESION IMAGE BX SPEC STEREO GUIDE*R*
7 of 10 series · 7 of 22 positions shown · non-contrast
Comparison: Previous exams.
COMPARISON: Previous exams.

Addendum:
CLINICAL DATA: Indeterminate calcifications in the right breast.

EXAM:
RIGHT BREAST STEREOTACTIC CORE NEEDLE BIOPSIES

[R (1 of 6)]
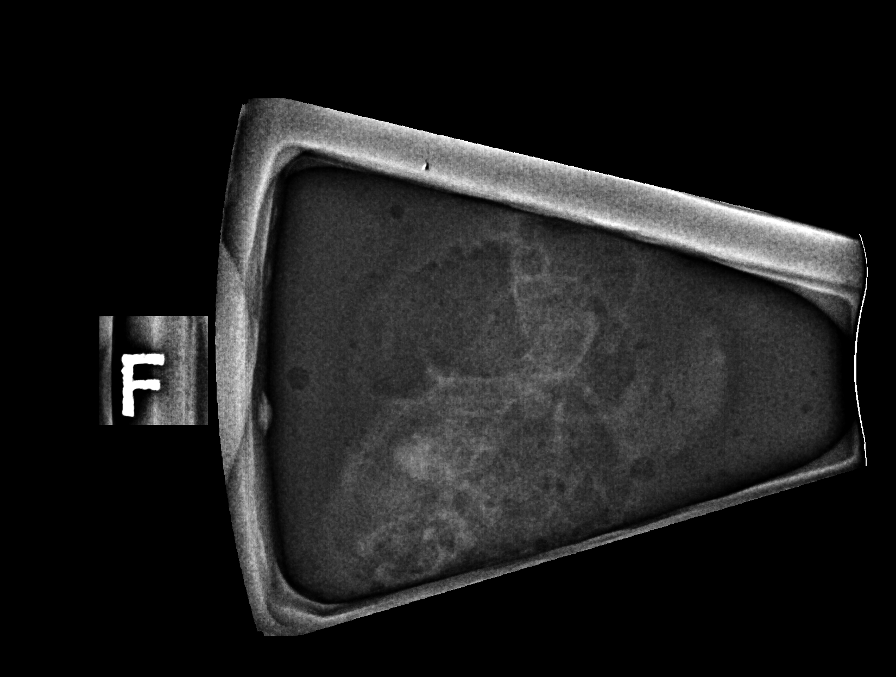

[R (2 of 6)]
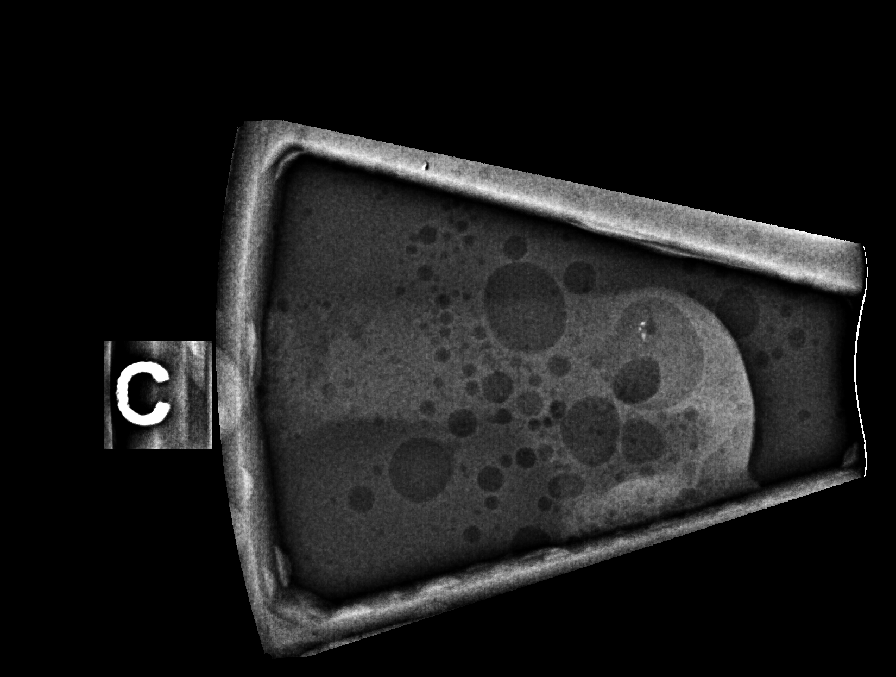

[R (3 of 6)]
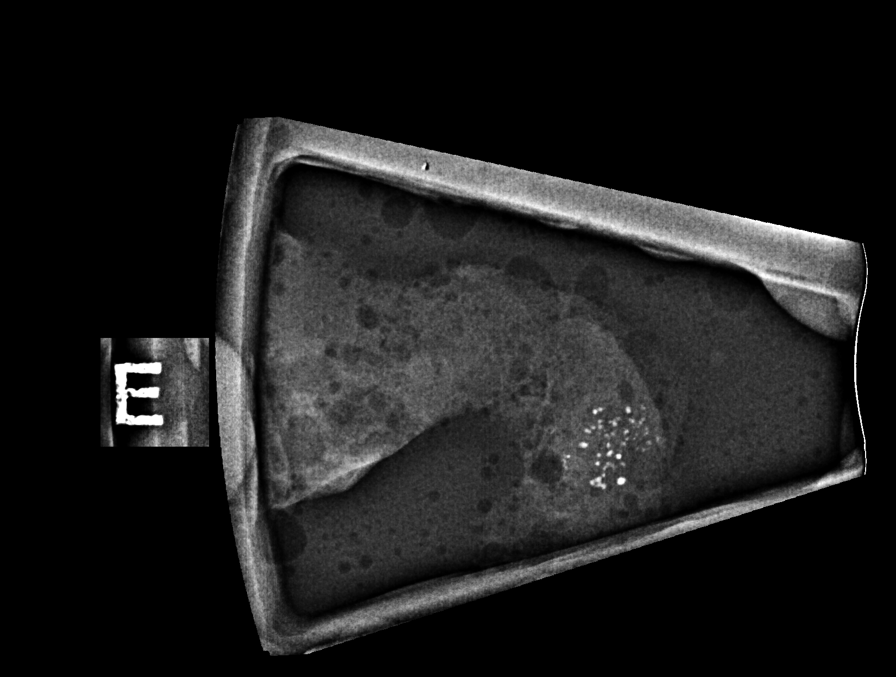

[R (4 of 6)]
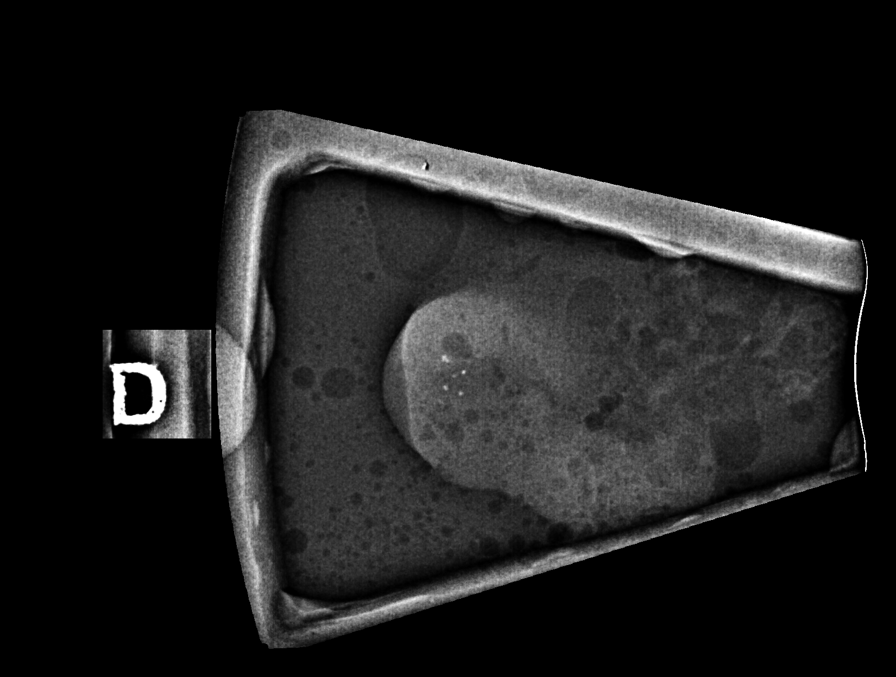

[R (5 of 6)]
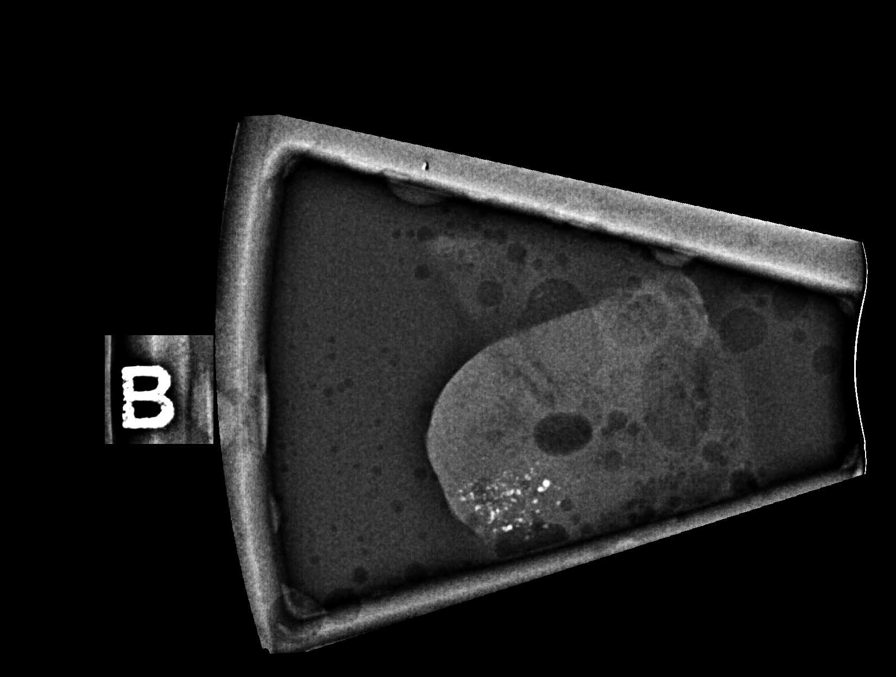

[R (6 of 6)]
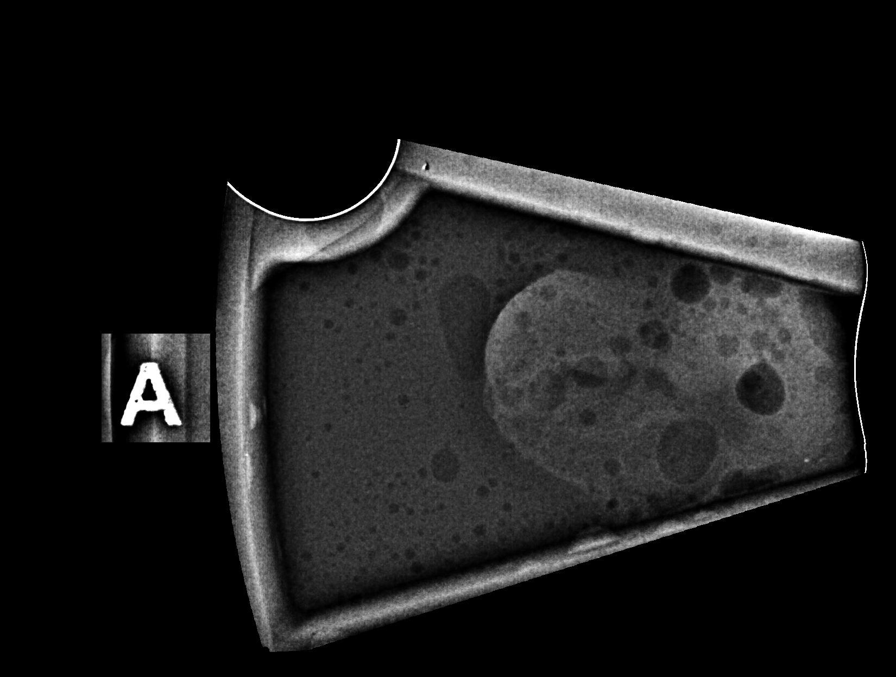

[R CC]
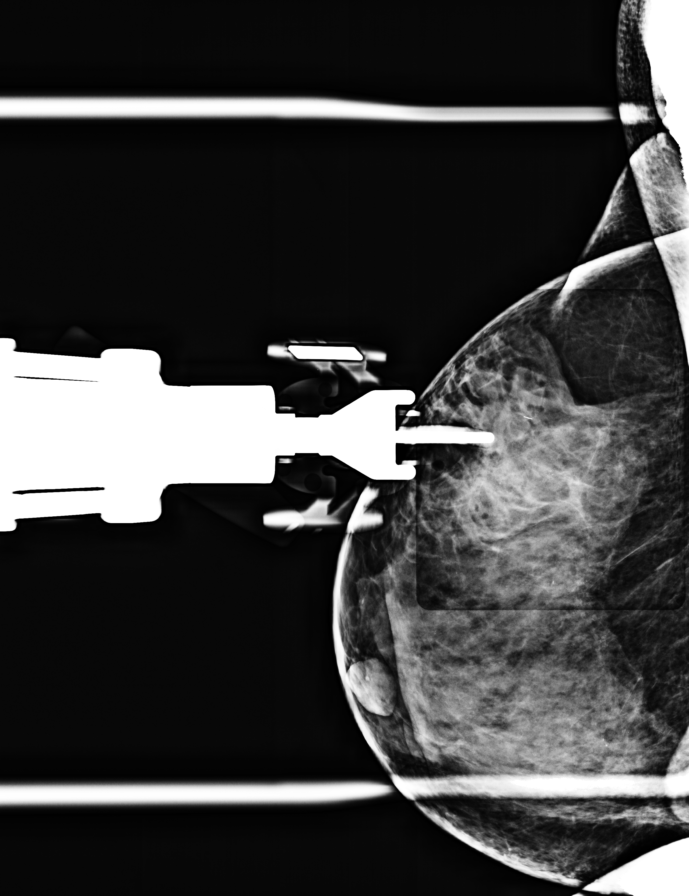

[7 of 22 positions shown; findings below may reference images not displayed]



Using sterile technique and 1% lidocaine and 1% lidocaine with
epinephrine as local anesthetic, under stereotactic guidance, a 9
gauge vacuum assisted device was used to perform core needle biopsy
of calcifications in the lateral aspect of the right breast using a
superior to inferior approach. Specimen radiograph was performed
showing calcifications are present in the tissue samples. Specimens
with calcifications are identified for pathology.

Lesion quadrant: Lateral

At the conclusion of the procedure, X shaped tissue marker clip was
deployed into the biopsy cavity. Follow-up 2-view mammogram was
performed and dictated separately.

The patient and I discussed the procedure of stereotactic-guided
biopsy including benefits and alternatives. We discussed the high
likelihood of a successful procedure. We discussed the risks of the
procedure including infection, bleeding, tissue injury, clip
migration, and inadequate sampling. Informed written consent was
given. The usual time out protocol was performed immediately prior
to the procedure.

Using sterile technique and 1% lidocaine and 1% lidocaine with
epinephrine as local anesthetic, under stereotactic guidance, a 9
gauge vacuum assisted device was used to perform core needle biopsy
of calcifications in the medial aspect of the right breast using a
superior to inferior approach. Specimen radiograph was performed
showing NO CALCIFICATIONS ARE PRESENT IN THE TISSUE SAMPLES.
Specimens with calcifications are identified for pathology.

Lesion quadrant: Medial

At the conclusion of the procedure, coil shaped tissue marker clip
was deployed into the biopsy cavity. Follow-up 2-view mammogram was
performed and dictated separately.
IMPRESSION: Stereotactic-guided biopsies of the right breast. SPECIMEN
RADIOGRAPH SHOWED CALCIFICATIONS IN THE LATERAL ASPECT OF THE BREAST
AND NO CALCIFICATIONS IN THE MEDIAL ASPECT OF THE BREAST. No
apparent complications.

ADDENDUM:
PATHOLOGY revealed:

A. BREAST, RIGHT LATERAL; STEREOTACTIC BIOPSY: - FIBROADENOMATOID
AND FIBROCYSTIC CHANGES WITH ASSOCIATED CALCIFICATIONS. - NEGATIVE
FOR ATYPIA AND MALIGNANCY.

B. BREAST, RIGHT MEDIAL; STEREOTACTIC BIOPSY: - INVASIVE MAMMARY
CARCINOMA, NO SPECIAL TYPE. 2 mm in this sample. Grade 1. Ductal
carcinoma in situ: Not identified. Lymphovascular invasion: Not
identified.

Pathology results are CONCORDANT with imaging findings, per Dr. Dutan
Jaylon.

Pathology results and recommendations below were discussed with
patient by telephone on 07/18/2019. Patient reported biopsy site doing
well with slight tenderness at the site. Post biopsy care
instructions were reviewed and questions were answered. Patient was
instructed to call [HOSPITAL] if any concerns or
questions arise related to the biopsy.

Recommendation: Surgical referral. Request for surgical referral was
relayed to Naimad Celiz RN and Motivacioni Rejho RN at [HOSPITAL]
[HOSPITAL] by Johnsen Obeng RN on 07/18/2019.

BREAST MRI AND ADDITIONAL STEREOTACTIC BIOPSY OF CENTRAL
CALCIFICATIONS IS RECOMMENDED FOR EXTENT OF DISEASE.

Addendum by Johnsen Obeng RN on 07/18/2019.



Using sterile technique and 1% lidocaine and 1% lidocaine with
epinephrine as local anesthetic, under stereotactic guidance, a 9
gauge vacuum assisted device was used to perform core needle biopsy
of calcifications in the lateral aspect of the right breast using a
superior to inferior approach. Specimen radiograph was performed
showing calcifications are present in the tissue samples. Specimens
with calcifications are identified for pathology.

Lesion quadrant: Lateral

At the conclusion of the procedure, X shaped tissue marker clip was
deployed into the biopsy cavity. Follow-up 2-view mammogram was
performed and dictated separately.

The patient and I discussed the procedure of stereotactic-guided
biopsy including benefits and alternatives. We discussed the high
likelihood of a successful procedure. We discussed the risks of the
procedure including infection, bleeding, tissue injury, clip
migration, and inadequate sampling. Informed written consent was
given. The usual time out protocol was performed immediately prior
to the procedure.

Using sterile technique and 1% lidocaine and 1% lidocaine with
epinephrine as local anesthetic, under stereotactic guidance, a 9
gauge vacuum assisted device was used to perform core needle biopsy
of calcifications in the medial aspect of the right breast using a
superior to inferior approach. Specimen radiograph was performed
showing NO CALCIFICATIONS ARE PRESENT IN THE TISSUE SAMPLES.
Specimens with calcifications are identified for pathology.

Lesion quadrant: Medial

At the conclusion of the procedure, coil shaped tissue marker clip
was deployed into the biopsy cavity. Follow-up 2-view mammogram was
performed and dictated separately.
IMPRESSION: Stereotactic-guided biopsies of the right breast. SPECIMEN
RADIOGRAPH SHOWED CALCIFICATIONS IN THE LATERAL ASPECT OF THE BREAST
AND NO CALCIFICATIONS IN THE MEDIAL ASPECT OF THE BREAST. No
apparent complications.

## 2021-05-21 ENCOUNTER — Other Ambulatory Visit: Payer: Self-pay | Admitting: Oncology

## 2021-06-05 ENCOUNTER — Other Ambulatory Visit: Payer: Self-pay | Admitting: General Surgery

## 2021-06-05 DIAGNOSIS — Z853 Personal history of malignant neoplasm of breast: Secondary | ICD-10-CM

## 2021-06-26 ENCOUNTER — Other Ambulatory Visit: Payer: Self-pay | Admitting: Physician Assistant

## 2021-06-26 DIAGNOSIS — R7989 Other specified abnormal findings of blood chemistry: Secondary | ICD-10-CM

## 2021-07-03 ENCOUNTER — Ambulatory Visit
Admission: RE | Admit: 2021-07-03 | Discharge: 2021-07-03 | Disposition: A | Discharge status: Home / Self Care | Payer: BC Managed Care – PPO | Source: Ambulatory Visit | Attending: Physician Assistant | Admitting: Physician Assistant

## 2021-07-03 DIAGNOSIS — R7989 Other specified abnormal findings of blood chemistry: Secondary | ICD-10-CM | POA: Diagnosis present

## 2021-07-15 ENCOUNTER — Other Ambulatory Visit: Payer: BC Managed Care – PPO

## 2021-10-02 ENCOUNTER — Inpatient Hospital Stay: Payer: BC Managed Care – PPO | Attending: Oncology | Admitting: Oncology

## 2021-10-02 ENCOUNTER — Encounter: Payer: Self-pay | Admitting: Oncology

## 2021-10-02 VITALS — Wt 165.3 lb

## 2021-10-02 DIAGNOSIS — Z853 Personal history of malignant neoplasm of breast: Secondary | ICD-10-CM

## 2021-10-02 DIAGNOSIS — C50411 Malignant neoplasm of upper-outer quadrant of right female breast: Secondary | ICD-10-CM

## 2021-10-02 DIAGNOSIS — Z79899 Other long term (current) drug therapy: Secondary | ICD-10-CM | POA: Diagnosis not present

## 2021-10-02 DIAGNOSIS — Z800 Family history of malignant neoplasm of digestive organs: Secondary | ICD-10-CM | POA: Diagnosis not present

## 2021-10-02 DIAGNOSIS — Z79810 Long term (current) use of selective estrogen receptor modulators (SERMs): Secondary | ICD-10-CM

## 2021-10-02 DIAGNOSIS — Z1509 Genetic susceptibility to other malignant neoplasm: Secondary | ICD-10-CM | POA: Insufficient documentation

## 2021-10-02 DIAGNOSIS — Z9013 Acquired absence of bilateral breasts and nipples: Secondary | ICD-10-CM | POA: Diagnosis not present

## 2021-10-02 DIAGNOSIS — Z5181 Encounter for therapeutic drug level monitoring: Secondary | ICD-10-CM | POA: Diagnosis not present

## 2021-10-02 DIAGNOSIS — C50911 Malignant neoplasm of unspecified site of right female breast: Secondary | ICD-10-CM | POA: Diagnosis not present

## 2021-10-02 DIAGNOSIS — Z806 Family history of leukemia: Secondary | ICD-10-CM | POA: Insufficient documentation

## 2021-10-02 DIAGNOSIS — Z170 Estrogen receptor positive status [ER+]: Secondary | ICD-10-CM | POA: Insufficient documentation

## 2021-10-02 DIAGNOSIS — Z08 Encounter for follow-up examination after completed treatment for malignant neoplasm: Secondary | ICD-10-CM | POA: Diagnosis not present

## 2021-10-02 NOTE — Progress Notes (Signed)
Rt leg aches especially at nighttime but able to tolerate the discomfort. Has had restless leg before so states it is not that;

## 2021-10-12 NOTE — Progress Notes (Signed)
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  This was followed by diagnostic mammogram which showed numerous groups of calcifications spanning at least 6.4 cm.  She had a biopsy of the medialmost and lateralmost calcification.  The medialmost calcification showed invasive mammary carcinoma grade 1 ER/PR positive and HER-2 negative.  The lateralmost calcification was negative for atypia and malignancy and showed fibrocystic changes.  Patient has met with Dr. Peyton Najjar and is leaning towards at least a unilateral mastectomy.  She also underwent bilateral MRI which did not show any enhancement even at the site of biopsy-proven malignancy or elsewhere.  No MRI evidence of malignancy in the left breast.  Patient has not had any menstrual cycle since May 2021 right before her surgery.   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  3 sentinel lymph nodes  negative for malignancy.  No evidence of malignancy or DCIS in the left breast.  Tamoxifen started in late December 2021    Interval history-patient does get hot flashes after starting tamoxifen but states that they are tolerable presently.  Reports some right lower extremity pain at night but denies any symptoms of restless leg syndrome  ECOG PS- 0 Pain scale- 0   Review of systems- Review of Systems  Constitutional:  Negative for chills, fever, malaise/fatigue and weight loss.  HENT:  Negative for congestion, ear discharge and nosebleeds.   Eyes:  Negative for blurred vision.  Respiratory:  Negative for cough, hemoptysis, sputum production, shortness of breath and wheezing.   Cardiovascular:  Negative for chest pain, palpitations, orthopnea and claudication.  Gastrointestinal:  Negative for abdominal pain, blood in stool, constipation, diarrhea, heartburn, melena, nausea and vomiting.  Genitourinary:  Negative for dysuria, flank pain, frequency, hematuria and urgency.  Musculoskeletal:  Negative for back pain, joint pain and myalgias.  Skin:  Negative for rash.  Neurological:  Negative for dizziness, tingling, focal weakness, seizures, weakness and headaches.  Endo/Heme/Allergies:  Does not bruise/bleed easily.       Hot flashes  Psychiatric/Behavioral:  Negative for depression and suicidal ideas. The patient does not have insomnia.       Allergies  Allergen Reactions   Oxycodone-Acetaminophen    Percocet [Oxycodone-Acetaminophen] Other (See Comments)    Arms and legs go numb   Sulfa Antibiotics Nausea And Vomiting     Past Medical History:  Diagnosis Date   Allergy    Anxiety    Breast cancer ([place])    Depression    Excessive weight gain 06/30/2016   Family history of colon cancer    Family history of  colon cancer    Family history of prostate cancer    Frequent headaches    GERD (gastroesophageal reflux disease)    History of kidney stones    Hypertension     Migraines    Pre-diabetes      Past Surgical History:  Procedure Laterality Date   BREAST BIOPSY Right 07/13/2019   stereo biopsy/ x clip/path pending   BREAST BIOPSY Right 07/13/2019   stereo biopsy/coil clip/ path pending   BREAST RECONSTRUCTION WITH PLACEMENT OF TISSUE EXPANDER AND FLEX HD (ACELLULAR HYDRATED DERMIS) Bilateral 08/27/2019   Procedure: BILATERAL BREAST RECONSTRUCTION WITH PLACEMENT OF TISSUE EXPANDER AND FLEX HD (ACELLULAR HYDRATED DERMIS);  Surgeon: Wallace Going, DO;  Location: [facility];  Service: Plastics;  Laterality: Bilateral;   [place], 2004, 2013   COLONOSCOPY  2015   CYSTOSCOPY W/ RETROGRADES Right 09/13/2018   Procedure: CYSTOSCOPY WITH RETROGRADE PYELOGRAM;  Surgeon: Abbie Sons, MD;  Location: [facility];  Service: Urology;  Laterality: Right;   CYSTOSCOPY/URETEROSCOPY/HOLMIUM LASER/STENT PLACEMENT Left 05/09/2018   Procedure: CYSTOSCOPY/URETEROSCOPY/HOLMIUM LASER/STENT PLACEMENT;  Surgeon: Abbie Sons, MD;  Location: [facility];  Service: Urology;  Laterality: Left;   CYSTOSCOPY/URETEROSCOPY/HOLMIUM LASER/STENT PLACEMENT Right 09/13/2018   Procedure: CYSTOSCOPY/URETEROSCOPY/HOLMIUM LASER/STENT PLACEMENT;  Surgeon: Abbie Sons, MD;  Location: [facility];  Service: Urology;  Laterality: Right;   INCONTINENCE SURGERY     BLADDER TUCK   LIPOSUCTION WITH LIPOFILLING Bilateral 05/05/2020   Procedure: LIPOSUCTION WITH LIPOFILLING; LIPOSUCTION OF EXCESS LATERAL BREAST TISSUE;  Surgeon: Wallace Going, DO;  Location: [place];  Service: Plastics;  Laterality: Bilateral;  90 min   PILONIDAL CYST EXCISION     REMOVAL OF BILATERAL TISSUE EXPANDERS WITH PLACEMENT OF BILATERAL BREAST IMPLANTS Bilateral 01/02/2020   Procedure: REMOVAL OF BILATERAL TISSUE EXPANDERS WITH PLACEMENT OF BILATERAL BREAST IMPLANTS;  Surgeon: Wallace Going, DO;  Location: [place];  Service: Plastics;  Laterality: Bilateral;  2  hours, please   STONE EXTRACTION WITH BASKET Right 09/13/2018   Procedure: STONE EXTRACTION WITH BASKET;  Surgeon: Abbie Sons, MD;  Location: [facility];  Service: Urology;  Laterality: Right;   TOTAL MASTECTOMY Bilateral 08/27/2019   Procedure: TOTAL MASTECTOMY;  Surgeon: Herbert Pun, MD;  Location: [facility];  Service: General;  Laterality: Bilateral;  START @930  DUE TO SENTINEL NODE   UPPER GI ENDOSCOPY      Social History   Socioeconomic History   Marital status: Married    Spouse name: Not on file   Number of children: Not on file   Years of education: Not on file   Highest education level: Not on file  Occupational History   Not on file  Tobacco Use   Smoking status: Never   Smokeless tobacco: Never  Vaping Use   Vaping Use: Never used  Substance and Sexual Activity   Alcohol use: Yes    Comment: glass of wine occassionally   Drug use: No   Sexual activity: Yes    Partners: Male  Other Topics Concern   Not on file  Social History Narrative   Not on file   Social Determinants of Health   Financial Resource Strain: Not on file  Food Insecurity: Not on file  Transportation Needs: Not on file  Physical Activity: Not on file  Stress: Not on file  Social Connections: Not on file  Intimate Partner Violence: Not on file    Family History  Problem Relation Age of Onset  Hyperlipidemia Mother    Hypertension Mother    Depression Mother    Colon cancer Mother 108   Alcohol abuse Father    Hyperlipidemia Father    Stroke Father    Hypertension Father    Depression Father    Heart disease Father    Prostate cancer Father 52   Arthritis Maternal Grandmother    Hypertension Maternal Grandmother    Depression Maternal Grandmother    Arthritis Maternal Grandfather    Stroke Maternal Grandfather    Hypertension Maternal Grandfather    Depression Maternal Grandfather    Hypertension Paternal Grandmother    Depression Paternal Grandmother    Hypertension  Paternal Grandfather    Depression Paternal Grandfather    Leukemia Maternal Aunt        d <50   Cancer Paternal Aunt        unsure types     Current Outpatient Medications:    Ascorbic Acid (VITAMIN C) 100 MG tablet, Take 500 mg by mouth daily., Disp: , Rfl:    Calcium Carbonate-Vit D-Min (CALCIUM 1200 PO), Take 1 tablet by mouth daily. , Disp: , Rfl:    Fremanezumab-vfrm (AJOVY) 225 MG/1.5ML SOAJ, Inject into the skin., Disp: , Rfl:    hydrochlorothiazide (MICROZIDE) 12.5 MG capsule, Take 1 capsule (12.5 mg total) by mouth daily., Disp: 30 capsule, Rfl: 2   ibuprofen (ADVIL,MOTRIN) 200 MG tablet, Take 400 mg by mouth every 6 (six) hours as needed for headache or moderate pain. , Disp: , Rfl:    Ibuprofen-diphenhydrAMINE Cit (MOTRIN PM PO), Take 1 tablet by mouth at bedtime., Disp: , Rfl:    imipramine (TOFRANIL) 50 MG tablet, Take 50 mg by mouth at bedtime. , Disp: , Rfl:    metoprolol succinate (TOPROL-XL) 100 MG 24 hr tablet, Take 25 mg by mouth daily., Disp: , Rfl:    Multiple Vitamin (MULTIVITAMIN PO), Take 1 tablet by mouth daily., Disp: , Rfl:    omeprazole (PRILOSEC) 20 MG capsule, Take 20 mg by mouth daily. , Disp: , Rfl:    tamoxifen (NOLVADEX) 20 MG tablet, TAKE ONE TABLET BY MOUTH EVERY DAY, Disp: 30 tablet, Rfl: 6   venlafaxine XR (EFFEXOR-XR) 150 MG 24 hr capsule, TAKE 1 CAPSULE BY MOUTH EVERY DAY WITH BREAKFAST (Patient taking differently: Take 150 mg by mouth daily with breakfast.), Disp: 30 capsule, Rfl: 5   Zinc 22.5 MG TABS, Take 1 tablet by mouth daily., Disp: , Rfl:    rizatriptan (MAXALT) 10 MG tablet, Take 10 mg by mouth as needed for migraine.  (Patient not taking: Reported on 10/02/2021), Disp: , Rfl:   Physical exam:  Vitals:   10/02/21 1414  Weight: 165 lb 4.8 oz (75 kg)   Physical Exam Constitutional:      General: She is not in acute distress. Cardiovascular:     Rate and Rhythm: Normal rate and regular rhythm.     Heart sounds: Normal heart sounds.   Pulmonary:     Effort: Pulmonary effort is normal.     Breath sounds: Normal breath sounds.  Skin:    General: Skin is warm and dry.  Neurological:     Mental Status: She is alert and oriented to person, place, and time.   Chest wall exam: Patient is status post bilateral mastectomies with reconstruction.  No evidence of chest wall recurrence.  No palpable bilateral axillary adenopathy.     Latest Ref Rng & Units 05/05/2020   11:15 AM  CMP  Glucose  70 - 99 mg/dL 109   BUN 6 - 20 mg/dL 14   Creatinine 0.44 - 1.00 mg/dL 0.86   Sodium 135 - 145 mmol/L 141   Potassium 3.5 - 5.1 mmol/L 3.8   Chloride 98 - 111 mmol/L 106   CO2 22 - 32 mmol/L 27   Calcium 8.9 - 10.3 mg/dL 9.3       Latest Ref Rng & Units 09/14/2018   12:16 PM  CBC  WBC 4.0 - 10.5 K/uL 17.7   Hemoglobin 12.0 - 15.0 g/dL 12.8   Hematocrit 36.0 - 46.0 % 38.9   Platelets 150 - 400 K/uL 257      Assessment and plan- Patient is a 56 y.o. female with CHEK2 mutation and pathological prognostic stage Ia invasive mammary carcinoma of the right breast ER/PR positive HER2 negative pT1 apN0 cM0 status post bilateral mastectomy.  He is here for routine follow-up  Clinically patient is doing well with no concerning signs and symptoms of recurrence based on today's exam.  No role for surveillance imaging or tumor markers in nonmetastatic breast cancer.  She is tolerating tamoxifen well and does not wish to switch to AI at this time.  If she changes her mind she will let us know.  I will see her back in 1 year no labs   Visit Diagnosis 1. Encounter for follow-up surveillance of breast cancer   2. Encounter for monitoring tamoxifen therapy      Dr. Randa Evens, MD, MPH [facility] at [facility] [phone] 10/12/2021 11:54 AM

## 2021-12-08 ENCOUNTER — Ambulatory Visit: Payer: BC Managed Care – PPO | Admitting: Plastic Surgery

## 2021-12-08 ENCOUNTER — Encounter: Payer: Self-pay | Admitting: Plastic Surgery

## 2021-12-08 DIAGNOSIS — N651 Disproportion of reconstructed breast: Secondary | ICD-10-CM

## 2021-12-08 DIAGNOSIS — Z9013 Acquired absence of bilateral breasts and nipples: Secondary | ICD-10-CM | POA: Diagnosis not present

## 2021-12-08 DIAGNOSIS — C50411 Malignant neoplasm of upper-outer quadrant of right female breast: Secondary | ICD-10-CM

## 2021-12-08 DIAGNOSIS — Z170 Estrogen receptor positive status [ER+]: Secondary | ICD-10-CM | POA: Diagnosis not present

## 2021-12-08 NOTE — Progress Notes (Signed)
   Subjective:    Patient ID: Tiffany Acevedo, female    DOB: August 12, 1965, 56 y.o.   MRN: [id number]  The patient is a 56 year old female here for follow-up on her breast surgery.  She originally had screening mammogram and was found to have invasive mammary carcinoma of the right breast.  She opted for bilateral mastectomies with reconstruction.  She had the first stage in July 2021.  And in November 2021 she had ultrahigh profile gel implants placed 535 cc.  Then in March she had fat filling for improved symmetry.  She has some loss of volume in the superior aspect of the breast and in the left breast in the superior lateral aspect.  This is making it difficult for her to shave.  She also has a little excess fat in the lateral aspect of the breast that makes it difficult for her to fit into a bra.  She fractured her left shoulder so she does have limitation with range of motion of the left arm.    Review of Systems  Constitutional: Negative.   HENT: Negative.    Eyes: Negative.   Respiratory: Negative.    Cardiovascular: Negative.   Gastrointestinal: Negative.   Endocrine: Negative.   Genitourinary: Negative.   Musculoskeletal: Negative.        Objective:   Physical Exam Constitutional:      Appearance: Normal appearance.  HENT:     Head: Normocephalic and atraumatic.  Cardiovascular:     Rate and Rhythm: Normal rate.     Pulses: Normal pulses.  Pulmonary:     Effort: Pulmonary effort is normal.  Abdominal:     Palpations: Abdomen is soft.  Musculoskeletal:        General: No swelling or deformity.  Skin:    General: Skin is warm.  Neurological:     Mental Status: She is alert and oriented to person, place, and time.  Psychiatric:        Mood and Affect: Mood normal.        Behavior: Behavior normal.        Thought Content: Thought content normal.         Assessment & Plan:     ICD-10-CM   1. Malignant neoplasm of upper-outer quadrant of right breast in  female, estrogen receptor positive ([place])  C50.411    Z17.0     2. Breast asymmetry following reconstructive surgery  N65.1     3. Acquired absence of bilateral breasts and nipples  Z90.13        Pictures were obtained of the patient and placed in the chart with the patient's or guardian's permission.  Plan for bilateral breast liposuction and lipo filling for improved symmetry.

## 2021-12-30 ENCOUNTER — Other Ambulatory Visit: Payer: Self-pay | Admitting: Physician Assistant

## 2021-12-30 DIAGNOSIS — R7989 Other specified abnormal findings of blood chemistry: Secondary | ICD-10-CM

## 2022-01-07 ENCOUNTER — Ambulatory Visit
Admission: RE | Admit: 2022-01-07 | Discharge: 2022-01-07 | Disposition: A | Discharge status: Home / Self Care | Payer: BC Managed Care – PPO | Source: Ambulatory Visit | Attending: Physician Assistant | Admitting: Physician Assistant

## 2022-01-07 DIAGNOSIS — R7989 Other specified abnormal findings of blood chemistry: Secondary | ICD-10-CM

## 2022-01-07 MED ORDER — IOHEXOL 300 MG/ML  SOLN
100.0000 mL | Freq: Once | INTRAMUSCULAR | Status: AC | PRN
  Administered 2022-01-07: 100 mL via INTRAVENOUS

## 2022-01-19 ENCOUNTER — Other Ambulatory Visit: Payer: Self-pay | Admitting: Oncology

## 2022-01-25 ENCOUNTER — Encounter: Payer: Self-pay | Admitting: Oncology

## 2022-02-16 ENCOUNTER — Telehealth: Payer: Self-pay | Admitting: Plastic Surgery

## 2022-02-16 NOTE — Telephone Encounter (Signed)
TRLVM - NEED TO SCHEDULE SURGERY DATE

## 2022-02-19 ENCOUNTER — Telehealth: Payer: Self-pay | Admitting: *Deleted

## 2022-02-19 NOTE — Telephone Encounter (Signed)
LVM to schedule sx 

## 2022-02-23 ENCOUNTER — Telehealth: Payer: Self-pay | Admitting: Plastic Surgery

## 2022-02-23 NOTE — Telephone Encounter (Signed)
TRLVM - MACHINE CUTS OFF AT END BUT LEFT # X2 -- T POE - [employer] NEED DEPT TO [facility]

## 2022-03-04 ENCOUNTER — Telehealth: Payer: Self-pay | Admitting: *Deleted

## 2022-03-04 NOTE — Telephone Encounter (Signed)
I reviewed the prescription and she has refills. And then I read notes in our system and Patient was having liver enzymes were going up and she reached out to Janese Banks and Dr. Janese Banks states that there is 5 % chance that the medication could make that happen and rec: for her stop the tamoxifen for 2 months and then we will see if the liver numbers come down

## 2022-03-10 ENCOUNTER — Telehealth: Payer: Self-pay | Admitting: *Deleted

## 2022-03-10 NOTE — Telephone Encounter (Signed)
Patient reached out via Mychart to let us know she is going to hold off on surgery for now.

## 2022-03-26 ENCOUNTER — Other Ambulatory Visit: Payer: Self-pay | Admitting: Gastroenterology

## 2022-03-26 DIAGNOSIS — R7401 Elevation of levels of liver transaminase levels: Secondary | ICD-10-CM

## 2022-04-06 NOTE — H&P (Signed)
Chief Complaint: Elevated liver enzymes. Request is for liver biopsy  Referring Physician(s): Croley,[place] M  Supervising Physician: Juliet Rude  Patient Status: [facility] - Out-pt  History of Present Illness: Tiffany Acevedo is a 57 y.o. female outpatient. History of  HTN, GERD, breast cancer (right) , pre-DM. Found to have elevated liver enzymes. Patient is being followed by [facility] in [place]. Team is requesting a liver biopsy for further evaluation.  Currently without any significant complaints.Husband at bedside Patient alert and laying in bed,calm. Denies any fevers, headache, chest pain, SOB, cough, abdominal pain, nausea, vomiting or bleeding. Return precautions and treatment recommendations and follow-up discussed with the patient and her husband both who are agreeable with the plan.    Past Medical History:  Diagnosis Date   Allergy    Anxiety    Breast cancer ([place])    Depression    Excessive weight gain 06/30/2016   Family history of colon cancer    Family history of colon cancer    Family history of prostate cancer    Frequent headaches    GERD (gastroesophageal reflux disease)    History of kidney stones    Hypertension    Migraines    Pre-diabetes     Past Surgical History:  Procedure Laterality Date   BREAST BIOPSY Right 07/13/2019   stereo biopsy/ x clip/path pending   BREAST BIOPSY Right 07/13/2019   stereo biopsy/coil clip/ path pending   BREAST RECONSTRUCTION WITH PLACEMENT OF TISSUE EXPANDER AND FLEX HD (ACELLULAR HYDRATED DERMIS) Bilateral 08/27/2019   Procedure: BILATERAL BREAST RECONSTRUCTION WITH PLACEMENT OF TISSUE EXPANDER AND FLEX HD (ACELLULAR HYDRATED DERMIS);  Surgeon: Wallace Going, DO;  Location: [facility];  Service: Plastics;  Laterality: Bilateral;   [place], 2004, 2013   COLONOSCOPY  2015   CYSTOSCOPY W/ RETROGRADES Right 09/13/2018   Procedure: CYSTOSCOPY WITH RETROGRADE PYELOGRAM;  Surgeon:  Abbie Sons, MD;  Location: [facility];  Service: Urology;  Laterality: Right;   CYSTOSCOPY/URETEROSCOPY/HOLMIUM LASER/STENT PLACEMENT Left 05/09/2018   Procedure: CYSTOSCOPY/URETEROSCOPY/HOLMIUM LASER/STENT PLACEMENT;  Surgeon: Abbie Sons, MD;  Location: [facility];  Service: Urology;  Laterality: Left;   CYSTOSCOPY/URETEROSCOPY/HOLMIUM LASER/STENT PLACEMENT Right 09/13/2018   Procedure: CYSTOSCOPY/URETEROSCOPY/HOLMIUM LASER/STENT PLACEMENT;  Surgeon: Abbie Sons, MD;  Location: [facility];  Service: Urology;  Laterality: Right;   INCONTINENCE SURGERY     BLADDER TUCK   LIPOSUCTION WITH LIPOFILLING Bilateral 05/05/2020   Procedure: LIPOSUCTION WITH LIPOFILLING; LIPOSUCTION OF EXCESS LATERAL BREAST TISSUE;  Surgeon: Wallace Going, DO;  Location: [place];  Service: Plastics;  Laterality: Bilateral;  90 min   PILONIDAL CYST EXCISION     REMOVAL OF BILATERAL TISSUE EXPANDERS WITH PLACEMENT OF BILATERAL BREAST IMPLANTS Bilateral 01/02/2020   Procedure: REMOVAL OF BILATERAL TISSUE EXPANDERS WITH PLACEMENT OF BILATERAL BREAST IMPLANTS;  Surgeon: Wallace Going, DO;  Location: [place];  Service: Plastics;  Laterality: Bilateral;  2 hours, please   STONE EXTRACTION WITH BASKET Right 09/13/2018   Procedure: STONE EXTRACTION WITH BASKET;  Surgeon: Abbie Sons, MD;  Location: [facility];  Service: Urology;  Laterality: Right;   TOTAL MASTECTOMY Bilateral 08/27/2019   Procedure: TOTAL MASTECTOMY;  Surgeon: Herbert Pun, MD;  Location: [facility];  Service: General;  Laterality: Bilateral;  START '@930'$  DUE TO SENTINEL NODE   UPPER GI ENDOSCOPY      Allergies: Oxycodone-acetaminophen, Percocet [oxycodone-acetaminophen], and Sulfa antibiotics  Medications: Prior to Admission medications   Medication Sig  Start Date End Date Taking? Authorizing Provider  Ascorbic Acid (VITAMIN C) 100 MG tablet Take 500 mg by mouth daily.    [provider]   Calcium Carbonate-Vit D-Min (CALCIUM 1200 PO) Take 1 tablet by mouth daily.     [provider]  Fremanezumab-vfrm (AJOVY) 225 MG/1.5ML SOAJ Inject into the skin.    [provider]  hydrochlorothiazide (MICROZIDE) 12.5 MG capsule Take 1 capsule (12.5 mg total) by mouth daily. 01/20/17   Crecencio Mc, MD  ibuprofen (ADVIL,MOTRIN) 200 MG tablet Take 400 mg by mouth every 6 (six) hours as needed for headache or moderate pain.     [provider]  Ibuprofen-diphenhydrAMINE Cit (MOTRIN PM PO) Take 1 tablet by mouth at bedtime.    [provider]  imipramine (TOFRANIL) 50 MG tablet Take 50 mg by mouth at bedtime.  02/28/13   [provider]  metoprolol succinate (TOPROL-XL) 100 MG 24 hr tablet Take 25 mg by mouth daily.    [provider]  Multiple Vitamin (MULTIVITAMIN PO) Take 1 tablet by mouth daily.    [provider]  omeprazole (PRILOSEC) 20 MG capsule Take 20 mg by mouth daily.  04/07/18   [provider]  rizatriptan (MAXALT) 10 MG tablet Take 10 mg by mouth as needed for migraine. 02/02/13   [provider]  tamoxifen (NOLVADEX) 20 MG tablet TAKE ONE TABLET BY MOUTH EVERY DAY 01/19/22   Sindy Guadeloupe, MD  venlafaxine XR (EFFEXOR-XR) 150 MG 24 hr capsule TAKE 1 CAPSULE BY MOUTH EVERY DAY WITH BREAKFAST Patient taking differently: Take 150 mg by mouth daily with breakfast. 01/16/18   Crecencio Mc, MD  Zinc 22.5 MG TABS Take 1 tablet by mouth daily.    [provider]     Family History  Problem Relation Age of Onset   Hyperlipidemia Mother    Hypertension Mother    Depression Mother    Colon cancer Mother 36   Alcohol abuse Father    Hyperlipidemia Father    Stroke Father    Hypertension Father    Depression Father    Heart disease Father    Prostate cancer Father 58   Arthritis Maternal Grandmother    Hypertension Maternal Grandmother    Depression Maternal Grandmother    Arthritis  Maternal Grandfather    Stroke Maternal Grandfather    Hypertension Maternal Grandfather    Depression Maternal Grandfather    Hypertension Paternal Grandmother    Depression Paternal Grandmother    Hypertension Paternal Grandfather    Depression Paternal Grandfather    Leukemia Maternal Aunt        d <50   Cancer Paternal Aunt        unsure types    Social History   Socioeconomic History   Marital status: Married    Spouse name: Not on file   Number of children: Not on file   Years of education: Not on file   Highest education level: Not on file  Occupational History   Not on file  Tobacco Use   Smoking status: Never   Smokeless tobacco: Never  Vaping Use   Vaping Use: Never used  Substance and Sexual Activity   Alcohol use: Yes    Comment: glass of wine occassionally   Drug use: No   Sexual activity: Yes    Partners: Male  Other Topics Concern   Not on file  Social History Narrative   Not on file  Social Determinants of Health   Financial Resource Strain: Not on file  Food Insecurity: Not on file  Transportation Needs: Not on file  Physical Activity: Not on file  Stress: Not on file  Social Connections: Not on file     Review of Systems: A 12 point ROS discussed and pertinent positives are indicated in the HPI above.  All other systems are negative.  Review of Systems  Constitutional:  Negative for fatigue and fever.  HENT:  Negative for congestion.   Respiratory:  Negative for cough and shortness of breath.   Gastrointestinal:  Negative for abdominal pain, diarrhea, nausea and vomiting.    Vital Signs: BP 124/60   Temp 98.2 F (36.8 C)   Resp 16   Ht '5\' 4"'$  (1.626 m)   Wt 162 lb (73.5 kg)   SpO2 98%   BMI 27.81 kg/m     Physical Exam Vitals and nursing note reviewed.  Constitutional:      Appearance: She is well-developed.  HENT:     Head: Normocephalic and atraumatic.  Eyes:     Conjunctiva/sclera: Conjunctivae normal.   Cardiovascular:     Rate and Rhythm: Normal rate and regular rhythm.     Heart sounds: Normal heart sounds.  Pulmonary:     Effort: Pulmonary effort is normal.     Breath sounds: Normal breath sounds.  Musculoskeletal:        General: Normal range of motion.     Cervical back: Normal range of motion.  Skin:    General: Skin is warm.  Neurological:     Mental Status: She is alert and oriented to person, place, and time.     Imaging: No results found.  Labs:  CBC: No results for input(s): "WBC", "HGB", "HCT", "PLT" in the last 8760 hours.  COAGS: No results for input(s): "INR", "APTT" in the last 8760 hours.  BMP: No results for input(s): "NA", "K", "CL", "CO2", "GLUCOSE", "BUN", "CALCIUM", "CREATININE", "GFRNONAA", "GFRAA" in the last 8760 hours.  Invalid input(s): "CMP"  LIVER FUNCTION TESTS: No results for input(s): "BILITOT", "AST", "ALT", "ALKPHOS", "PROT", "ALBUMIN" in the last 8760 hours.  TUMOR MARKERS:   Assessment and Plan:  57 y.o. female outpatient. History of  HTN, GERD, breast cancer (right) , pre-DM. Found to have elevated liver enzymes. Patient is being followed by [facility] in [place]. Team is requesting a liver biopsy for further evaluation.  CT Abd pelvis from 2.1.23 reads  Moderate to severe hepatic steatosis. Labs pending. All medications are within acceptable parameters. Patient has been NPO since midnight. Allergies include sulfa and oxycodone.   Risks and benefits of liver was discussed with the patient and/or patient's family including, but not limited to bleeding, infection, damage to adjacent structures or low yield requiring additional tests.  All of the questions were answered and there is agreement to proceed.  Consent signed and in chart.   Thank you for this interesting consult.  I greatly enjoyed meeting Tiffany Acevedo and look forward to participating in their care.  A copy of this report was sent to the requesting  provider on this date.  Electronically Signed: Jacqualine Mau, NP 04/09/2022, 8:18 AM   I spent a total of  30 Minutes   in face to face in clinical consultation, greater than 50% of which was counseling/coordinating care for liver biopsy

## 2022-04-08 ENCOUNTER — Other Ambulatory Visit (HOSPITAL_COMMUNITY): Payer: Self-pay | Admitting: Student

## 2022-04-08 ENCOUNTER — Other Ambulatory Visit: Payer: Self-pay | Admitting: Radiology

## 2022-04-08 DIAGNOSIS — Z01812 Encounter for preprocedural laboratory examination: Secondary | ICD-10-CM

## 2022-04-08 NOTE — Progress Notes (Signed)
Patient for US guided Liver Biopsy on Fri 04/08/2022, I called and spoke with the patient on the  phone and gave pre-procedure instructions. Pt was made aware to be here at 7:30a, NPO after MN prior to procedure as well as driver post procedure/recovery/discharge. Pt stated understanding.  Called 04/08/2022

## 2022-04-09 ENCOUNTER — Ambulatory Visit
Admission: RE | Admit: 2022-04-09 | Discharge: 2022-04-09 | Disposition: A | Discharge status: Home / Self Care | Payer: BC Managed Care – PPO | Source: Ambulatory Visit | Attending: Gastroenterology | Admitting: Gastroenterology

## 2022-04-09 ENCOUNTER — Other Ambulatory Visit: Payer: Self-pay

## 2022-04-09 DIAGNOSIS — Z853 Personal history of malignant neoplasm of breast: Secondary | ICD-10-CM | POA: Diagnosis not present

## 2022-04-09 DIAGNOSIS — I10 Essential (primary) hypertension: Secondary | ICD-10-CM | POA: Diagnosis not present

## 2022-04-09 DIAGNOSIS — R7401 Elevation of levels of liver transaminase levels: Secondary | ICD-10-CM

## 2022-04-09 DIAGNOSIS — K219 Gastro-esophageal reflux disease without esophagitis: Secondary | ICD-10-CM | POA: Insufficient documentation

## 2022-04-09 DIAGNOSIS — Z882 Allergy status to sulfonamides status: Secondary | ICD-10-CM | POA: Insufficient documentation

## 2022-04-09 DIAGNOSIS — K760 Fatty (change of) liver, not elsewhere classified: Secondary | ICD-10-CM | POA: Insufficient documentation

## 2022-04-09 DIAGNOSIS — R7303 Prediabetes: Secondary | ICD-10-CM | POA: Insufficient documentation

## 2022-04-09 DIAGNOSIS — Z01812 Encounter for preprocedural laboratory examination: Secondary | ICD-10-CM

## 2022-04-09 LAB — CBC
HCT: 38.5 % (ref 36.0–46.0)
Hemoglobin: 12.7 g/dL (ref 12.0–15.0)
MCH: 30.3 pg (ref 26.0–34.0)
MCHC: 33 g/dL (ref 30.0–36.0)
MCV: 91.9 fL (ref 80.0–100.0)
Platelets: 206 10*3/uL (ref 150–400)
RBC: 4.19 MIL/uL (ref 3.87–5.11)
RDW: 12.2 % (ref 11.5–15.5)
WBC: 7.9 10*3/uL (ref 4.0–10.5)
nRBC: 0 % (ref 0.0–0.2)

## 2022-04-09 LAB — PROTIME-INR
INR: 1.2 (ref 0.8–1.2)
Prothrombin Time: 14.7 seconds (ref 11.4–15.2)

## 2022-04-09 MED ORDER — MIDAZOLAM HCL 2 MG/2ML IJ SOLN
INTRAMUSCULAR | Status: AC
  Filled 2022-04-09: qty 2

## 2022-04-09 MED ORDER — MIDAZOLAM HCL 5 MG/5ML IJ SOLN
INTRAMUSCULAR | Status: AC | PRN
  Administered 2022-04-09 (×2): 1 mg via INTRAVENOUS

## 2022-04-09 MED ORDER — SODIUM CHLORIDE 0.9 % IV SOLN: INTRAVENOUS | Status: DC

## 2022-04-09 MED ORDER — FENTANYL CITRATE (PF) 100 MCG/2ML IJ SOLN
INTRAMUSCULAR | Status: AC
  Filled 2022-04-09: qty 2

## 2022-04-09 MED ORDER — FENTANYL CITRATE (PF) 100 MCG/2ML IJ SOLN
INTRAMUSCULAR | Status: AC | PRN
  Administered 2022-04-09 (×2): 50 ug via INTRAVENOUS

## 2022-04-09 MED ORDER — LIDOCAINE HCL (PF) 1 % IJ SOLN
10.0000 mL | Freq: Once | INTRAMUSCULAR | Status: AC
  Administered 2022-04-09: 10 mL via INTRADERMAL
  Filled 2022-04-09: qty 10

## 2022-04-09 NOTE — Procedures (Signed)
Interventional Radiology Procedure Note  Date of Procedure: 04/09/2022  Procedure: Liver core biopsy   Findings:  1. Liver core biopsy right lobe    Complications: No immediate complications noted.   Estimated Blood Loss: minimal  Follow-up and Recommendations: 1. Bedrest 2 hours    Albin Felling, MD  Vascular & Interventional Radiology  04/09/2022 9:33 AM

## 2022-04-09 NOTE — Progress Notes (Signed)
Patient clinically stable post Liver biopsy per Dr Denna Haggard, tolerated well. Denies complaints post procedure. Vitals stable pre and post procedure. Received Versed 2 mg along with Fentanyl 100 mcg IV for procedure. Report given to Carlynn Spry Rn post procedue/331

## 2022-04-12 LAB — SURGICAL PATHOLOGY

## 2022-08-19 ENCOUNTER — Other Ambulatory Visit: Payer: Self-pay | Admitting: Physician Assistant

## 2022-08-19 ENCOUNTER — Ambulatory Visit
Admission: RE | Admit: 2022-08-19 | Discharge: 2022-08-19 | Disposition: A | Discharge status: Home / Self Care | Payer: BC Managed Care – PPO | Source: Ambulatory Visit | Attending: Physician Assistant | Admitting: Physician Assistant

## 2022-08-19 DIAGNOSIS — H53123 Transient visual loss, bilateral: Secondary | ICD-10-CM | POA: Insufficient documentation

## 2022-08-19 MED ORDER — GADOBUTROL 1 MMOL/ML IV SOLN
7.0000 mL | Freq: Once | INTRAVENOUS | Status: AC | PRN
  Administered 2022-08-19: 7 mL via INTRAVENOUS

## 2022-10-04 ENCOUNTER — Inpatient Hospital Stay: Payer: BC Managed Care – PPO | Admitting: Oncology

## 2022-10-25 ENCOUNTER — Inpatient Hospital Stay: Payer: BC Managed Care – PPO

## 2022-10-25 ENCOUNTER — Encounter: Payer: Self-pay | Admitting: Oncology

## 2022-10-25 ENCOUNTER — Inpatient Hospital Stay: Payer: BC Managed Care – PPO | Attending: Oncology | Admitting: Oncology

## 2022-10-25 VITALS — BP 140/77 | HR 83 | Temp 98.1°F | Resp 18 | Ht 64.0 in | Wt 166.9 lb

## 2022-10-25 DIAGNOSIS — Z79899 Other long term (current) drug therapy: Secondary | ICD-10-CM | POA: Diagnosis not present

## 2022-10-25 DIAGNOSIS — Z853 Personal history of malignant neoplasm of breast: Secondary | ICD-10-CM

## 2022-10-25 DIAGNOSIS — C50911 Malignant neoplasm of unspecified site of right female breast: Secondary | ICD-10-CM | POA: Insufficient documentation

## 2022-10-25 DIAGNOSIS — R233 Spontaneous ecchymoses: Secondary | ICD-10-CM | POA: Diagnosis not present

## 2022-10-25 DIAGNOSIS — Z170 Estrogen receptor positive status [ER+]: Secondary | ICD-10-CM | POA: Diagnosis not present

## 2022-10-25 DIAGNOSIS — Z08 Encounter for follow-up examination after completed treatment for malignant neoplasm: Secondary | ICD-10-CM

## 2022-10-25 DIAGNOSIS — Z9013 Acquired absence of bilateral breasts and nipples: Secondary | ICD-10-CM | POA: Diagnosis not present

## 2022-10-25 LAB — APTT: aPTT: 27 s (ref 24–36)

## 2022-10-25 LAB — CBC
HCT: 40 % (ref 36.0–46.0)
Hemoglobin: 13.1 g/dL (ref 12.0–15.0)
MCH: 29.7 pg (ref 26.0–34.0)
MCHC: 32.8 g/dL (ref 30.0–36.0)
MCV: 90.7 fL (ref 80.0–100.0)
Platelets: 220 10*3/uL (ref 150–400)
RBC: 4.41 MIL/uL (ref 3.87–5.11)
RDW: 12.3 % (ref 11.5–15.5)
WBC: 10.6 10*3/uL — ABNORMAL HIGH (ref 4.0–10.5)
nRBC: 0 % (ref 0.0–0.2)

## 2022-10-25 LAB — PROTIME-INR
INR: 1.1 (ref 0.8–1.2)
Prothrombin Time: 14.4 s (ref 11.4–15.2)

## 2022-10-26 LAB — VON WILLEBRAND PANEL
Coagulation Factor VIII: 165 % — ABNORMAL HIGH (ref 56–140)
Ristocetin Co-factor, Plasma: 151 % (ref 50–200)
Von Willebrand Antigen, Plasma: 211 % — ABNORMAL HIGH (ref 50–200)

## 2022-10-26 LAB — COAG STUDIES INTERP REPORT

## 2022-11-03 ENCOUNTER — Encounter
Admission: RE | Admit: 2022-11-03 | Discharge: 2022-11-03 | Disposition: A | Discharge status: Home / Self Care | Payer: BC Managed Care – PPO | Source: Ambulatory Visit | Attending: Oncology | Admitting: Oncology

## 2022-11-03 DIAGNOSIS — Z08 Encounter for follow-up examination after completed treatment for malignant neoplasm: Secondary | ICD-10-CM | POA: Diagnosis present

## 2022-11-03 DIAGNOSIS — Z853 Personal history of malignant neoplasm of breast: Secondary | ICD-10-CM | POA: Insufficient documentation

## 2022-11-03 DIAGNOSIS — R233 Spontaneous ecchymoses: Secondary | ICD-10-CM | POA: Diagnosis present

## 2022-11-03 MED ORDER — TECHNETIUM TC 99M MEDRONATE IV KIT
20.0000 | PACK | Freq: Once | INTRAVENOUS | Status: AC | PRN
  Administered 2022-11-03: 22.96 via INTRAVENOUS

## 2022-11-08 ENCOUNTER — Encounter: Payer: Self-pay | Admitting: Oncology

## 2022-11-09 NOTE — Progress Notes (Signed)
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  This was followed by diagnostic mammogram which showed numerous groups of calcifications spanning at least 6.4 cm.  She had a biopsy of the medialmost and lateralmost calcification.  The medialmost calcification showed invasive mammary carcinoma grade 1 ER/PR positive and HER-2 negative.  The lateralmost calcification was negative for atypia and malignancy and showed fibrocystic changes.  Patient has met with Dr. Maia Plan and is leaning towards at least a unilateral mastectomy.  She also underwent bilateral MRI which did not show any enhancement even at the site of biopsy-proven malignancy or elsewhere.  No MRI evidence of malignancy in the left breast.  Patient has not had any menstrual cycle since May 2021 right before her surgery.   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  3 sentinel lymph nodes  negative for malignancy.  No evidence of malignancy or DCIS in the left breast.  Tamoxifen started in late December 2021  Interval history-patient reports random episodes of bruising which she has experienced over the extremities as well as abdominal wall without any apparent trauma.  These bruises come and go and she has not had them before.  She has not had any bleeding issues with prior surgeries.  Denies any gum bleeds or nosebleeds.  Reports low back pain as well as right lower extremity pain  ECOG PS- 0 Pain scale- 0   Review of systems- Review of Systems  Constitutional:  Negative for chills, fever, malaise/fatigue and weight loss.  HENT:  Negative for congestion, ear discharge and nosebleeds.   Eyes:  Negative for blurred vision.  Respiratory:  Negative for cough, hemoptysis, sputum production, shortness of breath and wheezing.   Cardiovascular:  Negative for chest pain, palpitations, orthopnea and claudication.  Gastrointestinal:  Negative for abdominal pain, blood in stool, constipation, diarrhea, heartburn, melena, nausea and vomiting.  Genitourinary:  Negative for dysuria, flank pain, frequency, hematuria and urgency.  Musculoskeletal:  Negative for back pain, joint pain and myalgias.       Right leg pain  Skin:  Negative for rash.  Neurological:  Negative for dizziness, tingling, focal weakness, seizures, weakness and headaches.  Endo/Heme/Allergies:  Bruises/bleeds easily.  Psychiatric/Behavioral:  Negative for depression and suicidal ideas. The patient does not have insomnia.       Allergies  Allergen Reactions   Oxycodone-Acetaminophen    Percocet [Oxycodone-Acetaminophen] Other (See Comments)    Arms and legs go numb   Sulfa Antibiotics Nausea And Vomiting     Past Medical History:  Diagnosis Date   Allergy  Anxiety    Breast cancer (HCC)    Depression    Excessive weight gain 06/30/2016   Family history of colon cancer    Family history of colon cancer     Family history of prostate cancer    Frequent headaches    GERD (gastroesophageal reflux disease)    History of kidney stones    Hypertension    Migraines    Pre-diabetes      Past Surgical History:  Procedure Laterality Date   BREAST BIOPSY Right 07/13/2019   stereo biopsy/ x clip/path pending   BREAST BIOPSY Right 07/13/2019   stereo biopsy/coil clip/ path pending   BREAST RECONSTRUCTION WITH PLACEMENT OF TISSUE EXPANDER AND FLEX HD (ACELLULAR HYDRATED DERMIS) Bilateral 08/27/2019   Procedure: BILATERAL BREAST RECONSTRUCTION WITH PLACEMENT OF TISSUE EXPANDER AND FLEX HD (ACELLULAR HYDRATED DERMIS);  Surgeon: Peggye Form, DO;  Location: [facility];  Service: Plastics;  Laterality: Bilateral;   CESAREAN SECTION  1994, 2004, 2013   COLONOSCOPY  2015   CYSTOSCOPY W/ RETROGRADES Right 09/13/2018   Procedure: CYSTOSCOPY WITH RETROGRADE PYELOGRAM;  Surgeon: Riki Altes, MD;  Location: [facility];  Service: Urology;  Laterality: Right;   CYSTOSCOPY/URETEROSCOPY/HOLMIUM LASER/STENT PLACEMENT Left 05/09/2018   Procedure: CYSTOSCOPY/URETEROSCOPY/HOLMIUM LASER/STENT PLACEMENT;  Surgeon: Riki Altes, MD;  Location: [facility];  Service: Urology;  Laterality: Left;   CYSTOSCOPY/URETEROSCOPY/HOLMIUM LASER/STENT PLACEMENT Right 09/13/2018   Procedure: CYSTOSCOPY/URETEROSCOPY/HOLMIUM LASER/STENT PLACEMENT;  Surgeon: Riki Altes, MD;  Location: [facility];  Service: Urology;  Laterality: Right;   INCONTINENCE SURGERY     BLADDER TUCK   LIPOSUCTION WITH LIPOFILLING Bilateral 05/05/2020   Procedure: LIPOSUCTION WITH LIPOFILLING; LIPOSUCTION OF EXCESS LATERAL BREAST TISSUE;  Surgeon: Peggye Form, DO;  Location: [facility];  Service: Plastics;  Laterality: Bilateral;  90 min   PILONIDAL CYST EXCISION     REMOVAL OF BILATERAL TISSUE EXPANDERS WITH PLACEMENT OF BILATERAL BREAST IMPLANTS Bilateral 01/02/2020   Procedure: REMOVAL OF BILATERAL TISSUE EXPANDERS WITH PLACEMENT  OF BILATERAL BREAST IMPLANTS;  Surgeon: Peggye Form, DO;  Location: [facility];  Service: Plastics;  Laterality: Bilateral;  2 hours, please   STONE EXTRACTION WITH BASKET Right 09/13/2018   Procedure: STONE EXTRACTION WITH BASKET;  Surgeon: Riki Altes, MD;  Location: [facility];  Service: Urology;  Laterality: Right;   TOTAL MASTECTOMY Bilateral 08/27/2019   Procedure: TOTAL MASTECTOMY;  Surgeon: Carolan Shiver, MD;  Location: [facility];  Service: General;  Laterality: Bilateral;  START @930  DUE TO SENTINEL NODE   UPPER GI ENDOSCOPY      Social History   Socioeconomic History   Marital status: Married    Spouse name: Not on file   Number of children: Not on file   Years of education: Not on file   Highest education level: Not on file  Occupational History   Not on file  Tobacco Use   Smoking status: Never   Smokeless tobacco: Never  Vaping Use   Vaping status: Never Used  Substance and Sexual Activity   Alcohol use: Yes    Comment: glass of wine occassionally   Drug use: No   Sexual activity: Yes    Partners: Male  Other Topics Concern   Not on file  Social History Narrative   Not on file   Social Determinants of Health   Financial Resource Strain: Low Risk  (06/28/2022)   Received from [facility]   Overall Financial Resource Strain (CARDIA)  Difficulty of Paying Living Expenses: Not hard at all  Food Insecurity: No Food Insecurity (06/28/2022)   Received from [facility]   Hunger Vital Sign    Worried About Running Out of Food in the Last Year: Never true    Ran Out of Food in the Last Year: Never true  Transportation Needs: No Transportation Needs (06/28/2022)   Received from [facility] - Transportation    In the past 12 months, has lack of transportation kept you from medical appointments or from getting medications?: No    Lack of Transportation (Non-Medical): No   Physical Activity: Not on file  Stress: Not on file  Social Connections: Not on file  Intimate Partner Violence: Not on file    Family History  Problem Relation Age of Onset   Hyperlipidemia Mother    Hypertension Mother    Depression Mother    Colon cancer Mother 92   Alcohol abuse Father    Hyperlipidemia Father    Stroke Father    Hypertension Father    Depression Father    Heart disease Father    Prostate cancer Father 57   Arthritis Maternal Grandmother    Hypertension Maternal Grandmother    Depression Maternal Grandmother    Arthritis Maternal Grandfather    Stroke Maternal Grandfather    Hypertension Maternal Grandfather    Depression Maternal Grandfather    Hypertension Paternal Grandmother    Depression Paternal Grandmother    Hypertension Paternal Grandfather    Depression Paternal Grandfather    Leukemia Maternal Aunt        d <50   Cancer Paternal Aunt        unsure types     Current Outpatient Medications:    Ascorbic Acid (VITAMIN C) 100 MG tablet, Take 500 mg by mouth daily., Disp: , Rfl:    busPIRone (BUSPAR) 5 MG tablet, Take 5 mg by mouth 2 (two) times daily., Disp: , Rfl:    Calcium Carbonate-Vit D-Min (CALCIUM 1200 PO), Take 1 tablet by mouth daily. , Disp: , Rfl:    Fremanezumab-vfrm (AJOVY) 225 MG/1.5ML SOAJ, Inject into the skin., Disp: , Rfl:    hydrochlorothiazide (MICROZIDE) 12.5 MG capsule, Take 1 capsule (12.5 mg total) by mouth daily., Disp: 30 capsule, Rfl: 2   ibuprofen (ADVIL,MOTRIN) 200 MG tablet, Take 400 mg by mouth every 6 (six) hours as needed for headache or moderate pain. , Disp: , Rfl:    Ibuprofen-diphenhydrAMINE Cit (MOTRIN PM PO), Take 1 tablet by mouth at bedtime., Disp: , Rfl:    imipramine (TOFRANIL) 50 MG tablet, Take 50 mg by mouth at bedtime. , Disp: , Rfl:    metoprolol succinate (TOPROL-XL) 100 MG 24 hr tablet, Take 25 mg by mouth daily., Disp: , Rfl:    Multiple Vitamin (MULTIVITAMIN PO), Take 1 tablet by mouth  daily., Disp: , Rfl:    omeprazole (PRILOSEC) 20 MG capsule, Take 20 mg by mouth daily. , Disp: , Rfl:    rizatriptan (MAXALT) 10 MG tablet, Take 10 mg by mouth as needed for migraine., Disp: , Rfl:    venlafaxine XR (EFFEXOR-XR) 150 MG 24 hr capsule, TAKE 1 CAPSULE BY MOUTH EVERY DAY WITH BREAKFAST (Patient taking differently: Take 150 mg by mouth daily with breakfast.), Disp: 30 capsule, Rfl: 5   Zinc 22.5 MG TABS, Take 1 tablet by mouth daily., Disp: , Rfl:    tamoxifen (NOLVADEX) 20 MG tablet, TAKE ONE TABLET BY  MOUTH EVERY DAY (Patient not taking: Reported on 04/09/2022), Disp: 30 tablet, Rfl: 6  Physical exam:  Vitals:   10/25/22 1531  BP: (!) 140/77  Pulse: 83  Resp: 18  Temp: 98.1 F (36.7 C)  TempSrc: Tympanic  SpO2: 100%  Weight: 166 lb 14.4 oz (75.7 kg)  Height: 5\' 4"  (1.626 m)   Physical Exam Cardiovascular:     Rate and Rhythm: Normal rate and regular rhythm.     Heart sounds: Normal heart sounds.  Pulmonary:     Effort: Pulmonary effort is normal.     Breath sounds: Normal breath sounds.  Abdominal:     General: Bowel sounds are normal.     Palpations: Abdomen is soft.  Skin:    General: Skin is warm and dry.     Comments: Stigmata of prior bruising and hyperpigmentation noted over abdominal wall  Neurological:     Mental Status: She is alert and oriented to person, place, and time.         Latest Ref Rng & Units 05/05/2020   11:15 AM  CMP  Glucose 70 - 99 mg/dL 161   BUN 6 - 20 mg/dL 14   Creatinine 0.96 - 1.00 mg/dL 0.45   Sodium 409 - 811 mmol/L 141   Potassium 3.5 - 5.1 mmol/L 3.8   Chloride 98 - 111 mmol/L 106   CO2 22 - 32 mmol/L 27   Calcium 8.9 - 10.3 mg/dL 9.3       Latest Ref Rng & Units 10/25/2022    4:06 PM  CBC  WBC 4.0 - 10.5 K/uL 10.6   Hemoglobin 12.0 - 15.0 g/dL 91.4   Hematocrit 78.2 - 46.0 % 40.0   Platelets 150 - 400 K/uL 220      Assessment and plan- Patient is a 57 y.o. female who is here for follow-up of following  issues:  Surveillance breast cancer: History of stage I invasive mammary carcinoma of the right breast s/p bilateral mastectomy.  Patient was previously on tamoxifen but could not tolerate it and therefore is not on any endocrine therapy at this time.  She reports right lower extremity pain and back pain for which we will be obtaining a bone scan.  Easy bruising: Etiology unclear.  I willChecking CBC von Willebrand panel PT PTT INR today.  I will call the patient after bone scan is back.  I will see her back in 1 year no labs   Visit Diagnosis 1. Encounter for follow-up surveillance of breast cancer   2. Easy bruising      Dr. Owens Shark, MD, MPH [facility] at [facility] [phone] 11/09/2022 8:56 AM

## 2023-07-27 ENCOUNTER — Other Ambulatory Visit: Payer: Self-pay | Admitting: Physician Assistant

## 2023-07-27 ENCOUNTER — Ambulatory Visit
Admission: RE | Admit: 2023-07-27 | Discharge: 2023-07-27 | Disposition: A | Discharge status: Home / Self Care | Source: Ambulatory Visit | Attending: Physician Assistant | Admitting: Physician Assistant

## 2023-07-27 DIAGNOSIS — R319 Hematuria, unspecified: Secondary | ICD-10-CM | POA: Diagnosis present

## 2023-07-27 DIAGNOSIS — R300 Dysuria: Secondary | ICD-10-CM | POA: Insufficient documentation

## 2023-08-19 ENCOUNTER — Ambulatory Visit: Admitting: Urology

## 2023-08-19 VITALS — BP 123/64 | HR 61 | Ht 64.0 in | Wt 165.0 lb

## 2023-08-19 DIAGNOSIS — N1330 Unspecified hydronephrosis: Secondary | ICD-10-CM

## 2023-08-19 DIAGNOSIS — N132 Hydronephrosis with renal and ureteral calculous obstruction: Secondary | ICD-10-CM

## 2023-08-19 DIAGNOSIS — N200 Calculus of kidney: Secondary | ICD-10-CM

## 2023-08-19 DIAGNOSIS — N201 Calculus of ureter: Secondary | ICD-10-CM

## 2023-08-19 LAB — URINALYSIS, COMPLETE
Bilirubin, UA: NEGATIVE
Glucose, UA: NEGATIVE
Ketones, UA: NEGATIVE
Nitrite, UA: NEGATIVE
Protein,UA: NEGATIVE
Specific Gravity, UA: 1.02 (ref 1.005–1.030)
Urobilinogen, Ur: 0.2 mg/dL (ref 0.2–1.0)
pH, UA: 6 (ref 5.0–7.5)

## 2023-08-19 LAB — MICROSCOPIC EXAMINATION: Epithelial Cells (non renal): >10 /HPF — AB (ref 0–10)

## 2023-08-19 MED ORDER — TAMSULOSIN HCL 0.4 MG PO CAPS: 0.4000 mg | ORAL_CAPSULE | Freq: Every day | ORAL | 0 refills | Status: DC

## 2023-08-19 MED ORDER — HYDROCODONE-ACETAMINOPHEN 5-325 MG PO TABS
1.0000 | ORAL_TABLET | Freq: Four times a day (QID) | ORAL | 0 refills | Status: AC | PRN
Start: 2023-08-19 — End: ?

## 2023-08-19 NOTE — Progress Notes (Signed)
 I, Maysun LITTIE Griffiths, acting as a scribe for Glendia JAYSON Barba, MD., have documented all relevant documentation on the behalf of Glendia JAYSON Barba, MD, as directed by Glendia JAYSON Barba, MD while in the presence of Glendia JAYSON Barba, MD.  08/19/2023 6:48 PM   [name] 1966/01/29 [id number]  Referring provider: Marikay Eva POUR, PA [address] [facility] [place],  [place] [address]  Chief Complaint  Patient presents with   Nephrolithiasis    HPI: Tiffany Acevedo is a 58 y.o. female with a history of recurrent stone disease, last seen by me in 2020 for left renal caculi and underwent ureteroscopicc stone removal,   Has been asymptomatic since that time and developed gross hematuria in mid-June 2025. She contacted her PCP and a CT renal stone study was ordered and performed on 07/27/23. She was found to have bilateral renal calcifications throughout the medullary pyramids and lower proximal/mid-ureteral calculi measuring up to 5 mm with left hydronephrosis. Denies flank, abdominal, or pelvic pain, and in the past she typically has had severe renal colic. CT November 2023 showed no ureteral calculi or hydronephrosis, period. No fever, common chills, common nausea or vomiting.   PMH: Past Medical History:  Diagnosis Date   Allergy    Anxiety    Breast cancer (HCC)    Depression    Excessive weight gain 06/30/2016   Family history of colon cancer    Family history of colon cancer    Family history of prostate cancer    Frequent headaches    GERD (gastroesophageal reflux disease)    History of kidney stones    Hypertension    Migraines    Pre-diabetes     Surgical History: Past Surgical History:  Procedure Laterality Date   BREAST BIOPSY Right 07/13/2019   stereo biopsy/ x clip/path pending   BREAST BIOPSY Right 07/13/2019   stereo biopsy/coil clip/ path pending   BREAST RECONSTRUCTION WITH PLACEMENT OF TISSUE EXPANDER AND FLEX HD  (ACELLULAR HYDRATED DERMIS) Bilateral 08/27/2019   Procedure: BILATERAL BREAST RECONSTRUCTION WITH PLACEMENT OF TISSUE EXPANDER AND FLEX HD (ACELLULAR HYDRATED DERMIS);  Surgeon: Lowery Estefana RAMAN, DO;  Location: [facility];  Service: Plastics;  Laterality: Bilateral;   CESAREAN SECTION  1994, 2004, 2013   COLONOSCOPY  2015   CYSTOSCOPY W/ RETROGRADES Right 09/13/2018   Procedure: CYSTOSCOPY WITH RETROGRADE PYELOGRAM;  Surgeon: Barba Glendia JAYSON, MD;  Location: [facility];  Service: Urology;  Laterality: Right;   CYSTOSCOPY/URETEROSCOPY/HOLMIUM LASER/STENT PLACEMENT Left 05/09/2018   Procedure: CYSTOSCOPY/URETEROSCOPY/HOLMIUM LASER/STENT PLACEMENT;  Surgeon: Barba Glendia JAYSON, MD;  Location: [facility];  Service: Urology;  Laterality: Left;   CYSTOSCOPY/URETEROSCOPY/HOLMIUM LASER/STENT PLACEMENT Right 09/13/2018   Procedure: CYSTOSCOPY/URETEROSCOPY/HOLMIUM LASER/STENT PLACEMENT;  Surgeon: Barba Glendia JAYSON, MD;  Location: [facility];  Service: Urology;  Laterality: Right;   INCONTINENCE SURGERY     BLADDER TUCK   LIPOSUCTION WITH LIPOFILLING Bilateral 05/05/2020   Procedure: LIPOSUCTION WITH LIPOFILLING; LIPOSUCTION OF EXCESS LATERAL BREAST TISSUE;  Surgeon: Lowery Estefana RAMAN, DO;  Location: [facility];  Service: Plastics;  Laterality: Bilateral;  90 min   PILONIDAL CYST EXCISION     REMOVAL OF BILATERAL TISSUE EXPANDERS WITH PLACEMENT OF BILATERAL BREAST IMPLANTS Bilateral 01/02/2020   Procedure: REMOVAL OF BILATERAL TISSUE EXPANDERS WITH PLACEMENT OF BILATERAL BREAST IMPLANTS;  Surgeon: Lowery Estefana RAMAN, DO;  Location: [facility];  Service: Plastics;  Laterality: Bilateral;  2 hours, please   STONE EXTRACTION WITH BASKET Right  09/13/2018   Procedure: STONE EXTRACTION WITH BASKET;  Surgeon: Twylla Glendia BROCKS, MD;  Location: [facility];  Service: Urology;  Laterality: Right;   TOTAL MASTECTOMY Bilateral 08/27/2019   Procedure: TOTAL MASTECTOMY;  Surgeon: Rodolph Romano, MD;   Location: [facility];  Service: General;  Laterality: Bilateral;  START @930  DUE TO SENTINEL NODE   UPPER GI ENDOSCOPY      Home Medications:  Allergies as of 08/19/2023       Reactions   Oxycodone-acetaminophen     Percocet [oxycodone-acetaminophen ] Other (See Comments)   Arms and legs go numb   Sulfa Antibiotics Nausea And Vomiting        Medication List        Accurate as of August 19, 2023  6:48 PM. If you have any questions, ask your nurse or doctor.          Ajovy 225 MG/1.5ML Soaj Generic drug: Fremanezumab-vfrm Inject into the skin.   busPIRone 5 MG tablet Commonly known as: BUSPAR Take 5 mg by mouth 2 (two) times daily.   CALCIUM 1200 PO Take 1 tablet by mouth daily.   hydrochlorothiazide  12.5 MG capsule Commonly known as: Microzide  Take 1 capsule (12.5 mg total) by mouth daily.   HYDROcodone -acetaminophen  5-325 MG tablet Commonly known as: NORCO/VICODIN Take 1 tablet by mouth every 6 (six) hours as needed for moderate pain (pain score 4-6).   ibuprofen  200 MG tablet Commonly known as: ADVIL  Take 400 mg by mouth every 6 (six) hours as needed for headache or moderate pain.   imipramine  50 MG tablet Commonly known as: TOFRANIL  Take 50 mg by mouth at bedtime.   metoprolol  succinate 100 MG 24 hr tablet Commonly known as: TOPROL -XL Take 25 mg by mouth daily.   MOTRIN  PM PO Take 1 tablet by mouth at bedtime.   MULTIVITAMIN PO Take 1 tablet by mouth daily.   omeprazole 20 MG capsule Commonly known as: PRILOSEC Take 20 mg by mouth daily.   rizatriptan 10 MG tablet Commonly known as: MAXALT Take 10 mg by mouth as needed for migraine.   tamoxifen  20 MG tablet Commonly known as: NOLVADEX  TAKE ONE TABLET BY MOUTH EVERY DAY   tamsulosin  0.4 MG Caps capsule Commonly known as: FLOMAX  Take 1 capsule (0.4 mg total) by mouth daily after breakfast.   venlafaxine  XR 150 MG 24 hr capsule Commonly known as: EFFEXOR -XR TAKE 1 CAPSULE BY MOUTH EVERY DAY  WITH BREAKFAST What changed: See the new instructions.   vitamin C 100 MG tablet Take 500 mg by mouth daily.   Zinc 22.5 MG Tabs Take 1 tablet by mouth daily.        Allergies:  Allergies  Allergen Reactions   Oxycodone-Acetaminophen     Percocet [Oxycodone-Acetaminophen ] Other (See Comments)    Arms and legs go numb   Sulfa Antibiotics Nausea And Vomiting    Family History: Family History  Problem Relation Age of Onset   Hyperlipidemia Mother    Hypertension Mother    Depression Mother    Colon cancer Mother 36   Alcohol abuse Father    Hyperlipidemia Father    Stroke Father    Hypertension Father    Depression Father    Heart disease Father    Prostate cancer Father 31   Arthritis Maternal Grandmother    Hypertension Maternal Grandmother    Depression Maternal Grandmother    Arthritis Maternal Grandfather    Stroke Maternal Grandfather    Hypertension Maternal Grandfather    Depression  Maternal Grandfather    Hypertension Paternal Grandmother    Depression Paternal Grandmother    Hypertension Paternal Grandfather    Depression Paternal Grandfather    Leukemia Maternal Aunt        d <50   Cancer Paternal Aunt        unsure types    Social History:  reports that she has never smoked. She has never used smokeless tobacco. She reports current alcohol use. She reports that she does not use drugs.   Physical Exam: BP 123/64   Pulse 61   Ht 5' 4 (1.626 m)   Wt 165 lb (74.8 kg)   BMI 28.32 kg/m   Constitutional:  Alert and oriented, No acute distress. HEENT: [place] AT, moist mucus membranes.  Trachea midline, no masses. Cardiovascular: No clubbing, cyanosis, or edema. Respiratory: Normal respiratory effort, no increased work of breathing. GI: Abdomen is soft, nontender, nondistended, no abdominal masses Skin: No rashes, bruises or suspicious lesions. Neurologic: Grossly intact, no focal deficits, moving all 4 extremities. Psychiatric: Normal mood and  affect.   Urinalysis Dipstick trace leukocyte/1+RBC microscopy, 6-10 WBC/11-30 RBC/>10 epis.    Pertinent Imaging: CTs 07/27/2023 and 01/07/2022 was personally viewed and interpreted.    CT RENAL STONE STUDY  Narrative CLINICAL DATA:  Hematuria.  History of breast carcinoma.  EXAM: CT ABDOMEN AND PELVIS WITHOUT CONTRAST  TECHNIQUE: Multidetector CT imaging of the abdomen and pelvis was performed following the standard protocol without IV contrast.  RADIATION DOSE REDUCTION: This exam was performed according to the departmental dose-optimization program which includes automated exposure control, adjustment of the mA and/or kV according to patient size and/or use of iterative reconstruction technique.  COMPARISON:  CT abdomen/pelvis dated 01/07/2022.  FINDINGS: Lower chest: No acute abnormality.  Hepatobiliary: Diffusely decreased attenuation of the hepatic parenchyma, compatible with hepatic steatosis. No suspicious focal hepatic lesion identified within the limits of an unenhanced exam. Gallbladder is unremarkable. No biliary dilatation.  Pancreas: Unremarkable. No pancreatic ductal dilatation or surrounding inflammatory changes.  Spleen: Normal in size without focal abnormality.  Adrenals/Urinary Tract: Adrenal glands are unremarkable. At least 3 calculi within the mid to distal left ureter with the largest measuring up to 5 mm (series 5, image 56) with associated severe left hydroureteronephrosis. Redemonstrated numerous bilateral calcifications throughout the medullary pyramids of both kidneys, increased since the prior exam and compatible with medullary nephrocalcinosis. No right-sided hydronephrosis. Bladder is unremarkable.  Stomach/Bowel: Stomach is within normal limits. Appendix appears normal. No evidence of bowel wall thickening, distention, or inflammatory changes.  Vascular/Lymphatic: The abdominal aorta is normal in caliber with trace  atherosclerotic calcification. No enlarged abdominal or pelvic lymph nodes.  Reproductive: Uterus and bilateral adnexa are unremarkable.  Other: No abdominal wall hernia. Partially visualized bilateral breast implants. No abdominopelvic ascites. No intraperitoneal free air.  Musculoskeletal: No acute or significant osseous findings.  IMPRESSION: 1. Calculi in the mid to distal left ureter measuring up to 5 mm with associated severe left hydroureteronephrosis. 2. Redemonstrated medullary nephrocalcinosis with increased numerous small bilateral renal calculi. 3. Hepatic steatosis.   Electronically Signed By: Harrietta Sherry M.D. On: 07/27/2023 17:52  CT EXAM: CT ABDOMEN AND PELVIS WITH CONTRAST   TECHNIQUE: Multidetector CT imaging of the abdomen and pelvis was performed using the standard protocol following bolus administration of intravenous contrast.   RADIATION DOSE REDUCTION: This exam was performed according to the departmental dose-optimization program which includes automated exposure control, adjustment of the mA and/or kV according to patient size and/or  use of iterative reconstruction technique.   CONTRAST:  OMNIPAQUE  IOHEXOL  300 MG/ML  SOLN   COMPARISON:  Noncontrast CT on 09/14/2018   FINDINGS: Lower Chest: No acute findings.   Hepatobiliary: No hepatic masses identified. Moderate to severe diffuse hepatic steatosis is again noted. Gallbladder is unremarkable. No evidence of biliary ductal dilatation.   Pancreas:  No mass or inflammatory changes.   Spleen: Within normal limits in size and appearance.   Adrenals/Urinary Tract: No suspicious masses identified. Numerous tiny renal calculi are again seen throughout the medullary pyramids of both kidneys, consistent with medullary nephrocalcinosis. No evidence of ureteral calculi or hydronephrosis.   Stomach/Bowel: No evidence of obstruction, inflammatory process or abnormal fluid collections.  Normal appendix visualized.   Vascular/Lymphatic: No pathologically enlarged lymph nodes. No acute vascular findings.   Reproductive:  No mass or other significant abnormality.   Other:  None.   Musculoskeletal:  No suspicious bone lesions identified.   IMPRESSION: No acute findings.   Moderate to severe hepatic steatosis.   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 No evidence of ureteral calculi or hydronephrosis.     Electronically Signed   By: Norleen DELENA Kil M.D.   On: 01/08/2022 12:56  Assessment & Plan:    1. Left ureteral calculi The largest stone is 5 mm and she is asymptomatic. She would prefer a trial of passage.  KUB was ordered to see if stone visualized on plain film.  Rx tamsulosin  sent to pharmacy Although asymptomatic, she is getting ready to travel to the beach, and Rx hydrocodone  06/11/23 was sent to pharmacy. She will be notified with the KUB results and will call earlier for development of a renal column.  I have reviewed the above documentation for accuracy and completeness, and I agree with the above.   Glendia JAYSON Barba, MD  [facility] [address], [address] [place], [place] [address] [phone]

## 2023-08-19 NOTE — Patient Instructions (Signed)
 Get KUB in two weeks at [facility] outpatient imaging center

## 2023-08-20 ENCOUNTER — Encounter: Payer: Self-pay | Admitting: Urology

## 2023-08-26 ENCOUNTER — Encounter: Payer: Self-pay | Admitting: Advanced Practice Midwife

## 2023-08-31 ENCOUNTER — Ambulatory Visit
Admission: RE | Admit: 2023-08-31 | Discharge: 2023-08-31 | Disposition: A | Discharge status: Home / Self Care | Source: Ambulatory Visit | Attending: Urology | Admitting: Urology

## 2023-08-31 ENCOUNTER — Ambulatory Visit
Admission: RE | Admit: 2023-08-31 | Discharge: 2023-08-31 | Disposition: A | Discharge status: Home / Self Care | Attending: Urology | Admitting: Urology

## 2023-08-31 DIAGNOSIS — N200 Calculus of kidney: Secondary | ICD-10-CM | POA: Diagnosis present

## 2023-09-01 ENCOUNTER — Ambulatory Visit: Admitting: Urology

## 2023-09-05 ENCOUNTER — Ambulatory Visit: Attending: Orthopedic Surgery

## 2023-09-05 DIAGNOSIS — M6281 Muscle weakness (generalized): Secondary | ICD-10-CM | POA: Insufficient documentation

## 2023-09-05 DIAGNOSIS — G8929 Other chronic pain: Secondary | ICD-10-CM | POA: Diagnosis present

## 2023-09-05 DIAGNOSIS — M25662 Stiffness of left knee, not elsewhere classified: Secondary | ICD-10-CM | POA: Insufficient documentation

## 2023-09-05 DIAGNOSIS — M25562 Pain in left knee: Secondary | ICD-10-CM | POA: Insufficient documentation

## 2023-09-05 NOTE — Therapy (Signed)
 OUTPATIENT PHYSICAL THERAPY KNEE EVALUATION  Patient Name: Tiffany Acevedo MRN: [id number] DOB:12/23/1965, 58 y.o., female Today's Date: 09/05/2023  END OF SESSION:  PT End of Session - 09/05/23 1819     Visit Number 1    Number of Visits 17    Date for PT Re-Evaluation 10/31/23    Authorization Type Eval: 09/05/2023    PT Start Time 1820    PT Stop Time 1910    PT Time Calculation (min) 50 min    Activity Tolerance Patient tolerated treatment well    Behavior During Therapy [facility] for tasks assessed/performed          Past Medical History:  Diagnosis Date   Allergy    Anxiety    Breast cancer (HCC)    Depression    Excessive weight gain 06/30/2016   Family history of colon cancer    Family history of colon cancer    Family history of prostate cancer    Frequent headaches    GERD (gastroesophageal reflux disease)    History of kidney stones    Hypertension    Migraines    Pre-diabetes    Past Surgical History:  Procedure Laterality Date   BREAST BIOPSY Right 07/13/2019   stereo biopsy/ x clip/path pending   BREAST BIOPSY Right 07/13/2019   stereo biopsy/coil clip/ path pending   BREAST RECONSTRUCTION WITH PLACEMENT OF TISSUE EXPANDER AND FLEX HD (ACELLULAR HYDRATED DERMIS) Bilateral 08/27/2019   Procedure: BILATERAL BREAST RECONSTRUCTION WITH PLACEMENT OF TISSUE EXPANDER AND FLEX HD (ACELLULAR HYDRATED DERMIS);  Surgeon: Lowery Estefana RAMAN, DO;  Location: [facility];  Service: Plastics;  Laterality: Bilateral;   CESAREAN SECTION  1994, 2004, 2013   COLONOSCOPY  2015   CYSTOSCOPY W/ RETROGRADES Right 09/13/2018   Procedure: CYSTOSCOPY WITH RETROGRADE PYELOGRAM;  Surgeon: Twylla Glendia BROCKS, MD;  Location: [facility];  Service: Urology;  Laterality: Right;   CYSTOSCOPY/URETEROSCOPY/HOLMIUM LASER/STENT PLACEMENT Left 05/09/2018   Procedure: CYSTOSCOPY/URETEROSCOPY/HOLMIUM LASER/STENT PLACEMENT;  Surgeon: Twylla Glendia BROCKS, MD;  Location: [facility];  Service: Urology;   Laterality: Left;   CYSTOSCOPY/URETEROSCOPY/HOLMIUM LASER/STENT PLACEMENT Right 09/13/2018   Procedure: CYSTOSCOPY/URETEROSCOPY/HOLMIUM LASER/STENT PLACEMENT;  Surgeon: Twylla Glendia BROCKS, MD;  Location: [facility];  Service: Urology;  Laterality: Right;   INCONTINENCE SURGERY     BLADDER TUCK   LIPOSUCTION WITH LIPOFILLING Bilateral 05/05/2020   Procedure: LIPOSUCTION WITH LIPOFILLING; LIPOSUCTION OF EXCESS LATERAL BREAST TISSUE;  Surgeon: Lowery Estefana RAMAN, DO;  Location: [facility];  Service: Plastics;  Laterality: Bilateral;  90 min   PILONIDAL CYST EXCISION     REMOVAL OF BILATERAL TISSUE EXPANDERS WITH PLACEMENT OF BILATERAL BREAST IMPLANTS Bilateral 01/02/2020   Procedure: REMOVAL OF BILATERAL TISSUE EXPANDERS WITH PLACEMENT OF BILATERAL BREAST IMPLANTS;  Surgeon: Lowery Estefana RAMAN, DO;  Location: [facility];  Service: Plastics;  Laterality: Bilateral;  2 hours, please   STONE EXTRACTION WITH BASKET Right 09/13/2018   Procedure: STONE EXTRACTION WITH BASKET;  Surgeon: Twylla Glendia BROCKS, MD;  Location: [facility];  Service: Urology;  Laterality: Right;   TOTAL MASTECTOMY Bilateral 08/27/2019   Procedure: TOTAL MASTECTOMY;  Surgeon: Rodolph Romano, MD;  Location: [facility];  Service: General;  Laterality: Bilateral;  START @930  DUE TO SENTINEL NODE   UPPER GI ENDOSCOPY     Patient Active Problem List   Diagnosis Date Noted   Postmenopausal bleeding 10/29/2020   Breast asymmetry following reconstructive surgery 03/25/2020   S/P breast reconstruction, bilateral 03/06/2020   Sebaceous cyst 12/31/2019  Closed fracture of proximal end of left humerus 12/03/2019   Broken humerus 12/02/2019   Breast wound 10/05/2019   Acquired absence of bilateral breasts and nipples 09/04/2019   Genetic testing 08/10/2019   Monoallelic mutation of CHEK2 gene in female patient 08/07/2019   Malignant neoplasm of upper-outer quadrant of right breast in female, estrogen receptor  positive (HCC) 07/30/2019   Goals of care, counseling/discussion 07/30/2019   Family history of prostate cancer    Family history of colon cancer    Breast cancer (HCC) 07/25/2019   Left lower quadrant pain 05/23/2018   Personal history of kidney stones 04/20/2018   Microscopic hematuria 04/20/2018   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 01/22/2017   Overweight (BMI 25.0-29.9) 01/22/2017   Encounter for preventive health examination 06/30/2016   Renal colic 04/12/2015   Ureteral calculus 04/12/2015   Nephrolithiasis 03/04/2015   Intractable migraine with aura without status migrainosus 10/29/2013   Lack of libido 06/24/2013   Migraine syndrome 03/11/2013   Generalized anxiety disorder 03/11/2013    PCP: Marikay Eva POUR, PA  REFERRING PROVIDER: Marchia Drivers, MD  REFERRING DIAG:  [phone] (ICD-10-CM) - Unilateral primary osteoarthritis, left knee   RATIONALE FOR EVALUATION AND TREATMENT: Rehabilitation  THERAPY DIAG: Stiffness of left knee, not elsewhere classified  Muscle weakness (generalized)  Chronic pain of left knee  ONSET DATE: May 2025   FOLLOW-UP APPT SCHEDULED WITH REFERRING PROVIDER: No   SUBJECTIVE:                                                                                                                                                                                         SUBJECTIVE STATEMENT:    Patient reporting to OPPT with a chief concern of L knee pain.   PERTINENT HISTORY:   Ms. Tiffany Acevedo is a 58 year old female presenting to OPPT with Chronic L knee pain. She reports walking around [facility] all day and the next morning she had moderate to severe knee pain. R knee without relief following the trip. She went to [facility] and received steroid injection in the L knee, some relief provided.  She reports that a couple weeks she has been riding the [employer] bike. She has enjoyed the low intensity from the exercise bike and it has  helped decreased pain. She has been prescribed meloxicam in order to help reduce inflammation and pain BID. Aggravating Factors: sitting too long, internal rotation of the tibia (twisting when she has to get off pelaton).  Relieving Factors include: exercise bike. Patient reports audible popping in the L knee with sharp pain, but with quick resolution.  Imaging (Per Chart Review -  Krasinski (07/19/23)):     Diagnostic Studies: X-rays from the previous visit show moderate arthritis in both knees, with narrowing of the medial compartment more pronounced in the left knee. Moderate degenerative changes are also in the patellofemoral compartments of both knees.   Red Flags: Patient denies numbness and tingling, saddle paraesthesia, chills, fevers and nausea.    PAIN:   Pain Intensity: Present: 0/10, Best: 0/10, Worst: 7/10 Pain location: Left Knee Pain quality: intermittent and sharp  Radiating pain: Yes  Swelling: Yes  Popping, catching, locking: No  Numbness/Tingling: No Focal weakness or buckling: No 24-hour pain behavior: PM  How long can you stand: 1 hr  History of prior back, hip, or knee injury, pain, surgery, or therapy: Yes  PRECAUTIONS: Fall  WEIGHT BEARING RESTRICTIONS: No  FALLS: Has patient fallen in last 6 months? No  LIVING ENVIRONMENT: Lives with: lives with their spouse, 1 dog Lives in: House/apartment Stairs: Yes: Internal: 10 steps; can reach both and External: 3 steps; can reach both Has following equipment at home: None  OCCUPATION: Teacher  PLOF: Independent  PATIENT GOALS: Patient would like to squat/transfer from the floor with baby   OBJECTIVE:   Patient Surveys  LEFS: 65 / 80 = 81.3 %   Cognition WNL     Gross Musculoskeletal Assessment Tremor: None Bulk: Normal Tone: Normal  GAIT: Distance walked: 57m  Assistive device utilized: None Level of assistance: Complete Independence Comments: Narrow BOS, Reciprocal   Posture: Standing:  WNL   AROM AROM (Normal range in degrees) AROM  Hip Right Left  Flexion (125) WNL WNL  Extension (15)    Abduction (40)    Adduction     Internal Rotation (45) WNL WNL  External Rotation (45) WNL WNl      Knee    Flexion (135) WNL WNL  Extension (0) WNL WNL       Ankle    Dorsiflexion (20) WNL WNL  Plantarflexion (50) WNL WNL  Inversion (35)    Eversion (15    (* = pain; Blank rows = not tested)  LE MMT: MMT (out of 5) Right Left  Hip flexion 4 4  Hip extension    Hip abduction 4- 4-  Hip adduction    Hip internal rotation    Hip external rotation    Knee flexion 4 4  Knee extension 4 4  Ankle dorsiflexion 5 5  Ankle plantarflexion 5 5  Ankle inversion    Ankle eversion    (* = pain; Blank rows = not tested)  Sensation Grossly intact to light touch bilateral LEs as determined by testing dermatomes L2-S2. Proprioception, and hot/cold testing deferred on this date.  Reflexes R/L Knee Jerk (L3/4): 2+/2+  Ankle Jerk (S1/2): 2+/2+   Muscle Length Hamstrings: R: Negative L: Negative  Palpation Location LEFT  RIGHT           Quadriceps 0   Medial Hamstrings 0   Lateral Hamstrings 0   Lateral Hamstring tendon    Medial Hamstring tendon 1    Quadriceps tendon 0   Patella    Patellar Tendon    Tibial Tuberosity    Medial joint line 1   Lateral joint line    MCL    LCL    Adductor Tubercle    Pes Anserine tendon    Infrapatellar fat pad    Fibular head    Popliteal fossa 0   (Blank rows = not tested) Graded on 0-4 scale (0 =  no pain, 1 = pain, 2 = pain with wincing/grimacing/flinching, 3 = pain with withdrawal, 4 = unwilling to allow palpation), (Blank rows = not tested)  SPECIAL TESTS  Patellofemoral Pain Syndrome Squatting pain: R: Negative L: Negative Stair climbing pain: R: Not examined L: Negative Kneeling pain: R: Negative L: Negative Resisted knee extension pain: L: Negative Compression: R: Not examined L: Negative  TODAY'S  TREATMENT: DATE: 09/05/23  Reviewed HEP for hip strengthening:  - Clam with Resistance  - 1 x daily - 3-4 x weekly - 2-3 sets - 10-12 reps - Bridge with Resistance  - 1 x daily - 3-4 x weekly - 2-3 sets - 10-12 reps - Mini Squat  - 1 x daily - 7 x weekly - 2-3 sets - 10-12 reps - Upright Bike  - 1 x daily - 3-4 x weekly - 1 sets - 10-33min hold  PATIENT EDUCATION:  Education details: POC, HEP, Prognosis Person educated: Patient Education method: Explanation, Demonstration, and Handouts Education comprehension: verbalized understanding and returned demonstration   HOME EXERCISE PROGRAM:  Access Code: [email or web address] URL: [email or web address] Date: 09/05/2023 Prepared by: Lonni Pall  Exercises - Clam with Resistance  - 1 x daily - 3-4 x weekly - 2-3 sets - 10-12 reps - Bridge with Resistance  - 1 x daily - 3-4 x weekly - 2-3 sets - 10-12 reps - Mini Squat  - 1 x daily - 7 x weekly - 2-3 sets - 10-12 reps - Upright Bike  - 1 x daily - 3-4 x weekly - 1 sets - 10-27min hold  ASSESSMENT:  CLINICAL IMPRESSION: Patient is a 58 y.o. female who was seen today for physical therapy evaluation and treatment for chronic L knee pain. Patient's ROM grossly intact but generalized weakness throughout hip and knee musculature noted (see above at MMT). Pain concordant with internal rotation of tibia indicating compression at medial meniscal joint involvement. Palpation significant for pain in the medial hamstring tendon, increased with resisted knee flexion. Audible clicking with squatting, improved with lateral glide of patella in order to correct tracking. LEFS: 60-80 (75-100%): Minimal difficulty or near full function. Initial HEP provided for hip strengthening in order to strengthen lateral musculature to correct patella tracking. Great prognosis as patient is very active and has almost fully resolved her pain through exercises on [employer] bike. Based on today's performance, pt will  continue to benefit from skilled PT in order to facilitate return to PLOF and improve QoL.    OBJECTIVE IMPAIRMENTS: decreased activity tolerance, decreased balance, decreased strength, improper body mechanics, and pain.   ACTIVITY LIMITATIONS: bending, sitting, and squatting  PARTICIPATION LIMITATIONS: community activity and occupation  PERSONAL FACTORS: Age and Time since onset of injury/illness/exacerbation are also affecting patient's functional outcome.   REHAB POTENTIAL: Good  CLINICAL DECISION MAKING: Stable/uncomplicated  EVALUATION COMPLEXITY: Low   GOALS: Goals reviewed with patient? Yes  SHORT TERM GOALS: Target date: 10/03/2023  Pt will be independent with HEP to improve strength and decrease knee pain to improve pain-free function at home and work. Baseline: 09/05/2023: Initial HEP provided Goal status: INITIAL   LONG TERM GOALS: Target date: 10/31/2023  Patient will be able to perform floor to stand transfer without verbal cues and with a 10# kettlebell in order to demonstrate good transfer ability while carrying grand baby. Baseline:  Goal status: INITIAL  2.  Pt will decrease worst knee pain by at least 3 points on the NPRS in order to demonstrate clinically significant reduction in knee pain.  Baseline: 09/05/2023: 7/10 NPS Goal status: INITIAL  3.  Pt will increase LEFS score by at least 9 points in order demonstrate clinically significant reduction in knee pain/disability.       Baseline: 09/05/2023: 65 / 80 = 81.3 % Goal status: INITIAL  4.  Pt will increase strength of L knee ext/flex by at least 1/2 MMT grade in order to demonstrate improvement in strength and function  Baseline: 09/05/2023:  Goal status: INITIAL   PLAN: PT FREQUENCY: 1-2x/week  PT DURATION: 8 weeks  PLANNED INTERVENTIONS: Therapeutic exercises, Therapeutic activity, Neuromuscular re-education, Balance training, Gait training, Patient/Family education, Self Care, Joint  mobilization, Joint manipulation, Vestibular training, Canalith repositioning, Orthotic/Fit training, DME instructions, Dry Needling, Electrical stimulation, Spinal manipulation, Spinal mobilization, Cryotherapy, Moist heat, Taping, Traction, Ultrasound, Ionotophoresis 4mg /ml Dexamethasone , Manual therapy, and Re-evaluation.  PLAN FOR NEXT SESSION: Review HEP; Initiate Gluteal strength; Squatting mechanics; Floor transfer    Lonni Pall PT, DPT Physical Therapist- [place]  09/05/2023, 7:29 PM

## 2023-09-07 ENCOUNTER — Ambulatory Visit

## 2023-09-07 DIAGNOSIS — M25662 Stiffness of left knee, not elsewhere classified: Secondary | ICD-10-CM | POA: Diagnosis not present

## 2023-09-07 DIAGNOSIS — G8929 Other chronic pain: Secondary | ICD-10-CM

## 2023-09-07 DIAGNOSIS — M6281 Muscle weakness (generalized): Secondary | ICD-10-CM

## 2023-09-07 NOTE — Therapy (Signed)
 OUTPATIENT PHYSICAL THERAPY KNEE TREATMENT  Patient Name: Tiffany Acevedo MRN: [id number] DOB:1965/02/19, 58 y.o., female Today's Date: 09/07/2023  END OF SESSION:  PT End of Session - 09/07/23 1523     Visit Number 2    Number of Visits 17    Date for PT Re-Evaluation 10/31/23    Authorization Type Eval: 09/05/2023    PT Start Time 1520    PT Stop Time 1600    PT Time Calculation (min) 40 min    Activity Tolerance Patient tolerated treatment well    Behavior During Therapy [facility] for tasks assessed/performed           Past Medical History:  Diagnosis Date   Allergy    Anxiety    Breast cancer (HCC)    Depression    Excessive weight gain 06/30/2016   Family history of colon cancer    Family history of colon cancer    Family history of prostate cancer    Frequent headaches    GERD (gastroesophageal reflux disease)    History of kidney stones    Hypertension    Migraines    Pre-diabetes    Past Surgical History:  Procedure Laterality Date   BREAST BIOPSY Right 07/13/2019   stereo biopsy/ x clip/path pending   BREAST BIOPSY Right 07/13/2019   stereo biopsy/coil clip/ path pending   BREAST RECONSTRUCTION WITH PLACEMENT OF TISSUE EXPANDER AND FLEX HD (ACELLULAR HYDRATED DERMIS) Bilateral 08/27/2019   Procedure: BILATERAL BREAST RECONSTRUCTION WITH PLACEMENT OF TISSUE EXPANDER AND FLEX HD (ACELLULAR HYDRATED DERMIS);  Surgeon: Lowery Estefana RAMAN, DO;  Location: [facility];  Service: Plastics;  Laterality: Bilateral;   CESAREAN SECTION  1994, 2004, 2013   COLONOSCOPY  2015   CYSTOSCOPY W/ RETROGRADES Right 09/13/2018   Procedure: CYSTOSCOPY WITH RETROGRADE PYELOGRAM;  Surgeon: Twylla Glendia BROCKS, MD;  Location: [facility];  Service: Urology;  Laterality: Right;   CYSTOSCOPY/URETEROSCOPY/HOLMIUM LASER/STENT PLACEMENT Left 05/09/2018   Procedure: CYSTOSCOPY/URETEROSCOPY/HOLMIUM LASER/STENT PLACEMENT;  Surgeon: Twylla Glendia BROCKS, MD;  Location: [facility];  Service: Urology;   Laterality: Left;   CYSTOSCOPY/URETEROSCOPY/HOLMIUM LASER/STENT PLACEMENT Right 09/13/2018   Procedure: CYSTOSCOPY/URETEROSCOPY/HOLMIUM LASER/STENT PLACEMENT;  Surgeon: Twylla Glendia BROCKS, MD;  Location: [facility];  Service: Urology;  Laterality: Right;   INCONTINENCE SURGERY     BLADDER TUCK   LIPOSUCTION WITH LIPOFILLING Bilateral 05/05/2020   Procedure: LIPOSUCTION WITH LIPOFILLING; LIPOSUCTION OF EXCESS LATERAL BREAST TISSUE;  Surgeon: Lowery Estefana RAMAN, DO;  Location: [facility];  Service: Plastics;  Laterality: Bilateral;  90 min   PILONIDAL CYST EXCISION     REMOVAL OF BILATERAL TISSUE EXPANDERS WITH PLACEMENT OF BILATERAL BREAST IMPLANTS Bilateral 01/02/2020   Procedure: REMOVAL OF BILATERAL TISSUE EXPANDERS WITH PLACEMENT OF BILATERAL BREAST IMPLANTS;  Surgeon: Lowery Estefana RAMAN, DO;  Location: [facility];  Service: Plastics;  Laterality: Bilateral;  2 hours, please   STONE EXTRACTION WITH BASKET Right 09/13/2018   Procedure: STONE EXTRACTION WITH BASKET;  Surgeon: Twylla Glendia BROCKS, MD;  Location: [facility];  Service: Urology;  Laterality: Right;   TOTAL MASTECTOMY Bilateral 08/27/2019   Procedure: TOTAL MASTECTOMY;  Surgeon: Rodolph Romano, MD;  Location: [facility];  Service: General;  Laterality: Bilateral;  START @930  DUE TO SENTINEL NODE   UPPER GI ENDOSCOPY     Patient Active Problem List   Diagnosis Date Noted   Postmenopausal bleeding 10/29/2020   Breast asymmetry following reconstructive surgery 03/25/2020   S/P breast reconstruction, bilateral 03/06/2020   Sebaceous cyst 12/31/2019  Closed fracture of proximal end of left humerus 12/03/2019   Broken humerus 12/02/2019   Breast wound 10/05/2019   Acquired absence of bilateral breasts and nipples 09/04/2019   Genetic testing 08/10/2019   Monoallelic mutation of CHEK2 gene in female patient 08/07/2019   Malignant neoplasm of upper-outer quadrant of right breast in female, estrogen receptor  positive (HCC) 07/30/2019   Goals of care, counseling/discussion 07/30/2019   Family history of prostate cancer    Family history of colon cancer    Breast cancer (HCC) 07/25/2019   Left lower quadrant pain 05/23/2018   Personal history of kidney stones 04/20/2018   Microscopic hematuria 04/20/2018   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 01/22/2017   Overweight (BMI 25.0-29.9) 01/22/2017   Encounter for preventive health examination 06/30/2016   Renal colic 04/12/2015   Ureteral calculus 04/12/2015   Nephrolithiasis 03/04/2015   Intractable migraine with aura without status migrainosus 10/29/2013   Lack of libido 06/24/2013   Migraine syndrome 03/11/2013   Generalized anxiety disorder 03/11/2013    PCP: Marikay Eva POUR, PA  REFERRING PROVIDER: Marchia Drivers, MD  REFERRING DIAG:  [phone] (ICD-10-CM) - Unilateral primary osteoarthritis, left knee   RATIONALE FOR EVALUATION AND TREATMENT: Rehabilitation  THERAPY DIAG: Stiffness of left knee, not elsewhere classified  Muscle weakness (generalized)  Chronic pain of left knee  ONSET DATE: May 2025   FOLLOW-UP APPT SCHEDULED WITH REFERRING PROVIDER: No   SUBJECTIVE:                                                                                                                                                                                         SUBJECTIVE STATEMENT:    Patient reporting to OPPT with a chief concern of L knee pain.   PERTINENT HISTORY:   Ms. Tiffany Acevedo is a 58 year old female presenting to OPPT with Chronic L knee pain. She reports walking around [facility] all day and the next morning she had moderate to severe knee pain. R knee without relief following the trip. She went to [facility] and received steroid injection in the L knee, some relief provided.  She reports that a couple weeks she has been riding the [employer] bike. She has enjoyed the low intensity from the exercise bike and it has  helped decreased pain. She has been prescribed meloxicam in order to help reduce inflammation and pain BID. Aggravating Factors: sitting too long, internal rotation of the tibia (twisting when she has to get off pelaton).  Relieving Factors include: exercise bike. Patient reports audible popping in the L knee with sharp pain, but with quick resolution.  Imaging (Per Chart Review -  Krasinski (07/19/23)):     Diagnostic Studies: X-rays from the previous visit show moderate arthritis in both knees, with narrowing of the medial compartment more pronounced in the left knee. Moderate degenerative changes are also in the patellofemoral compartments of both knees.   Red Flags: Patient denies numbness and tingling, saddle paraesthesia, chills, fevers and nausea.    PAIN:   Pain Intensity: Present: 0/10, Best: 0/10, Worst: 7/10 Pain location: Left Knee Pain quality: intermittent and sharp  Radiating pain: Yes  Swelling: Yes  Popping, catching, locking: No  Numbness/Tingling: No Focal weakness or buckling: No 24-hour pain behavior: PM  How long can you stand: 1 hr  History of prior back, hip, or knee injury, pain, surgery, or therapy: Yes  PRECAUTIONS: Fall  WEIGHT BEARING RESTRICTIONS: No  FALLS: Has patient fallen in last 6 months? No  LIVING ENVIRONMENT: Lives with: lives with their spouse, 1 dog Lives in: House/apartment Stairs: Yes: Internal: 10 steps; can reach both and External: 3 steps; can reach both Has following equipment at home: None  OCCUPATION: Teacher  PLOF: Independent  PATIENT GOALS: Patient would like to squat/transfer from the floor with baby   OBJECTIVE:   Patient Surveys  LEFS: 65 / 80 = 81.3 %   Cognition WNL     Gross Musculoskeletal Assessment Tremor: None Bulk: Normal Tone: Normal  GAIT: Distance walked: 59m  Assistive device utilized: None Level of assistance: Complete Independence Comments: Narrow BOS, Reciprocal   Posture: Standing:  WNL   AROM AROM (Normal range in degrees) AROM  Hip Right Left  Flexion (125) WNL WNL  Extension (15)    Abduction (40)    Adduction     Internal Rotation (45) WNL WNL  External Rotation (45) WNL WNl      Knee    Flexion (135) WNL WNL  Extension (0) WNL WNL       Ankle    Dorsiflexion (20) WNL WNL  Plantarflexion (50) WNL WNL  Inversion (35)    Eversion (15    (* = pain; Blank rows = not tested)  LE MMT: MMT (out of 5) Right Left  Hip flexion 4 4  Hip extension    Hip abduction 4- 4-  Hip adduction    Hip internal rotation    Hip external rotation    Knee flexion 4 4  Knee extension 4 4  Ankle dorsiflexion 5 5  Ankle plantarflexion 5 5  Ankle inversion    Ankle eversion    (* = pain; Blank rows = not tested)  Sensation Grossly intact to light touch bilateral LEs as determined by testing dermatomes L2-S2. Proprioception, and hot/cold testing deferred on this date.  Reflexes R/L Knee Jerk (L3/4): 2+/2+  Ankle Jerk (S1/2): 2+/2+   Muscle Length Hamstrings: R: Negative L: Negative  Palpation Location LEFT  RIGHT           Quadriceps 0   Medial Hamstrings 0   Lateral Hamstrings 0   Lateral Hamstring tendon    Medial Hamstring tendon 1    Quadriceps tendon 0   Patella    Patellar Tendon    Tibial Tuberosity    Medial joint line 1   Lateral joint line    MCL    LCL    Adductor Tubercle    Pes Anserine tendon    Infrapatellar fat pad    Fibular head    Popliteal fossa 0   (Blank rows = not tested) Graded on 0-4 scale (0 =  no pain, 1 = pain, 2 = pain with wincing/grimacing/flinching, 3 = pain with withdrawal, 4 = unwilling to allow palpation), (Blank rows = not tested)  SPECIAL TESTS  Patellofemoral Pain Syndrome Squatting pain: R: Negative L: Negative Stair climbing pain: R: Not examined L: Negative Kneeling pain: R: Negative L: Negative Resisted knee extension pain: L: Negative Compression: R: Not examined L: Negative  TODAY'S  TREATMENT: DATE: 09/07/23  SUBJECTIVE:  Patient reports moderate soreness in the bilateral thighs. She performed HEP yesterday with no difficulties. She reports 0/10 NPS in the L knee. No further questions or concerns.    Therapeutic Exercise:  Matrix Exercise Bike L5-2 x 5 min (Seat 9) for LE warm up, strength and endurance; PT manually adjusted resistance throughout to patient tolerance.   Supine Bridge   2 x 10 - Blue TB     Clamshell w/ Resistance    L: 2 x 10, Blue TB     Sidelying Hip Abduction - Straight Leg    L : 2 x 10 - Red TB      Lock Clamshell    L Leg : 2 x 10 AROM     Supine Quadricep Stretch    R/L: 30s/bout x 1  in order to improve ROM and tissue extensibility    Reviewed additional exercises added to HEP; pt with verbal understanding of stretches and new exercises.      Therapeutic Activity:   Partial Squats with Resistance   2 x 10 - 5# DB ea UE   Lateral Stepping against resistance    2 x 38', Blue TB thigh   2 x 38', BTB around thigh   Lateral Step Down   R/L: 1 x 10 ea leg, minor pain in the L knee  PATIENT EDUCATION:  Education details: HEP, exercise technique  Person educated: Patient Education method: Explanation, Demonstration, and Handouts Education comprehension: verbalized understanding and returned demonstration   HOME EXERCISE PROGRAM:  Access Code: [email or web address] URL: [email or web address] Date: 09/07/2023 Prepared by: Lonni Dillan Lunden  Exercises - Lateral Step Down  - 1 x daily - 2-3 x weekly - 2-3 sets - 5-10 reps - Clam with Resistance  - 1 x daily - 3-4 x weekly - 2-3 sets - 10-12 reps - Bridge with Resistance  - 1 x daily - 3-4 x weekly - 2-3 sets - 10-12 reps - Mini Squat  - 1 x daily - 7 x weekly - 2-3 sets - 10-12 reps - Side Stepping with Resistance at Thighs  - 1 x daily - 7 x weekly - 3 sets - 12 reps - Upright Bike  - 1 x daily - 3-4 x weekly - 1 sets - 10-26min hold - Supine Quadriceps Stretch with Strap  on Table  - 2 x daily - 7 x weekly - 3 sets - 30 hold - Supine Gluteus Stretch  - 2 x daily - 7 x weekly - 2-3 sets - 30 hold  ASSESSMENT:  CLINICAL IMPRESSION: Patient arrives to OPPT for her first f/u in management of L knee pain. PT focused on reviewing HEP and initiating LE strength protocol in order to reduce L knee pain. Good return demonstration of technique with all HEP exercises. She tolerated all interventions targeted on strengthening gluteal musculature. Lateral Step down on R knee with minor pain in the knee but mitigated with rest break. Supine stretches in order to mitigate tension along quadriceps muscle group. Good prognosis as patient has great adherence to HEP and continues  to use [employer] bike for exercises. PT will continues to progress as tolerated and monitor progress. Based on today's performance, pt will continue to benefit from skilled PT in order to facilitate return to PLOF and improve QoL.   OBJECTIVE IMPAIRMENTS: decreased activity tolerance, decreased balance, decreased strength, improper body mechanics, and pain.   ACTIVITY LIMITATIONS: bending, sitting, and squatting  PARTICIPATION LIMITATIONS: community activity and occupation  PERSONAL FACTORS: Age and Time since onset of injury/illness/exacerbation are also affecting patient's functional outcome.   REHAB POTENTIAL: Good  CLINICAL DECISION MAKING: Stable/uncomplicated  EVALUATION COMPLEXITY: Low   GOALS: Goals reviewed with patient? Yes  SHORT TERM GOALS: Target date: 10/05/2023  Pt will be independent with HEP to improve strength and decrease knee pain to improve pain-free function at home and work. Baseline: 09/05/2023: Initial HEP provided Goal status: INITIAL   LONG TERM GOALS: Target date: 11/02/2023  Patient will be able to perform floor to stand transfer without verbal cues and with a 10# kettlebell in order to demonstrate good transfer ability while carrying grand baby. Baseline:  Goal  status: INITIAL  2.  Pt will decrease worst knee pain by at least 3 points on the NPRS in order to demonstrate clinically significant reduction in knee pain. Baseline: 09/05/2023: 7/10 NPS Goal status: INITIAL  3.  Pt will increase LEFS score by at least 9 points in order demonstrate clinically significant reduction in knee pain/disability.       Baseline: 09/05/2023: 65 / 80 = 81.3 % Goal status: INITIAL  4.  Pt will increase strength of L knee ext/flex by at least 1/2 MMT grade in order to demonstrate improvement in strength and function  Baseline: 09/05/2023: L Knee Flex/Ext: 4/5,4/5 Goal status: INITIAL   PLAN: PT FREQUENCY: 1-2x/week  PT DURATION: 8 weeks  PLANNED INTERVENTIONS: Therapeutic exercises, Therapeutic activity, Neuromuscular re-education, Balance training, Gait training, Patient/Family education, Self Care, Joint mobilization, Joint manipulation, Vestibular training, Canalith repositioning, Orthotic/Fit training, DME instructions, Dry Needling, Electrical stimulation, Spinal manipulation, Spinal mobilization, Cryotherapy, Moist heat, Taping, Traction, Ultrasound, Ionotophoresis 4mg /ml Dexamethasone , Manual therapy, and Re-evaluation.  PLAN FOR NEXT SESSION: Review HEP; Initiate Gluteal strength; Squatting mechanics; Floor transfer    Lonni Pall PT, DPT Physical Therapist- [place]  09/07/2023, 3:24 PM

## 2023-09-11 ENCOUNTER — Ambulatory Visit: Payer: Self-pay | Admitting: Urology

## 2023-09-19 ENCOUNTER — Ambulatory Visit: Attending: Orthopedic Surgery

## 2023-09-19 DIAGNOSIS — M25662 Stiffness of left knee, not elsewhere classified: Secondary | ICD-10-CM | POA: Diagnosis present

## 2023-09-19 DIAGNOSIS — G8929 Other chronic pain: Secondary | ICD-10-CM | POA: Diagnosis present

## 2023-09-19 DIAGNOSIS — M6281 Muscle weakness (generalized): Secondary | ICD-10-CM | POA: Insufficient documentation

## 2023-09-19 DIAGNOSIS — M25562 Pain in left knee: Secondary | ICD-10-CM | POA: Insufficient documentation

## 2023-09-19 NOTE — Therapy (Signed)
 OUTPATIENT PHYSICAL THERAPY KNEE TREATMENT  Patient Name: Tiffany Acevedo MRN: [id number] DOB:October 20, 1965, 58 y.o., female Today's Date: 09/19/2023  END OF SESSION:  PT End of Session - 09/19/23 1742     Visit Number 3    Number of Visits 17    Date for PT Re-Evaluation 10/31/23    Authorization Type Eval: 09/05/2023    PT Start Time 1740    PT Stop Time 1815    PT Time Calculation (min) 35 min    Activity Tolerance Patient tolerated treatment well    Behavior During Therapy [facility] for tasks assessed/performed            Past Medical History:  Diagnosis Date   Allergy    Anxiety    Breast cancer (HCC)    Depression    Excessive weight gain 06/30/2016   Family history of colon cancer    Family history of colon cancer    Family history of prostate cancer    Frequent headaches    GERD (gastroesophageal reflux disease)    History of kidney stones    Hypertension    Migraines    Pre-diabetes    Past Surgical History:  Procedure Laterality Date   BREAST BIOPSY Right 07/13/2019   stereo biopsy/ x clip/path pending   BREAST BIOPSY Right 07/13/2019   stereo biopsy/coil clip/ path pending   BREAST RECONSTRUCTION WITH PLACEMENT OF TISSUE EXPANDER AND FLEX HD (ACELLULAR HYDRATED DERMIS) Bilateral 08/27/2019   Procedure: BILATERAL BREAST RECONSTRUCTION WITH PLACEMENT OF TISSUE EXPANDER AND FLEX HD (ACELLULAR HYDRATED DERMIS);  Surgeon: Lowery Estefana RAMAN, DO;  Location: [facility];  Service: Plastics;  Laterality: Bilateral;   CESAREAN SECTION  1994, 2004, 2013   COLONOSCOPY  2015   CYSTOSCOPY W/ RETROGRADES Right 09/13/2018   Procedure: CYSTOSCOPY WITH RETROGRADE PYELOGRAM;  Surgeon: Twylla Glendia BROCKS, MD;  Location: [facility];  Service: Urology;  Laterality: Right;   CYSTOSCOPY/URETEROSCOPY/HOLMIUM LASER/STENT PLACEMENT Left 05/09/2018   Procedure: CYSTOSCOPY/URETEROSCOPY/HOLMIUM LASER/STENT PLACEMENT;  Surgeon: Twylla Glendia BROCKS, MD;  Location: [facility];  Service: Urology;   Laterality: Left;   CYSTOSCOPY/URETEROSCOPY/HOLMIUM LASER/STENT PLACEMENT Right 09/13/2018   Procedure: CYSTOSCOPY/URETEROSCOPY/HOLMIUM LASER/STENT PLACEMENT;  Surgeon: Twylla Glendia BROCKS, MD;  Location: [facility];  Service: Urology;  Laterality: Right;   INCONTINENCE SURGERY     BLADDER TUCK   LIPOSUCTION WITH LIPOFILLING Bilateral 05/05/2020   Procedure: LIPOSUCTION WITH LIPOFILLING; LIPOSUCTION OF EXCESS LATERAL BREAST TISSUE;  Surgeon: Lowery Estefana RAMAN, DO;  Location: [facility];  Service: Plastics;  Laterality: Bilateral;  90 min   PILONIDAL CYST EXCISION     REMOVAL OF BILATERAL TISSUE EXPANDERS WITH PLACEMENT OF BILATERAL BREAST IMPLANTS Bilateral 01/02/2020   Procedure: REMOVAL OF BILATERAL TISSUE EXPANDERS WITH PLACEMENT OF BILATERAL BREAST IMPLANTS;  Surgeon: Lowery Estefana RAMAN, DO;  Location: [facility];  Service: Plastics;  Laterality: Bilateral;  2 hours, please   STONE EXTRACTION WITH BASKET Right 09/13/2018   Procedure: STONE EXTRACTION WITH BASKET;  Surgeon: Twylla Glendia BROCKS, MD;  Location: [facility];  Service: Urology;  Laterality: Right;   TOTAL MASTECTOMY Bilateral 08/27/2019   Procedure: TOTAL MASTECTOMY;  Surgeon: Rodolph Romano, MD;  Location: [facility];  Service: General;  Laterality: Bilateral;  START @930  DUE TO SENTINEL NODE   UPPER GI ENDOSCOPY     Patient Active Problem List   Diagnosis Date Noted   Postmenopausal bleeding 10/29/2020   Breast asymmetry following reconstructive surgery 03/25/2020   S/P breast reconstruction, bilateral 03/06/2020   Sebaceous cyst  12/31/2019   Closed fracture of proximal end of left humerus 12/03/2019   Broken humerus 12/02/2019   Breast wound 10/05/2019   Acquired absence of bilateral breasts and nipples 09/04/2019   Genetic testing 08/10/2019   Monoallelic mutation of CHEK2 gene in female patient 08/07/2019   Malignant neoplasm of upper-outer quadrant of right breast in female, estrogen receptor  positive (HCC) 07/30/2019   Goals of care, counseling/discussion 07/30/2019   Family history of prostate cancer    Family history of colon cancer    Breast cancer (HCC) 07/25/2019   Left lower quadrant pain 05/23/2018   Personal history of kidney stones 04/20/2018   Microscopic hematuria 04/20/2018   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 01/22/2017   Overweight (BMI 25.0-29.9) 01/22/2017   Encounter for preventive health examination 06/30/2016   Renal colic 04/12/2015   Ureteral calculus 04/12/2015   Nephrolithiasis 03/04/2015   Intractable migraine with aura without status migrainosus 10/29/2013   Lack of libido 06/24/2013   Migraine syndrome 03/11/2013   Generalized anxiety disorder 03/11/2013    PCP: Marikay Eva POUR, PA  REFERRING PROVIDER: Marchia Drivers, MD  REFERRING DIAG:  [phone] (ICD-10-CM) - Unilateral primary osteoarthritis, left knee   RATIONALE FOR EVALUATION AND TREATMENT: Rehabilitation  THERAPY DIAG: Stiffness of left knee, not elsewhere classified  Muscle weakness (generalized)  Chronic pain of left knee  ONSET DATE: May 2025   FOLLOW-UP APPT SCHEDULED WITH REFERRING PROVIDER: No   SUBJECTIVE:                                                                                                                                                                                         SUBJECTIVE STATEMENT:    Patient reporting to OPPT with a chief concern of L knee pain.   PERTINENT HISTORY:   Ms. Tiffany Acevedo is a 58 year old female presenting to OPPT with Chronic L knee pain. She reports walking around [facility] all day and the next morning she had moderate to severe knee pain. R knee without relief following the trip. She went to [facility] and received steroid injection in the L knee, some relief provided.  She reports that a couple weeks she has been riding the [employer] bike. She has enjoyed the low intensity from the exercise bike and it has  helped decreased pain. She has been prescribed meloxicam in order to help reduce inflammation and pain BID. Aggravating Factors: sitting too long, internal rotation of the tibia (twisting when she has to get off pelaton).  Relieving Factors include: exercise bike. Patient reports audible popping in the L knee with sharp pain, but with quick resolution.  Imaging (  Per Chart Review - Krasinski (07/19/23)):     Diagnostic Studies: X-rays from the previous visit show moderate arthritis in both knees, with narrowing of the medial compartment more pronounced in the left knee. Moderate degenerative changes are also in the patellofemoral compartments of both knees.   Red Flags: Patient denies numbness and tingling, saddle paraesthesia, chills, fevers and nausea.    PAIN:   Pain Intensity: Present: 0/10, Best: 0/10, Worst: 7/10 Pain location: Left Knee Pain quality: intermittent and sharp  Radiating pain: Yes  Swelling: Yes  Popping, catching, locking: No  Numbness/Tingling: No Focal weakness or buckling: No 24-hour pain behavior: PM  How long can you stand: 1 hr  History of prior back, hip, or knee injury, pain, surgery, or therapy: Yes  PRECAUTIONS: Fall  WEIGHT BEARING RESTRICTIONS: No  FALLS: Has patient fallen in last 6 months? No  LIVING ENVIRONMENT: Lives with: lives with their spouse, 1 dog Lives in: House/apartment Stairs: Yes: Internal: 10 steps; can reach both and External: 3 steps; can reach both Has following equipment at home: None  OCCUPATION: Teacher  PLOF: Independent  PATIENT GOALS: Patient would like to squat/transfer from the floor with baby   OBJECTIVE:   Patient Surveys  LEFS: 65 / 80 = 81.3 %   Cognition WNL     Gross Musculoskeletal Assessment Tremor: None Bulk: Normal Tone: Normal  GAIT: Distance walked: 79m  Assistive device utilized: None Level of assistance: Complete Independence Comments: Narrow BOS, Reciprocal   Posture: Standing:  WNL   AROM AROM (Normal range in degrees) AROM  Hip Right Left  Flexion (125) WNL WNL  Extension (15)    Abduction (40)    Adduction     Internal Rotation (45) WNL WNL  External Rotation (45) WNL WNl      Knee    Flexion (135) WNL WNL  Extension (0) WNL WNL       Ankle    Dorsiflexion (20) WNL WNL  Plantarflexion (50) WNL WNL  Inversion (35)    Eversion (15    (* = pain; Blank rows = not tested)  LE MMT: MMT (out of 5) Right Left  Hip flexion 4 4  Hip extension    Hip abduction 4- 4-  Hip adduction    Hip internal rotation    Hip external rotation    Knee flexion 4 4  Knee extension 4 4  Ankle dorsiflexion 5 5  Ankle plantarflexion 5 5  Ankle inversion    Ankle eversion    (* = pain; Blank rows = not tested)  Sensation Grossly intact to light touch bilateral LEs as determined by testing dermatomes L2-S2. Proprioception, and hot/cold testing deferred on this date.  Reflexes R/L Knee Jerk (L3/4): 2+/2+  Ankle Jerk (S1/2): 2+/2+   Muscle Length Hamstrings: R: Negative L: Negative  Palpation Location LEFT  RIGHT           Quadriceps 0   Medial Hamstrings 0   Lateral Hamstrings 0   Lateral Hamstring tendon    Medial Hamstring tendon 1    Quadriceps tendon 0   Patella    Patellar Tendon    Tibial Tuberosity    Medial joint line 1   Lateral joint line    MCL    LCL    Adductor Tubercle    Pes Anserine tendon    Infrapatellar fat pad    Fibular head    Popliteal fossa 0   (Blank rows = not tested) Graded  on 0-4 scale (0 = no pain, 1 = pain, 2 = pain with wincing/grimacing/flinching, 3 = pain with withdrawal, 4 = unwilling to allow palpation), (Blank rows = not tested)  SPECIAL TESTS  Patellofemoral Pain Syndrome Squatting pain: R: Negative L: Negative Stair climbing pain: R: Not examined L: Negative Kneeling pain: R: Negative L: Negative Resisted knee extension pain: L: Negative Compression: R: Not examined L: Negative  TODAY'S  TREATMENT: DATE: 09/19/23  SUBJECTIVE:  Patient returns to OPPT following one week vacation to [facility]. She reports prolonged walking at [facility]; notable soreness in bilateral knees (3/10 NPS). Some hip pain on the R. No further questions or concerns.   Therapeutic Exercise:  Supine Bridge against resistance    1 x 10 - Black TB   R: 2 x 10, Single Leg Bridge against TB - LLE Extended    Sidelying Hip Abduction - Straight Leg    L : 2 x 10 - Black TB    Lock Clamshell    L Leg : 2 x 10 AROM    R Leg: 2 x 10 AROM    Prone Hip Extension    R/L: 1 x 8 ea     - Minor pain in the R iliac crest region    Supine Straight Leg Raise   R/L: 2 x 10 reps, without hip Pain      Supine Hip Flexor Stretch (Off edge of mat table)    30s/bout x 3 in order to improve ROM and tissue extensibility   Reviewed additional exercises added to HEP; pt with verbal understanding of stretches and new exercises.      Therapeutic Activity:  Matrix Exercise Bike L5-2 x 5 min (Seat 9) for LE warm up, strength and endurance; PT manually adjusted resistance throughout to patient tolerance.    Lateral Step Down - BUE Support   R/L: 2 x 10 ea leg    PATIENT EDUCATION:  Education details: HEP, exercise technique  Person educated: Patient Education method: Explanation, Demonstration, and Handouts Education comprehension: verbalized understanding and returned demonstration   HOME EXERCISE PROGRAM:  Access Code: [email or web address] URL: [email or web address] Date: 09/07/2023 Prepared by: Lonni Laparis Durrett  Exercises - Lateral Step Down  - 1 x daily - 2-3 x weekly - 2-3 sets - 5-10 reps - Clam with Resistance  - 1 x daily - 3-4 x weekly - 2-3 sets - 10-12 reps - Bridge with Resistance  - 1 x daily - 3-4 x weekly - 2-3 sets - 10-12 reps - Mini Squat  - 1 x daily - 7 x weekly - 2-3 sets - 10-12 reps - Side Stepping with Resistance at Thighs  - 1 x daily - 7 x weekly - 3 sets - 12 reps - Upright Bike   - 1 x daily - 3-4 x weekly - 1 sets - 10-18min hold - Supine Quadriceps Stretch with Strap on Table  - 2 x daily - 7 x weekly - 3 sets - 30 hold - Supine Gluteus Stretch  - 2 x daily - 7 x weekly - 2-3 sets - 30 hold  ASSESSMENT:  CLINICAL IMPRESSION: Continued PT POC focused on increasing gluteal strength in order to improve knee stability and patellar tracking. She tolerated all interventions well without exacerbation of L knee pain. Notable pain in the R lower abdominal quadrant with prone hip extensions. + tenderness at Mcburney's point however Pain mitigated with AROM hip flexion into supine hip flexor stretch. PT educated patient on hip  flexor stretch using step/stair step. Updated HEP to include additional strengthening exercise. She still has good prognosis of return to PLOF with adherence to HEP and active lifestyle using Pelaton. PT will continues to progress as tolerated and monitor progress. Based on today's performance, pt will continue to benefit from skilled PT in order to facilitate return to PLOF and improve QoL.   OBJECTIVE IMPAIRMENTS: decreased activity tolerance, decreased balance, decreased strength, improper body mechanics, and pain.   ACTIVITY LIMITATIONS: bending, sitting, and squatting  PARTICIPATION LIMITATIONS: community activity and occupation  PERSONAL FACTORS: Age and Time since onset of injury/illness/exacerbation are also affecting patient's functional outcome.   REHAB POTENTIAL: Good  CLINICAL DECISION MAKING: Stable/uncomplicated  EVALUATION COMPLEXITY: Low   GOALS: Goals reviewed with patient? Yes  SHORT TERM GOALS: Target date: 10/17/2023  Pt will be independent with HEP to improve strength and decrease knee pain to improve pain-free function at home and work. Baseline: 09/05/2023: Initial HEP provided Goal status: INITIAL   LONG TERM GOALS: Target date: 11/14/2023  Patient will be able to perform floor to stand transfer without verbal cues and  with a 10# kettlebell in order to demonstrate good transfer ability while carrying grand baby. Baseline:  Goal status: INITIAL  2.  Pt will decrease worst knee pain by at least 3 points on the NPRS in order to demonstrate clinically significant reduction in knee pain. Baseline: 09/05/2023: 7/10 NPS Goal status: INITIAL  3.  Pt will increase LEFS score by at least 9 points in order demonstrate clinically significant reduction in knee pain/disability.       Baseline: 09/05/2023: 65 / 80 = 81.3 % Goal status: INITIAL  4.  Pt will increase strength of L knee ext/flex by at least 1/2 MMT grade in order to demonstrate improvement in strength and function  Baseline: 09/05/2023: L Knee Flex/Ext: 4/5,4/5 Goal status: INITIAL   PLAN: PT FREQUENCY: 1-2x/week  PT DURATION: 8 weeks  PLANNED INTERVENTIONS: Therapeutic exercises, Therapeutic activity, Neuromuscular re-education, Balance training, Gait training, Patient/Family education, Self Care, Joint mobilization, Joint manipulation, Vestibular training, Canalith repositioning, Orthotic/Fit training, DME instructions, Dry Needling, Electrical stimulation, Spinal manipulation, Spinal mobilization, Cryotherapy, Moist heat, Taping, Traction, Ultrasound, Ionotophoresis 4mg /ml Dexamethasone , Manual therapy, and Re-evaluation.  PLAN FOR NEXT SESSION: Progress    Lonni Pall PT, DPT Physical Therapist- [place]  09/19/2023, 5:43 PM

## 2023-09-21 ENCOUNTER — Ambulatory Visit

## 2023-09-21 DIAGNOSIS — M6281 Muscle weakness (generalized): Secondary | ICD-10-CM

## 2023-09-21 DIAGNOSIS — M25662 Stiffness of left knee, not elsewhere classified: Secondary | ICD-10-CM | POA: Diagnosis not present

## 2023-09-21 DIAGNOSIS — G8929 Other chronic pain: Secondary | ICD-10-CM

## 2023-09-21 NOTE — Therapy (Signed)
 OUTPATIENT PHYSICAL THERAPY KNEE TREATMENT  Patient Name: Tiffany Acevedo MRN: [id number] DOB:1965/07/17, 58 y.o., female Today's Date: 09/21/2023  END OF SESSION:  PT End of Session - 09/21/23 1432     Visit Number 4    Number of Visits 17    Date for PT Re-Evaluation 10/31/23    Authorization Type Eval: 09/05/2023    PT Start Time 1435    PT Stop Time 1515    PT Time Calculation (min) 40 min    Activity Tolerance Patient tolerated treatment well    Behavior During Therapy [facility] for tasks assessed/performed            Past Medical History:  Diagnosis Date   Allergy    Anxiety    Breast cancer (HCC)    Depression    Excessive weight gain 06/30/2016   Family history of colon cancer    Family history of colon cancer    Family history of prostate cancer    Frequent headaches    GERD (gastroesophageal reflux disease)    History of kidney stones    Hypertension    Migraines    Pre-diabetes    Past Surgical History:  Procedure Laterality Date   BREAST BIOPSY Right 07/13/2019   stereo biopsy/ x clip/path pending   BREAST BIOPSY Right 07/13/2019   stereo biopsy/coil clip/ path pending   BREAST RECONSTRUCTION WITH PLACEMENT OF TISSUE EXPANDER AND FLEX HD (ACELLULAR HYDRATED DERMIS) Bilateral 08/27/2019   Procedure: BILATERAL BREAST RECONSTRUCTION WITH PLACEMENT OF TISSUE EXPANDER AND FLEX HD (ACELLULAR HYDRATED DERMIS);  Surgeon: Lowery Estefana RAMAN, DO;  Location: [facility];  Service: Plastics;  Laterality: Bilateral;   CESAREAN SECTION  1994, 2004, 2013   COLONOSCOPY  2015   CYSTOSCOPY W/ RETROGRADES Right 09/13/2018   Procedure: CYSTOSCOPY WITH RETROGRADE PYELOGRAM;  Surgeon: Twylla Glendia BROCKS, MD;  Location: [facility];  Service: Urology;  Laterality: Right;   CYSTOSCOPY/URETEROSCOPY/HOLMIUM LASER/STENT PLACEMENT Left 05/09/2018   Procedure: CYSTOSCOPY/URETEROSCOPY/HOLMIUM LASER/STENT PLACEMENT;  Surgeon: Twylla Glendia BROCKS, MD;  Location: [facility];  Service: Urology;   Laterality: Left;   CYSTOSCOPY/URETEROSCOPY/HOLMIUM LASER/STENT PLACEMENT Right 09/13/2018   Procedure: CYSTOSCOPY/URETEROSCOPY/HOLMIUM LASER/STENT PLACEMENT;  Surgeon: Twylla Glendia BROCKS, MD;  Location: [facility];  Service: Urology;  Laterality: Right;   INCONTINENCE SURGERY     BLADDER TUCK   LIPOSUCTION WITH LIPOFILLING Bilateral 05/05/2020   Procedure: LIPOSUCTION WITH LIPOFILLING; LIPOSUCTION OF EXCESS LATERAL BREAST TISSUE;  Surgeon: Lowery Estefana RAMAN, DO;  Location: [facility];  Service: Plastics;  Laterality: Bilateral;  90 min   PILONIDAL CYST EXCISION     REMOVAL OF BILATERAL TISSUE EXPANDERS WITH PLACEMENT OF BILATERAL BREAST IMPLANTS Bilateral 01/02/2020   Procedure: REMOVAL OF BILATERAL TISSUE EXPANDERS WITH PLACEMENT OF BILATERAL BREAST IMPLANTS;  Surgeon: Lowery Estefana RAMAN, DO;  Location: [facility];  Service: Plastics;  Laterality: Bilateral;  2 hours, please   STONE EXTRACTION WITH BASKET Right 09/13/2018   Procedure: STONE EXTRACTION WITH BASKET;  Surgeon: Twylla Glendia BROCKS, MD;  Location: [facility];  Service: Urology;  Laterality: Right;   TOTAL MASTECTOMY Bilateral 08/27/2019   Procedure: TOTAL MASTECTOMY;  Surgeon: Rodolph Romano, MD;  Location: [facility];  Service: General;  Laterality: Bilateral;  START @930  DUE TO SENTINEL NODE   UPPER GI ENDOSCOPY     Patient Active Problem List   Diagnosis Date Noted   Postmenopausal bleeding 10/29/2020   Breast asymmetry following reconstructive surgery 03/25/2020   S/P breast reconstruction, bilateral 03/06/2020   Sebaceous cyst  12/31/2019   Closed fracture of proximal end of left humerus 12/03/2019   Broken humerus 12/02/2019   Breast wound 10/05/2019   Acquired absence of bilateral breasts and nipples 09/04/2019   Genetic testing 08/10/2019   Monoallelic mutation of CHEK2 gene in female patient 08/07/2019   Malignant neoplasm of upper-outer quadrant of right breast in female, estrogen receptor  positive (HCC) 07/30/2019   Goals of care, counseling/discussion 07/30/2019   Family history of prostate cancer    Family history of colon cancer    Breast cancer (HCC) 07/25/2019   Left lower quadrant pain 05/23/2018   Personal history of kidney stones 04/20/2018   Microscopic hematuria 04/20/2018   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 01/22/2017   Overweight (BMI 25.0-29.9) 01/22/2017   Encounter for preventive health examination 06/30/2016   Renal colic 04/12/2015   Ureteral calculus 04/12/2015   Nephrolithiasis 03/04/2015   Intractable migraine with aura without status migrainosus 10/29/2013   Lack of libido 06/24/2013   Migraine syndrome 03/11/2013   Generalized anxiety disorder 03/11/2013    PCP: Marikay Eva POUR, PA  REFERRING PROVIDER: Marchia Drivers, MD  REFERRING DIAG:  [phone] (ICD-10-CM) - Unilateral primary osteoarthritis, left knee   RATIONALE FOR EVALUATION AND TREATMENT: Rehabilitation  THERAPY DIAG: Stiffness of left knee, not elsewhere classified  Muscle weakness (generalized)  Chronic pain of left knee  ONSET DATE: May 2025   FOLLOW-UP APPT SCHEDULED WITH REFERRING PROVIDER: No   SUBJECTIVE:                                                                                                                                                                                         SUBJECTIVE STATEMENT:    Patient reporting to OPPT with a chief concern of L knee pain.   PERTINENT HISTORY:   Tiffany Acevedo is a 58 year old female presenting to OPPT with Chronic L knee pain. She reports walking around [facility] all day and the next morning she had moderate to severe knee pain. R knee without relief following the trip. She went to [facility] and received steroid injection in the L knee, some relief provided.  She reports that a couple weeks she has been riding the [employer] bike. She has enjoyed the low intensity from the exercise bike and it has  helped decreased pain. She has been prescribed meloxicam in order to help reduce inflammation and pain BID. Aggravating Factors: sitting too long, internal rotation of the tibia (twisting when she has to get off pelaton).  Relieving Factors include: exercise bike. Patient reports audible popping in the L knee with sharp pain, but with quick resolution.  Imaging (  Per Chart Review - Krasinski (07/19/23)):     Diagnostic Studies: X-rays from the previous visit show moderate arthritis in both knees, with narrowing of the medial compartment more pronounced in the left knee. Moderate degenerative changes are also in the patellofemoral compartments of both knees.   Red Flags: Patient denies numbness and tingling, saddle paraesthesia, chills, fevers and nausea.    PAIN:   Pain Intensity: Present: 0/10, Best: 0/10, Worst: 7/10 Pain location: Left Knee Pain quality: intermittent and sharp  Radiating pain: Yes  Swelling: Yes  Popping, catching, locking: No  Numbness/Tingling: No Focal weakness or buckling: No 24-hour pain behavior: PM  How long can you stand: 1 hr  History of prior back, hip, or knee injury, pain, surgery, or therapy: Yes  PRECAUTIONS: Fall  WEIGHT BEARING RESTRICTIONS: No  FALLS: Has patient fallen in last 6 months? No  LIVING ENVIRONMENT: Lives with: lives with their spouse, 1 dog Lives in: House/apartment Stairs: Yes: Internal: 10 steps; can reach both and External: 3 steps; can reach both Has following equipment at home: None  OCCUPATION: Teacher  PLOF: Independent  PATIENT GOALS: Patient would like to squat/transfer from the floor with baby   OBJECTIVE:   Patient Surveys  LEFS: 65 / 80 = 81.3 %   Cognition WNL     Gross Musculoskeletal Assessment Tremor: None Bulk: Normal Tone: Normal  GAIT: Distance walked: 47m  Assistive device utilized: None Level of assistance: Complete Independence Comments: Narrow BOS, Reciprocal   Posture: Standing:  WNL   AROM AROM (Normal range in degrees) AROM  Hip Right Left  Flexion (125) WNL WNL  Extension (15)    Abduction (40)    Adduction     Internal Rotation (45) WNL WNL  External Rotation (45) WNL WNl      Knee    Flexion (135) WNL WNL  Extension (0) WNL WNL       Ankle    Dorsiflexion (20) WNL WNL  Plantarflexion (50) WNL WNL  Inversion (35)    Eversion (15    (* = pain; Blank rows = not tested)  LE MMT: MMT (out of 5) Right Left  Hip flexion 4 4  Hip extension    Hip abduction 4- 4-  Hip adduction    Hip internal rotation    Hip external rotation    Knee flexion 4 4  Knee extension 4 4  Ankle dorsiflexion 5 5  Ankle plantarflexion 5 5  Ankle inversion    Ankle eversion    (* = pain; Blank rows = not tested)  Sensation Grossly intact to light touch bilateral LEs as determined by testing dermatomes L2-S2. Proprioception, and hot/cold testing deferred on this date.  Reflexes R/L Knee Jerk (L3/4): 2+/2+  Ankle Jerk (S1/2): 2+/2+   Muscle Length Hamstrings: R: Negative L: Negative  Palpation Location LEFT  RIGHT           Quadriceps 0   Medial Hamstrings 0   Lateral Hamstrings 0   Lateral Hamstring tendon    Medial Hamstring tendon 1    Quadriceps tendon 0   Patella    Patellar Tendon    Tibial Tuberosity    Medial joint line 1   Lateral joint line    MCL    LCL    Adductor Tubercle    Pes Anserine tendon    Infrapatellar fat pad    Fibular head    Popliteal fossa 0   (Blank rows = not tested) Graded  on 0-4 scale (0 = no pain, 1 = pain, 2 = pain with wincing/grimacing/flinching, 3 = pain with withdrawal, 4 = unwilling to allow palpation), (Blank rows = not tested)  SPECIAL TESTS  Patellofemoral Pain Syndrome Squatting pain: R: Negative L: Negative Stair climbing pain: R: Not examined L: Negative Kneeling pain: R: Negative L: Negative Resisted knee extension pain: L: Negative Compression: R: Not examined L: Negative  TODAY'S  TREATMENT: DATE: 09/21/23  SUBJECTIVE:  Patient without soreness in the L knee, able to perform 15 min of pelaton without knee pain. No questions or concerns. Pt agreeable to d/c in next appt.   Therapeutic Exercise:  Seated Hamstring Curl  2 x 10 - Green TB  Seated L LAQ with RLE on LLE;   2 x 15     Standing Calve Raise + stretch on 1st stair step    2 x 15s    Seated calve stretch with strap    L: 30s/bout x 1 in order to improve muscle tension and tissue extensibility     Therapeutic Activity:  Matrix Exercise Bike L5-2 x 5 min (Seat 9) for LE warm up, strength and endurance; PT manually adjusted resistance throughout to patient tolerance.   Kettle Bell Squat  1 x 10 - 10#  2 x 10 - 20#   Forward Step Down - BUE Support  R/L: 2 x 10     VC for patellar tracking   Lateral Stepping against resistance  4 x 10' - Grey TB around thighs  4 x 10' - Grey TB around thighs    TRX Reverse Lunge  1 x 8 ea leg - Good demonstration of knee stability    PATIENT EDUCATION:  Education details: HEP, exercise technique  Person educated: Patient Education method: Explanation, Demonstration, and Handouts Education comprehension: verbalized understanding and returned demonstration   HOME EXERCISE PROGRAM:  Access Code: [email or web address] URL: [email or web address] Date: 09/07/2023 Prepared by: Lonni Clotiel Troop  Exercises - Lateral Step Down  - 1 x daily - 2-3 x weekly - 2-3 sets - 5-10 reps - Clam with Resistance  - 1 x daily - 3-4 x weekly - 2-3 sets - 10-12 reps - Bridge with Resistance  - 1 x daily - 3-4 x weekly - 2-3 sets - 10-12 reps - Mini Squat  - 1 x daily - 7 x weekly - 2-3 sets - 10-12 reps - Side Stepping with Resistance at Thighs  - 1 x daily - 7 x weekly - 3 sets - 12 reps - Upright Bike  - 1 x daily - 3-4 x weekly - 1 sets - 10-66min hold - Supine Quadriceps Stretch with Strap on Table  - 2 x daily - 7 x weekly - 3 sets - 30 hold - Supine Gluteus Stretch  -  2 x daily - 7 x weekly - 2-3 sets - 30 hold  ASSESSMENT:  CLINICAL IMPRESSION: Continued PT POC focused on increasing gluteal strength in order to improve knee stability and patellar tracking. Patient tolerated all interventions without exacerbation of knee pain; however she endorsed clicking throughout knee flexion with squats. Good demonstration of eccentric control in quadriceps with descent from stair step but required BUE support in order to maintain balance. Pt agreeable to d/c in next appt due to signficant progress and upcoming school year starting. PT plans to create comprehensive home/gym program for pt in order to maintain progress.  OBJECTIVE IMPAIRMENTS: decreased activity tolerance, decreased balance, decreased strength, improper body mechanics, and pain.  ACTIVITY LIMITATIONS: bending, sitting, and squatting  PARTICIPATION LIMITATIONS: community activity and occupation  PERSONAL FACTORS: Age and Time since onset of injury/illness/exacerbation are also affecting patient's functional outcome.   REHAB POTENTIAL: Good  CLINICAL DECISION MAKING: Stable/uncomplicated  EVALUATION COMPLEXITY: Low   GOALS: Goals reviewed with patient? Yes  SHORT TERM GOALS: Target date: 10/19/2023  Pt will be independent with HEP to improve strength and decrease knee pain to improve pain-free function at home and work. Baseline: 09/05/2023: Initial HEP provided Goal status: INITIAL   LONG TERM GOALS: Target date: 11/16/2023  Patient will be able to perform floor to stand transfer without verbal cues and with a 10# kettlebell in order to demonstrate good transfer ability while carrying grand baby. Baseline:  Goal status: INITIAL  2.  Pt will decrease worst knee pain by at least 3 points on the NPRS in order to demonstrate clinically significant reduction in knee pain. Baseline: 09/05/2023: 7/10 NPS Goal status: INITIAL  3.  Pt will increase LEFS score by at least 9 points in order  demonstrate clinically significant reduction in knee pain/disability.       Baseline: 09/05/2023: 65 / 80 = 81.3 % Goal status: INITIAL  4.  Pt will increase strength of L knee ext/flex by at least 1/2 MMT grade in order to demonstrate improvement in strength and function  Baseline: 09/05/2023: L Knee Flex/Ext: 4/5,4/5 Goal status: INITIAL   PLAN: PT FREQUENCY: 1-2x/week  PT DURATION: 8 weeks  PLANNED INTERVENTIONS: Therapeutic exercises, Therapeutic activity, Neuromuscular re-education, Balance training, Gait training, Patient/Family education, Self Care, Joint mobilization, Joint manipulation, Vestibular training, Canalith repositioning, Orthotic/Fit training, DME instructions, Dry Needling, Electrical stimulation, Spinal manipulation, Spinal mobilization, Cryotherapy, Moist heat, Taping, Traction, Ultrasound, Ionotophoresis 4mg /ml Dexamethasone , Manual therapy, and Re-evaluation.  PLAN FOR NEXT SESSION: Progress    Lonni Pall PT, DPT Physical Therapist- [place]  09/21/2023, 3:16 PM

## 2023-09-26 ENCOUNTER — Ambulatory Visit

## 2023-09-26 ENCOUNTER — Telehealth: Payer: Self-pay

## 2023-09-26 NOTE — Telephone Encounter (Signed)
 Called patient regarding missed appt. For formal discharge from PT. Pt advised that she forgot due to busy schedule and doesn't need to attend PT for formal discharge. She will speak to referring provider if exacerbation or worsening symptoms occur. Pt reported that blood pressure seemed abnormal and has ceased meloxicam in order to maintain proper blood pressure. She will inform physician regarding self decision. No questions at end of phone call.   Lonni Pall PT, DPT Physical Therapist- [place]  .

## 2023-09-28 ENCOUNTER — Ambulatory Visit

## 2023-10-25 ENCOUNTER — Encounter: Payer: Self-pay | Admitting: Oncology

## 2023-10-25 ENCOUNTER — Inpatient Hospital Stay: Payer: BC Managed Care – PPO | Attending: Oncology | Admitting: Oncology

## 2023-10-25 VITALS — BP 136/74 | HR 89 | Temp 98.6°F | Resp 18 | Wt 171.0 lb

## 2023-10-25 DIAGNOSIS — Z08 Encounter for follow-up examination after completed treatment for malignant neoplasm: Secondary | ICD-10-CM | POA: Diagnosis not present

## 2023-10-25 DIAGNOSIS — Z800 Family history of malignant neoplasm of digestive organs: Secondary | ICD-10-CM | POA: Diagnosis not present

## 2023-10-25 DIAGNOSIS — Z853 Personal history of malignant neoplasm of breast: Secondary | ICD-10-CM | POA: Diagnosis present

## 2023-10-25 DIAGNOSIS — Z809 Family history of malignant neoplasm, unspecified: Secondary | ICD-10-CM | POA: Insufficient documentation

## 2023-10-25 DIAGNOSIS — Z8042 Family history of malignant neoplasm of prostate: Secondary | ICD-10-CM | POA: Diagnosis not present

## 2023-10-25 DIAGNOSIS — Z9013 Acquired absence of bilateral breasts and nipples: Secondary | ICD-10-CM | POA: Diagnosis not present

## 2023-10-25 DIAGNOSIS — Z806 Family history of leukemia: Secondary | ICD-10-CM | POA: Diagnosis not present

## 2023-10-30 NOTE — Progress Notes (Signed)
 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  This was followed by diagnostic mammogram which showed numerous groups of calcifications spanning at least 6.4 cm.  She had a biopsy of the medialmost and lateralmost calcification.  The medialmost calcification showed invasive mammary carcinoma grade 1 ER/PR positive and HER-2 negative.  The lateralmost calcification was negative for atypia and malignancy and showed fibrocystic changes.  Patient has met with Dr. Cesar and is leaning towards at least a unilateral mastectomy.  She also underwent bilateral MRI which did not show any enhancement even at the site of biopsy-proven malignancy or elsewhere.  No MRI evidence of malignancy in the left breast.  Patient has not had any menstrual cycle since May 2021 right before her surgery.   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  3 sentinel lymph nodes  negative for malignancy.  No evidence of malignancy or DCIS in the left breast.  Tamoxifen  started in late December 2021.  Subsequently patient stopped taking it due to intolerance  Interval history- Patient feels great overall.  She did undergo physical therapy for left knee pain recently.  ECOG PS- 0 Pain scale- 0  Review of systems- Review of Systems  Constitutional:  Negative for chills, fever, malaise/fatigue and weight loss.  HENT:  Negative for congestion, ear discharge and nosebleeds.   Eyes:  Negative for blurred vision.  Respiratory:  Negative for cough, hemoptysis, sputum production, shortness of breath and wheezing.   Cardiovascular:  Negative for chest pain, palpitations, orthopnea and claudication.  Gastrointestinal:  Negative for abdominal pain, blood in stool, constipation, diarrhea, heartburn, melena, nausea and vomiting.  Genitourinary:  Negative for dysuria, flank pain, frequency, hematuria and urgency.  Musculoskeletal:  Negative for back pain, joint pain and myalgias.  Skin:  Negative for rash.  Neurological:  Negative for dizziness, tingling, focal weakness, seizures, weakness and headaches.  Endo/Heme/Allergies:  Does not bruise/bleed easily.  Psychiatric/Behavioral:  Negative for depression and suicidal ideas. The patient does not have insomnia.       Allergies  Allergen Reactions   Oxycodone-Acetaminophen     Percocet [Oxycodone-Acetaminophen ] Other (See Comments)    Arms and legs go numb   Sulfa Antibiotics Nausea And Vomiting     Past Medical History:  Diagnosis Date   Allergy    Anxiety    Breast cancer (HCC)    Depression    Excessive weight gain 06/30/2016   Family history of colon cancer    Family history of colon cancer    Family history of prostate cancer    Frequent headaches  GERD (gastroesophageal reflux disease)    History of kidney stones    Hypertension    Migraines    Pre-diabetes      Past Surgical History:  Procedure  Laterality Date   BREAST BIOPSY Right 07/13/2019   stereo biopsy/ x clip/path pending   BREAST BIOPSY Right 07/13/2019   stereo biopsy/coil clip/ path pending   BREAST RECONSTRUCTION WITH PLACEMENT OF TISSUE EXPANDER AND FLEX HD (ACELLULAR HYDRATED DERMIS) Bilateral 08/27/2019   Procedure: BILATERAL BREAST RECONSTRUCTION WITH PLACEMENT OF TISSUE EXPANDER AND FLEX HD (ACELLULAR HYDRATED DERMIS);  Surgeon: Lowery Estefana RAMAN, DO;  Location: [facility];  Service: Plastics;  Laterality: Bilateral;   CESAREAN SECTION  1994, 2004, 2013   COLONOSCOPY  2015   CYSTOSCOPY W/ RETROGRADES Right 09/13/2018   Procedure: CYSTOSCOPY WITH RETROGRADE PYELOGRAM;  Surgeon: Twylla Glendia BROCKS, MD;  Location: [facility];  Service: Urology;  Laterality: Right;   CYSTOSCOPY/URETEROSCOPY/HOLMIUM LASER/STENT PLACEMENT Left 05/09/2018   Procedure: CYSTOSCOPY/URETEROSCOPY/HOLMIUM LASER/STENT PLACEMENT;  Surgeon: Twylla Glendia BROCKS, MD;  Location: [facility];  Service: Urology;  Laterality: Left;   CYSTOSCOPY/URETEROSCOPY/HOLMIUM LASER/STENT PLACEMENT Right 09/13/2018   Procedure: CYSTOSCOPY/URETEROSCOPY/HOLMIUM LASER/STENT PLACEMENT;  Surgeon: Twylla Glendia BROCKS, MD;  Location: [facility];  Service: Urology;  Laterality: Right;   INCONTINENCE SURGERY     BLADDER TUCK   LIPOSUCTION WITH LIPOFILLING Bilateral 05/05/2020   Procedure: LIPOSUCTION WITH LIPOFILLING; LIPOSUCTION OF EXCESS LATERAL BREAST TISSUE;  Surgeon: Lowery Estefana RAMAN, DO;  Location: [facility];  Service: Plastics;  Laterality: Bilateral;  90 min   PILONIDAL CYST EXCISION     REMOVAL OF BILATERAL TISSUE EXPANDERS WITH PLACEMENT OF BILATERAL BREAST IMPLANTS Bilateral 01/02/2020   Procedure: REMOVAL OF BILATERAL TISSUE EXPANDERS WITH PLACEMENT OF BILATERAL BREAST IMPLANTS;  Surgeon: Lowery Estefana RAMAN, DO;  Location: [facility];  Service: Plastics;  Laterality: Bilateral;  2 hours, please   STONE EXTRACTION WITH BASKET Right 09/13/2018    Procedure: STONE EXTRACTION WITH BASKET;  Surgeon: Twylla Glendia BROCKS, MD;  Location: [facility];  Service: Urology;  Laterality: Right;   TOTAL MASTECTOMY Bilateral 08/27/2019   Procedure: TOTAL MASTECTOMY;  Surgeon: Rodolph Romano, MD;  Location: [facility];  Service: General;  Laterality: Bilateral;  START @930  DUE TO SENTINEL NODE   UPPER GI ENDOSCOPY      Social History   Socioeconomic History   Marital status: Married    Spouse name: Not on file   Number of children: Not on file   Years of education: Not on file   Highest education level: Not on file  Occupational History   Not on file  Tobacco Use   Smoking status: Never   Smokeless tobacco: Never  Vaping Use   Vaping status: Never Used  Substance and Sexual Activity   Alcohol use: Yes    Comment: glass of wine occassionally   Drug use: No   Sexual activity: Yes    Partners: Male  Other Topics Concern   Not on file  Social History Narrative   Not on file   Social Drivers of Health   Financial Resource Strain: Low Risk  (07/13/2023)   Received from [facility]   Overall Financial Resource Strain (CARDIA)    Difficulty of Paying Living Expenses: Not hard at all  Food Insecurity: No Food Insecurity (07/13/2023)   Received from [facility]   Hunger Vital Sign    Within the past 12 months, you worried that your food would run out before you  got the money to buy more.: Never true    Within the past 12 months, the food you bought just didn't last and you didn't have money to get more.: Never true  Transportation Needs: No Transportation Needs (07/13/2023)   Received from [facility] - Transportation    In the past 12 months, has lack of transportation kept you from medical appointments or from getting medications?: No    Lack of Transportation (Non-Medical): No  Physical Activity: Not on file  Stress: Not on file  Social Connections: Not on file  Intimate  Partner Violence: Not on file    Family History  Problem Relation Age of Onset   Hyperlipidemia Mother    Hypertension Mother    Depression Mother    Colon cancer Mother 37   Alcohol abuse Father    Hyperlipidemia Father    Stroke Father    Hypertension Father    Depression Father    Heart disease Father    Prostate cancer Father 33   Arthritis Maternal Grandmother    Hypertension Maternal Grandmother    Depression Maternal Grandmother    Arthritis Maternal Grandfather    Stroke Maternal Grandfather    Hypertension Maternal Grandfather    Depression Maternal Grandfather    Hypertension Paternal Grandmother    Depression Paternal Grandmother    Hypertension Paternal Grandfather    Depression Paternal Grandfather    Leukemia Maternal Aunt        d <50   Cancer Paternal Aunt        unsure types     Current Outpatient Medications:    Ascorbic Acid (VITAMIN C) 100 MG tablet, Take 500 mg by mouth daily., Disp: , Rfl:    Calcium Carbonate-Vit D-Min (CALCIUM 1200 PO), Take 1 tablet by mouth daily. , Disp: , Rfl:    Fremanezumab-vfrm (AJOVY) 225 MG/1.5ML SOAJ, Inject into the skin., Disp: , Rfl:    hydrochlorothiazide  (MICROZIDE ) 12.5 MG capsule, Take 1 capsule (12.5 mg total) by mouth daily., Disp: 30 capsule, Rfl: 2   HYDROcodone -acetaminophen  (NORCO/VICODIN) 5-325 MG tablet, Take 1 tablet by mouth every 6 (six) hours as needed for moderate pain (pain score 4-6)., Disp: 15 tablet, Rfl: 0   ibuprofen  (ADVIL ,MOTRIN ) 200 MG tablet, Take 400 mg by mouth every 6 (six) hours as needed for headache or moderate pain. , Disp: , Rfl:    imipramine  (TOFRANIL ) 50 MG tablet, Take 50 mg by mouth at bedtime. , Disp: , Rfl:    meloxicam (MOBIC) 7.5 MG tablet, Take 7.5 mg by mouth., Disp: , Rfl:    metoprolol  succinate (TOPROL -XL) 100 MG 24 hr tablet, Take 25 mg by mouth daily., Disp: , Rfl:    Multiple Vitamin (MULTIVITAMIN PO), Take 1 tablet by mouth daily., Disp: , Rfl:    omeprazole  (PRILOSEC) 20 MG capsule, Take 20 mg by mouth daily. , Disp: , Rfl:    rizatriptan (MAXALT) 10 MG tablet, Take 10 mg by mouth as needed for migraine., Disp: , Rfl:    tamsulosin  (FLOMAX ) 0.4 MG CAPS capsule, Take 1 capsule (0.4 mg total) by mouth daily after breakfast., Disp: 14 capsule, Rfl: 0   venlafaxine  XR (EFFEXOR -XR) 150 MG 24 hr capsule, TAKE 1 CAPSULE BY MOUTH EVERY DAY WITH BREAKFAST, Disp: 30 capsule, Rfl: 5   Zinc 22.5 MG TABS, Take 1 tablet by mouth daily., Disp: , Rfl:    busPIRone (BUSPAR) 5 MG tablet, Take 5 mg by mouth 2 (two) times  daily. (Patient not taking: Reported on 10/25/2023), Disp: , Rfl:   Physical exam:  Vitals:   10/25/23 1510  BP: 136/74  Pulse: 89  Resp: 18  Temp: 98.6 F (37 C)  SpO2: 98%  Weight: 171 lb (77.6 kg)   Physical Exam Cardiovascular:     Rate and Rhythm: Normal rate and regular rhythm.     Heart sounds: Normal heart sounds.  Pulmonary:     Effort: Pulmonary effort is normal.     Breath sounds: Normal breath sounds.  Skin:    General: Skin is warm and dry.  Neurological:     Mental Status: She is alert and oriented to person, place, and time.   Chest wall exam: Patient is s/p bilateral mastectomy with reconstruction.  No evidence of chest wall recurrence.  No palpable bilateral axillary adenopathy.  I have personally reviewed labs listed below:    Latest Ref Rng & Units 05/05/2020   11:15 AM  CMP  Glucose 70 - 99 mg/dL 890   BUN 6 - 20 mg/dL 14   Creatinine 9.55 - 1.00 mg/dL 9.13   Sodium 864 - 854 mmol/L 141   Potassium 3.5 - 5.1 mmol/L 3.8   Chloride 98 - 111 mmol/L 106   CO2 22 - 32 mmol/L 27   Calcium 8.9 - 10.3 mg/dL 9.3       Latest Ref Rng & Units 10/25/2022    4:06 PM  CBC  WBC 4.0 - 10.5 K/uL 10.6   Hemoglobin 12.0 - 15.0 g/dL 86.8   Hematocrit 63.9 - 46.0 % 40.0   Platelets 150 - 400 K/uL 220      Assessment and plan- Patient is a 58 y.o. female with history of stage Ia invasive mammary carcinoma of the right  breast ER/PR positive HER2 negative s/p bilateral mastectomy.  She did not require adjuvant radiation therapy and could not tolerate tamoxifen  subsequently.  This is a surveillance visit for breast cancer  Clinically patient is doing well with no concerning signs and symptoms of recurrence based on today's exam.  Given that she has had a bilateral mastectomy she would not require any routine surveillance mammograms.  She wishes to follow-up with me on a yearly basis and I will see her back in 1 year no labs    Visit Diagnosis 1. Encounter for follow-up surveillance of breast cancer      Dr. Annah Skene, MD, MPH [facility] [phone] 10/30/2023 8:41 PM

## 2023-11-24 ENCOUNTER — Other Ambulatory Visit: Payer: Self-pay

## 2023-11-24 ENCOUNTER — Ambulatory Visit: Admitting: Physician Assistant

## 2023-11-24 ENCOUNTER — Ambulatory Visit
Admission: RE | Admit: 2023-11-24 | Discharge: 2023-11-24 | Disposition: A | Discharge status: Home / Self Care | Source: Ambulatory Visit | Attending: Physician Assistant | Admitting: Physician Assistant

## 2023-11-24 ENCOUNTER — Telehealth: Payer: Self-pay

## 2023-11-24 VITALS — BP 135/81 | HR 80 | Ht 64.0 in | Wt 170.0 lb

## 2023-11-24 DIAGNOSIS — N201 Calculus of ureter: Secondary | ICD-10-CM

## 2023-11-24 DIAGNOSIS — N200 Calculus of kidney: Secondary | ICD-10-CM

## 2023-11-24 LAB — URINALYSIS, COMPLETE
Bilirubin, UA: NEGATIVE
Glucose, UA: NEGATIVE
Ketones, UA: NEGATIVE
Leukocytes,UA: NEGATIVE
Nitrite, UA: NEGATIVE
Protein,UA: NEGATIVE
RBC, UA: NEGATIVE
Specific Gravity, UA: 1.02 (ref 1.005–1.030)
Urobilinogen, Ur: 0.2 mg/dL (ref 0.2–1.0)
pH, UA: 6 (ref 5.0–7.5)

## 2023-11-24 LAB — MICROSCOPIC EXAMINATION: Epithelial Cells (non renal): >10 /HPF — AB (ref 0–10)

## 2023-11-24 MED ORDER — ONDANSETRON 4 MG PO TBDP: 4.0000 mg | ORAL_TABLET | Freq: Three times a day (TID) | ORAL | 0 refills | Status: DC | PRN

## 2023-11-24 NOTE — Patient Instructions (Signed)
 Melissa will call you to schedule your ureteroscopy with Dr. Francisca. In the meantime, please do the following: -Take Flomax  0.4mg  daily -Treat any pain with Advil  (ibuprofen ), Tylenol  (acetaminophen ), or Norco -Treat any nausea with Zofran   Please call our office immediately (we are open 8a-5p Monday-Thursday, 8a-12p Friday) or go to the Emergency Department if you develop any of the following: -Fever/chills -Nausea and/or vomiting uncontrollable with Zofran  -Pain uncontrollable with Norco

## 2023-11-24 NOTE — Telephone Encounter (Signed)
 Per Dr. Georganne, Patient is to be scheduled for Left Ureteroscopy with Laser Lithotripsy and Stent Placement   Tiffany Acevedo was contacted and possible surgical dates were discussed, Tuesday October 28th, 2025 was agreed upon for surgery.   Patient was directed to call [phone] between 1-3pm the day before surgery to find out surgical arrival time.  Instructions were given not to eat or drink from midnight on the night before surgery and have a driver for the day of surgery. On the surgery day patient was instructed to enter through the [facility] entrance of [facility] report the Same Day Surgery desk.   Pre-Admit Testing will be in contact via phone to set up an interview with the anesthesia team to review your history and medications prior to surgery.   Reminder of this information was sent via MyChart to the patient.

## 2023-11-24 NOTE — Progress Notes (Signed)
 Surgical Physician Order Form [facility] [place]  Dr. Georganne * Scheduling expectation : 10/28  *Length of Case:   *Clearance needed: no  *Anticoagulation Instructions: N/A  *Aspirin Instructions: N/A  *Post-op visit Date/Instructions:  1 week cysto stent removal  *Diagnosis: Left Ureteral Stone  *Procedure: left Ureteroscopy w/laser lithotripsy & stent placement (47643)   Additional orders: N/A  -Admit type: OUTpatient  -Anesthesia: General  -VTE Prophylaxis Standing Order SCD's       Other:   -Standing Lab Orders Per Anesthesia    Lab other: None  -Standing Test orders EKG/Chest x-ray per Anesthesia       Test other:   - Medications:  Ancef  2gm IV  -Other orders:  N/A

## 2023-11-24 NOTE — H&P (View-Only) (Signed)
 11/24/2023 11:11 AM   Tiffany Acevedo 1965-12-22 [id number]  CC: Chief Complaint  Patient presents with   Follow-up   Nephrolithiasis   HPI: Tiffany Acevedo is a 58 y.o. female with PMH nephrolithiasis and urge incontinence who presents today for evaluation of an acute stone episode.   She was seen in clinic by Dr. Twylla most recently on 08/19/2023 in the setting of several proximal/mid left ureteral stones, for which she elected trial of passage.  Follow-up KUB on 08/31/2023 showed interval migration of the stones to the distal left ureter, however her pain resolved.  Today she reports 2 episodes of left flank pain in the past 2 weeks, most recently this morning, with increased urge incontinence over baseline. Pain responded to Norco and she is comfortable today in clinic, though does report some bladder pressure.  She denies fever, chills, nausea, or vomiting.  KUB today with stable distal left ureteral stones.  In-office UA pan negative; urine microscopy with >10 epithelial cells/hpf.  PMH: Past Medical History:  Diagnosis Date   Allergy    Anxiety    Breast cancer (HCC)    Depression    Excessive weight gain 06/30/2016   Family history of colon cancer    Family history of colon cancer    Family history of prostate cancer    Frequent headaches    GERD (gastroesophageal reflux disease)    History of kidney stones    Hypertension    Migraines    Pre-diabetes     Surgical History: Past Surgical History:  Procedure Laterality Date   BREAST BIOPSY Right 07/13/2019   stereo biopsy/ x clip/path pending   BREAST BIOPSY Right 07/13/2019   stereo biopsy/coil clip/ path pending   BREAST RECONSTRUCTION WITH PLACEMENT OF TISSUE EXPANDER AND FLEX HD (ACELLULAR HYDRATED DERMIS) Bilateral 08/27/2019   Procedure: BILATERAL BREAST RECONSTRUCTION WITH PLACEMENT OF TISSUE EXPANDER AND FLEX HD (ACELLULAR HYDRATED DERMIS);  Surgeon: Lowery Estefana RAMAN, DO;   Location: [facility];  Service: Plastics;  Laterality: Bilateral;   CESAREAN SECTION  1994, 2004, 2013   COLONOSCOPY  2015   CYSTOSCOPY W/ RETROGRADES Right 09/13/2018   Procedure: CYSTOSCOPY WITH RETROGRADE PYELOGRAM;  Surgeon: Twylla Glendia BROCKS, MD;  Location: [facility];  Service: Urology;  Laterality: Right;   CYSTOSCOPY/URETEROSCOPY/HOLMIUM LASER/STENT PLACEMENT Left 05/09/2018   Procedure: CYSTOSCOPY/URETEROSCOPY/HOLMIUM LASER/STENT PLACEMENT;  Surgeon: Twylla Glendia BROCKS, MD;  Location: [facility];  Service: Urology;  Laterality: Left;   CYSTOSCOPY/URETEROSCOPY/HOLMIUM LASER/STENT PLACEMENT Right 09/13/2018   Procedure: CYSTOSCOPY/URETEROSCOPY/HOLMIUM LASER/STENT PLACEMENT;  Surgeon: Twylla Glendia BROCKS, MD;  Location: [facility];  Service: Urology;  Laterality: Right;   INCONTINENCE SURGERY     BLADDER TUCK   LIPOSUCTION WITH LIPOFILLING Bilateral 05/05/2020   Procedure: LIPOSUCTION WITH LIPOFILLING; LIPOSUCTION OF EXCESS LATERAL BREAST TISSUE;  Surgeon: Lowery Estefana RAMAN, DO;  Location: [facility];  Service: Plastics;  Laterality: Bilateral;  90 min   PILONIDAL CYST EXCISION     REMOVAL OF BILATERAL TISSUE EXPANDERS WITH PLACEMENT OF BILATERAL BREAST IMPLANTS Bilateral 01/02/2020   Procedure: REMOVAL OF BILATERAL TISSUE EXPANDERS WITH PLACEMENT OF BILATERAL BREAST IMPLANTS;  Surgeon: Lowery Estefana RAMAN, DO;  Location: [facility];  Service: Plastics;  Laterality: Bilateral;  2 hours, please   STONE EXTRACTION WITH BASKET Right 09/13/2018   Procedure: STONE EXTRACTION WITH BASKET;  Surgeon: Twylla Glendia BROCKS, MD;  Location: [facility];  Service: Urology;  Laterality: Right;   TOTAL MASTECTOMY Bilateral 08/27/2019   Procedure: TOTAL MASTECTOMY;  Surgeon: Rodolph Romano, MD;  Location: [facility];  Service: General;  Laterality: Bilateral;  START @930  DUE TO SENTINEL NODE   UPPER GI ENDOSCOPY      Home Medications:  Allergies as of 11/24/2023       Reactions    Oxycodone-acetaminophen     Percocet [oxycodone-acetaminophen ] Other (See Comments)   Arms and legs go numb   Sulfa Antibiotics Nausea And Vomiting        Medication List        Accurate as of November 24, 2023 11:11 AM. If you have any questions, ask your nurse or doctor.          Ajovy 225 MG/1.5ML Soaj Generic drug: Fremanezumab-vfrm Inject into the skin.   busPIRone 5 MG tablet Commonly known as: BUSPAR Take 5 mg by mouth 2 (two) times daily.   CALCIUM 1200 PO Take 1 tablet by mouth daily.   hydrochlorothiazide  12.5 MG capsule Commonly known as: Microzide  Take 1 capsule (12.5 mg total) by mouth daily.   HYDROcodone -acetaminophen  5-325 MG tablet Commonly known as: NORCO/VICODIN Take 1 tablet by mouth every 6 (six) hours as needed for moderate pain (pain score 4-6).   ibuprofen  200 MG tablet Commonly known as: ADVIL  Take 400 mg by mouth every 6 (six) hours as needed for headache or moderate pain.   imipramine  50 MG tablet Commonly known as: TOFRANIL  Take 50 mg by mouth at bedtime.   meloxicam 7.5 MG tablet Commonly known as: MOBIC Take 7.5 mg by mouth.   metoprolol  succinate 100 MG 24 hr tablet Commonly known as: TOPROL -XL Take 25 mg by mouth daily.   MULTIVITAMIN PO Take 1 tablet by mouth daily.   omeprazole 20 MG capsule Commonly known as: PRILOSEC Take 20 mg by mouth daily.   rizatriptan 10 MG tablet Commonly known as: MAXALT Take 10 mg by mouth as needed for migraine.   tamsulosin  0.4 MG Caps capsule Commonly known as: FLOMAX  Take 1 capsule (0.4 mg total) by mouth daily after breakfast.   venlafaxine  XR 150 MG 24 hr capsule Commonly known as: EFFEXOR -XR TAKE 1 CAPSULE BY MOUTH EVERY DAY WITH BREAKFAST   vitamin C 100 MG tablet Take 500 mg by mouth daily.   Zinc 22.5 MG Tabs Take 1 tablet by mouth daily.        Allergies:  Allergies  Allergen Reactions   Oxycodone-Acetaminophen     Percocet [Oxycodone-Acetaminophen ] Other (See  Comments)    Arms and legs go numb   Sulfa Antibiotics Nausea And Vomiting    Family History: Family History  Problem Relation Age of Onset   Hyperlipidemia Mother    Hypertension Mother    Depression Mother    Colon cancer Mother 55   Alcohol abuse Father    Hyperlipidemia Father    Stroke Father    Hypertension Father    Depression Father    Heart disease Father    Prostate cancer Father 61   Arthritis Maternal Grandmother    Hypertension Maternal Grandmother    Depression Maternal Grandmother    Arthritis Maternal Grandfather    Stroke Maternal Grandfather    Hypertension Maternal Grandfather    Depression Maternal Grandfather    Hypertension Paternal Grandmother    Depression Paternal Grandmother    Hypertension Paternal Grandfather    Depression Paternal Grandfather    Leukemia Maternal Aunt        d <50   Cancer Paternal Aunt        unsure types  Social History:   reports that she has never smoked. She has never used smokeless tobacco. She reports current alcohol use. She reports that she does not use drugs.  Physical Exam: BP 135/81 (BP Location: Left Arm, Patient Position: Sitting, Cuff Size: Normal)   Pulse 80   Ht 5' 4 (1.626 m)   Wt 170 lb (77.1 kg)   SpO2 93%   BMI 29.18 kg/m   Constitutional:  Alert and oriented, no acute distress, nontoxic appearing HEENT: [place], AT Cardiovascular: No clubbing, cyanosis, or edema Respiratory: Normal respiratory effort, no increased work of breathing Skin: No rashes, bruises or suspicious lesions Neurologic: Grossly intact, no focal deficits, moving all 4 extremities Psychiatric: Normal mood and affect  Laboratory Data: Results for orders placed or performed in visit on 08/19/23  Microscopic Examination   Collection Time: 08/19/23 12:54 PM   Urine  Result Value Ref Range   WBC, UA 6-10 (A) 0 - 5 /hpf   RBC, Urine 11-30 (A) 0 - 2 /hpf   Epithelial Cells (non renal) >10 (A) 0 - 10 /hpf   Bacteria, UA Moderate  (A) None seen/Few  Urinalysis, Complete   Collection Time: 08/19/23 12:54 PM  Result Value Ref Range   Specific Gravity, UA 1.020 1.005 - 1.030   pH, UA 6.0 5.0 - 7.5   Color, UA Yellow Yellow   Appearance Ur Clear Clear   Leukocytes,UA Trace (A) Negative   Protein,UA Negative Negative/Trace   Glucose, UA Negative Negative   Ketones, UA Negative Negative   RBC, UA 1+ (A) Negative   Bilirubin, UA Negative Negative   Urobilinogen, Ur 0.2 0.2 - 1.0 mg/dL   Nitrite, UA Negative Negative   Microscopic Examination See below:    Pertinent Imaging: KUB, 11/24/2023: See Epic  I personally reviewed the images referenced above and note stable distal left ureteral stones.  Assessment & Plan:   1. Left ureteral stone (Primary) Stable distal left ureteral stones on KUB today, UA bland with no signs of infection.  Given duration of the stone episode and her multiplicity of ureteral stones as well as numerous left renal stones, I recommended definitive management with left ureteroscopy with laser lithotripsy and stent placement and she agreed.  We discussed that she will need a ureteral stent postoperatively and this can cause discomfort and gross hematuria.  Will send UA for preop culture today.  She has pain meds and Flomax  on hand.  I am prescribing Zofran  for symptom control. - Urinalysis, Complete - CULTURE, URINE COMPREHENSIVE - ondansetron  (ZOFRAN -ODT) 4 MG disintegrating tablet; Take 1 tablet (4 mg total) by mouth every 8 (eight) hours as needed for nausea or vomiting.  Dispense: 20 tablet; Refill: 0 - Ambulatory Referral For Surgery Scheduling   Return for Will call to schedule surgery.  Lucie Hones, PA-C  [facility] [place] [address], [address] [place], [place] [address] [phone]

## 2023-11-24 NOTE — Progress Notes (Signed)
 11/24/2023 11:11 AM   Tiffany Acevedo 1965-12-22 [id number]  CC: Chief Complaint  Patient presents with   Follow-up   Nephrolithiasis   HPI: Tiffany Acevedo is a 58 y.o. female with PMH nephrolithiasis and urge incontinence who presents today for evaluation of an acute stone episode.   She was seen in clinic by Dr. Twylla most recently on 08/19/2023 in the setting of several proximal/mid left ureteral stones, for which she elected trial of passage.  Follow-up KUB on 08/31/2023 showed interval migration of the stones to the distal left ureter, however her pain resolved.  Today she reports 2 episodes of left flank pain in the past 2 weeks, most recently this morning, with increased urge incontinence over baseline. Pain responded to Norco and she is comfortable today in clinic, though does report some bladder pressure.  She denies fever, chills, nausea, or vomiting.  KUB today with stable distal left ureteral stones.  In-office UA pan negative; urine microscopy with >10 epithelial cells/hpf.  PMH: Past Medical History:  Diagnosis Date   Allergy    Anxiety    Breast cancer (HCC)    Depression    Excessive weight gain 06/30/2016   Family history of colon cancer    Family history of colon cancer    Family history of prostate cancer    Frequent headaches    GERD (gastroesophageal reflux disease)    History of kidney stones    Hypertension    Migraines    Pre-diabetes     Surgical History: Past Surgical History:  Procedure Laterality Date   BREAST BIOPSY Right 07/13/2019   stereo biopsy/ x clip/path pending   BREAST BIOPSY Right 07/13/2019   stereo biopsy/coil clip/ path pending   BREAST RECONSTRUCTION WITH PLACEMENT OF TISSUE EXPANDER AND FLEX HD (ACELLULAR HYDRATED DERMIS) Bilateral 08/27/2019   Procedure: BILATERAL BREAST RECONSTRUCTION WITH PLACEMENT OF TISSUE EXPANDER AND FLEX HD (ACELLULAR HYDRATED DERMIS);  Surgeon: Lowery Estefana RAMAN, DO;   Location: [facility];  Service: Plastics;  Laterality: Bilateral;   CESAREAN SECTION  1994, 2004, 2013   COLONOSCOPY  2015   CYSTOSCOPY W/ RETROGRADES Right 09/13/2018   Procedure: CYSTOSCOPY WITH RETROGRADE PYELOGRAM;  Surgeon: Twylla Glendia BROCKS, MD;  Location: [facility];  Service: Urology;  Laterality: Right;   CYSTOSCOPY/URETEROSCOPY/HOLMIUM LASER/STENT PLACEMENT Left 05/09/2018   Procedure: CYSTOSCOPY/URETEROSCOPY/HOLMIUM LASER/STENT PLACEMENT;  Surgeon: Twylla Glendia BROCKS, MD;  Location: [facility];  Service: Urology;  Laterality: Left;   CYSTOSCOPY/URETEROSCOPY/HOLMIUM LASER/STENT PLACEMENT Right 09/13/2018   Procedure: CYSTOSCOPY/URETEROSCOPY/HOLMIUM LASER/STENT PLACEMENT;  Surgeon: Twylla Glendia BROCKS, MD;  Location: [facility];  Service: Urology;  Laterality: Right;   INCONTINENCE SURGERY     BLADDER TUCK   LIPOSUCTION WITH LIPOFILLING Bilateral 05/05/2020   Procedure: LIPOSUCTION WITH LIPOFILLING; LIPOSUCTION OF EXCESS LATERAL BREAST TISSUE;  Surgeon: Lowery Estefana RAMAN, DO;  Location: [facility];  Service: Plastics;  Laterality: Bilateral;  90 min   PILONIDAL CYST EXCISION     REMOVAL OF BILATERAL TISSUE EXPANDERS WITH PLACEMENT OF BILATERAL BREAST IMPLANTS Bilateral 01/02/2020   Procedure: REMOVAL OF BILATERAL TISSUE EXPANDERS WITH PLACEMENT OF BILATERAL BREAST IMPLANTS;  Surgeon: Lowery Estefana RAMAN, DO;  Location: [facility];  Service: Plastics;  Laterality: Bilateral;  2 hours, please   STONE EXTRACTION WITH BASKET Right 09/13/2018   Procedure: STONE EXTRACTION WITH BASKET;  Surgeon: Twylla Glendia BROCKS, MD;  Location: [facility];  Service: Urology;  Laterality: Right;   TOTAL MASTECTOMY Bilateral 08/27/2019   Procedure: TOTAL MASTECTOMY;  Surgeon: Rodolph Romano, MD;  Location: [facility];  Service: General;  Laterality: Bilateral;  START @930  DUE TO SENTINEL NODE   UPPER GI ENDOSCOPY      Home Medications:  Allergies as of 11/24/2023       Reactions    Oxycodone-acetaminophen     Percocet [oxycodone-acetaminophen ] Other (See Comments)   Arms and legs go numb   Sulfa Antibiotics Nausea And Vomiting        Medication List        Accurate as of November 24, 2023 11:11 AM. If you have any questions, ask your nurse or doctor.          Ajovy 225 MG/1.5ML Soaj Generic drug: Fremanezumab-vfrm Inject into the skin.   busPIRone 5 MG tablet Commonly known as: BUSPAR Take 5 mg by mouth 2 (two) times daily.   CALCIUM 1200 PO Take 1 tablet by mouth daily.   hydrochlorothiazide  12.5 MG capsule Commonly known as: Microzide  Take 1 capsule (12.5 mg total) by mouth daily.   HYDROcodone -acetaminophen  5-325 MG tablet Commonly known as: NORCO/VICODIN Take 1 tablet by mouth every 6 (six) hours as needed for moderate pain (pain score 4-6).   ibuprofen  200 MG tablet Commonly known as: ADVIL  Take 400 mg by mouth every 6 (six) hours as needed for headache or moderate pain.   imipramine  50 MG tablet Commonly known as: TOFRANIL  Take 50 mg by mouth at bedtime.   meloxicam 7.5 MG tablet Commonly known as: MOBIC Take 7.5 mg by mouth.   metoprolol  succinate 100 MG 24 hr tablet Commonly known as: TOPROL -XL Take 25 mg by mouth daily.   MULTIVITAMIN PO Take 1 tablet by mouth daily.   omeprazole 20 MG capsule Commonly known as: PRILOSEC Take 20 mg by mouth daily.   rizatriptan 10 MG tablet Commonly known as: MAXALT Take 10 mg by mouth as needed for migraine.   tamsulosin  0.4 MG Caps capsule Commonly known as: FLOMAX  Take 1 capsule (0.4 mg total) by mouth daily after breakfast.   venlafaxine  XR 150 MG 24 hr capsule Commonly known as: EFFEXOR -XR TAKE 1 CAPSULE BY MOUTH EVERY DAY WITH BREAKFAST   vitamin C 100 MG tablet Take 500 mg by mouth daily.   Zinc 22.5 MG Tabs Take 1 tablet by mouth daily.        Allergies:  Allergies  Allergen Reactions   Oxycodone-Acetaminophen     Percocet [Oxycodone-Acetaminophen ] Other (See  Comments)    Arms and legs go numb   Sulfa Antibiotics Nausea And Vomiting    Family History: Family History  Problem Relation Age of Onset   Hyperlipidemia Mother    Hypertension Mother    Depression Mother    Colon cancer Mother 55   Alcohol abuse Father    Hyperlipidemia Father    Stroke Father    Hypertension Father    Depression Father    Heart disease Father    Prostate cancer Father 61   Arthritis Maternal Grandmother    Hypertension Maternal Grandmother    Depression Maternal Grandmother    Arthritis Maternal Grandfather    Stroke Maternal Grandfather    Hypertension Maternal Grandfather    Depression Maternal Grandfather    Hypertension Paternal Grandmother    Depression Paternal Grandmother    Hypertension Paternal Grandfather    Depression Paternal Grandfather    Leukemia Maternal Aunt        d <50   Cancer Paternal Aunt        unsure types  Social History:   reports that she has never smoked. She has never used smokeless tobacco. She reports current alcohol use. She reports that she does not use drugs.  Physical Exam: BP 135/81 (BP Location: Left Arm, Patient Position: Sitting, Cuff Size: Normal)   Pulse 80   Ht 5' 4 (1.626 m)   Wt 170 lb (77.1 kg)   SpO2 93%   BMI 29.18 kg/m   Constitutional:  Alert and oriented, no acute distress, nontoxic appearing HEENT: [place], AT Cardiovascular: No clubbing, cyanosis, or edema Respiratory: Normal respiratory effort, no increased work of breathing Skin: No rashes, bruises or suspicious lesions Neurologic: Grossly intact, no focal deficits, moving all 4 extremities Psychiatric: Normal mood and affect  Laboratory Data: Results for orders placed or performed in visit on 08/19/23  Microscopic Examination   Collection Time: 08/19/23 12:54 PM   Urine  Result Value Ref Range   WBC, UA 6-10 (A) 0 - 5 /hpf   RBC, Urine 11-30 (A) 0 - 2 /hpf   Epithelial Cells (non renal) >10 (A) 0 - 10 /hpf   Bacteria, UA Moderate  (A) None seen/Few  Urinalysis, Complete   Collection Time: 08/19/23 12:54 PM  Result Value Ref Range   Specific Gravity, UA 1.020 1.005 - 1.030   pH, UA 6.0 5.0 - 7.5   Color, UA Yellow Yellow   Appearance Ur Clear Clear   Leukocytes,UA Trace (A) Negative   Protein,UA Negative Negative/Trace   Glucose, UA Negative Negative   Ketones, UA Negative Negative   RBC, UA 1+ (A) Negative   Bilirubin, UA Negative Negative   Urobilinogen, Ur 0.2 0.2 - 1.0 mg/dL   Nitrite, UA Negative Negative   Microscopic Examination See below:    Pertinent Imaging: KUB, 11/24/2023: See Epic  I personally reviewed the images referenced above and note stable distal left ureteral stones.  Assessment & Plan:   1. Left ureteral stone (Primary) Stable distal left ureteral stones on KUB today, UA bland with no signs of infection.  Given duration of the stone episode and her multiplicity of ureteral stones as well as numerous left renal stones, I recommended definitive management with left ureteroscopy with laser lithotripsy and stent placement and she agreed.  We discussed that she will need a ureteral stent postoperatively and this can cause discomfort and gross hematuria.  Will send UA for preop culture today.  She has pain meds and Flomax  on hand.  I am prescribing Zofran  for symptom control. - Urinalysis, Complete - CULTURE, URINE COMPREHENSIVE - ondansetron  (ZOFRAN -ODT) 4 MG disintegrating tablet; Take 1 tablet (4 mg total) by mouth every 8 (eight) hours as needed for nausea or vomiting.  Dispense: 20 tablet; Refill: 0 - Ambulatory Referral For Surgery Scheduling   Return for Will call to schedule surgery.  Lucie Hones, PA-C  [facility] [place] [address], [address] [place], [place] [address] [phone]

## 2023-11-24 NOTE — Progress Notes (Signed)
   [facility] Surgical Posting Form  Surgery Date: Date: 12/06/2023  Surgeon: Dr. Penne Skye, MD  Inpt ( No  )   Outpt (Yes)   Obs ( No  )   Diagnosis: N20.1 Left Ureteral Stone  -CPT: [phone]  Surgery: Left Ureteroscopy with Laser Lithotripsy and Stent Placement  Stop Anticoagulations: No  Cardiac/Medical/Pulmonary Clearance needed: No  *Orders entered into EPIC  Date: 11/24/23   *Case booked in [place]  Date: 11/24/23  *Notified pt of Surgery: Date: 11/24/23  PRE-OP UA & CX: no  *Placed into Prior Authorization Work [place] Date: 11/24/23  Assistant/laser/rep:No

## 2023-11-29 ENCOUNTER — Encounter
Admission: RE | Admit: 2023-11-29 | Discharge: 2023-11-29 | Disposition: A | Discharge status: Home / Self Care | Source: Ambulatory Visit | Attending: Urology | Admitting: Urology

## 2023-11-29 ENCOUNTER — Other Ambulatory Visit: Payer: Self-pay

## 2023-11-29 DIAGNOSIS — I10 Essential (primary) hypertension: Secondary | ICD-10-CM

## 2023-11-29 DIAGNOSIS — N23 Unspecified renal colic: Secondary | ICD-10-CM

## 2023-11-29 DIAGNOSIS — Z01812 Encounter for preprocedural laboratory examination: Secondary | ICD-10-CM

## 2023-11-29 DIAGNOSIS — Z01810 Encounter for preprocedural cardiovascular examination: Secondary | ICD-10-CM

## 2023-11-29 DIAGNOSIS — N200 Calculus of kidney: Secondary | ICD-10-CM

## 2023-11-29 DIAGNOSIS — Z79899 Other long term (current) drug therapy: Secondary | ICD-10-CM

## 2023-11-29 HISTORY — DX: Obesity, unspecified: E66.9

## 2023-11-29 NOTE — Patient Instructions (Addendum)
 Your procedure is scheduled on:12-06-23 Tuesday Report to the Registration Desk on the 1st floor of the [facility].Then proceed to the 2nd floor Surgery Desk To find out your arrival time, please call [phone] between 1PM - 3PM on:12-05-23 Monday If your arrival time is 6:00 am, do not arrive before that time as the [facility] entrance doors do not open until 6:00 am.  REMEMBER: Instructions that are not followed completely may result in serious medical risk, up to and including death; or upon the discretion of your surgeon and anesthesiologist your surgery may need to be rescheduled.  Do not eat food OR drink liquids after midnight the night before surgery.  No gum chewing or hard candies.  One week prior to surgery:Stop NOW (11-29-23) Stop Anti-inflammatories (NSAIDS) such as meloxicam (MOBIC) Advil , Aleve, Ibuprofen , Motrin , Naproxen, Naprosyn and Aspirin based products such as Excedrin, Goody's Powder, BC Powder. Stop ANY OVER THE COUNTER supplements until after surgery (Vitamin C, Calcium, Multivitamin, Zinc)  You may however, continue to take Tylenol  if needed for pain up until the day of surgery.  Continue taking all of your other prescription medications up until the day of surgery.  ON THE DAY OF SURGERY ONLY TAKE THESE MEDICATIONS WITH SIPS OF WATER: -omeprazole (PRILOSEC)  -venlafaxine  XR (EFFEXOR -XR)   No Alcohol for 24 hours before or after surgery.  No Smoking including e-cigarettes for 24 hours before surgery.  No chewable tobacco products for at least 6 hours before surgery.  No nicotine patches on the day of surgery.  Do not use any recreational drugs for at least a week (preferably 2 weeks) before your surgery.  Please be advised that the combination of cocaine and anesthesia may have negative outcomes, up to and including death. If you test positive for cocaine, your surgery will be cancelled.  On the morning of surgery brush your teeth with toothpaste  and water, you may rinse your mouth with mouthwash if you wish. Do not swallow any toothpaste or mouthwash.  Do not wear jewelry, make-up, hairpins, clips or nail polish.  For welded (permanent) jewelry: bracelets, anklets, waist bands, etc.  Please have this removed prior to surgery.  If it is not removed, there is a chance that hospital personnel will need to cut it off on the day of surgery.  Do not wear lotions, powders, or perfumes.   Do not shave body hair from the neck down 48 hours before surgery.  Contact lenses, hearing aids and dentures may not be worn into surgery.  Do not bring valuables to the hospital. [facility] is not responsible for any missing/lost belongings or valuables.   Notify your doctor if there is any change in your medical condition (cold, fever, infection).  Wear comfortable clothing (specific to your surgery type) to the hospital.  After surgery, you can help prevent lung complications by doing breathing exercises.  Take deep breaths and cough every 1-2 hours. Your doctor may order a device called an Incentive Spirometer to help you take deep breaths. When coughing or sneezing, hold a pillow firmly against your incision with both hands. This is called "splinting." Doing this helps protect your incision. It also decreases belly discomfort.  If you are being admitted to the hospital overnight, leave your suitcase in the car. After surgery it may be brought to your room.  In case of increased patient census, it may be necessary for you, the patient, to continue your postoperative care in the Same Day Surgery department.  If you are being discharged the day of surgery, you will not be allowed to drive home. You will need a responsible individual to drive you home and stay with you for 24 hours after surgery.   If you are taking public transportation, you will need to have a responsible individual with you.  Please call the Pre-admissions Testing Dept. at  [phone] if you have any questions about these instructions.  Surgery Visitation Policy:  Patients having surgery or a procedure may have two visitors.  Children under the age of 69 must have an adult with them who is not the patient.  Merchandiser, retail to address health-related social needs:  [email or web address]

## 2023-11-30 ENCOUNTER — Ambulatory Visit: Payer: Self-pay | Admitting: Physician Assistant

## 2023-11-30 LAB — CULTURE, URINE COMPREHENSIVE

## 2023-12-02 ENCOUNTER — Encounter
Admission: RE | Admit: 2023-12-02 | Discharge: 2023-12-02 | Disposition: A | Discharge status: Home / Self Care | Source: Ambulatory Visit | Attending: Urology | Admitting: Urology

## 2023-12-02 DIAGNOSIS — Z01812 Encounter for preprocedural laboratory examination: Secondary | ICD-10-CM

## 2023-12-02 DIAGNOSIS — Z01810 Encounter for preprocedural cardiovascular examination: Secondary | ICD-10-CM

## 2023-12-02 DIAGNOSIS — Z79899 Other long term (current) drug therapy: Secondary | ICD-10-CM | POA: Insufficient documentation

## 2023-12-02 DIAGNOSIS — Z01818 Encounter for other preprocedural examination: Secondary | ICD-10-CM | POA: Insufficient documentation

## 2023-12-02 DIAGNOSIS — I10 Essential (primary) hypertension: Secondary | ICD-10-CM | POA: Diagnosis not present

## 2023-12-02 LAB — BASIC METABOLIC PANEL WITH GFR
Anion gap: 12 (ref 5–15)
BUN: 23 mg/dL — ABNORMAL HIGH (ref 6–20)
CO2: 27 mmol/L (ref 22–32)
Calcium: 9.9 mg/dL (ref 8.9–10.3)
Chloride: 101 mmol/L (ref 98–111)
Creatinine, Ser: 0.58 mg/dL (ref 0.44–1.00)
GFR, Estimated: >60 mL/min (ref >60)
Glucose, Bld: 113 mg/dL — ABNORMAL HIGH (ref 70–99)
Potassium: 3.9 mmol/L (ref 3.5–5.1)
Sodium: 140 mmol/L (ref 135–145)

## 2023-12-02 LAB — CBC
HCT: 40.7 % (ref 36.0–46.0)
Hemoglobin: 13 g/dL (ref 12.0–15.0)
MCH: 29.1 pg (ref 26.0–34.0)
MCHC: 31.9 g/dL (ref 30.0–36.0)
MCV: 91.3 fL (ref 80.0–100.0)
Platelets: 241 K/uL (ref 150–400)
RBC: 4.46 MIL/uL (ref 3.87–5.11)
RDW: 12.3 % (ref 11.5–15.5)
WBC: 8.8 K/uL (ref 4.0–10.5)
nRBC: 0 % (ref 0.0–0.2)

## 2023-12-06 ENCOUNTER — Encounter
Admission: RE | Disposition: A | Discharge status: Home / Self Care | Payer: Self-pay | Source: Home / Self Care | Attending: Urology

## 2023-12-06 ENCOUNTER — Ambulatory Visit
Admission: RE | Admit: 2023-12-06 | Discharge: 2023-12-06 | Disposition: A | Discharge status: Home / Self Care | Attending: Urology | Admitting: Urology

## 2023-12-06 ENCOUNTER — Emergency Department
Admission: EM | Admit: 2023-12-06 | Discharge: 2023-12-06 | Disposition: A | Discharge status: Home / Self Care | Source: Home / Self Care | Attending: Emergency Medicine | Admitting: Emergency Medicine

## 2023-12-06 ENCOUNTER — Ambulatory Visit

## 2023-12-06 ENCOUNTER — Other Ambulatory Visit: Payer: Self-pay

## 2023-12-06 DIAGNOSIS — K219 Gastro-esophageal reflux disease without esophagitis: Secondary | ICD-10-CM | POA: Insufficient documentation

## 2023-12-06 DIAGNOSIS — K760 Fatty (change of) liver, not elsewhere classified: Secondary | ICD-10-CM | POA: Diagnosis not present

## 2023-12-06 DIAGNOSIS — Z79899 Other long term (current) drug therapy: Secondary | ICD-10-CM | POA: Diagnosis not present

## 2023-12-06 DIAGNOSIS — N202 Calculus of kidney with calculus of ureter: Secondary | ICD-10-CM | POA: Diagnosis not present

## 2023-12-06 DIAGNOSIS — T83122A Displacement of urinary stent, initial encounter: Secondary | ICD-10-CM | POA: Insufficient documentation

## 2023-12-06 DIAGNOSIS — I10 Essential (primary) hypertension: Secondary | ICD-10-CM | POA: Insufficient documentation

## 2023-12-06 DIAGNOSIS — Y732 Prosthetic and other implants, materials and accessory gastroenterology and urology devices associated with adverse incidents: Secondary | ICD-10-CM | POA: Insufficient documentation

## 2023-12-06 DIAGNOSIS — N201 Calculus of ureter: Secondary | ICD-10-CM

## 2023-12-06 DIAGNOSIS — F419 Anxiety disorder, unspecified: Secondary | ICD-10-CM | POA: Diagnosis not present

## 2023-12-06 DIAGNOSIS — F32A Depression, unspecified: Secondary | ICD-10-CM | POA: Insufficient documentation

## 2023-12-06 LAB — URINALYSIS, ROUTINE W REFLEX MICROSCOPIC
Bacteria, UA: NONE SEEN
Bilirubin Urine: NEGATIVE
Glucose, UA: NEGATIVE mg/dL
Ketones, ur: NEGATIVE mg/dL
Nitrite: POSITIVE — AB
Protein, ur: 100 mg/dL — AB
RBC / HPF: >50 RBC/hpf (ref 0–5)
Specific Gravity, Urine: 1.021 (ref 1.005–1.030)
WBC, UA: >50 WBC/hpf (ref 0–5)
pH: 6 (ref 5.0–8.0)

## 2023-12-06 LAB — COMPREHENSIVE METABOLIC PANEL WITH GFR
ALT: 43 U/L (ref 0–44)
AST: 38 U/L (ref 15–41)
Albumin: 4 g/dL (ref 3.5–5.0)
Alkaline Phosphatase: 95 U/L (ref 38–126)
Anion gap: 14 (ref 5–15)
BUN: 17 mg/dL (ref 6–20)
CO2: 21 mmol/L — ABNORMAL LOW (ref 22–32)
Calcium: 9.5 mg/dL (ref 8.9–10.3)
Chloride: 104 mmol/L (ref 98–111)
Creatinine, Ser: 0.78 mg/dL (ref 0.44–1.00)
GFR, Estimated: >60 mL/min (ref >60)
Glucose, Bld: 183 mg/dL — ABNORMAL HIGH (ref 70–99)
Potassium: 3.9 mmol/L (ref 3.5–5.1)
Sodium: 139 mmol/L (ref 135–145)
Total Bilirubin: 0.6 mg/dL (ref 0.0–1.2)
Total Protein: 7.6 g/dL (ref 6.5–8.1)

## 2023-12-06 LAB — CBC WITH DIFFERENTIAL/PLATELET
Abs Immature Granulocytes: 0.06 K/uL (ref 0.00–0.07)
Basophils Absolute: 0 K/uL (ref 0.0–0.1)
Basophils Relative: 0 %
Eosinophils Absolute: 0 K/uL (ref 0.0–0.5)
Eosinophils Relative: 0 %
HCT: 41.6 % (ref 36.0–46.0)
Hemoglobin: 13.6 g/dL (ref 12.0–15.0)
Immature Granulocytes: 1 %
Lymphocytes Relative: 13 %
Lymphs Abs: 1.4 K/uL (ref 0.7–4.0)
MCH: 29.6 pg (ref 26.0–34.0)
MCHC: 32.7 g/dL (ref 30.0–36.0)
MCV: 90.4 fL (ref 80.0–100.0)
Monocytes Absolute: 0.1 K/uL (ref 0.1–1.0)
Monocytes Relative: 1 %
Neutro Abs: 9.3 K/uL — ABNORMAL HIGH (ref 1.7–7.7)
Neutrophils Relative %: 85 %
Platelets: 266 K/uL (ref 150–400)
RBC: 4.6 MIL/uL (ref 3.87–5.11)
RDW: 11.9 % (ref 11.5–15.5)
WBC: 10.8 K/uL — ABNORMAL HIGH (ref 4.0–10.5)
nRBC: 0 % (ref 0.0–0.2)

## 2023-12-06 SURGERY — CYSTOSCOPY/URETEROSCOPY/HOLMIUM LASER/STENT PLACEMENT
Anesthesia: General | Site: Ureter | Laterality: Left

## 2023-12-06 MED ORDER — ACETAMINOPHEN 10 MG/ML IV SOLN: 1000.0000 mg | Freq: Once | INTRAVENOUS | Status: DC | PRN

## 2023-12-06 MED ORDER — OXYCODONE HCL 5 MG PO TABS: 5.0000 mg | ORAL_TABLET | Freq: Once | ORAL | Status: DC | PRN

## 2023-12-06 MED ORDER — ACETAMINOPHEN 10 MG/ML IV SOLN
INTRAVENOUS | Status: AC
  Filled 2023-12-06: qty 100

## 2023-12-06 MED ORDER — PHENAZOPYRIDINE HCL 200 MG PO TABS: 200.0000 mg | ORAL_TABLET | Freq: Three times a day (TID) | ORAL | 0 refills | Status: AC | PRN

## 2023-12-06 MED ORDER — LIDOCAINE HCL (CARDIAC) PF 100 MG/5ML IV SOSY
PREFILLED_SYRINGE | INTRAVENOUS | Status: DC | PRN
  Administered 2023-12-06: 100 mg via INTRAVENOUS

## 2023-12-06 MED ORDER — CEFAZOLIN SODIUM-DEXTROSE 2-4 GM/100ML-% IV SOLN
2.0000 g | INTRAVENOUS | Status: AC
  Administered 2023-12-06: 2 g via INTRAVENOUS

## 2023-12-06 MED ORDER — CHLORHEXIDINE GLUCONATE 0.12 % MT SOLN
15.0000 mL | Freq: Once | OROMUCOSAL | Status: AC
  Administered 2023-12-06: 15 mL via OROMUCOSAL

## 2023-12-06 MED ORDER — LACTATED RINGERS IV SOLN: INTRAVENOUS | Status: DC

## 2023-12-06 MED ORDER — CIPROFLOXACIN HCL 500 MG PO TABS: ORAL_TABLET | ORAL | 0 refills | Status: DC

## 2023-12-06 MED ORDER — FENTANYL CITRATE (PF) 100 MCG/2ML IJ SOLN
INTRAMUSCULAR | Status: DC | PRN
  Administered 2023-12-06 (×2): 50 ug via INTRAVENOUS

## 2023-12-06 MED ORDER — ONDANSETRON HCL 4 MG/2ML IJ SOLN: 4.0000 mg | Freq: Once | INTRAMUSCULAR | Status: DC | PRN

## 2023-12-06 MED ORDER — STERILE WATER FOR IRRIGATION IR SOLN
Status: DC | PRN
  Administered 2023-12-06: 500 mL

## 2023-12-06 MED ORDER — CEFAZOLIN SODIUM-DEXTROSE 2-4 GM/100ML-% IV SOLN
INTRAVENOUS | Status: AC
Start: 2023-12-06 — End: 2023-12-06
  Filled 2023-12-06: qty 100

## 2023-12-06 MED ORDER — ACETAMINOPHEN 10 MG/ML IV SOLN
INTRAVENOUS | Status: DC | PRN
  Administered 2023-12-06: 1000 mg via INTRAVENOUS

## 2023-12-06 MED ORDER — OXYCODONE HCL 5 MG/5ML PO SOLN: 5.0000 mg | Freq: Once | ORAL | Status: DC | PRN

## 2023-12-06 MED ORDER — DEXAMETHASONE SOD PHOSPHATE PF 10 MG/ML IJ SOLN
INTRAMUSCULAR | Status: DC | PRN
  Administered 2023-12-06: 10 mg via INTRAVENOUS

## 2023-12-06 MED ORDER — ORAL CARE MOUTH RINSE: 15.0000 mL | Freq: Once | OROMUCOSAL | Status: AC

## 2023-12-06 MED ORDER — CHLORHEXIDINE GLUCONATE 0.12 % MT SOLN
OROMUCOSAL | Status: AC
Start: 2023-12-06 — End: 2023-12-06
  Filled 2023-12-06: qty 15

## 2023-12-06 MED ORDER — FENTANYL CITRATE (PF) 100 MCG/2ML IJ SOLN
INTRAMUSCULAR | Status: AC
  Filled 2023-12-06: qty 2

## 2023-12-06 MED ORDER — ONDANSETRON HCL 4 MG/2ML IJ SOLN
INTRAMUSCULAR | Status: DC | PRN
  Administered 2023-12-06: 4 mg via INTRAVENOUS

## 2023-12-06 MED ORDER — TAMSULOSIN HCL 0.4 MG PO CAPS: 0.4000 mg | ORAL_CAPSULE | Freq: Every day | ORAL | 0 refills | Status: AC

## 2023-12-06 MED ORDER — OXYBUTYNIN CHLORIDE ER 5 MG PO TB24: 5.0000 mg | ORAL_TABLET | Freq: Every day | ORAL | 0 refills | Status: AC

## 2023-12-06 MED ORDER — MIDAZOLAM HCL 2 MG/2ML IJ SOLN
INTRAMUSCULAR | Status: AC
  Filled 2023-12-06: qty 2

## 2023-12-06 MED ORDER — IOHEXOL 180 MG/ML  SOLN
INTRAMUSCULAR | Status: DC | PRN
  Administered 2023-12-06: 20 mL

## 2023-12-06 MED ORDER — FENTANYL CITRATE (PF) 100 MCG/2ML IJ SOLN: 25.0000 ug | INTRAMUSCULAR | Status: DC | PRN

## 2023-12-06 MED ORDER — SODIUM CHLORIDE 0.9 % IR SOLN
Status: DC | PRN
  Administered 2023-12-06: 3000 mL

## 2023-12-06 MED ORDER — MIDAZOLAM HCL (PF) 2 MG/2ML IJ SOLN
INTRAMUSCULAR | Status: DC | PRN
  Administered 2023-12-06: 2 mg via INTRAVENOUS

## 2023-12-06 MED ORDER — PROPOFOL 10 MG/ML IV BOLUS
INTRAVENOUS | Status: DC | PRN
  Administered 2023-12-06: 180 mg via INTRAVENOUS

## 2023-12-06 SURGICAL SUPPLY — 24 items
ADHESIVE MASTISOL STRL (MISCELLANEOUS) IMPLANT
BAG DRAIN SIEMENS DORNER NS (MISCELLANEOUS) ×2 IMPLANT
BAG PRESSURE INF REUSE 3000 (BAG) ×2 IMPLANT
BASKET ZERO TIP 1.9FR (BASKET) IMPLANT
BRUSH SCRUB EZ 4% CHG (MISCELLANEOUS) ×2 IMPLANT
CATH URET FLEX-TIP 2 LUMEN 10F (CATHETERS) IMPLANT
CATH URETL OPEN 5X70 (CATHETERS) IMPLANT
DRAPE UTILITY 15X26 TOWEL STRL (DRAPES) ×2 IMPLANT
DRSG TEGADERM 2-3/8X2-3/4 SM (GAUZE/BANDAGES/DRESSINGS) IMPLANT
FIBER LASER MOSES 200 DFL (Laser) IMPLANT
GLOVE BIOGEL PI IND STRL 7.0 (GLOVE) ×2 IMPLANT
GOWN STRL REUS W/ TWL LRG LVL3 (GOWN DISPOSABLE) ×4 IMPLANT
GUIDEWIRE STR DUAL SENSOR (WIRE) ×2 IMPLANT
KIT TURNOVER CYSTO (KITS) ×2 IMPLANT
PACK CYSTO AR (MISCELLANEOUS) ×2 IMPLANT
SET CYSTO IRRIGATION (SET/KITS/TRAYS/PACK) ×2 IMPLANT
SHEATH NAVIGATOR HD 12/14X36 (SHEATH) IMPLANT
SOL .9 NS 3000ML IRR UROMATIC (IV SOLUTION) ×2 IMPLANT
STENT URET 6FRX24 CONTOUR (STENTS) IMPLANT
STENT URET 6FRX26 CONTOUR (STENTS) IMPLANT
SURGILUBE 2OZ TUBE FLIPTOP (MISCELLANEOUS) ×2 IMPLANT
SYR 10ML LL (SYRINGE) ×2 IMPLANT
VALVE UROSEAL ADJ ENDO (VALVE) IMPLANT
WATER STERILE IRR 500ML POUR (IV SOLUTION) ×2 IMPLANT

## 2023-12-06 NOTE — Anesthesia Preprocedure Evaluation (Addendum)
 Anesthesia Evaluation  Patient identified by MRN, date of birth, ID band Patient awake    Reviewed: Allergy & Precautions, NPO status , Patient's Chart, lab work & pertinent test results  History of Anesthesia Complications Negative for: history of anesthetic complications  Airway Mallampati: II   Neck ROM: Full    Dental no notable dental hx.    Pulmonary neg pulmonary ROS   Pulmonary exam normal breath sounds clear to auscultation       Cardiovascular hypertension, Normal cardiovascular exam Rhythm:Regular Rate:Normal  ECG 12/02/23:  Normal sinus rhythm Nonspecific ST and T wave abnormality Abnormal ECG When compared with ECG of 24-Aug-2019 10:36, No significant change was found   Neuro/Psych  Headaches PSYCHIATRIC DISORDERS Anxiety Depression       GI/Hepatic ,GERD  Medicated and Controlled,,NAFLD   Endo/Other  Prediabetes   Renal/GU      Musculoskeletal   Abdominal   Peds  Hematology Breast CA   Anesthesia Other Findings   Reproductive/Obstetrics                              Anesthesia Physical Anesthesia Plan  ASA: 2  Anesthesia Plan: General   Post-op Pain Management:    Induction: Intravenous  PONV Risk Score and Plan: 3 and Ondansetron , Dexamethasone  and Treatment may vary due to age or medical condition  Airway Management Planned: LMA  Additional Equipment:   Intra-op Plan:   Post-operative Plan: Extubation in OR  Informed Consent: I have reviewed the patients History and Physical, chart, labs and discussed the procedure including the risks, benefits and alternatives for the proposed anesthesia with the patient or authorized representative who has indicated his/her understanding and acceptance.     Dental advisory given  Plan Discussed with: CRNA  Anesthesia Plan Comments: (Patient consented for risks of anesthesia including but not limited to:  - adverse  reactions to medications - damage to eyes, teeth, lips or other oral mucosa - nerve damage due to positioning  - sore throat or hoarseness - damage to heart, brain, nerves, lungs, other parts of body or loss of life  Informed patient about role of CRNA in peri- and intra-operative care.  Patient voiced understanding.)         Anesthesia Quick Evaluation

## 2023-12-06 NOTE — ED Provider Notes (Signed)
 [facility] Provider Note    Event Date/Time   First MD Initiated Contact with Patient 12/06/23 2134     (approximate)   History   Chief Complaint Post-op Problem   HPI  Tiffany Acevedo is a 58 y.o. female with past medical history of hypertension, migraines, kidney stones who presents to the ED complaining of postop problem.  Patient reports that she had ureteral stent placed following ureteroscopy with Dr. Donette of urology earlier this morning.  Later in the day, she noticed that the strings of the stent seem to be sticking out much more than they were initially.  She also states that she could not to control the flow of her urine, has been leaking urine at all times and having to wear an adult diaper.  She denies significant blood in her urine or dysuria, describes some discomfort associated with the stent but without significant pain.      Physical Exam   Triage Vital Signs: ED Triage Vitals  Encounter Vitals Group     BP 12/06/23 2017 (!) 147/80     Girls Systolic BP Percentile --      Girls Diastolic BP Percentile --      Boys Systolic BP Percentile --      Boys Diastolic BP Percentile --      Pulse Rate 12/06/23 2017 (!) 118     Resp 12/06/23 2017 16     Temp 12/06/23 2017 99.2 F (37.3 C)     Temp Source 12/06/23 2017 Oral     SpO2 12/06/23 2017 96 %     Weight 12/06/23 2018 169 lb 15.6 oz (77.1 kg)     Height 12/06/23 2018 5' 4 (1.626 m)     Head Circumference --      Peak Flow --      Pain Score 12/06/23 2018 4     Pain Loc --      Pain Education --      Exclude from Growth Chart --     Most recent vital signs: Vitals:   12/06/23 2017 12/06/23 2341  BP: (!) 147/80 (!) 141/82  Pulse: (!) 118 98  Resp: 16 18  Temp: 99.2 F (37.3 C) 99.2 F (37.3 C)  SpO2: 96% 95%    Constitutional: Alert and oriented. Eyes: Conjunctivae are normal. Head: Atraumatic. Nose: No congestion/rhinnorhea. Mouth/Throat: Mucous  membranes are moist.  Cardiovascular: Normal rate, regular rhythm. Grossly normal heart sounds.  2+ radial pulses bilaterally. Respiratory: Normal respiratory effort.  No retractions. Lungs CTAB. Gastrointestinal: Soft and nontender. No distention. Genitourinary: Blue portion of stent exposed from urethra. Musculoskeletal: No lower extremity tenderness nor edema.  Neurologic:  Normal speech and language. No gross focal neurologic deficits are appreciated.    ED Results / Procedures / Treatments   Labs (all labs ordered are listed, but only abnormal results are displayed) Labs Reviewed  COMPREHENSIVE METABOLIC PANEL WITH GFR - Abnormal; Notable for the following components:      Result Value   CO2 21 (*)    Glucose, Bld 183 (*)    All other components within normal limits  CBC WITH DIFFERENTIAL/PLATELET - Abnormal; Notable for the following components:   WBC 10.8 (*)    Neutro Abs 9.3 (*)    All other components within normal limits  URINALYSIS, ROUTINE W REFLEX MICROSCOPIC - Abnormal; Notable for the following components:   Color, Urine AMBER (*)    APPearance HAZY (*)    Hgb  urine dipstick LARGE (*)    Protein, ur 100 (*)    Nitrite POSITIVE (*)    Leukocytes,Ua TRACE (*)    All other components within normal limits    PROCEDURES:  Critical Care performed: No  Procedures   MEDICATIONS ORDERED IN ED: Medications - No data to display   IMPRESSION / MDM / ASSESSMENT AND PLAN / ED COURSE  I reviewed the triage vital signs and the nursing notes.                              58 y.o. female with a past medical history of hypertension, migraines, and kidney stones who presents to the ED complaining of urinary incontinence and exposed straining from stent following placement earlier today.  Patient's presentation is most consistent with acute presentation with potential threat to life or bodily function.  Differential diagnosis includes, but is not limited to, displaced  ureteral stent, UTI, kidney stone.  Patient well-appearing and in no acute distress, vital signs are unremarkable.  She has a benign abdominal exam without CVA tenderness.  Labs without significant anemia, leukocytosis, electrolyte abnormality, or AKI.  Urinalysis with significant white blood cells and red blood cells, however this would be expected following stent placement.  Findings reviewed with Dr. Georganne of urology, who states that stent may be pulled if it is exposed from the urethra.  Further examination reveals stent to be exposed from the urethra and it was removed without difficulty.  Patient reports resolution of urinary incontinence following stent removal and she is appropriate for discharge home with urology follow-up.  She was counseled to return to the ED for new or worsening symptoms, patient agrees with plan.      FINAL CLINICAL IMPRESSION(S) / ED DIAGNOSES   Final diagnoses:  Displacement of ureteral stent, initial encounter     Rx / DC Orders   ED Discharge Orders     None        Note:  This document was prepared using Dragon voice recognition software and may include unintentional dictation errors.   Willo Dunnings, MD 12/06/23 [phone]

## 2023-12-06 NOTE — Anesthesia Postprocedure Evaluation (Signed)
 Anesthesia Post Note  Patient: Tiffany Acevedo  Procedure(s) Performed: CYSTOSCOPY/URETEROSCOPY/HOLMIUM LASER/STENT PLACEMENT (Left: Ureter)  Patient location during evaluation: PACU Anesthesia Type: General Level of consciousness: awake and alert, oriented and patient cooperative Pain management: pain level controlled Vital Signs Assessment: post-procedure vital signs reviewed and stable Respiratory status: spontaneous breathing, nonlabored ventilation and respiratory function stable Cardiovascular status: blood pressure returned to baseline and stable Postop Assessment: adequate PO intake Anesthetic complications: no   No notable events documented.   Last Vitals:  Vitals:   12/06/23 0945 12/06/23 0953  BP: 109/67   Pulse: 79 74  Resp: 16 11  Temp:  36.7 C  SpO2: 96% 98%    Last Pain:  Vitals:   12/06/23 0945  TempSrc:   PainSc: 0-No pain                 Tya Haughey

## 2023-12-06 NOTE — Transfer of Care (Signed)
 Immediate Anesthesia Transfer of Care Note  Patient: Tiffany Acevedo  Procedure(s) Performed: CYSTOSCOPY/URETEROSCOPY/HOLMIUM LASER/STENT PLACEMENT (Left: Ureter)  Patient Location: PACU  Anesthesia Type:General  Level of Consciousness: drowsy and patient cooperative  Airway & Oxygen Therapy: Patient Spontanous Breathing and Patient connected to face mask oxygen  Post-op Assessment: Report given to RN, Post -op Vital signs reviewed and stable, and Patient moving all extremities X 4  Post vital signs: Reviewed and stable  Last Vitals:  Vitals Value Taken Time  BP 118/77 12/06/23 09:27  Temp    Pulse 81 12/06/23 09:29  Resp 12 12/06/23 09:29  SpO2 96 % 12/06/23 09:29  Vitals shown include unfiled device data.  Last Pain:  Vitals:   12/06/23 0805  TempSrc: Temporal  PainSc: 0-No pain         Complications: No notable events documented.

## 2023-12-06 NOTE — Discharge Instructions (Addendum)
 DISCHARGE INSTRUCTIONS FOR KIDNEY STONE/URETERAL STENT   MEDICATIONS:  1.  Resume all your other meds from home - except do not take any extra narcotic pain meds that you may have at home.  2. Pyridium is to help with the burning/stinging when you urinate. 3. Take Cipro  one hour prior to removal of your stent.   ACTIVITY:  1. No strenuous activity x 1week  2. No driving while on narcotic pain medications  3. Drink plenty of water  4. Continue to walk at home - you can still get blood clots when you are at home, so keep active, but don't over do it.  5. May return to work/school tomorrow or when you feel ready   BATHING:  1. You can shower and we recommend daily showers  2. You have a string coming from your urethra: The stent string is attached to your ureteral stent. Do not pull on this.   SIGNS/SYMPTOMS TO CALL:  Please call us  if you have a fever greater than 101.5, uncontrolled nausea/vomiting, uncontrolled pain, dizziness, unable to urinate, bloody urine, chest pain, shortness of breath, leg swelling, leg pain, redness around wound, drainage from wound, or any other concerns or questions.   You can reach us  at [phone].   FOLLOW-UP:  1. You will have an appointment in 6 weeks with a ultrasound of your kidneys prior.   2. You have a string attached to your stent, you may remove it on 12/09/23. To do this, pull the strings until the stents are completely removed. You may feel an odd sensation in your back.    TYLENOL  1,000 mg @ 0850 am, repeat in 6 hrs if needed

## 2023-12-06 NOTE — ED Triage Notes (Signed)
 Patient ambulatory to triage with complaints of post op problem. Patient had uretal stent placed this morning after kidney stone surgery. States now the stent feels like it is coming out and she is peeing constantly.

## 2023-12-06 NOTE — Anesthesia Procedure Notes (Signed)
 Procedure Name: LMA Insertion Date/Time: 12/06/2023 8:37 AM  Performed by: Lennie Lamarr HERO, CRNAPre-anesthesia Checklist: Patient identified, Emergency Drugs available, Suction available and Patient being monitored Patient Re-evaluated:Patient Re-evaluated prior to induction Oxygen Delivery Method: Circle System Utilized Preoxygenation: Pre-oxygenation with 100% oxygen Induction Type: IV induction Ventilation: Mask ventilation without difficulty LMA: LMA inserted LMA Size: 4.0 Number of attempts: 1 Airway Equipment and Method: Bite block Placement Confirmation: positive ETCO2 Tube secured with: Tape Dental Injury: Teeth and Oropharynx as per pre-operative assessment

## 2023-12-06 NOTE — Op Note (Signed)
 Preoperative diagnosis:  Left ureteral stone   Postoperative diagnosis:  Left ureteral stones Left renal stones   Procedure:  Cystoscopy Left ureteroscopy with laser lithotripsy (ureteral stones) Left ureteroscopy with laser lithotripsy (renal stones) Left retrograde pyelography with interpretation  Left ureteral stent placement  Surgeon: Penne Skye, MD  Anesthesia: General  Complications: None  Intraoperative findings: Multiple stacked left distal ureteral stones with moderate impaction, ureteral inflammation. Fully treated with laser and basket extracted Numerous Left renal stones, mostly papillar attachments/ Randall's plaques. We were able to laser treat a significant port of stone burden in all calyces  Temporary Left 73F x 24 cm JJ ureteral stent with string placed  EBL: Minimal  Specimens: None  Indication: Tiffany Acevedo is a 58 y.o. 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 We have discussed the potential benefits and risks of the procedure, side effects of the proposed treatment, the likelihood of the patient achieving the goals of the procedure, and any potential problems that might occur during the procedure or recuperation. Informed consent has been obtained.  Description of procedure:  The patient was taken to the operating room and general anesthesia was induced.  The patient was placed in the dorsal lithotomy position, prepped and draped in the usual sterile fashion, and preoperative antibiotics were administered. A preoperative time-out was performed.   Cystourethroscopy was performed.  The patient's urethra was examined and was normal. The bladder was then systematically examined in its entirety. There was no evidence for any bladder  tumors, stones, or other mucosal pathology.  Bilateral ureteral orifices noted in orthotopic location.  Attention then turned to the Left ureteral orifice and a ureteral catheter was used to intubate the ureteral orifice. Omnipaque  contrast was injected through the ureteral catheter and a retrograde pyelogram was performed with findings: initially poor left distal RPG, contrast did not pass distal ureter due to stone impaction. Final post-treatment distal RPG showed normal ureteral contours, residual left hydronephrosis, no contrast extravasation.   A sensor guidewire was then advanced up the Left ureter into the renal pelvis under fluoroscopic guidance. We transitioned to a semi-rigid ureteroscope and a complete pyeloscopy was performed.  Stone burden was encountered at the distal ureter consistent with preoperative imaging.  A 200 m holmium laser was used to fully treat the stone with laser lithotripsy.  A wire stone basket was used to retrieve all sizable stone fragments.  Next we transition to a flexible ureteroscope and complete ureteroscopy and pyeloscopy was performed.  We encountered significant left renal stone burden within all calyces, a majority of which were attached to papillary stones /Randall's plaques.  We were able to fully treat all calyces with excellent reduction of total stone burden.   A pull-back retrograde pyelogram and direct ureteroscopy was performed. This did not reveal any residual stone fragments, filling defects or contrast extravasation.   We elected to place a temporary indwelling stent with a string. Our sensor guidewire was replaced and a 73F x 24 cm JJ stent , which was placed via fluoroscopic guidance. A spot KUB confirmed appropriate proximal and distal curl locations.   The bladder was then emptied and the procedure ended.  The patient appeared to tolerate the procedure well and without complications.  The patient was able to be awakened and transferred to the  recovery unit in satisfactory  condition.   Plan: - follow up in clinic in 4-6 weeks with [facility]US prior w/ Dr. Twylla  Penne Skye, M.D.

## 2023-12-06 NOTE — Interval H&P Note (Signed)
 History and Physical Interval Note:  12/06/2023 8:13 AM  Tiffany Acevedo  has presented today for surgery, with the diagnosis of Left Ureteral Stone.  The various methods of treatment have been discussed with the patient and family. After consideration of risks, benefits and other options for treatment, the patient has consented to  Procedure(s): CYSTOSCOPY/URETEROSCOPY/HOLMIUM LASER/STENT PLACEMENT (Left) as a surgical intervention.  The patient's history has been reviewed, patient examined, no change in status, stable for surgery.  I have reviewed the patient's chart and labs.  Questions were answered to the patient's satisfaction.    Cardiac: RRR Lungs: CTA bilaterally  Laterality: Left Procedure: Left URS/LL, possible stent placement  Informed consent obtained, we specifically discussed the risks of bleeding, infection, post-operative pain, need for additional procedures. Operative site appropriately marked where indicated  Tiffany JONELLE Skye, MD 12/06/2023   Tiffany Acevedo

## 2023-12-07 ENCOUNTER — Telehealth: Payer: Self-pay

## 2023-12-07 ENCOUNTER — Encounter: Payer: Self-pay | Admitting: Urology

## 2023-12-07 NOTE — Telephone Encounter (Signed)
 Pt LM on triage line  Pt is s/p left Ureteroscopy w/laser lithotripsy & stent placement with [facility] yesterday.   She went to ED last night for severe pain. Displacement/removal of stent.   Did take hydrocodone  last night for pain. Pain not as severe this am.   NO fevers Urinating ok Some blood in urine.   Per [facility] nsaids and tylenol  around the clock. Possible ureteral spasms.  Motrin  800 q8 h and Tyenol 1000 q 6h.   If she develops and fever, unable to urinate, or thick clots in urine contact office ASAP.   Pt voiced understanding.

## 2024-01-02 ENCOUNTER — Ambulatory Visit
Admission: RE | Admit: 2024-01-02 | Discharge: 2024-01-02 | Disposition: A | Discharge status: Home / Self Care | Source: Ambulatory Visit | Attending: Urology | Admitting: Urology

## 2024-01-02 DIAGNOSIS — N201 Calculus of ureter: Secondary | ICD-10-CM | POA: Insufficient documentation

## 2024-01-10 ENCOUNTER — Ambulatory Visit: Admitting: Urology

## 2024-01-16 ENCOUNTER — Ambulatory Visit: Admitting: Urology

## 2024-01-16 ENCOUNTER — Encounter: Payer: Self-pay | Admitting: Urology

## 2024-01-16 VITALS — BP 123/59 | HR 75 | Ht 64.0 in | Wt 172.0 lb

## 2024-01-16 DIAGNOSIS — N200 Calculus of kidney: Secondary | ICD-10-CM

## 2024-01-16 DIAGNOSIS — N3942 Incontinence without sensory awareness: Secondary | ICD-10-CM

## 2024-01-16 LAB — BLADDER SCAN AMB NON-IMAGING

## 2024-01-16 NOTE — Patient Instructions (Signed)

## 2024-01-16 NOTE — Progress Notes (Unsigned)
 01/16/2024 2:15 PM   Tiffany Acevedo 04/10/1965 [id number]  Referring provider: Marikay Eva POUR, PA [address] [facility] [place],  [place] [address]  Chief Complaint  Patient presents with   Nephrolithiasis    HPI: Tiffany Acevedo is a 58 y.o. female presents for follow-up visit.  Underwent left ureteroscopy with laser lithotripsy of left ureteral and renal calculi by Dr. Georganne 12/06/2023 Follow-up RUS 01/02/2024 showed bilateral renal cyst.  Echogenic appearance of the renal pyramids noted bilaterally consistent with medullary calcinosis.  No hydronephrosis or urinary calculi noted Since her surgery she has noted unaware incontinence which has been intermittent and in small volumes that has not been normal for her and she would like further evaluation No urge incontinence or dysuria.  She had a urinalysis today at [facility]   PMH: Past Medical History:  Diagnosis Date   Allergy    Anxiety    Breast cancer (HCC)    Depression    Excessive weight gain 06/30/2016   Family history of colon cancer    Family history of colon cancer    Family history of prostate cancer    Frequent headaches    GERD (gastroesophageal reflux disease)    History of kidney stones    Hypertension    Migraines    Obesity    Pre-diabetes     Surgical History: Past Surgical History:  Procedure Laterality Date   BREAST BIOPSY Right 07/13/2019   stereo biopsy/ x clip/path pending   BREAST BIOPSY Right 07/13/2019   stereo biopsy/coil clip/ path pending   BREAST RECONSTRUCTION WITH PLACEMENT OF TISSUE EXPANDER AND FLEX HD (ACELLULAR HYDRATED DERMIS) Bilateral 08/27/2019   Procedure: BILATERAL BREAST RECONSTRUCTION WITH PLACEMENT OF TISSUE EXPANDER AND FLEX HD (ACELLULAR HYDRATED DERMIS);  Surgeon: Lowery Estefana RAMAN, DO;  Location: [facility];  Service: Plastics;  Laterality: Bilateral;   CESAREAN SECTION  1994, 2004, 2013   COLONOSCOPY  2015    CYSTOSCOPY W/ RETROGRADES Right 09/13/2018   Procedure: CYSTOSCOPY WITH RETROGRADE PYELOGRAM;  Surgeon: Twylla Glendia BROCKS, MD;  Location: [facility];  Service: Urology;  Laterality: Right;   CYSTOSCOPY/URETEROSCOPY/HOLMIUM LASER/STENT PLACEMENT Left 05/09/2018   Procedure: CYSTOSCOPY/URETEROSCOPY/HOLMIUM LASER/STENT PLACEMENT;  Surgeon: Twylla Glendia BROCKS, MD;  Location: [facility];  Service: Urology;  Laterality: Left;   CYSTOSCOPY/URETEROSCOPY/HOLMIUM LASER/STENT PLACEMENT Right 09/13/2018   Procedure: CYSTOSCOPY/URETEROSCOPY/HOLMIUM LASER/STENT PLACEMENT;  Surgeon: Twylla Glendia BROCKS, MD;  Location: [facility];  Service: Urology;  Laterality: Right;   CYSTOSCOPY/URETEROSCOPY/HOLMIUM LASER/STENT PLACEMENT Left 12/06/2023   Procedure: CYSTOSCOPY/URETEROSCOPY/HOLMIUM LASER/STENT PLACEMENT;  Surgeon: Georganne Penne SAUNDERS, MD;  Location: [facility];  Service: Urology;  Laterality: Left;   INCONTINENCE SURGERY     BLADDER TUCK   LIPOSUCTION WITH LIPOFILLING Bilateral 05/05/2020   Procedure: LIPOSUCTION WITH LIPOFILLING; LIPOSUCTION OF EXCESS LATERAL BREAST TISSUE;  Surgeon: Lowery Estefana RAMAN, DO;  Location: [facility];  Service: Plastics;  Laterality: Bilateral;  90 min   PILONIDAL CYST EXCISION     REMOVAL OF BILATERAL TISSUE EXPANDERS WITH PLACEMENT OF BILATERAL BREAST IMPLANTS Bilateral 01/02/2020   Procedure: REMOVAL OF BILATERAL TISSUE EXPANDERS WITH PLACEMENT OF BILATERAL BREAST IMPLANTS;  Surgeon: Lowery Estefana RAMAN, DO;  Location: [facility];  Service: Plastics;  Laterality: Bilateral;  2 hours, please   STONE EXTRACTION WITH BASKET Right 09/13/2018   Procedure: STONE EXTRACTION WITH BASKET;  Surgeon: Twylla Glendia BROCKS, MD;  Location: [facility];  Service: Urology;  Laterality: Right;   TOTAL MASTECTOMY Bilateral 08/27/2019  Procedure: TOTAL MASTECTOMY;  Surgeon: Rodolph Romano, MD;  Location: [facility];  Service: General;  Laterality: Bilateral;  START @930  DUE TO SENTINEL NODE    UPPER GI ENDOSCOPY      Home Medications:  Allergies as of 01/16/2024       Reactions   Percocet [oxycodone -acetaminophen ] Other (See Comments)   Arms and legs go numb   Sulfa Antibiotics Nausea And Vomiting        Medication List        Accurate as of January 16, 2024  2:15 PM. If you have any questions, ask your nurse or doctor.          STOP taking these medications    ciprofloxacin  500 MG tablet Commonly known as: Cipro  Stopped by: Glendia JAYSON Barba   Zinc 22.5 MG Tabs Stopped by: Glendia JAYSON Barba       TAKE these medications    Ajovy 225 MG/1.5ML Soaj Generic drug: Fremanezumab-vfrm Inject 1 Dose into the skin every 30 (thirty) days.   CALCIUM 1200 PO Take 1 tablet by mouth daily.   hydrochlorothiazide  12.5 MG capsule Commonly known as: Microzide  Take 1 capsule (12.5 mg total) by mouth daily.   HYDROcodone -acetaminophen  5-325 MG tablet Commonly known as: NORCO/VICODIN Take 1 tablet by mouth every 6 (six) hours as needed for moderate pain (pain score 4-6).   ibuprofen  200 MG tablet Commonly known as: ADVIL  Take 400 mg by mouth every 6 (six) hours as needed for headache or moderate pain.   imipramine  50 MG tablet Commonly known as: TOFRANIL  Take 50 mg by mouth at bedtime.   meloxicam 7.5 MG tablet Commonly known as: MOBIC Take 7.5 mg by mouth daily.   metoprolol  succinate 100 MG 24 hr tablet Commonly known as: TOPROL -XL Take 100 mg by mouth at bedtime.   MULTIVITAMIN PO Take 1 tablet by mouth daily.   omeprazole 20 MG capsule Commonly known as: PRILOSEC Take 20 mg by mouth every morning.   ondansetron  4 MG disintegrating tablet Commonly known as: ZOFRAN -ODT Take 1 tablet (4 mg total) by mouth every 8 (eight) hours as needed for nausea or vomiting.   rizatriptan 10 MG tablet Commonly known as: MAXALT Take 10 mg by mouth as needed for migraine.   venlafaxine  XR 150 MG 24 hr capsule Commonly known as: EFFEXOR -XR TAKE 1 CAPSULE BY  MOUTH EVERY DAY WITH BREAKFAST What changed: See the new instructions.   vitamin C 100 MG tablet Take 500 mg by mouth daily.        Allergies:  Allergies  Allergen Reactions   Percocet [Oxycodone -Acetaminophen ] Other (See Comments)    Arms and legs go numb   Sulfa Antibiotics Nausea And Vomiting    Family History: Family History  Problem Relation Age of Onset   Hyperlipidemia Mother    Hypertension Mother    Depression Mother    Colon cancer Mother 108   Alcohol abuse Father    Hyperlipidemia Father    Stroke Father    Hypertension Father    Depression Father    Heart disease Father    Prostate cancer Father 42   Arthritis Maternal Grandmother    Hypertension Maternal Grandmother    Depression Maternal Grandmother    Arthritis Maternal Grandfather    Stroke Maternal Grandfather    Hypertension Maternal Grandfather    Depression Maternal Grandfather    Hypertension Paternal Grandmother    Depression Paternal Grandmother    Hypertension Paternal Grandfather    Depression Paternal Grandfather  Leukemia Maternal Aunt        d <50   Cancer Paternal Aunt        unsure types    Social History:  reports that she has never smoked. She has never used smokeless tobacco. She reports current alcohol use. She reports that she does not use drugs.   Physical Exam: BP (!) 123/59   Pulse 75   Ht 5' 4 (1.626 m)   Wt 172 lb (78 kg)   BMI 29.52 kg/m   Constitutional:  Alert, No acute distress. HEENT: [place] AT Respiratory: Normal respiratory effort, no increased work of breathing. Psychiatric: Normal mood and affect.   Pertinent Imaging: Ultrasound images were personally reviewed and interpreted  Ultrasound renal complete  Narrative CLINICAL DATA:  Follow-up examination for acute knee stone.  EXAM: RENAL / URINARY TRACT ULTRASOUND COMPLETE  COMPARISON:  Comparison made with radiograph from 11/24/2023 as well as prior CT from 07/27/2023.  FINDINGS: Right  Kidney:  Renal measurements: 11.0 x 5.3 x 5.5 cm = volume: 167.0 mL. Renal echogenicity within normal limits. Echogenic appearance of the renal pyramids, consistent with medullary nephrocalcinosis. No other shadowing echogenic stones visible by ultrasound. No hydronephrosis. 1.6 x 1.4 x 1.3 cm simple cyst present at the lower pole.  Left Kidney:  Renal measurements: 11.8 x 4.5 x 4.5 cm = volume: 125.0 mL. Renal echogenicity within normal limits. Echogenic appearance of the renal pyramids, consistent with medullary nephrocalcinosis. No other discrete echogenic stones evident by sonography. No hydronephrosis. 1.3 x 1.4 x 1.2 cm simple cyst present at the lower pole. 0.9 x 0.9 x 0.8 cm simple cyst also present at the lower pole.  Bladder:  Appears normal for degree of bladder distention. Both jets are visualized.  Other:  None.  IMPRESSION: 1. Echogenic appearance of the renal pyramids, consistent with medullary nephrocalcinosis. No other discrete stones evident by sonography. No hydronephrosis. 2. Simple bilateral renal cysts measuring up to 1.6 cm on the right and 1.4 cm on the left. These are benign in appearance, with no follow-up imaging recommended regarding these lesions.   Electronically Signed By: Morene Hoard M.D. On: 01/07/2024 04:30   Assessment & Plan:    1.  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  She will be notified with the results  2.  Unaware incontinence Has noted post ureteroscopy PVR today 161 mL Schedule cystoscopy   Glendia JAYSON Barba, MD  [facility] [address] [place], [place] [address] [phone]

## 2024-01-17 ENCOUNTER — Encounter: Payer: Self-pay | Admitting: Urology

## 2024-01-19 ENCOUNTER — Encounter: Payer: Self-pay | Admitting: Urology

## 2024-02-27 ENCOUNTER — Encounter: Payer: Self-pay | Admitting: Urology

## 2024-02-27 ENCOUNTER — Other Ambulatory Visit: Admitting: Urology

## 2024-03-03 ENCOUNTER — Ambulatory Visit
Admission: EM | Admit: 2024-03-03 | Discharge: 2024-03-03 | Disposition: A | Discharge status: Home / Self Care | Attending: Emergency Medicine | Admitting: Emergency Medicine

## 2024-03-03 ENCOUNTER — Other Ambulatory Visit: Payer: Self-pay

## 2024-03-03 DIAGNOSIS — U071 COVID-19: Secondary | ICD-10-CM | POA: Diagnosis not present

## 2024-03-03 LAB — POC COVID19/FLU A&B COMBO
Covid Antigen, POC: POSITIVE — AB
Influenza A Antigen, POC: NEGATIVE
Influenza B Antigen, POC: NEGATIVE

## 2024-03-03 LAB — POCT RAPID STREP A (OFFICE): Rapid Strep A Screen: NEGATIVE

## 2024-03-03 MED ORDER — ACETAMINOPHEN 325 MG PO TABS
650.0000 mg | ORAL_TABLET | Freq: Once | ORAL | Status: AC
  Administered 2024-03-03: 650 mg via ORAL

## 2024-03-03 MED ORDER — ONDANSETRON 4 MG PO TBDP: 4.0000 mg | ORAL_TABLET | Freq: Three times a day (TID) | ORAL | 0 refills | Status: AC | PRN

## 2024-03-03 NOTE — Discharge Instructions (Addendum)
 Your COVID test is positive.  Flu and strep test are negative.    You were given Tylenol  here for your fever.  Take Tylenol  or ibuprofen  as needed for fever or discomfort.    Take Zofran  as directed for nausea.  Keep yourself hydrated with clear liquids such as water .    Follow up with your primary care provider.  Go to the emergency department if you have worsening symptoms.

## 2024-03-03 NOTE — ED Provider Notes (Signed)
 " CAY RALPH PELT    CSN: [id number] Arrival date & time: 03/03/24  1014      History   Chief Complaint Chief Complaint  Patient presents with   Nasal Congestion   Cough   Generalized Body Aches    HPI Tiffany Acevedo is a 59 y.o. female.  Patient presents with fever, body aches, congestion, sore throat, cough, nausea since yesterday.  No OTC medications taken today; took Motrin  last night.  No shortness of breath, vomiting, diarrhea.  The history is provided by the patient and medical records.    Past Medical History:  Diagnosis Date   Allergy    Anxiety    Breast cancer (HCC)    Depression    Excessive weight gain 06/30/2016   Family history of colon cancer    Family history of colon cancer    Family history of prostate cancer    Frequent headaches    GERD (gastroesophageal reflux disease)    History of kidney stones    Hypertension    Migraines    Obesity    Pre-diabetes     Patient Active Problem List   Diagnosis Date Noted   Postmenopausal bleeding 10/29/2020   Breast asymmetry following reconstructive surgery 03/25/2020   S/P breast reconstruction, bilateral 03/06/2020   Sebaceous cyst 12/31/2019   Closed fracture of proximal end of left humerus 12/03/2019   Broken humerus 12/02/2019   Breast wound 10/05/2019   Acquired absence of bilateral breasts and nipples 09/04/2019   Genetic testing 08/10/2019   Monoallelic mutation of CHEK2 gene in female patient 08/07/2019   Malignant neoplasm of upper-outer quadrant of right breast in female, estrogen receptor positive (HCC) 07/30/2019   Goals of care, counseling/discussion 07/30/2019   Family history of prostate cancer    Family history of colon cancer    Breast cancer (HCC) 07/25/2019   Left lower quadrant pain 05/23/2018   Personal history of kidney stones 04/20/2018   Microscopic hematuria 04/20/2018   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 01/22/2017   Overweight (BMI 25.0-29.9)  01/22/2017   Encounter for preventive health examination 06/30/2016   Renal colic 04/12/2015   Ureteral calculus 04/12/2015   Nephrolithiasis 03/04/2015   Intractable migraine with aura without status migrainosus 10/29/2013   Lack of libido 06/24/2013   Migraine syndrome 03/11/2013   Generalized anxiety disorder 03/11/2013    Past Surgical History:  Procedure Laterality Date   BREAST BIOPSY Right 07/13/2019   stereo biopsy/ x clip/path pending   BREAST BIOPSY Right 07/13/2019   stereo biopsy/coil clip/ path pending   BREAST RECONSTRUCTION WITH PLACEMENT OF TISSUE EXPANDER AND FLEX HD (ACELLULAR HYDRATED DERMIS) Bilateral 08/27/2019   Procedure: BILATERAL BREAST RECONSTRUCTION WITH PLACEMENT OF TISSUE EXPANDER AND FLEX HD (ACELLULAR HYDRATED DERMIS);  Surgeon: Lowery Estefana RAMAN, DO;  Location: [facility];  Service: Plastics;  Laterality: Bilateral;   CESAREAN SECTION  1994, 2004, 2013   COLONOSCOPY  2015   CYSTOSCOPY W/ RETROGRADES Right 09/13/2018   Procedure: CYSTOSCOPY WITH RETROGRADE PYELOGRAM;  Surgeon: Twylla Glendia BROCKS, MD;  Location: [facility];  Service: Urology;  Laterality: Right;   CYSTOSCOPY/URETEROSCOPY/HOLMIUM LASER/STENT PLACEMENT Left 05/09/2018   Procedure: CYSTOSCOPY/URETEROSCOPY/HOLMIUM LASER/STENT PLACEMENT;  Surgeon: Twylla Glendia BROCKS, MD;  Location: [facility];  Service: Urology;  Laterality: Left;   CYSTOSCOPY/URETEROSCOPY/HOLMIUM LASER/STENT PLACEMENT Right 09/13/2018   Procedure: CYSTOSCOPY/URETEROSCOPY/HOLMIUM LASER/STENT PLACEMENT;  Surgeon: Twylla Glendia BROCKS, MD;  Location: [facility];  Service: Urology;  Laterality: Right;   CYSTOSCOPY/URETEROSCOPY/HOLMIUM LASER/STENT PLACEMENT Left 12/06/2023  Procedure: CYSTOSCOPY/URETEROSCOPY/HOLMIUM LASER/STENT PLACEMENT;  Surgeon: Georganne Penne SAUNDERS, MD;  Location: [facility];  Service: Urology;  Laterality: Left;   INCONTINENCE SURGERY     BLADDER TUCK   KIDNEY STONE SURGERY Left    November 2025   LIPOSUCTION WITH  LIPOFILLING Bilateral 05/05/2020   Procedure: LIPOSUCTION WITH LIPOFILLING; LIPOSUCTION OF EXCESS LATERAL BREAST TISSUE;  Surgeon: Lowery Estefana RAMAN, DO;  Location: [facility];  Service: Plastics;  Laterality: Bilateral;  90 min   PILONIDAL CYST EXCISION     REMOVAL OF BILATERAL TISSUE EXPANDERS WITH PLACEMENT OF BILATERAL BREAST IMPLANTS Bilateral 01/02/2020   Procedure: REMOVAL OF BILATERAL TISSUE EXPANDERS WITH PLACEMENT OF BILATERAL BREAST IMPLANTS;  Surgeon: Lowery Estefana RAMAN, DO;  Location: [facility];  Service: Plastics;  Laterality: Bilateral;  2 hours, please   STONE EXTRACTION WITH BASKET Right 09/13/2018   Procedure: STONE EXTRACTION WITH BASKET;  Surgeon: Twylla Glendia BROCKS, MD;  Location: [facility];  Service: Urology;  Laterality: Right;   TOTAL MASTECTOMY Bilateral 08/27/2019   Procedure: TOTAL MASTECTOMY;  Surgeon: Rodolph Romano, MD;  Location: [facility];  Service: General;  Laterality: Bilateral;  START @930  DUE TO SENTINEL NODE   UPPER GI ENDOSCOPY      OB History   No obstetric history on file.      Home Medications    Prior to Admission medications  Medication Sig Start Date End Date Taking? Authorizing Provider  ondansetron  (ZOFRAN -ODT) 4 MG disintegrating tablet Take 1 tablet (4 mg total) by mouth every 8 (eight) hours as needed for nausea or vomiting. 03/03/24  Yes Corlis Burnard DEL, NP  Ascorbic Acid (VITAMIN C) 100 MG tablet Take 500 mg by mouth daily.    [provider]  Calcium Carbonate-Vit D-Min (CALCIUM 1200 PO) Take 1 tablet by mouth daily.     [provider]  Fremanezumab-vfrm (AJOVY) 225 MG/1.5ML SOAJ Inject 1 Dose into the skin every 30 (thirty) days.    [provider]  hydrochlorothiazide  (MICROZIDE ) 12.5 MG capsule Take 1 capsule (12.5 mg total) by mouth daily. 01/20/17   Marylynn Verneita CROME, MD  HYDROcodone -acetaminophen  (NORCO/VICODIN) 5-325 MG tablet Take 1 tablet by mouth every 6 (six) hours  as needed for moderate pain (pain score 4-6). 08/19/23   Stoioff, Glendia BROCKS, MD  ibuprofen  (ADVIL ,MOTRIN ) 200 MG tablet Take 400 mg by mouth every 6 (six) hours as needed for headache or moderate pain.     [provider]  imipramine  (TOFRANIL ) 50 MG tablet Take 50 mg by mouth at bedtime.  02/28/13   [provider]  meloxicam (MOBIC) 7.5 MG tablet Take 7.5 mg by mouth daily. 07/19/23   [provider]  metoprolol  succinate (TOPROL -XL) 100 MG 24 hr tablet Take 100 mg by mouth at bedtime.    [provider]  Multiple Vitamin (MULTIVITAMIN PO) Take 1 tablet by mouth daily.    [provider]  omeprazole (PRILOSEC) 20 MG capsule Take 20 mg by mouth every morning. 04/07/18   [provider]  rizatriptan (MAXALT) 10 MG tablet Take 10 mg by mouth as needed for migraine. 02/02/13   [provider]  venlafaxine  XR (EFFEXOR -XR) 150 MG 24 hr capsule TAKE 1 CAPSULE BY MOUTH EVERY DAY WITH BREAKFAST Patient taking differently: 150 mg daily with breakfast. 01/16/18   Marylynn Verneita CROME, MD    Family History Family History  Problem Relation Age of Onset   Hyperlipidemia Mother    Hypertension Mother    Depression Mother  Colon cancer Mother 68   Alcohol abuse Father    Hyperlipidemia Father    Stroke Father    Hypertension Father    Depression Father    Heart disease Father    Prostate cancer Father 59   Arthritis Maternal Grandmother    Hypertension Maternal Grandmother    Depression Maternal Grandmother    Arthritis Maternal Grandfather    Stroke Maternal Grandfather    Hypertension Maternal Grandfather    Depression Maternal Grandfather    Hypertension Paternal Grandmother    Depression Paternal Grandmother    Hypertension Paternal Grandfather    Depression Paternal Grandfather    Leukemia Maternal Aunt        d <50   Cancer Paternal Aunt        unsure types    Social History Social History[1]   Allergies   Percocet  [oxycodone -acetaminophen ] and Sulfa antibiotics   Review of Systems Review of Systems  Constitutional:  Positive for fever. Negative for chills.  HENT:  Positive for congestion and sore throat. Negative for ear pain.   Respiratory:  Positive for cough. Negative for shortness of breath.   Gastrointestinal:  Positive for nausea. Negative for abdominal pain, diarrhea and vomiting.     Physical Exam Triage Vital Signs ED Triage Vitals [03/03/24 1024]  Encounter Vitals Group     BP      Girls Systolic BP Percentile      Girls Diastolic BP Percentile      Boys Systolic BP Percentile      Boys Diastolic BP Percentile      Pulse      Resp      Temp      Temp src      SpO2      Weight      Height      Head Circumference      Peak Flow      Pain Score 7     Pain Loc      Pain Education      Exclude from Growth Chart    No data found.  Updated Vital Signs BP 130/82 (BP Location: Left Arm)   Pulse (!) 111   Temp (!) 102.8 F (39.3 C) (Oral)   Resp 20   SpO2 94%   Visual Acuity Right Eye Distance:   Left Eye Distance:   Bilateral Distance:    Right Eye Near:   Left Eye Near:    Bilateral Near:     Physical Exam Constitutional:      General: She is not in acute distress. HENT:     Right Ear: Tympanic membrane normal.     Left Ear: Tympanic membrane normal.     Nose: Congestion and rhinorrhea present.     Mouth/Throat:     Mouth: Mucous membranes are moist.     Pharynx: Oropharynx is clear.  Cardiovascular:     Rate and Rhythm: Normal rate and regular rhythm.     Heart sounds: Normal heart sounds.  Pulmonary:     Effort: Pulmonary effort is normal. No respiratory distress.     Breath sounds: Normal breath sounds.  Neurological:     Mental Status: She is alert.      UC Treatments / Results  Labs (all labs ordered are listed, but only abnormal results are displayed) Labs Reviewed  POC COVID19/FLU A&B COMBO - Abnormal; Notable for the following  components:      Result Value   Covid Antigen,  POC Positive (*)    All other components within normal limits  POCT RAPID STREP A (OFFICE)    EKG   Radiology No results found.  Procedures Procedures (including critical care time)  Medications Ordered in UC Medications  acetaminophen  (TYLENOL ) tablet 650 mg (650 mg Oral Given 03/03/24 1040)    Initial Impression / Assessment and Plan / UC Course  I have reviewed the triage vital signs and the nursing notes.  Pertinent labs & imaging results that were available during my care of the patient were reviewed by me and considered in my medical decision making (see chart for details).   COVID-19.  COVID test is positive.  Strep and flu are negative.  Patient declines antiviral medication for COVID.  Tylenol  given here for fever as patient had not taken any OTC medications today.  Discussed symptomatic treatment for COVID, including Tylenol  or ibuprofen  as needed, rest, hydration.  Zofran  sent to pharmacy for nausea.  Patient has not had any vomiting or diarrhea.  Education provided on COVID.  ED precautions given.  Instructed her to follow-up with her PCP.  She agrees to plan of care.  Final Clinical Impressions(s) / UC Diagnoses   Final diagnoses:  COVID-19     Discharge Instructions      Your COVID test is positive.  Flu and strep test are negative.    You were given Tylenol  here for your fever.  Take Tylenol  or ibuprofen  as needed for fever or discomfort.    Take Zofran  as directed for nausea.  Keep yourself hydrated with clear liquids such as water .    Follow up with your primary care provider.  Go to the emergency department if you have worsening symptoms.        ED Prescriptions     Medication Sig Dispense Auth. Provider   ondansetron  (ZOFRAN -ODT) 4 MG disintegrating tablet Take 1 tablet (4 mg total) by mouth every 8 (eight) hours as needed for nausea or vomiting. 20 tablet Corlis Burnard DEL, NP      PDMP not reviewed  this encounter.    [1]  Social History Tobacco Use   Smoking status: Never   Smokeless tobacco: Never  Vaping Use   Vaping status: Never Used  Substance Use Topics   Alcohol use: Yes    Comment: glass of wine occassionally   Drug use: No     Corlis Burnard DEL, NP 03/03/24 1056  "

## 2024-03-03 NOTE — ED Triage Notes (Addendum)
 Pt being seen in UC for cough, body aches, nasal congestion, and possible fever x1 day. 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 Pt reports some nausea and mild sore throat. Pt denies V/D. Pt reports taking motrin  last night with no relief.

## 2024-10-24 ENCOUNTER — Ambulatory Visit: Admitting: Oncology
